# Patient Record
Sex: Female | Born: 1945
Health system: Southern US, Community
[De-identification: ages and names within clinical notes are randomized; demographics above are authoritative.]

## PROBLEM LIST (undated history)

## (undated) DIAGNOSIS — R569 Unspecified convulsions: Secondary | ICD-10-CM

## (undated) DIAGNOSIS — E785 Hyperlipidemia, unspecified: Secondary | ICD-10-CM

## (undated) DIAGNOSIS — F329 Major depressive disorder, single episode, unspecified: Secondary | ICD-10-CM

## (undated) DIAGNOSIS — F17200 Nicotine dependence, unspecified, uncomplicated: Secondary | ICD-10-CM

## (undated) DIAGNOSIS — K7689 Other specified diseases of liver: Secondary | ICD-10-CM

## (undated) DIAGNOSIS — F32A Depression, unspecified: Secondary | ICD-10-CM

## (undated) DIAGNOSIS — T7840XA Allergy, unspecified, initial encounter: Secondary | ICD-10-CM

## (undated) DIAGNOSIS — F419 Anxiety disorder, unspecified: Secondary | ICD-10-CM

## (undated) DIAGNOSIS — G40909 Epilepsy, unspecified, not intractable, without status epilepticus: Secondary | ICD-10-CM

## (undated) DIAGNOSIS — M1990 Unspecified osteoarthritis, unspecified site: Secondary | ICD-10-CM

## (undated) DIAGNOSIS — J449 Chronic obstructive pulmonary disease, unspecified: Secondary | ICD-10-CM

## (undated) DIAGNOSIS — D62 Acute posthemorrhagic anemia: Secondary | ICD-10-CM

## (undated) DIAGNOSIS — Z2989 Encounter for other specified prophylactic measures: Secondary | ICD-10-CM

## (undated) DIAGNOSIS — I10 Essential (primary) hypertension: Secondary | ICD-10-CM

## (undated) DIAGNOSIS — M797 Fibromyalgia: Secondary | ICD-10-CM

## (undated) DIAGNOSIS — F1910 Other psychoactive substance abuse, uncomplicated: Secondary | ICD-10-CM

## (undated) DIAGNOSIS — D72829 Elevated white blood cell count, unspecified: Secondary | ICD-10-CM

## (undated) DIAGNOSIS — I69319 Unspecified symptoms and signs involving cognitive functions following cerebral infarction: Secondary | ICD-10-CM

## (undated) DIAGNOSIS — H539 Unspecified visual disturbance: Secondary | ICD-10-CM

## (undated) DIAGNOSIS — N179 Acute kidney failure, unspecified: Secondary | ICD-10-CM

## (undated) DIAGNOSIS — F1010 Alcohol abuse, uncomplicated: Secondary | ICD-10-CM

## (undated) DIAGNOSIS — I219 Acute myocardial infarction, unspecified: Secondary | ICD-10-CM

## (undated) DIAGNOSIS — G709 Myoneural disorder, unspecified: Secondary | ICD-10-CM

## (undated) HISTORY — DX: Acute myocardial infarction, unspecified: I21.9

## (undated) HISTORY — DX: Alcohol abuse, uncomplicated: F10.10

## (undated) HISTORY — DX: Myoneural disorder, unspecified: G70.9

## (undated) HISTORY — DX: Other specified diseases of liver: K76.89

## (undated) HISTORY — DX: Major depressive disorder, single episode, unspecified: F32.9

## (undated) HISTORY — DX: Epilepsy, unspecified, not intractable, without status epilepticus: G40.909

## (undated) HISTORY — DX: Elevated white blood cell count, unspecified: D72.829

## (undated) HISTORY — DX: Anxiety disorder, unspecified: F41.9

## (undated) HISTORY — DX: Nicotine dependence, unspecified, uncomplicated: F17.200

## (undated) HISTORY — DX: Depression, unspecified: F32.A

## (undated) HISTORY — DX: Unspecified visual disturbance: H53.9

## (undated) HISTORY — PX: ELBOW ARTHROPLASTY: SHX928

## (undated) HISTORY — DX: Other psychoactive substance abuse, uncomplicated: F19.10

## (undated) HISTORY — DX: Unspecified convulsions: R56.9

## (undated) HISTORY — DX: Allergy, unspecified, initial encounter: T78.40XA

## (undated) HISTORY — DX: Hyperlipidemia, unspecified: E78.5

## (undated) HISTORY — PX: MOUTH SURGERY: SHX715

---

## 1998-04-23 ENCOUNTER — Encounter: Payer: Self-pay | Admitting: Emergency Medicine

## 1998-04-23 ENCOUNTER — Emergency Department (HOSPITAL_COMMUNITY): Admission: EM | Admit: 1998-04-23 | Discharge: 1998-04-23 | Payer: Self-pay | Admitting: Emergency Medicine

## 1999-04-19 ENCOUNTER — Encounter: Payer: Self-pay | Admitting: Emergency Medicine

## 1999-04-19 ENCOUNTER — Emergency Department (HOSPITAL_COMMUNITY): Admission: EM | Admit: 1999-04-19 | Discharge: 1999-04-19 | Payer: Self-pay

## 2001-03-31 ENCOUNTER — Emergency Department (HOSPITAL_COMMUNITY): Admission: EM | Admit: 2001-03-31 | Discharge: 2001-03-31 | Payer: Self-pay | Admitting: Emergency Medicine

## 2001-03-31 ENCOUNTER — Encounter: Payer: Self-pay | Admitting: Family Medicine

## 2001-03-31 ENCOUNTER — Ambulatory Visit (HOSPITAL_COMMUNITY): Admission: RE | Admit: 2001-03-31 | Discharge: 2001-03-31 | Payer: Self-pay | Admitting: Family Medicine

## 2001-04-08 ENCOUNTER — Ambulatory Visit (HOSPITAL_COMMUNITY): Admission: RE | Admit: 2001-04-08 | Discharge: 2001-04-08 | Payer: Self-pay | Admitting: Family Medicine

## 2001-04-08 ENCOUNTER — Encounter: Payer: Self-pay | Admitting: Family Medicine

## 2001-10-24 ENCOUNTER — Encounter: Payer: Self-pay | Admitting: Family Medicine

## 2001-10-24 ENCOUNTER — Ambulatory Visit (HOSPITAL_COMMUNITY): Admission: RE | Admit: 2001-10-24 | Discharge: 2001-10-24 | Payer: Self-pay | Admitting: Family Medicine

## 2001-12-23 ENCOUNTER — Inpatient Hospital Stay (HOSPITAL_COMMUNITY): Admission: EM | Admit: 2001-12-23 | Discharge: 2001-12-25 | Payer: Self-pay | Admitting: Emergency Medicine

## 2001-12-23 ENCOUNTER — Encounter: Payer: Self-pay | Admitting: Emergency Medicine

## 2001-12-24 ENCOUNTER — Encounter: Payer: Self-pay | Admitting: Internal Medicine

## 2002-03-24 ENCOUNTER — Emergency Department (HOSPITAL_COMMUNITY): Admission: EM | Admit: 2002-03-24 | Discharge: 2002-03-24 | Payer: Self-pay | Admitting: Emergency Medicine

## 2002-04-21 ENCOUNTER — Ambulatory Visit (HOSPITAL_COMMUNITY): Admission: RE | Admit: 2002-04-21 | Discharge: 2002-04-21 | Payer: Self-pay | Admitting: Family Medicine

## 2002-04-21 ENCOUNTER — Encounter: Payer: Self-pay | Admitting: Family Medicine

## 2003-01-14 ENCOUNTER — Emergency Department (HOSPITAL_COMMUNITY): Admission: EM | Admit: 2003-01-14 | Discharge: 2003-01-14 | Payer: Self-pay | Admitting: Emergency Medicine

## 2003-01-29 ENCOUNTER — Encounter: Payer: Self-pay | Admitting: *Deleted

## 2003-01-29 ENCOUNTER — Encounter (HOSPITAL_COMMUNITY): Admission: RE | Admit: 2003-01-29 | Discharge: 2003-02-28 | Payer: Self-pay | Admitting: *Deleted

## 2003-05-11 ENCOUNTER — Ambulatory Visit (HOSPITAL_COMMUNITY): Admission: RE | Admit: 2003-05-11 | Discharge: 2003-05-11 | Payer: Self-pay | Admitting: Unknown Physician Specialty

## 2003-05-11 ENCOUNTER — Encounter: Payer: Self-pay | Admitting: Unknown Physician Specialty

## 2003-05-24 ENCOUNTER — Emergency Department (HOSPITAL_COMMUNITY): Admission: EM | Admit: 2003-05-24 | Discharge: 2003-05-24 | Payer: Self-pay | Admitting: Emergency Medicine

## 2003-06-11 ENCOUNTER — Emergency Department (HOSPITAL_COMMUNITY): Admission: EM | Admit: 2003-06-11 | Discharge: 2003-06-11 | Payer: Self-pay | Admitting: Emergency Medicine

## 2003-07-26 ENCOUNTER — Emergency Department (HOSPITAL_COMMUNITY): Admission: EM | Admit: 2003-07-26 | Discharge: 2003-07-26 | Payer: Self-pay | Admitting: Emergency Medicine

## 2004-05-24 ENCOUNTER — Ambulatory Visit (HOSPITAL_COMMUNITY): Admission: RE | Admit: 2004-05-24 | Discharge: 2004-05-24 | Payer: Self-pay | Admitting: Unknown Physician Specialty

## 2005-06-29 ENCOUNTER — Ambulatory Visit (HOSPITAL_COMMUNITY): Admission: RE | Admit: 2005-06-29 | Discharge: 2005-06-29 | Payer: Self-pay | Admitting: Emergency Medicine

## 2005-08-20 DIAGNOSIS — I219 Acute myocardial infarction, unspecified: Secondary | ICD-10-CM

## 2005-08-20 HISTORY — DX: Acute myocardial infarction, unspecified: I21.9

## 2005-12-13 ENCOUNTER — Emergency Department (HOSPITAL_COMMUNITY): Admission: EM | Admit: 2005-12-13 | Discharge: 2005-12-13 | Payer: Self-pay | Admitting: Emergency Medicine

## 2006-07-08 ENCOUNTER — Ambulatory Visit (HOSPITAL_COMMUNITY): Admission: RE | Admit: 2006-07-08 | Discharge: 2006-07-08 | Payer: Self-pay | Admitting: Emergency Medicine

## 2006-10-15 ENCOUNTER — Emergency Department (HOSPITAL_COMMUNITY): Admission: EM | Admit: 2006-10-15 | Discharge: 2006-10-15 | Payer: Self-pay | Admitting: Family Medicine

## 2007-05-27 ENCOUNTER — Ambulatory Visit (HOSPITAL_COMMUNITY): Admission: RE | Admit: 2007-05-27 | Discharge: 2007-05-27 | Payer: Self-pay | Admitting: Internal Medicine

## 2007-08-11 ENCOUNTER — Ambulatory Visit (HOSPITAL_COMMUNITY): Admission: RE | Admit: 2007-08-11 | Discharge: 2007-08-11 | Payer: Self-pay | Admitting: Internal Medicine

## 2007-08-11 ENCOUNTER — Encounter (HOSPITAL_COMMUNITY): Admission: RE | Admit: 2007-08-11 | Discharge: 2007-08-20 | Payer: Self-pay | Admitting: Internal Medicine

## 2008-05-03 ENCOUNTER — Other Ambulatory Visit: Admission: RE | Admit: 2008-05-03 | Discharge: 2008-05-03 | Payer: Self-pay | Admitting: Internal Medicine

## 2008-12-15 ENCOUNTER — Ambulatory Visit (HOSPITAL_COMMUNITY): Admission: RE | Admit: 2008-12-15 | Discharge: 2008-12-15 | Payer: Self-pay | Admitting: Internal Medicine

## 2010-03-30 ENCOUNTER — Ambulatory Visit (HOSPITAL_COMMUNITY): Admission: RE | Admit: 2010-03-30 | Discharge: 2010-03-30 | Payer: Self-pay | Admitting: Gastroenterology
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
## 2011-01-05 NOTE — Consult Note (Signed)
[facility]  Patient:    AMBUR, PROVINCE Visit Number: [id number] MRN: [id number]          Service Type: MED Location: 2A A228 01 Attending Physician:  Renne Musca Dictated by:   Beryle Beams, M.D. Proc. Date: 12/24/01 Admit Date:  12/23/2001 Discharge Date: 12/25/2001                            Consultation Report  IMPRESSION: 1. I suspect that her severe episodic headaches are causing her fainting    spells.  I doubt these spells are epileptic in nature. 2. Likely significant obstructive sleep apnea syndrome. 3. Insomnia in part due to significant apnea.  RECOMMENDATION: 1. Elavil for insomnia and headache prophylaxis. 2. Sleep study. 3. Consider EEG if the above is unrevealing.  HISTORY:  This is a 65 year old African-American female who reports the onset of history of headaches.  She apparently also has had syncopal/fainting spells 3 years ago.  She was evaluated at [facility] and also had an EEG. Apparently the work-up was unrevealing.  The spells resolved on their own, however, they have returned recently.  She characterizes the semeiology as starting with severe headaches.  Everything then becomes blank and then she passes out.  She is only out for a few seconds.  The spells are not associated with any stiffening or clonic activity.  There is no oral trauma, no urinary incontinence.  She does have a history of closed head injury which was mild most consistent with concussive syndrome.  No intracranial abnormalities.  No history of CNS infections.  She has had syphilis many years ago but was treated twice for this.  She reports her headaches as occurring in the bifrontal, bitemporal region. They can last all day long.  She is taking a lot of short acting analgesics for her headache.  These include aspirin, Motrin, aleve.  Headaches are associated with photophobia and phonophobia.  They are not associated with nausea or  emesis.  They are associated with blurred vision and spots in her vision at times.  There is a family history of headaches and also apparently a son that also has seizures.  PAST MEDICAL HISTORY: 1. Significant for hypertension. 2. Apparently has had chest pain in the past.  CURRENT MEDICATIONS: 1. Norvasc. 2. Xanax. 3. Flexeril 10 mg t.i.d. 4. Protonix. 5. Ambient. 6. Lortab.  SOCIAL HISTORY:  He does admit to using marijuana episodically from time to time.  Does smoke half a pack of cigarettes per day.  Also drinks alcoholic beverages socially.  REVIEW OF SYSTEMS:  As stated in history of present illness.   She apparently does not report a significant history of insomnia.  She has taken Elavil for headache prophylaxis for insomnia.  She apparently has been on Ambien.  She does report a history of day time sleepiness.  She has been told that she snores quite a bit and this is quite disruptive to her family.  PHYSICAL EXAMINATION:  GENERAL:  Moderately overweight lady.  She is no acute distress.  VITAL SIGNS:  Blood pressure 140/80, pulse 66, respirations 12.  She is alert and oriented.  HEENT:  Evaluation shows a very tight retropalatal space and a large tongue, significant.  She does have a significant overbite approximately 10 mm.  EXTREMITIES:  No edema.  NEUROLOGIC:  Cranial nerves II-XII intact including the visual fields.  Motor examination shows normal tone, bulk and strength.  There  is no pronator drift.  Reflexes of ______ and plantar reflexes are both downgoing.  Coordination is intact.  Sensory examination normal to temperature and light touch and also double simultaneous stimulation.  Gait is normal. Dictated by:   Beryle Beams, M.D. Attending Physician:  Renne Musca DD:  12/24/01 TD:  12/26/01 Job: [id number]
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
## 2011-01-05 NOTE — Consult Note (Signed)
   NAME:  Jennifer Jacobs, Jennifer Jacobs                          ACCOUNT NO.:  [id number]   MEDICAL RECORD NO.:  [id number]                   PATIENT TYPE:  EMS   LOCATION:  ED                                   FACILITY:  [facility]   PHYSICIAN:  Tilda Burrow, M.D.              DATE OF BIRTH:  05/15/1946   DATE OF CONSULTATION:  DATE OF DISCHARGE:  03/24/2002                                OB/GYN CONSULTATION   EMERGENCY ROOM CONSULTATION:   CHIEF COMPLAINT:  Swelling in the vaginal area.   HISTORY OF PRESENT ILLNESS:  This 65 year old female, followed frequently,  is seen in the emergency room for severe swelling and discomfort on the left  side of the vaginal entrance.   Additionally she has complaints of an area that is tender and drains near  the rectum.   PHYSICAL EXAMINATION:   FOCUSED EXAM:  Exam shows 3 abnormalities.  Firstly a large 3-4 cm. swollen,  mildly inflamed, Bartholin's abscess on the left labia majora and an  additional perianal draining sinus which has been of chronic nature and  occasionally tries to close over, then swells, becomes tender and ruptures  again.  This has been on long-standing therapy.  She has an external  hemorrhoidal tag, external skin tag.   EMERGENCY ROOM COURSE:  The patient was seen, examined, counseled and I&D of  the left Bartholin's abscess performed by local anesthesia, placement of a  Word catheter after satisfactory drainage.  The procedure was tolerated well  with the patient having received 75 mg of Demerol and 25 of Phenergan prior  to procedure.  ]   DISPOSITION:  The patient will be followed up in 1 week at the [facility]  for catheter removal.  The patient is encouraged to call at this time for a  follow up appointment in approximately 1 month.  She is also advised to have  evaluation of the perianal abscess as well.   If the patient is unable to be seen at the [facility] she is advised to  follow up in our office in approximately 1  month for Word catheter removal.                                               Tilda Burrow, M.D.    JVF/MEDQ  D:  03/24/2002  T:  03/29/2002  Job:  [id number]   cc:   [facility]

## 2011-01-05 NOTE — Group Therapy Note (Signed)
   NAME:  Jennifer Jacobs, Jennifer Jacobs                          ACCOUNT NO.:  [id number]   MEDICAL RECORD NO.:  [id number]                   PATIENT TYPE:  PREC   LOCATION:                                       FACILITY:  [facility]   PHYSICIAN:  Vida Roller, M.D.                DATE OF BIRTH:  July 19, 1946   DATE OF PROCEDURE:  01/29/2003  DATE OF DISCHARGE:  01/14/2003                                    STRESS TEST   PROCEDURE:  Exercise Cardiolite   INDICATIONS:  [name] is a 65 year old female with no known coronary artery  disease who presents with a 2-year history of atypical chest discomfort  associated with multiple episodes of syncope.  Her cardiac risk factors  include tobacco abuse, hypertension, and family history.   BASELINE DATA:  EKG shows sinus rhythm at 57 beats/minute, nonspecific ST  abnormalities. Blood pressure is 140/70.   The patient exercised for a total of 5 minutes and 56 seconds to Bruce  protocol stage 2 and 7.0 METS.  Maximum heart rate achieved was 145  beats/minute which is 87% of predicted maximum heart rate.  Maximum blood  pressure was 158/88.  The patient reported mild shortness of breath towards  the end of exercise which resolved in recovery.   EKG showed no ischemic changes and no arrhythmias.   Final images and results are pending MD review.     Amy Mercy Riding, P.A. [facility]                     Vida Roller, M.D.    AB/MEDQ  D:  01/29/2003  T:  01/29/2003  Job:  [id number]

## 2011-05-25 ENCOUNTER — Inpatient Hospital Stay (INDEPENDENT_AMBULATORY_CARE_PROVIDER_SITE_OTHER)
Admission: RE | Admit: 2011-05-25 | Discharge: 2011-05-25 | Disposition: A | Discharge status: Home / Self Care | Payer: Self-pay | Source: Ambulatory Visit | Attending: Family Medicine | Admitting: Family Medicine

## 2011-05-25 DIAGNOSIS — M940 Chondrocostal junction syndrome [Tietze]: Secondary | ICD-10-CM

## 2011-05-25 DIAGNOSIS — R071 Chest pain on breathing: Secondary | ICD-10-CM

## 2012-01-21 ENCOUNTER — Encounter (HOSPITAL_COMMUNITY): Payer: Self-pay | Admitting: *Deleted

## 2012-01-21 ENCOUNTER — Emergency Department (HOSPITAL_COMMUNITY): Payer: No Typology Code available for payment source

## 2012-01-21 ENCOUNTER — Other Ambulatory Visit: Payer: Self-pay

## 2012-01-21 ENCOUNTER — Encounter (HOSPITAL_COMMUNITY): Payer: Self-pay | Admitting: Emergency Medicine

## 2012-01-21 ENCOUNTER — Emergency Department (HOSPITAL_COMMUNITY)
Admission: EM | Admit: 2012-01-21 | Discharge: 2012-01-21 | Disposition: A | Discharge status: Home / Self Care | Payer: No Typology Code available for payment source | Attending: Emergency Medicine | Admitting: Emergency Medicine

## 2012-01-21 DIAGNOSIS — M25559 Pain in unspecified hip: Secondary | ICD-10-CM | POA: Insufficient documentation

## 2012-01-21 DIAGNOSIS — M549 Dorsalgia, unspecified: Secondary | ICD-10-CM | POA: Insufficient documentation

## 2012-01-21 DIAGNOSIS — M542 Cervicalgia: Secondary | ICD-10-CM | POA: Insufficient documentation

## 2012-01-21 DIAGNOSIS — M25569 Pain in unspecified knee: Secondary | ICD-10-CM | POA: Insufficient documentation

## 2012-01-21 DIAGNOSIS — R404 Transient alteration of awareness: Secondary | ICD-10-CM | POA: Insufficient documentation

## 2012-01-21 DIAGNOSIS — T1490XA Injury, unspecified, initial encounter: Secondary | ICD-10-CM | POA: Insufficient documentation

## 2012-01-21 DIAGNOSIS — R079 Chest pain, unspecified: Secondary | ICD-10-CM | POA: Insufficient documentation

## 2012-01-21 HISTORY — DX: Unspecified osteoarthritis, unspecified site: M19.90

## 2012-01-21 HISTORY — DX: Essential (primary) hypertension: I10

## 2012-01-21 HISTORY — DX: Fibromyalgia: M79.7

## 2012-01-21 MED ORDER — IBUPROFEN 800 MG PO TABS: 800.0000 mg | ORAL_TABLET | Freq: Three times a day (TID) | ORAL | Status: AC

## 2012-01-21 MED ORDER — MELOXICAM 7.5 MG PO TABS: 7.5000 mg | ORAL_TABLET | Freq: Two times a day (BID) | ORAL | Status: DC | PRN

## 2012-01-21 MED ORDER — DIAZEPAM 5 MG PO TABS: 5.0000 mg | ORAL_TABLET | Freq: Every day | ORAL | Status: AC

## 2012-01-21 NOTE — ED Notes (Signed)
Pt returned from X-Ray/CT Scan.Marland Kitchen
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
## 2012-01-21 NOTE — ED Notes (Signed)
Patient transported to CT/XRay.
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
## 2012-01-21 NOTE — ED Notes (Signed)
Pt remains in X-Ray/CT Scan, family at bedside.

## 2013-03-18 ENCOUNTER — Encounter: Payer: Self-pay | Admitting: Cardiology

## 2013-03-18 ENCOUNTER — Ambulatory Visit (INDEPENDENT_AMBULATORY_CARE_PROVIDER_SITE_OTHER): Payer: BC Managed Care – PPO | Admitting: Cardiology

## 2013-03-18 VITALS — BP 138/90 | HR 59 | Ht 68.5 in | Wt 177.8 lb

## 2013-03-18 DIAGNOSIS — E785 Hyperlipidemia, unspecified: Secondary | ICD-10-CM

## 2013-03-18 DIAGNOSIS — R079 Chest pain, unspecified: Secondary | ICD-10-CM

## 2013-03-18 DIAGNOSIS — I739 Peripheral vascular disease, unspecified: Secondary | ICD-10-CM

## 2013-03-18 DIAGNOSIS — F17200 Nicotine dependence, unspecified, uncomplicated: Secondary | ICD-10-CM | POA: Insufficient documentation

## 2013-03-18 DIAGNOSIS — I10 Essential (primary) hypertension: Secondary | ICD-10-CM

## 2013-03-18 DIAGNOSIS — R0600 Dyspnea, unspecified: Secondary | ICD-10-CM

## 2013-03-18 DIAGNOSIS — R42 Dizziness and giddiness: Secondary | ICD-10-CM

## 2013-03-18 DIAGNOSIS — R0609 Other forms of dyspnea: Secondary | ICD-10-CM

## 2013-03-18 NOTE — Assessment & Plan Note (Signed)
The sounds liver orthostatic versus vertigo.  We'll see her echo looks like as far as filling pressures.  Also reluctant to increase her blood pressure control if it is orthostatic in nature.

## 2013-03-18 NOTE — Assessment & Plan Note (Signed)
On statin, followup primary physician.

## 2013-03-18 NOTE — Progress Notes (Addendum)
Patient ID: Jennifer Jacobs, female   DOB: 24-Oct-1945, 66 y.o.   MRN: [id number]  PCP:  [facility], Dr. Lerry Liner Clinic Note: Chief Complaint  Patient presents with  . New Patient    ref from [facility]; CP x 3-4 days assoicated with SOB, lightheaded/dizzy; bilat LE edema    HPI: Jennifer Jacobs is a 67 y.o. female with a PMH below who presents today for evaluation of 3-4 days with chest pain as well as dyspnea and edema.  Interval History: She has hypertension and dyslipidemia, and was up nearly evaluated 2007 to pathology a mild MI.  No invasive evaluation done as far as tell no stress test is in the [facility] record.  This is was performed at [facility].  Her major complaint is of pain started in bilateral shoulders about to 4 days ago.  It occurred at rest and is present consistent and constant since.  He is made worse with moving of her shoulders and deep inspiration.  It is now radiating down into her chest along the right side of her sternum.  Coughing a deep respiration doesn't get worse.  It is not made worse with exertion.  It is a sharp stabbing type pain involving both sides of the neck and shoulder.  Besides this, she also notes intermittently feeling lightheaded and dizzy and has mild bilateral lower extremity edema.  However more concerning lesion she notes that for about a year or so she's been noticing having shortness of breath that is at baseline there, but does get worse with exertion.  He has a mild orthopnea, but does describe symptoms that may be PND with having to wake up sometimes goes to the bathroom a glass of water her face gets her breath.  She denies any syncope or near-syncope but does have some lightheadedness and wooziness this sounds almost positional in how she describes it. She notes being somewhat sleepy during the course of the day and has been told by her husband that she snores quite a bit.  She does note that she avoids  certain physical activities to the CCU in the past.  She does say that she will get short of breath with minimal exertion.  She PTs easily and does note palpitations albeit not minor.  She does have family history coronary disease that she can tell.  She has hypertension, she is current smoker and lives with a smoker.    She denies any melena, hematochezia or hematuria.  She does describe some leg pain and the angulation that may be consistent with claudication symptoms.  No severe near-syncope.  No TIA or amaurosis fugax symptoms.  Past Medical History  Diagnosis Date  . Arthritis   . Hypertension   . Fibromyalgia   . MI (myocardial infarction) 2007    Reportedly - Had TM ST with ?? results, Pt reports.  . Smoker unmotivated to quit     Quit for 3 years and then restarted  . Dyslipidemia     Prior Cardiac Evaluation and Past Surgical History: Past Surgical History  Procedure Laterality Date  . Arm surgery      Allergies  Allergen Reactions  . Aspirin Nausea Only    Current Outpatient Prescriptions  Medication Sig Dispense Refill  . lamoTRIgine (LAMICTAL) 100 MG tablet Take 50 mg by mouth daily.      Marland Kitchen lisinopril-hydrochlorothiazide (PRINZIDE,ZESTORETIC) 20-12.5 MG per tablet Take 1 tablet by mouth daily.      Marland Kitchen  meloxicam (MOBIC) 7.5 MG tablet Take 1 tablet (7.5 mg total) by mouth 2 (two) times daily as needed for pain. For inflammation  15 tablet  0  . metoprolol tartrate (LOPRESSOR) 25 MG tablet Take 25 mg by mouth 2 (two) times daily.      Marland Kitchen omeprazole (PRILOSEC) 20 MG capsule Take 20 mg by mouth daily.      . pravastatin (PRAVACHOL) 40 MG tablet Take 40 mg by mouth every evening.      . traZODone (DESYREL) 100 MG tablet Take 100 mg by mouth at bedtime.       No current facility-administered medications for this visit.    History   Social History  . Marital Status: Legally Separated    Spouse Name: N/A    Number of Children: 4  . Years of Education: N/A    Occupational History  .     Social History Main Topics  . Smoking status: Current Every Day Smoker -- 0.10 packs/day    Types: Cigarettes  . Smokeless tobacco: Never Used     Comment: quit for 3 years, started back 6 months ago  . Alcohol Use: Yes     Comment: 1 pint (gin)/week  . Drug Use: No  . Sexually Active: Not on file   Other Topics Concern  . Not on file   Social History Narrative  . No narrative on file   Family History  Problem Relation Age of Onset  . Heart attack Father   . Heart attack Maternal Grandmother   . Cancer Maternal Grandfather   . Cancer Paternal Grandmother    ROS: A comprehensive Review of Systems - Negative except Pertinent positives noted above.  Other noncardiac symptoms noted below General ROS: positive for  - fatigue and sleep disturbance ENT ROS: positive for - vertigo and Dizziness Hematological and Lymphatic ROS: positive for - fatigue negative for - bleeding problems, blood clots, blood transfusions, jaundice or pallor Gastrointestinal ROS: no abdominal pain, change in bowel habits, or black or bloody stools Musculoskeletal ROS: Positive position bilateral shoulder and neck pain as indicated above.  PHYSICAL EXAM BP 138/90  Pulse 59  Ht 5' 8.5" (1.74 m)  Wt 177 lb 12.8 oz (80.65 kg)  BMI 26.64 kg/m2 General appearance: alert, cooperative, appears stated age and mild truncal obesity.  She readily admits this to her level of comprehension is not very good.  She usually has her daughter here with her to help out with expirations she available his questions appropriately.  Is not really comprehend more detailed questions.  She is otherwise well groomed and in no acute distress. HEENT: [place]/AT, EOMI, MMM, anicteric sclera Neck: no carotid bruit, no JVD and supple, symmetrical, trachea midline Lungs: clear to auscultation bilaterally, normal percussion bilaterally and nonlabored, good movement Heart: regular rate and rhythm, S1, S2 normal, no  murmur, click, rub or gallop and normal apical impulse; palpation along the bilateral costochondral margin reveals exquisite tenderness and pain to palpation in the third or fourth rib costochondral junction.  This causes peripheral pain up the rib toward the axilla.  This exactly reproduces her symptoms. Abdomen: soft, non-tender; bowel sounds normal; no masses,  no organomegaly Extremities: extremities normal, atraumatic, no cyanosis with trace edema and no venous stasis changes Pulses: 2+ and symmetric bilateral radial arteries but somewhat diminished pedal pulses. Neurologic: Mental status: Alert, oriented, thought content appropriate, quite slow with apprehension Cranial nerves: normal; just due to how her face is configured, has appearance of a  left-sided facial droop.  This is if anything a very subtle finding.  [id number] today: Yes Rate: 59 , Rhythm: Sinus bradycardia, nonspecific ST changes, fracture deviation.  Otherwise normal   Recent Labs: None recently checked   Epworth Sleepiness Scale: Situation   Chance of Dozing/Sleeping (0 = never , 1 = slight chance , 2 = moderate chance , 3 = high chance )   sitting and reading  1    watching TV  3    sitting inactive in a public place  1    being a passenger in a motor vehicle for an hour or more  3    lying down in the afternoon  3    sitting and talking to someone  0    sitting quietly after lunch (no alcohol)  0    while stopped for a few minutes in traffic as the driver  2-3    Total Score   13-14     ASSESSMENT/PLAN: Chest pain, musculoskeletal; costochondritis Pain on the right side of the chest is reproducible on exam associated with significant muscle tightness and discomfort in her shoulders.  This is most likely musculoskeletal/costochondritis in nature.  She has history of fibromyalgia, this may very well be related.  Not anginal pain.  Despite that, I am more concerned with her dyspnea on exertion and will evaluate  her with with a stress test to clarify the chest discomfort.  Dyspnea on exertion Exertional dyspnea patient did with moderate truncal obesity albeit not diagnostic for obesity, hypertension and smoking history.  This could be multifactorial.  However I am concerned with the exertional nature of her symptoms that she says is getting worse over last couple months.  Note having symptoms of PND and orthopnea, wonder if she did have elevated filling pressures of diastolic dysfunction related possibly systolic dysfunction versus even pulmonary hypertension.  Also consider could this be due to micro-or microvascular ischemia.  Plan: 2-D echocardiogram, CPET-MET test. Would consider Myoview or cath if CPET is grossly abnormal.  PND (paroxysmal nocturnal dyspnea) Not classic symptoms of PND, however with other complaints, we'll then proceed with echocardiography.  Hypertension She continues to have diastolic hypertension here.  She says it is not like this at home.  We'll see what his comes in for CPET test.  No room to increase beta blocker.  4 increasing lisinopril/HCTZ to max dose, would like to see additional blood pressure readings.  Postural dizziness The sounds liver orthostatic versus vertigo.  We'll see her echo looks like as far as filling pressures.  Also reluctant to increase her blood pressure control if it is orthostatic in nature.  Dyslipidemia On statin, followup primary physician.  Claudication, class I Appendix if this symptom and her next visit.  We'll see she is bothered by the bicycle test on CPET.   Discuss referral to Dr. Tresa Endo for Sleep Study, given Epworth Scale of 13-14   Orders Placed This Encounter  Procedures  . CPET WITH PFT (MET TEST)    Standing Status: Future     Number of Occurrences:      Standing Expiration Date: 03/18/2014  . EKG 12-Lead  . 2D Echocardiogram with contrast    Standing Status: Future     Number of Occurrences:      Standing Expiration  Date: 03/18/2014    Order Specific Question:  Type of Echo    Answer:  Complete    Order Specific Question:  Where should this test be performed  Answer:  [facility]    Order Specific Question:  Reason for exam-Echo    Answer:  Dyspnea  786.09   Followup: One month  DAVID W. Herbie Baltimore, M.D., M.S. [facility] [address] [place], [place]  [address]  [phone] Pager # [phone]

## 2013-03-18 NOTE — Assessment & Plan Note (Signed)
She continues to have diastolic hypertension here.  She says it is not like this at home.  We'll see what his comes in for CPET test.  No room to increase beta blocker.  4 increasing lisinopril/HCTZ to max dose, would like to see additional blood pressure readings.

## 2013-03-18 NOTE — Patient Instructions (Addendum)
It was a pleasure meeting you today. The discomfort you're having your chest and shoulders is most likely not related to your heart.  Is probably musculoskeletal which means that neck part is probably due to muscle discomfort but that pain in the chest is inflammation at the site where your ribs join your breastbone.  We call this condition costochondritis. Would recommend treating this with taking Aleve or ibuprofen 2-3 times a day for least for 5 days, making sure you take it with food on her stomach and drink plenty of fluids.  What concerns him more is the shortness of breath you have is lying down flat and walking around.  With you having cholesterol and high blood pressure, as well as some mild swelling in legs and worried that maybe your heart function is either not appropriate either the pumping or relaxing mode.  I am also wondering if maybe you could have some heart artery disease either in the large vessels or small vessels.  To evaluate this and we plan to do 2 studies: An echocardiogram and a bicycle stress test call us CPET-MET that is a physiologic study of your heart function with exercise while pedaling a bicycle  Your physician has requested that you have an echocardiogram. Echocardiography is a painless test that uses sound waves to create images of your heart. It provides your doctor with information about the size and shape of your heart and how well your heart's chambers and valves are working. This procedure takes approximately one hour. There are no restrictions for this procedure.   Your physician recommends that you schedule a follow-up appointment after test completed

## 2013-03-18 NOTE — Assessment & Plan Note (Signed)
Appendix if this symptom and her next visit.  We'll see she is bothered by the bicycle test on CPET.

## 2013-03-18 NOTE — Assessment & Plan Note (Signed)
Not classic symptoms of PND, however with other complaints, we'll then proceed with echocardiography.

## 2013-03-18 NOTE — Assessment & Plan Note (Signed)
Pain on the right side of the chest is reproducible on exam associated with significant muscle tightness and discomfort in her shoulders.  This is most likely musculoskeletal/costochondritis in nature.  She has history of fibromyalgia, this may very well be related.  Not anginal pain.  Despite that, I am more concerned with her dyspnea on exertion and will evaluate her with with a stress test to clarify the chest discomfort.

## 2013-03-18 NOTE — Assessment & Plan Note (Signed)
Exertional dyspnea patient did with moderate truncal obesity albeit not diagnostic for obesity, hypertension and smoking history.  This could be multifactorial.  However I am concerned with the exertional nature of her symptoms that she says is getting worse over last couple months.  Note having symptoms of PND and orthopnea, wonder if she did have elevated filling pressures of diastolic dysfunction related possibly systolic dysfunction versus even pulmonary hypertension.  Also consider could this be due to micro-or microvascular ischemia.  Plan: 2-D echocardiogram, CPET-MET test. Would consider Myoview or cath if CPET is grossly abnormal.

## 2013-03-25 ENCOUNTER — Ambulatory Visit (HOSPITAL_COMMUNITY)
Admission: RE | Admit: 2013-03-25 | Discharge: 2013-03-25 | Disposition: A | Discharge status: Home / Self Care | Payer: BC Managed Care – PPO | Source: Ambulatory Visit | Attending: Cardiovascular Disease | Admitting: Cardiovascular Disease

## 2013-03-25 DIAGNOSIS — R079 Chest pain, unspecified: Secondary | ICD-10-CM

## 2013-03-25 DIAGNOSIS — R0609 Other forms of dyspnea: Secondary | ICD-10-CM | POA: Insufficient documentation

## 2013-03-25 DIAGNOSIS — R0989 Other specified symptoms and signs involving the circulatory and respiratory systems: Secondary | ICD-10-CM | POA: Insufficient documentation

## 2013-03-25 DIAGNOSIS — I079 Rheumatic tricuspid valve disease, unspecified: Secondary | ICD-10-CM | POA: Insufficient documentation

## 2013-03-25 DIAGNOSIS — R0600 Dyspnea, unspecified: Secondary | ICD-10-CM

## 2013-03-25 DIAGNOSIS — R42 Dizziness and giddiness: Secondary | ICD-10-CM | POA: Insufficient documentation

## 2013-03-25 HISTORY — PX: TRANSTHORACIC ECHOCARDIOGRAM: SHX275

## 2013-03-25 NOTE — Progress Notes (Signed)
2D Echo Performed 03/25/2013    Clearence Ped, RCS

## 2013-03-27 ENCOUNTER — Encounter: Payer: Self-pay | Admitting: Cardiology

## 2013-04-27 ENCOUNTER — Encounter (INDEPENDENT_AMBULATORY_CARE_PROVIDER_SITE_OTHER): Payer: BC Managed Care – PPO

## 2013-04-27 DIAGNOSIS — R0602 Shortness of breath: Secondary | ICD-10-CM

## 2013-04-27 DIAGNOSIS — R079 Chest pain, unspecified: Secondary | ICD-10-CM

## 2013-04-27 DIAGNOSIS — R0600 Dyspnea, unspecified: Secondary | ICD-10-CM

## 2013-04-27 HISTORY — PX: OTHER SURGICAL HISTORY: SHX169

## 2013-05-07 ENCOUNTER — Encounter: Payer: Self-pay | Admitting: Cardiology

## 2013-05-07 ENCOUNTER — Ambulatory Visit (INDEPENDENT_AMBULATORY_CARE_PROVIDER_SITE_OTHER): Payer: BC Managed Care – PPO | Admitting: Cardiology

## 2013-05-07 VITALS — BP 122/82 | HR 63 | Ht 68.5 in | Wt 172.0 lb

## 2013-05-07 DIAGNOSIS — I10 Essential (primary) hypertension: Secondary | ICD-10-CM

## 2013-05-07 DIAGNOSIS — F17200 Nicotine dependence, unspecified, uncomplicated: Secondary | ICD-10-CM

## 2013-05-07 DIAGNOSIS — R0600 Dyspnea, unspecified: Secondary | ICD-10-CM

## 2013-05-07 DIAGNOSIS — R0609 Other forms of dyspnea: Secondary | ICD-10-CM

## 2013-05-07 DIAGNOSIS — I739 Peripheral vascular disease, unspecified: Secondary | ICD-10-CM

## 2013-05-07 DIAGNOSIS — E785 Hyperlipidemia, unspecified: Secondary | ICD-10-CM

## 2013-05-07 DIAGNOSIS — R0789 Other chest pain: Secondary | ICD-10-CM

## 2013-05-08 NOTE — Patient Instructions (Signed)
Your physician has requested that you have a lexiscan myoview. For further information please visit [email or web address] Please follow instruction sheet, as given.       Your physician wants you to follow-up in 4-6 WEEKS.  You will receive a reminder letter in the mail two months in advance. If you don't receive a letter, please call our office to schedule the follow-up appointment.

## 2013-05-09 ENCOUNTER — Encounter: Payer: Self-pay | Admitting: Cardiology

## 2013-05-09 NOTE — Assessment & Plan Note (Signed)
I wondering if some the symptoms she is having may very well be related to sleep apnea. I want to complete the ischemia evaluation, but then will refer to Dr. Tresa Endo for OSA evaluation given her Epworth scale score.

## 2013-05-09 NOTE — Progress Notes (Signed)
Patient ID: MARYAH MARINARO, female   DOB: 02-18-1946, 67 y.o.   MRN: [id number]  PCP:  [facility], Dr. Lerry Liner [facility] Note: Chief Complaint  Patient presents with  . Follow-up    echo and myoview, complains of chest pain, sob, gerd    HPI: DORETHIA JEANMARIE is a 67 y.o. female with a PMH below who presents today for followup evaluation of 3-4 days with chest pain as well as dyspnea and edema.  Please see my initial note for details the.  In the past she was told she had a "heart attack "2007, but has not had any evidence of any workup done. I recently saw her on July 30 to evaluate these symptoms.  She is subsequently had an echocardiogram and a CPET-MET test.  Interval History: Since her last evaluation, she still has exertional dyspnea but she says it occurs when she is "mopping or more in the yard.  She also describes a discomfort in her chest the is really up to the shoulders more the right side than on the left this is probably more consistent with musculoskeletal symptoms.  She notes occasional cough spell at night but really denies any PND, orthopnea or edema. She did the CPET test but felt like she didn't give the best effort because her knees are really bothering her and could not push herself through it.    Cardiovascular ROS: positive for - dyspnea on exertion and And right shoulder/upper chest pain negative for - edema, irregular heartbeat, loss of consciousness, murmur, orthopnea, palpitations, paroxysmal nocturnal dyspnea, rapid heart rate or shortness of breath, lightheadedness/dizziness, syncope/near syncope. Denies any TIA or amaurosis fugax symptoms.  No melena, hematochezia or hematuria.  Past Medical History  Diagnosis Date  . Arthritis   . Hypertension   . Fibromyalgia   . MI (myocardial infarction) 2007    Reportedly - Had TM ST with ?? results, Pt reports.  . Smoker unmotivated to quit     Quit for 3 years and then restarted  . Dyslipidemia     Prior Cardiac Evaluation and Past Surgical History: Past Surgical History  Procedure Laterality Date  . Transthoracic echocardiogram  03/25/2013    EF 55-60%, no regional wall motion abnormalities; normal diastolic parameters, normal pulmonary pressures.  No valvular lesions noted.  Essentially normal echo   . Exercise tolerance test  04/27/2013    CPET-MET: Submaximal effort (0.96, with goal of greater than 1.09) -- patient states that her effort was limited due to knee pain.  This makes the rest of the interpretation difficult: Peak VO2 - 10.5 (59% moderate), peak 02-pulse -  7..11 (low); heart rate 114 (73% -chronic incompetence considered);; PFTs normal     Allergies  Allergen Reactions  . Aspirin Nausea Only    Current Outpatient Prescriptions  Medication Sig Dispense Refill  . aspirin 81 MG tablet Take 81 mg by mouth daily.      Marland Kitchen lamoTRIgine (LAMICTAL) 100 MG tablet Take 50 mg by mouth daily.      Marland Kitchen lisinopril-hydrochlorothiazide (PRINZIDE,ZESTORETIC) 20-12.5 MG per tablet Take 1 tablet by mouth daily.      . meloxicam (MOBIC) 7.5 MG tablet Take 1 tablet (7.5 mg total) by mouth 2 (two) times daily as needed for pain. For inflammation  15 tablet  0  . metoprolol tartrate (LOPRESSOR) 25 MG tablet Take 25 mg by mouth 2 (two) times daily.      Marland Kitchen omeprazole (PRILOSEC) 20 MG capsule Take 20 mg  by mouth daily.      . pravastatin (PRAVACHOL) 40 MG tablet Take 40 mg by mouth every evening.      . traZODone (DESYREL) 100 MG tablet Take 100 mg by mouth at bedtime.       No current facility-administered medications for this visit.    History   Social History  . Marital Status: Legally Separated    Spouse Name: N/A    Number of Children: 4  . Years of Education: N/A   Occupational History  .     Social History Main Topics  . Smoking status: Former Smoker -- 0.10 packs/day    Types: Cigarettes    Quit date: 03/06/2013  . Smokeless tobacco: Never Used  . Alcohol Use: Yes      Comment: 1 pint (gin)/week  . Drug Use: No  . Sexual Activity: Not on file   Other Topics Concern  . Not on file   Social History Narrative   Married for 8 years.  4 children 6 grandchildren.  Lives with her husband.   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  Does drink about 5 alcoholic beverages a week.   Takes Lamictal and trazodone.   Does not exercise due to work and being tired.      Husband: Beverley Fiedler   Daughters: Ane Payment, [place] Ronny Flurry   Family History  Problem Relation Age of Onset  . Heart attack Father   . Heart attack Maternal Grandmother   . Cancer Maternal Grandfather   . Cancer Paternal Grandmother    ROS: A comprehensive Review of Systems - Negative except Pertinent positives noted above.  Other noncardiac symptoms noted below General ROS: positive for  - fatigue and sleep disturbance ENT ROS: positive for - vertigo and Dizziness Hematological and Lymphatic ROS: positive for - fatigue negative for - bleeding problems, blood clots, blood transfusions, jaundice or pallor Gastrointestinal ROS: no abdominal pain, change in bowel habits, or black or bloody stools Musculoskeletal ROS: Positive position bilateral shoulder and neck pain as indicated above.  PHYSICAL EXAM BP 122/82  Pulse 63  Ht 5' 8.5" (1.74 m)  Wt 172 lb (78.019 kg)  BMI 25.77 kg/m2 General appearance: alert, cooperative, appears stated age and mild truncal obesity. She is otherwise well groomed and in no acute distress. HEENT: [place]/AT, EOMI, MMM, anicteric sclera Neck: no carotid bruit, no JVD and supple, symmetrical, trachea midline Lungs: clear to auscultation bilaterally, normal percussion bilaterally and nonlabored, good movement Heart: regular rate and rhythm, S1, S2 normal, no murmur, click, rub or gallop and normal apical impulse; palpation along the bilateral costochondral margin reveals exquisite tenderness and pain to palpation in the third  or fourth rib costochondral junction.  This causes peripheral pain up the rib toward the axilla.  This exactly reproduces her symptoms. Abdomen: soft, non-tender; bowel sounds normal; no masses,  no organomegaly Extremities: extremities normal, atraumatic, no cyanosis with trace edema and no venous stasis changes Pulses: 2+ and symmetric bilateral radial arteries but somewhat diminished pedal pulses.  WUJ:WJXBJYNWG today: Yes Rate: 59 , Rhythm: Sinus bradycardia, nonspecific ST changes, fracture deviation.  Otherwise normal   Recent Labs: None recently checked  Epworth Sleepiness Scale from last visit: Situation   Chance of Dozing/Sleeping (0 = never , 1 = slight chance , 2 = moderate chance , 3 = high chance )   sitting and reading  1    watching TV  3  sitting inactive in a public place  1    being a passenger in a motor vehicle for an hour or more  3    lying down in the afternoon  3    sitting and talking to someone  0    sitting quietly after lunch (no alcohol)  0    while stopped for a few minutes in traffic as the driver  2-3    Total Score   13-14     ASSESSMENT/PLAN: Dyslipidemia She is currently on a statin, and is followed by her primary physician. Would like to see labs from the PCP.  If not I may simply check a set of labs to have in our system. The level of intensity of treatment would be based on results of this stress test.  Claudication, class I I'm not really sure percent history of claudication simply related to her arthritis pain. Consider checking ABIs of Lower Extremity Arterial Dopplers to get a baseline baseline at followup visit.  PND (paroxysmal nocturnal dyspnea) I wondering if some the symptoms she is having may very well be related to sleep apnea. I want to complete the ischemia evaluation, but then will refer to Dr. Tresa Endo for OSA evaluation given her Epworth scale score.  Chest pain, musculoskeletal; costochondritis As I mentioned, and not overly  concerned about the chest discomfort she is having.  This is clearly musculoskeletal  in nature. The reason for the Myoview stress test is more dyspnea on exertion.  Smoker unmotivated to quit I wrote the topic of smoking cessation with her at this time.  But quickly ran into a road block, as she is not interested in discussing it.  Hopefully with the results of the nuclear stress test, I can dovetail that into more discussion of smoking cessation.  DOE (dyspnea on exertion) Unfortunately I was not able to get great data from the CPET test.  While not only concern that her chest discomfort being cardiac in nature, I would like to evaluate the dyspnea as a possible ischemic symptom. Since she is not able to walk on a treadmill, and clearly not able to pedal a bicycle to target heart rate, and likely do a LexiScan Myoview simply because she carries a diagnoses of prior MI.  Hypertension Blood pressure is much better today.  She is on beta blocker as well as ACE inhibitor/HCTZ. Diastolic pressure seems to be improved.  No plans to change her regimen   Discuss referral to Dr. Tresa Endo for Sleep Study, given Epworth Scale of 13-14 when seen in followup   No orders of the defined types were placed in this encounter.   Followup: 4-6 weeks  Locke Barrell W. Herbie Baltimore, M.D., M.S. [facility] [address] [place], [place]  [address]  [phone] Pager # [phone]

## 2013-05-09 NOTE — Assessment & Plan Note (Signed)
She is currently on a statin, and is followed by her primary physician. Would like to see labs from the PCP.  If not I may simply check a set of labs to have in our system. The level of intensity of treatment would be based on results of this stress test.

## 2013-05-09 NOTE — Assessment & Plan Note (Deleted)
Unfortunately I was not able to get great data from the CPET test.  While not only concern that her chest discomfort being cardiac in nature, I would like to evaluate the dyspnea as a possible ischemic symptom. Since she is not able to walk on a treadmill, and clearly not able to pedal a bicycle to target heart rate, and likely do a LexiScan Myoview simply because she carries a diagnoses of prior MI. 

## 2013-05-09 NOTE — Assessment & Plan Note (Signed)
Unfortunately I was not able to get great data from the CPET test.  While not only concern that her chest discomfort being cardiac in nature, I would like to evaluate the dyspnea as a possible ischemic symptom. Since she is not able to walk on a treadmill, and clearly not able to pedal a bicycle to target heart rate, and likely do a LexiScan Myoview simply because she carries a diagnoses of prior MI.

## 2013-05-09 NOTE — Assessment & Plan Note (Signed)
As I mentioned, and not overly concerned about the chest discomfort she is having.  This is clearly musculoskeletal  in nature. The reason for the Myoview stress test is more dyspnea on exertion.

## 2013-05-09 NOTE — Assessment & Plan Note (Signed)
I wrote the topic of smoking cessation with her at this time.  But quickly ran into a road block, as she is not interested in discussing it.  Hopefully with the results of the nuclear stress test, I can dovetail that into more discussion of smoking cessation.

## 2013-05-09 NOTE — Assessment & Plan Note (Signed)
I'm not really sure percent history of claudication simply related to her arthritis pain. Consider checking ABIs of Lower Extremity Arterial Dopplers to get a baseline baseline at followup visit.

## 2013-05-09 NOTE — Assessment & Plan Note (Signed)
Blood pressure is much better today.  She is on beta blocker as well as ACE inhibitor/HCTZ. Diastolic pressure seems to be improved.  No plans to change her regimen

## 2013-05-13 ENCOUNTER — Encounter (HOSPITAL_COMMUNITY): Payer: BC Managed Care – PPO

## 2013-05-19 ENCOUNTER — Ambulatory Visit (HOSPITAL_COMMUNITY)
Admission: RE | Admit: 2013-05-19 | Discharge: 2013-05-19 | Disposition: A | Discharge status: Home / Self Care | Payer: BC Managed Care – PPO | Source: Ambulatory Visit | Attending: Cardiovascular Disease | Admitting: Cardiovascular Disease

## 2013-05-19 DIAGNOSIS — R0609 Other forms of dyspnea: Secondary | ICD-10-CM | POA: Insufficient documentation

## 2013-05-19 DIAGNOSIS — R0989 Other specified symptoms and signs involving the circulatory and respiratory systems: Secondary | ICD-10-CM | POA: Insufficient documentation

## 2013-05-19 DIAGNOSIS — R0600 Dyspnea, unspecified: Secondary | ICD-10-CM

## 2013-05-19 HISTORY — PX: NM MYOVIEW LTD: HXRAD82

## 2013-05-19 MED ORDER — TECHNETIUM TC 99M SESTAMIBI GENERIC - CARDIOLITE
31.7000 | Freq: Once | INTRAVENOUS | Status: AC | PRN
  Administered 2013-05-19: 31.7 via INTRAVENOUS

## 2013-05-19 MED ORDER — TECHNETIUM TC 99M SESTAMIBI GENERIC - CARDIOLITE
10.8000 | Freq: Once | INTRAVENOUS | Status: AC | PRN
  Administered 2013-05-19: 11 via INTRAVENOUS

## 2013-05-19 MED ORDER — REGADENOSON 0.4 MG/5ML IV SOLN
0.4000 mg | Freq: Once | INTRAVENOUS | Status: AC
  Administered 2013-05-19: 0.4 mg via INTRAVENOUS

## 2013-05-19 NOTE — Procedures (Addendum)
[facility] [address] [place] [address] [phone]  Cardiology Nuclear Med Study  Jennifer Jacobs is a 67 y.o. female     MRN : [id number]     DOB: 23-Jul-1946  Procedure Date: 05/19/2013  Nuclear Med Background Indication for Stress Test:  Evaluation for Ischemia History:  MI--2007 Cardiac Risk Factors: Claudication, Family History - CAD, Hypertension, Lipids, Overweight and Smoker  Symptoms:  Chest Pain, Dizziness, DOE, Fatigue, Light-Headedness, Palpitations and SOB   Nuclear Pre-Procedure Caffeine/Decaff Intake:  12:00am NPO After: 11AM   IV Site: R Hand  IV 0.9% NS with Angio Cath:  22g  Chest Size (in):  n/a IV Started by: Emmit Pomfret, RN  Height: 5\' 8"  (1.727 m)  Cup Size: D  BMI:  Body mass index is 25.7 kg/(m^2). Weight:  169 lb (76.658 kg)   Tech Comments:  n/a    Nuclear Med Study 1 or 2 day study: 1 day  Stress Test Type:  Lexiscan  Order Authorizing Provider:  Lollie Marrow   Resting Radionuclide: Technetium 53m Sestamibi  Resting Radionuclide Dose: 10.8 mCi   Stress Radionuclide:  Technetium 59m Sestamibi  Stress Radionuclide Dose: 31.7 mCi           Stress Protocol Rest HR: 64 Stress HR: 88  Rest BP: 114/90 Stress BP: 115/93  Exercise Time (min): n/a METS: n/a   Predicted Max HR: 156 bpm % Max HR: 57.69 bpm Rate Pressure Product: 21308  Dose of Adenosine (mg):  n/a Dose of Lexiscan: 0.4 mg  Dose of Atropine (mg): n/a Dose of Dobutamine: n/a mcg/kg/min (at max HR)  Stress Test Technologist: Esperanza Sheets, CCT Nuclear Technologist: Gonzella Lex, CNMT   Rest Procedure:  Myocardial perfusion imaging was performed at rest 45 minutes following the intravenous administration of Technetium 30m Sestamibi. Stress Procedure:  The patient received IV Lexiscan 0.4 mg over 15-seconds.  Technetium 27m Sestamibi injected at 30-seconds.  There were no significant changes with Lexiscan.   Quantitative spect images were obtained after a 45 minute delay.  Transient Ischemic Dilatation (Normal <1.22):  1.12 Lung/Heart Ratio (Normal <0.45):  0.24 QGS EDV:  47 ml QGS ESV:  10 ml LV Ejection Fraction: 80%  Signed by      Rest ECG: NSR with nondiagnostic T changes  Stress ECG: No significant change from baseline ECG  QPS Raw Data Images:  Normal; no motion artifact; normal heart/lung ratio. Stress Images:  Normal homogeneous uptake in all areas of the myocardium. Rest Images:  Normal homogeneous uptake in all areas of the myocardium. Subtraction (SDS):  Normal  Impression Exercise Capacity:  Lexiscan with no exercise. BP Response:  Normal blood pressure response. Clinical Symptoms:  Mild shortness of breath ECG Impression:  No significant ST segment change suggestive of ischemia. Comparison with Prior Nuclear Study: No images to compare  Overall Impression:  Normal stress nuclear study.  LV Wall Motion:  NL LV Function, 80%; NL Wall Motion   Jennifer Jacobs A, MD  05/19/2013 4:01 PM

## 2013-05-21 ENCOUNTER — Telehealth: Payer: Self-pay | Admitting: *Deleted

## 2013-05-21 ENCOUNTER — Telehealth: Payer: Self-pay | Admitting: Cardiology

## 2013-05-21 NOTE — Telephone Encounter (Signed)
Returning call to Sharon

## 2013-05-21 NOTE — Telephone Encounter (Signed)
Spoke to patient. Result given . Verbalized understanding  

## 2013-05-21 NOTE — Telephone Encounter (Signed)
Message copied by Tobin Chad on Thu May 21, 2013  3:16 PM ------      Message from: [facility], DAVID      Created: Tue May 19, 2013  9:18 PM       Normal Stress Test!            [employer]       ------

## 2013-05-21 NOTE — Telephone Encounter (Signed)
Left message to callback. Concerning her echo result.

## 2013-06-09 ENCOUNTER — Encounter: Payer: Self-pay | Admitting: Cardiology

## 2013-06-11 ENCOUNTER — Ambulatory Visit: Payer: BC Managed Care – PPO | Admitting: Cardiology

## 2014-01-02 DIAGNOSIS — R404 Transient alteration of awareness: Secondary | ICD-10-CM | POA: Diagnosis not present

## 2014-01-26 ENCOUNTER — Emergency Department (HOSPITAL_COMMUNITY): Payer: Medicare Other

## 2014-01-26 ENCOUNTER — Encounter (HOSPITAL_COMMUNITY): Payer: Self-pay | Admitting: Emergency Medicine

## 2014-01-26 ENCOUNTER — Emergency Department (HOSPITAL_COMMUNITY)
Admission: EM | Admit: 2014-01-26 | Discharge: 2014-01-26 | Disposition: A | Discharge status: Home / Self Care | Payer: Medicare Other | Attending: Emergency Medicine | Admitting: Emergency Medicine

## 2014-01-26 DIAGNOSIS — K7689 Other specified diseases of liver: Secondary | ICD-10-CM | POA: Diagnosis not present

## 2014-01-26 DIAGNOSIS — I10 Essential (primary) hypertension: Secondary | ICD-10-CM | POA: Diagnosis not present

## 2014-01-26 DIAGNOSIS — R1011 Right upper quadrant pain: Secondary | ICD-10-CM | POA: Diagnosis not present

## 2014-01-26 DIAGNOSIS — Z87891 Personal history of nicotine dependence: Secondary | ICD-10-CM | POA: Insufficient documentation

## 2014-01-26 DIAGNOSIS — Z7982 Long term (current) use of aspirin: Secondary | ICD-10-CM | POA: Diagnosis not present

## 2014-01-26 DIAGNOSIS — I252 Old myocardial infarction: Secondary | ICD-10-CM | POA: Diagnosis not present

## 2014-01-26 DIAGNOSIS — Z8639 Personal history of other endocrine, nutritional and metabolic disease: Secondary | ICD-10-CM | POA: Insufficient documentation

## 2014-01-26 DIAGNOSIS — Z862 Personal history of diseases of the blood and blood-forming organs and certain disorders involving the immune mechanism: Secondary | ICD-10-CM | POA: Insufficient documentation

## 2014-01-26 DIAGNOSIS — Z79899 Other long term (current) drug therapy: Secondary | ICD-10-CM | POA: Diagnosis not present

## 2014-01-26 DIAGNOSIS — M129 Arthropathy, unspecified: Secondary | ICD-10-CM | POA: Insufficient documentation

## 2014-01-26 NOTE — ED Provider Notes (Signed)
CSN: [id number]     Arrival date & time 01/26/14  1621 History  This chart was scribed for Nat Christen, MD by Rolanda Lundborg, ED Scribe. This patient was seen in room APA11/APA11 and the patient's care was started at 4:386PM.    No chief complaint on file.  HPI HPI Comments: Jennifer Jacobs is a 68 y.o. female with a h/o HTN, fibromyalgia, and MI who presents to the Emergency Department complaining of sharp upper RUQ abdominal pain that radiates into the back onset today while at work. She had 2 episodes: one at 11am and one at 3:30pm, lasting only a few seconds each.  Pain got better when she leaned forwards. She denies prior h/o similar pain. She denies correlations with eating. She denies SOB, vomiting, diarrhea, fever, chills. She states she gets constipated frequently and takes stool softeners but denies constipation currently. She had a BM today.   PCP - Dr Jimmye Norman in [place]   Past Medical History  Diagnosis Date  . Arthritis   . Hypertension   . Fibromyalgia   . MI (myocardial infarction) 2007    By patient report, not substantiated by recent nuclear stress test.  . Smoker unmotivated to quit     Quit for 3 years and then restarted  . Dyslipidemia    Past Surgical History  Procedure Laterality Date  . Transthoracic echocardiogram  03/25/2013    EF 55-60%, no regional wall motion abnormalities; normal diastolic parameters, normal pulmonary pressures.  No valvular lesions noted.  Essentially normal echo   . Exercise tolerance test  04/27/2013    CPET-MET: Submaximal effort (0.96, with goal of greater than 1.09) -- patient states that her effort was limited due to knee pain.  This makes the rest of the interpretation difficult: Peak VO2 - 10.5 (59% moderate), peak 02-pulse -  7..11 (low); heart rate 114 (73% -chronic incompetence considered);; PFTs normal   . Nm myoview ltd  05/19/2013    LexiScan: EF 80%, no evidence of ischemia or infarction   Family History  Problem Relation  Age of Onset  . Heart attack Father   . Heart attack Maternal Grandmother   . Cancer Maternal Grandfather   . Cancer Paternal Grandmother    History  Substance Use Topics  . Smoking status: Former Smoker -- 0.10 packs/day    Types: Cigarettes    Quit date: 03/06/2013  . Smokeless tobacco: Never Used  . Alcohol Use: Yes     Comment: 1 pint (gin)/week   OB History   Grav Para Term Preterm Abortions TAB SAB Ect Mult Living                 Review of Systems  Constitutional: Negative for fever and chills.  Respiratory: Negative for shortness of breath.   Gastrointestinal: Positive for abdominal pain. Negative for vomiting, diarrhea and constipation.      Allergies  Aspirin  Home Medications   Prior to Admission medications   Medication Sig Start Date End Date Taking? Authorizing Provider  acetaminophen (TYLENOL) 650 MG CR tablet Take 650-1,300 mg by mouth daily as needed for pain.   Yes Historical Provider, MD  aspirin EC 81 MG tablet Take 81 mg by mouth daily.   Yes Historical Provider, MD  docusate sodium (COLACE) 100 MG capsule Take 100 mg by mouth every other day.   Yes Historical Provider, MD  gabapentin (NEURONTIN) 300 MG capsule Take 300 mg by mouth 2 (two) times daily. 12/25/13  Yes Historical Provider,  MD  lisinopril-hydrochlorothiazide (PRINZIDE,ZESTORETIC) 20-12.5 MG per tablet Take 1 tablet by mouth daily.   Yes Historical Provider, MD  metoprolol tartrate (LOPRESSOR) 25 MG tablet Take 25 mg by mouth 2 (two) times daily.   Yes Historical Provider, MD  Pediatric Multivit-Minerals-C (ALIVE MULTI-VITAMIN CHILDRENS) 60 MG CHEW Chew 1 tablet by mouth daily.   Yes Historical Provider, MD  traZODone (DESYREL) 100 MG tablet Take 100 mg by mouth at bedtime.   Yes Historical Provider, MD   BP 129/95  Pulse 82  Temp(Src) 98.9 F (37.2 C) (Oral)  Resp 20  Ht '5\' 8"'  (1.727 m)  Wt 150 lb (68.04 kg)  BMI 22.81 kg/m2  SpO2 98% Physical Exam  Nursing note and vitals  reviewed. Constitutional: She is oriented to person, place, and time. She appears well-developed and well-nourished.  HENT:  Head: Normocephalic and atraumatic.  Eyes: Conjunctivae and EOM are normal. Pupils are equal, round, and reactive to light.  Neck: Normal range of motion. Neck supple.  Cardiovascular: Normal rate, regular rhythm and normal heart sounds.   Pulmonary/Chest: Effort normal and breath sounds normal.  Abdominal: Soft. Bowel sounds are normal.  Musculoskeletal: Normal range of motion.  Neurological: She is alert and oriented to person, place, and time.  Skin: Skin is warm and dry.  Psychiatric: She has a normal mood and affect. Her behavior is normal.    ED Course  Procedures (including critical care time) Medications - No data to display  DIAGNOSTIC STUDIES: Oxygen Saturation is 98% on RA, normal by my interpretation.    COORDINATION OF CARE: 4:42 PM- Discussed treatment plan with pt which includes [place] to rule out gall stones. Pt agrees to plan.    Labs Review Labs Reviewed - No data to display  Imaging Review US Abdomen Limited  01/26/2014   CLINICAL DATA:  Right upper quadrant pain.  Evaluate gallbladder.  EXAM: US ABDOMEN LIMITED - RIGHT UPPER QUADRANT  COMPARISON:  None.  FINDINGS: Gallbladder:  No gallstones or wall thickening visualized. No sonographic Murphy sign noted.  Common bile duct:  Diameter: Normal measuring 3.5 mm.  Liver:  Hypoechoic structure adjacent to gallbladder right lobe liver, measuring 29 x 18 x 17 mm, possibly septated cyst. Overall liver normal in size with no biliary ductal dilatation.  IMPRESSION: No cholelithiasis or signs of acute cholecystitis. No biliary ductal dilatation.  Hypoechoic right lobe liver lesion 29 x 18 x 17 mm, possible septated cyst.   Electronically Signed   By: Rolla Flatten M.D.   On: 01/26/2014 18:15     EKG Interpretation None      MDM   Final diagnoses:  RUQ abdominal pain    No acute abdomen.  Ultrasound shows no cholelithiasis. A small liver cyst was noted.  This was discussed with the patient   I personally performed the services described in this documentation, which was scribed in my presence. The recorded information has been reviewed and is accurate.    Nat Christen, MD 01/26/14 2012

## 2014-01-26 NOTE — Discharge Instructions (Signed)
You have a small cyst in your liver, but no gallstones. Follow up with your primary care Dr.

## 2014-01-26 NOTE — ED Notes (Signed)
Pt had fleeting "sharp " pain under rt breast and around rt ribs, lasting "few seconds"  Episodes  X 2  No nausea,

## 2014-01-28 DIAGNOSIS — Z0000 Encounter for general adult medical examination without abnormal findings: Secondary | ICD-10-CM | POA: Diagnosis not present

## 2014-01-28 DIAGNOSIS — I10 Essential (primary) hypertension: Secondary | ICD-10-CM | POA: Diagnosis not present

## 2014-01-28 DIAGNOSIS — K7689 Other specified diseases of liver: Secondary | ICD-10-CM | POA: Diagnosis not present

## 2014-02-23 ENCOUNTER — Encounter: Payer: Self-pay | Admitting: Internal Medicine

## 2014-03-18 ENCOUNTER — Ambulatory Visit (INDEPENDENT_AMBULATORY_CARE_PROVIDER_SITE_OTHER): Payer: Medicare Other | Admitting: Internal Medicine

## 2014-03-18 ENCOUNTER — Encounter: Payer: Self-pay | Admitting: Internal Medicine

## 2014-03-18 ENCOUNTER — Other Ambulatory Visit (INDEPENDENT_AMBULATORY_CARE_PROVIDER_SITE_OTHER): Payer: Medicare Other

## 2014-03-18 VITALS — BP 136/88 | HR 84 | Ht 66.75 in | Wt 173.4 lb

## 2014-03-18 DIAGNOSIS — K7689 Other specified diseases of liver: Secondary | ICD-10-CM

## 2014-03-18 DIAGNOSIS — Z1211 Encounter for screening for malignant neoplasm of colon: Secondary | ICD-10-CM

## 2014-03-18 HISTORY — DX: Other specified diseases of liver: K76.89

## 2014-03-18 LAB — HEPATIC FUNCTION PANEL
ALBUMIN: 4 g/dL (ref 3.5–5.2)
ALT: 21 U/L (ref 0–35)
AST: 21 U/L (ref 0–37)
Alkaline Phosphatase: 50 U/L (ref 39–117)
BILIRUBIN DIRECT: 0 mg/dL (ref 0.0–0.3)
TOTAL PROTEIN: 7.5 g/dL (ref 6.0–8.3)
Total Bilirubin: 0.2 mg/dL (ref 0.2–1.2)

## 2014-03-18 NOTE — Progress Notes (Signed)
Subjective:    Patient ID: Jennifer Jacobs, female    DOB: [birth year], 68 y.o.   MRN: [id number]  HPI The patient is here because of findings of a liver cyst seen on ultrasound when she went to the emergency department because of right upper quadrant pain. On 01/26/2014 she said she had 2 relatively short-lived sharp right upper quadrant pain that doubled her over. Limited right upper quadrant ultrasound demonstrated a cystic structure, small, with possible septation adjacent to the gallbladder. She has not had any pains prior to this or after. Her GI review of systems otherwise notable for some mild chronic constipation treated with stool softeners. She has some occasional heartburn. GI review of systems is otherwise negative. Allergies  Allergen Reactions  . Aspirin Nausea Only    Cannot take 366m but takes 867mdaily   Outpatient Prescriptions Prior to Visit  Medication Sig Dispense Refill  . acetaminophen (TYLENOL) 650 MG CR tablet Take 650-1,300 mg by mouth daily as needed for pain.      . Marland Kitchenspirin EC 81 MG tablet Take 81 mg by mouth daily.      . Marland Kitchenocusate sodium (COLACE) 100 MG capsule Take 100 mg by mouth every other day.      . gabapentin (NEURONTIN) 300 MG capsule Take 300 mg by mouth 2 (two) times daily.      . Marland Kitchenisinopril-hydrochlorothiazide (PRINZIDE,ZESTORETIC) 20-12.5 MG per tablet Take 1 tablet by mouth daily.      . metoprolol tartrate (LOPRESSOR) 25 MG tablet Take 25 mg by mouth 2 (two) times daily.      . traZODone (DESYREL) 100 MG tablet Take 100 mg by mouth at bedtime as needed.       . Pediatric Multivit-Minerals-C (ALIVE MULTI-VITAMIN CHILDRENS) 60 MG CHEW Chew 1 tablet by mouth daily.       No facility-administered medications prior to visit.   Past Medical History  Diagnosis Date  . Arthritis   . Hypertension   . Fibromyalgia   . MI (myocardial infarction) 2007    By patient report, not substantiated by recent nuclear stress test.  . Smoker unmotivated to  quit     Quit for 3 years and then restarted  . Dyslipidemia   . Anxiety and depression   . Hepatic cyst   . Seizure disorder     none since 2002   Past Surgical History  Procedure Laterality Date  . Transthoracic echocardiogram  03/25/2013    EF 55-60%, no regional wall motion abnormalities; normal diastolic parameters, normal pulmonary pressures.  No valvular lesions noted.  Essentially normal echo   . Exercise tolerance test  04/27/2013    CPET-MET: Submaximal effort (0.96, with goal of greater than 1.09) -- patient states that her effort was limited due to knee pain.  This makes the rest of the interpretation difficult: Peak VO2 - 10.5 (59% moderate), peak 02-pulse -  7..11 (low); heart rate 114 (73% -chronic incompetence considered);; PFTs normal   . Nm myoview ltd  05/19/2013    LexiScan: EF 80%, no evidence of ischemia or infarction  . Elbow arthroplasty Left     rods  . Mouth surgery     History   Social History  . Marital Status: Legally Separated    Spouse Name: N/A    Number of Children: 4  . Years of Education: N/A   Occupational History  . 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 Smoking  status: Former Smoker -- 0.10 packs/day    Types: Cigarettes    Quit date: 03/06/2013  . Smokeless tobacco: Never Used  . Alcohol Use: Yes     Comment: 1 pint (gin)/week  . Drug Use: No  . Sexual Activity: None   Other Topics Concern  . None   Social History Narrative   Married for 8 years.  4 children 6 grandchildren.  Lives with her husband. - Currently separated reported 03/18/2014   Works as a Quarry manager For [employer]     Does drink about 5 alcoholic beverages a week.            Husband: Tessie Eke   Daughters: Gilmore Laroche, [place] Aurea Graff   Family History  Problem Relation Age of Onset  . Heart attack Father   . Heart attack Maternal Grandmother   . Cancer Maternal Grandfather     type unknown  . Cancer Paternal  Grandmother     type unknown  . Hypertension Father   . Ovarian cancer Paternal Aunt   . Prostate cancer Paternal Uncle   . Stomach cancer Paternal Aunt   . Clotting disorder Mother   . Diabetes Daughter         Review of Systems + some anxiety, arthritis and back pain, depressive symptoms, decreased hearing, insomnia and urinary leakage at times. All other review of systems are negative.    Objective:   Physical Exam General:  Well-developed, well-nourished and in no acute distress Eyes:  anicteric. Neck:   supple w/o thyromegaly or mass.  Lungs: Clear to auscultation bilaterally. Heart:  S1S2, no rubs, murmurs, gallops. Abdomen:  soft, non-tender, no hepatosplenomegaly, hernia, or mass and BS+.  Rectal: Deferred until colonoscopy Lymph:  no cervical or supraclavicular adenopathy. Extremities:   no edema Skin   no rash. Neuro:  A&O x 3.  Psych:  appropriate mood and  Affect.   Data Reviewed: I looked at the images of the ultrasound showed it to the patient. I've also reviewed recent primary care notes from Dr. Jimmye Norman --     Assessment & Plan:  Colon cancer screening Will schedule for routine preventative colonoscopy. The risks and benefits as well as alternatives of endoscopic procedure(s) have been discussed and reviewed. All questions answered. The patient agrees to proceed.   Liver cyst I think this is an innocent finding. We'll anticipate repeat ultrasound in 3-6 months unless repeat radiology review suggests the need to do something similar. Does drink regularly, admits to 4 alcoholic beverages daily not discussed with her today but will check LFTs. We'll discuss reducing alcohol consumption in the future.   I appreciate the opportunity to care for this patient. Please send copy to York Ram M.D.

## 2014-03-18 NOTE — Assessment & Plan Note (Addendum)
I think this is an innocent finding. We'll anticipate repeat ultrasound in 3-6 months unless repeat radiology review suggests the need to do something similar. Does drink regularly, admits to 4 alcoholic beverages daily not discussed with her today but will check LFTs. We'll discuss reducing alcohol consumption in the future.

## 2014-03-18 NOTE — Patient Instructions (Addendum)
You have been scheduled for a colonoscopy. Please follow written instructions given to you at your visit today.  Please pick up your prep supplies at the pharmacy. If you use inhalers (even only as needed), please bring them with you on the day of your procedure. Your physician has requested that you go to www.startemmi.com and enter the access code given to you at your visit today. This web site gives a general overview about your procedure. However, you should still follow specific instructions given to you by our office regarding your preparation for the procedure.  You may have a light breakfast the morning of prep day (the day before the procedure). You may choose from: eggs, toast, chicken noodle soup, crackers.  You should have your breakfast completed between 8:00 and 9:00 am.  Clear liquids only for the rest of the day on prep day and up until 3 hours before procedure.    Your physician has requested that you go to the basement for the following lab work before leaving today: LFT's  Today you have been given information to read on Liver cyst.  I appreciate the opportunity to care for you.

## 2014-03-18 NOTE — Assessment & Plan Note (Addendum)
Will schedule for routine preventative colonoscopy. The risks and benefits as well as alternatives of endoscopic procedure(s) have been discussed and reviewed. All questions answered. The patient agrees to proceed.

## 2014-03-22 NOTE — Progress Notes (Signed)
Quick Note:  Liver tests are normal - please let her know and also let her know I looked at the US with radiologist and they agreed we could repeat an US in a few months to see if cyst changing  So needs appointment to do limited RUQ US to follow-up liver cyst - do 4 months after the last US please ______

## 2014-03-23 ENCOUNTER — Other Ambulatory Visit: Payer: Self-pay

## 2014-03-23 DIAGNOSIS — K7689 Other specified diseases of liver: Secondary | ICD-10-CM

## 2014-04-06 ENCOUNTER — Ambulatory Visit (AMBULATORY_SURGERY_CENTER): Payer: Medicare Other | Admitting: Internal Medicine

## 2014-04-06 ENCOUNTER — Encounter: Payer: Self-pay | Admitting: Internal Medicine

## 2014-04-06 VITALS — BP 138/94 | HR 61 | Temp 98.9°F | Resp 25 | Ht 66.75 in | Wt 173.0 lb

## 2014-04-06 DIAGNOSIS — Z1211 Encounter for screening for malignant neoplasm of colon: Secondary | ICD-10-CM

## 2014-04-06 DIAGNOSIS — I10 Essential (primary) hypertension: Secondary | ICD-10-CM | POA: Diagnosis not present

## 2014-04-06 DIAGNOSIS — I252 Old myocardial infarction: Secondary | ICD-10-CM | POA: Diagnosis not present

## 2014-04-06 MED ORDER — METOPROLOL TARTRATE 25 MG PO TABS: 25.0000 mg | ORAL_TABLET | Freq: Two times a day (BID) | ORAL | Status: DC

## 2014-04-06 MED ORDER — SODIUM CHLORIDE 0.9 % IV SOLN: 500.0000 mL | INTRAVENOUS | Status: DC

## 2014-04-06 MED ORDER — LISINOPRIL-HYDROCHLOROTHIAZIDE 20-12.5 MG PO TABS: 1.0000 | ORAL_TABLET | Freq: Every day | ORAL | Status: DC

## 2014-04-06 NOTE — Op Note (Signed)
[facility] [address] [place], [id number]   COLONOSCOPY PROCEDURE REPORT  PATIENT: Jennifer DerrickCobb, Jennifer M.  MR#: [id number] BIRTHDATE: 10-02-1945 , 67  yrs. old GENDER: Female ENDOSCOPIST: Iva Booparl E Winda Summerall, MD, [facility] PROCEDURE DATE:  04/06/2014 PROCEDURE:   Colonoscopy, screening First Screening Colonoscopy - Avg.  risk and is 50 yrs.  old or older Yes.  Prior Negative Screening - Now for repeat screening. N/A  History of Adenoma - Now for follow-up colonoscopy & has been > or = to 3 yrs.  N/A  Polyps Removed Today? No.  Recommend repeat exam, <10 yrs? No. ASA CLASS:   Class II INDICATIONS:average risk screening and first colonoscopy. MEDICATIONS: propofol (Diprivan) 200mg  IV, MAC sedation, administered by CRNA, and These medications were titrated to patient response per physician's verbal order  DESCRIPTION OF PROCEDURE:   After the risks benefits and alternatives of the procedure were thoroughly explained, informed consent was obtained.  A digital rectal exam revealed no abnormalities of the rectum.   The LB [id number] and LB [id number]  endoscope was introduced through the anus and advanced to the cecum, which was identified by both the appendix and ileocecal valve. No adverse events experienced.   The quality of the prep was excellent, using MiraLax  The instrument was then slowly withdrawn as the colon was fully examined.      COLON FINDINGS: A normal appearing cecum, ileocecal valve, and appendiceal orifice were identified.  The ascending, hepatic flexure, transverse, splenic flexure, descending, sigmoid colon and rectum appeared unremarkable.  No polyps or cancers were seen. Retroflexed views revealed no abnormalities. The time to cecum=1 minutes 50 seconds.  Withdrawal time=7 minutes 31 seconds.  The scope was withdrawn and the procedure completed. COMPLICATIONS: There were no complications.  ENDOSCOPIC IMPRESSION: Normal  colonoscopy  RECOMMENDATIONS: Repeat colonoscopy 10 years.   eSigned:  Iva Booparl E Kaitlynne Wenz, MD, [facility] 04/06/2014 3:47 PM   cc: Lerry LinerWilliams, Dwight MD and The Patient

## 2014-04-06 NOTE — Progress Notes (Signed)
B/P 175/107 Rt. ARM AND 168/ 110 LT. ARM . PT HAS NO S/S BUT STATED SHE IS OUT OF MEDICATION FOR B/P AND SHE WAS NERVOUS.

## 2014-04-06 NOTE — Patient Instructions (Signed)
YOU HAD AN ENDOSCOPIC PROCEDURE TODAY AT THE [facility]: Refer to the procedure report that was given to you for any specific questions about what was found during the examination.  If the procedure report does not answer your questions, please call your gastroenterologist to clarify.  If you requested that your care partner not be given the details of your procedure findings, then the procedure report has been included in a sealed envelope for you to review at your convenience later.  YOU SHOULD EXPECT: Some feelings of bloating in the abdomen. Passage of more gas than usual.  Walking can help get rid of the air that was put into your GI tract during the procedure and reduce the bloating. If you had a lower endoscopy (such as a colonoscopy or flexible sigmoidoscopy) you may notice spotting of blood in your stool or on the toilet paper. If you underwent a bowel prep for your procedure, then you may not have a normal bowel movement for a few days.  DIET: Your first meal following the procedure should be a light meal and then it is ok to progress to your normal diet.  A half-sandwich or bowl of soup is an example of a good first meal.  Heavy or fried foods are harder to digest and may make you feel nauseous or bloated.  Likewise meals heavy in dairy and vegetables can cause extra gas to form and this can also increase the bloating.  Drink plenty of fluids but you should avoid alcoholic beverages for 24 hours.  ACTIVITY: Your care partner should take you home directly after the procedure.  You should plan to take it easy, moving slowly for the rest of the day.  You can resume normal activity the day after the procedure however you should NOT DRIVE or use heavy machinery for 24 hours (because of the sedation medicines used during the test).    SYMPTOMS TO REPORT IMMEDIATELY: A gastroenterologist can be reached at any hour.  During normal business hours, 8:30 AM to 5:00 PM Monday through Friday,  call [phone].  After hours and on weekends, please call the GI answering service at [phone] who will take a message and have the physician on call contact you.   Following lower endoscopy (colonoscopy or flexible sigmoidoscopy):  Excessive amounts of blood in the stool  Significant tenderness or worsening of abdominal pains  Swelling of the abdomen that is new, acute  Fever of 100F or higher    FOLLOW UP: If any biopsies were taken you will be contacted by phone or by letter within the next 1-3 weeks.  Call your gastroenterologist if you have not heard about the biopsies in 3 weeks.  Our staff will call the home number listed on your records the next business day following your procedure to check on you and address any questions or concerns that you may have at that time regarding the information given to you following your procedure. This is a courtesy call and so if there is no answer at the home number and we have not heard from you through the emergency physician on call, we will assume that you have returned to your regular daily activities without incident.  SIGNATURES/CONFIDENTIALITY: You and/or your care partner have signed paperwork which will be entered into your electronic medical record.  These signatures attest to the fact that that the information above on your After Visit Summary has been reviewed and is understood.  Full responsibility of the confidentiality   of this discharge information lies with you and/or your care-partner.  Repeat colonoscopy in 10 years - 2025

## 2014-04-06 NOTE — Progress Notes (Signed)
A/ox3, pleased with MAC, report to RN 

## 2014-04-07 ENCOUNTER — Telehealth: Payer: Self-pay | Admitting: *Deleted

## 2014-04-07 NOTE — Telephone Encounter (Signed)
  Follow up Call-  Call back number 04/06/2014  Post procedure Call Back phone  # [phone]  Permission to leave phone message Yes     Patient questions:  Do you have a fever, pain , or abdominal swelling? No. Pain Score  0 *  Have you tolerated food without any problems? Yes.    Have you been able to return to your normal activities? Yes.    Do you have any questions about your discharge instructions: Diet   No. Medications  No. Follow up visit  No.  Do you have questions or concerns about your Care? No.  Actions: * If pain score is 4 or above: No action needed, pain <4.

## 2014-04-29 DIAGNOSIS — M129 Arthropathy, unspecified: Secondary | ICD-10-CM | POA: Diagnosis not present

## 2014-04-29 DIAGNOSIS — I10 Essential (primary) hypertension: Secondary | ICD-10-CM | POA: Diagnosis not present

## 2014-04-29 DIAGNOSIS — M79609 Pain in unspecified limb: Secondary | ICD-10-CM | POA: Diagnosis not present

## 2014-04-29 DIAGNOSIS — IMO0001 Reserved for inherently not codable concepts without codable children: Secondary | ICD-10-CM | POA: Diagnosis not present

## 2014-04-29 DIAGNOSIS — E785 Hyperlipidemia, unspecified: Secondary | ICD-10-CM | POA: Diagnosis not present

## 2014-06-01 ENCOUNTER — Ambulatory Visit (HOSPITAL_COMMUNITY)
Admission: RE | Admit: 2014-06-01 | Discharge: 2014-06-01 | Disposition: A | Discharge status: Home / Self Care | Payer: Medicare Other | Source: Ambulatory Visit | Attending: Internal Medicine | Admitting: Internal Medicine

## 2014-06-01 DIAGNOSIS — K7689 Other specified diseases of liver: Secondary | ICD-10-CM

## 2014-06-01 NOTE — Progress Notes (Signed)
Quick Note:  Let her know cyst size is same No f/u of this needed See me prn Cc:PCP please ______

## 2014-07-01 DIAGNOSIS — I10 Essential (primary) hypertension: Secondary | ICD-10-CM | POA: Diagnosis not present

## 2014-07-01 DIAGNOSIS — Z5181 Encounter for therapeutic drug level monitoring: Secondary | ICD-10-CM | POA: Diagnosis not present

## 2014-07-01 DIAGNOSIS — Z79891 Long term (current) use of opiate analgesic: Secondary | ICD-10-CM | POA: Diagnosis not present

## 2014-07-01 DIAGNOSIS — M797 Fibromyalgia: Secondary | ICD-10-CM | POA: Diagnosis not present

## 2014-07-05 ENCOUNTER — Telehealth (HOSPITAL_COMMUNITY): Payer: Self-pay | Admitting: *Deleted

## 2014-07-05 ENCOUNTER — Ambulatory Visit (INDEPENDENT_AMBULATORY_CARE_PROVIDER_SITE_OTHER): Payer: Self-pay | Admitting: Psychiatry

## 2014-07-05 ENCOUNTER — Encounter (HOSPITAL_COMMUNITY): Payer: Self-pay | Admitting: Psychiatry

## 2014-07-05 VITALS — BP 128/82 | HR 66 | Ht 66.5 in | Wt 174.6 lb

## 2014-07-05 DIAGNOSIS — F329 Major depressive disorder, single episode, unspecified: Secondary | ICD-10-CM | POA: Insufficient documentation

## 2014-07-05 DIAGNOSIS — F3160 Bipolar disorder, current episode mixed, unspecified: Secondary | ICD-10-CM

## 2014-07-05 DIAGNOSIS — F332 Major depressive disorder, recurrent severe without psychotic features: Secondary | ICD-10-CM

## 2014-07-05 DIAGNOSIS — F1010 Alcohol abuse, uncomplicated: Secondary | ICD-10-CM

## 2014-07-05 DIAGNOSIS — R45851 Suicidal ideations: Secondary | ICD-10-CM

## 2014-07-05 HISTORY — DX: Alcohol abuse, uncomplicated: F10.10

## 2014-07-05 MED ORDER — FLUOXETINE HCL 20 MG PO CAPS: 20.0000 mg | ORAL_CAPSULE | Freq: Every day | ORAL | Status: DC

## 2014-07-05 MED ORDER — LAMOTRIGINE 25 MG PO TABS: ORAL_TABLET | ORAL | Status: DC
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
## 2014-07-05 NOTE — Telephone Encounter (Signed)
Pt pharmacy calling due to pt Lamictal. Per pharmacy, pt do not have enough tablets to last her for a month. Per Dr. Tenny Crawoss to have them give her enough tablets to last her for each month. Pharmacy is aware and shows understanding

## 2014-07-12 ENCOUNTER — Telehealth (HOSPITAL_COMMUNITY): Payer: Self-pay | Admitting: *Deleted

## 2014-07-12 NOTE — Telephone Encounter (Signed)
Pt calling stating her Fluoxetine have been giving her a really bad headaches since she started it about a week ago. Per pt, the headaches are causing her to not go to work for a few days. [phone].

## 2014-07-13 NOTE — Telephone Encounter (Signed)
Left message

## 2014-08-03 ENCOUNTER — Ambulatory Visit (HOSPITAL_COMMUNITY): Payer: Medicare Other | Admitting: Psychology

## 2014-08-04 ENCOUNTER — Encounter (HOSPITAL_COMMUNITY): Payer: Self-pay | Admitting: Psychiatry

## 2014-08-04 ENCOUNTER — Ambulatory Visit (INDEPENDENT_AMBULATORY_CARE_PROVIDER_SITE_OTHER): Payer: Medicare Other | Admitting: Psychiatry

## 2014-08-04 VITALS — BP 127/70 | HR 71 | Ht 66.5 in | Wt 170.0 lb

## 2014-08-04 DIAGNOSIS — F1010 Alcohol abuse, uncomplicated: Secondary | ICD-10-CM

## 2014-08-04 DIAGNOSIS — F3160 Bipolar disorder, current episode mixed, unspecified: Secondary | ICD-10-CM

## 2014-08-04 DIAGNOSIS — F332 Major depressive disorder, recurrent severe without psychotic features: Secondary | ICD-10-CM

## 2014-08-04 MED ORDER — LAMOTRIGINE 100 MG PO TABS: 100.0000 mg | ORAL_TABLET | Freq: Every day | ORAL | Status: DC

## 2014-08-04 MED ORDER — ESCITALOPRAM OXALATE 10 MG PO TABS: 10.0000 mg | ORAL_TABLET | Freq: Every day | ORAL | Status: DC
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
## 2014-09-09 ENCOUNTER — Ambulatory Visit (HOSPITAL_COMMUNITY): Payer: Self-pay | Admitting: Psychology

## 2014-09-14 ENCOUNTER — Ambulatory Visit (HOSPITAL_COMMUNITY): Payer: Self-pay | Admitting: Psychiatry

## 2014-09-29 ENCOUNTER — Encounter (HOSPITAL_COMMUNITY): Payer: Self-pay | Admitting: Psychiatry

## 2014-09-29 ENCOUNTER — Ambulatory Visit (INDEPENDENT_AMBULATORY_CARE_PROVIDER_SITE_OTHER): Payer: Medicare Other | Admitting: Psychiatry

## 2014-09-29 VITALS — BP 121/74 | HR 73 | Ht 66.5 in | Wt 172.4 lb

## 2014-09-29 DIAGNOSIS — F332 Major depressive disorder, recurrent severe without psychotic features: Secondary | ICD-10-CM

## 2014-09-29 DIAGNOSIS — F1010 Alcohol abuse, uncomplicated: Secondary | ICD-10-CM

## 2014-09-29 DIAGNOSIS — F3160 Bipolar disorder, current episode mixed, unspecified: Secondary | ICD-10-CM

## 2014-09-29 MED ORDER — LAMOTRIGINE 100 MG PO TABS: 100.0000 mg | ORAL_TABLET | Freq: Every day | ORAL | Status: DC

## 2014-09-29 MED ORDER — TRAZODONE HCL 150 MG PO TABS: 150.0000 mg | ORAL_TABLET | Freq: Every day | ORAL | Status: DC

## 2014-09-29 MED ORDER — ESCITALOPRAM OXALATE 20 MG PO TABS: 20.0000 mg | ORAL_TABLET | Freq: Every day | ORAL | Status: DC
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
## 2014-10-12 ENCOUNTER — Encounter (HOSPITAL_COMMUNITY): Payer: Self-pay | Admitting: Psychology

## 2014-10-12 ENCOUNTER — Ambulatory Visit (INDEPENDENT_AMBULATORY_CARE_PROVIDER_SITE_OTHER): Payer: Medicare Other | Admitting: Psychology

## 2014-10-12 DIAGNOSIS — F332 Major depressive disorder, recurrent severe without psychotic features: Secondary | ICD-10-CM

## 2014-10-12 DIAGNOSIS — F1010 Alcohol abuse, uncomplicated: Secondary | ICD-10-CM
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
## 2014-10-15 ENCOUNTER — Encounter: Payer: Self-pay | Admitting: Psychiatry

## 2014-10-20 DIAGNOSIS — M1990 Unspecified osteoarthritis, unspecified site: Secondary | ICD-10-CM | POA: Diagnosis not present

## 2014-10-20 DIAGNOSIS — M790 Rheumatism, unspecified: Secondary | ICD-10-CM | POA: Diagnosis not present

## 2014-10-20 DIAGNOSIS — R5383 Other fatigue: Secondary | ICD-10-CM | POA: Diagnosis not present

## 2014-10-20 DIAGNOSIS — I10 Essential (primary) hypertension: Secondary | ICD-10-CM | POA: Diagnosis not present

## 2014-10-20 DIAGNOSIS — M797 Fibromyalgia: Secondary | ICD-10-CM | POA: Diagnosis not present

## 2014-11-01 ENCOUNTER — Emergency Department (HOSPITAL_COMMUNITY)
Admission: EM | Admit: 2014-11-01 | Discharge: 2014-11-01 | Disposition: A | Discharge status: Home / Self Care | Payer: Medicare Other | Attending: Emergency Medicine | Admitting: Emergency Medicine

## 2014-11-01 ENCOUNTER — Encounter (HOSPITAL_COMMUNITY): Payer: Self-pay | Admitting: Emergency Medicine

## 2014-11-01 DIAGNOSIS — M797 Fibromyalgia: Secondary | ICD-10-CM | POA: Insufficient documentation

## 2014-11-01 DIAGNOSIS — Y9289 Other specified places as the place of occurrence of the external cause: Secondary | ICD-10-CM | POA: Insufficient documentation

## 2014-11-01 DIAGNOSIS — E785 Hyperlipidemia, unspecified: Secondary | ICD-10-CM | POA: Insufficient documentation

## 2014-11-01 DIAGNOSIS — Z23 Encounter for immunization: Secondary | ICD-10-CM | POA: Diagnosis not present

## 2014-11-01 DIAGNOSIS — Z720 Tobacco use: Secondary | ICD-10-CM | POA: Diagnosis not present

## 2014-11-01 DIAGNOSIS — Z79899 Other long term (current) drug therapy: Secondary | ICD-10-CM | POA: Insufficient documentation

## 2014-11-01 DIAGNOSIS — S61412A Laceration without foreign body of left hand, initial encounter: Secondary | ICD-10-CM | POA: Diagnosis not present

## 2014-11-01 DIAGNOSIS — M1990 Unspecified osteoarthritis, unspecified site: Secondary | ICD-10-CM | POA: Insufficient documentation

## 2014-11-01 DIAGNOSIS — Y9389 Activity, other specified: Secondary | ICD-10-CM | POA: Insufficient documentation

## 2014-11-01 DIAGNOSIS — Z8719 Personal history of other diseases of the digestive system: Secondary | ICD-10-CM | POA: Diagnosis not present

## 2014-11-01 DIAGNOSIS — G40909 Epilepsy, unspecified, not intractable, without status epilepticus: Secondary | ICD-10-CM | POA: Diagnosis not present

## 2014-11-01 DIAGNOSIS — Y998 Other external cause status: Secondary | ICD-10-CM | POA: Diagnosis not present

## 2014-11-01 DIAGNOSIS — I252 Old myocardial infarction: Secondary | ICD-10-CM | POA: Diagnosis not present

## 2014-11-01 DIAGNOSIS — Z7982 Long term (current) use of aspirin: Secondary | ICD-10-CM | POA: Insufficient documentation

## 2014-11-01 DIAGNOSIS — I10 Essential (primary) hypertension: Secondary | ICD-10-CM | POA: Diagnosis not present

## 2014-11-01 MED ORDER — BACITRACIN 500 UNIT/GM EX OINT
1.0000 "application " | TOPICAL_OINTMENT | Freq: Two times a day (BID) | CUTANEOUS | Status: DC
  Administered 2014-11-01: 1 via TOPICAL
  Filled 2014-11-01: qty 0.9

## 2014-11-01 MED ORDER — TETANUS-DIPHTH-ACELL PERTUSSIS 5-2.5-18.5 LF-MCG/0.5 IM SUSP
0.5000 mL | Freq: Once | INTRAMUSCULAR | Status: AC
  Administered 2014-11-01: 0.5 mL via INTRAMUSCULAR
  Filled 2014-11-01: qty 0.5
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
## 2014-11-01 NOTE — Discharge Instructions (Signed)
Wash the affected area with soap and water and apply a thin layer of topical antibiotic ointment. Do this every 12 hours.   Do not use rubbing alcohol or hydrogen peroxide.                        Look for signs of infection: if you see redness, if the area becomes warm, if pain increases sharply, there is discharge (pus), if red streaks appear or you develop fever or vomiting, RETURN immediately to the Emergency Department  for a recheck.    Laceration Care, Adult A laceration is a cut or lesion that goes through all layers of the skin and into the tissue just beneath the skin. TREATMENT  Some lacerations may not require closure. Some lacerations may not be able to be closed due to an increased risk of infection. It is important to see your caregiver as soon as possible after an injury to minimize the risk of infection and maximize the opportunity for successful closure. If closure is appropriate, pain medicines may be given, if needed. The wound will be cleaned to help prevent infection. Your caregiver will use stitches (sutures), staples, wound glue (adhesive), or skin adhesive strips to repair the laceration. These tools bring the skin edges together to allow for faster healing and a better cosmetic outcome. However, all wounds will heal with a scar. Once the wound has healed, scarring can be minimized by covering the wound with sunscreen during the day for 1 full year. HOME CARE INSTRUCTIONS  For sutures or staples:  Keep the wound clean and dry.  If you were given a bandage (dressing), you should change it at least once a day. Also, change the dressing if it becomes wet or dirty, or as directed by your caregiver.  Wash the wound with soap and water 2 times a day. Rinse the wound off with water to remove all soap. Pat the wound dry with a clean towel.  After cleaning, apply a thin layer of the antibiotic ointment as recommended by your caregiver. This will help prevent infection and keep the  dressing from sticking.  You may shower as usual after the first 24 hours. Do not soak the wound in water until the sutures are removed.  Only take over-the-counter or prescription medicines for pain, discomfort, or fever as directed by your caregiver.  Get your sutures or staples removed as directed by your caregiver. For skin adhesive strips:  Keep the wound clean and dry.  Do not get the skin adhesive strips wet. You may bathe carefully, using caution to keep the wound dry.  If the wound gets wet, pat it dry with a clean towel.  Skin adhesive strips will fall off on their own. You may trim the strips as the wound heals. Do not remove skin adhesive strips that are still stuck to the wound. They will fall off in time. For wound adhesive:  You may briefly wet your wound in the shower or bath. Do not soak or scrub the wound. Do not swim. Avoid periods of heavy perspiration until the skin adhesive has fallen off on its own. After showering or bathing, gently pat the wound dry with a clean towel.  Do not apply liquid medicine, cream medicine, or ointment medicine to your wound while the skin adhesive is in place. This may loosen the film before your wound is healed.  If a dressing is placed over the wound, be careful not to apply  tape directly over the skin adhesive. This may cause the adhesive to be pulled off before the wound is healed.  Avoid prolonged exposure to sunlight or tanning lamps while the skin adhesive is in place. Exposure to ultraviolet light in the first year will darken the scar.  The skin adhesive will usually remain in place for 5 to 10 days, then naturally fall off the skin. Do not pick at the adhesive film. You may need a tetanus shot if:  You cannot remember when you had your last tetanus shot.  You have never had a tetanus shot. If you get a tetanus shot, your arm may swell, get red, and feel warm to the touch. This is common and not a problem. If you need a  tetanus shot and you choose not to have one, there is a rare chance of getting tetanus. Sickness from tetanus can be serious. SEEK MEDICAL CARE IF:   You have redness, swelling, or increasing pain in the wound.  You see a red line that goes away from the wound.  You have yellowish-white fluid (pus) coming from the wound.  You have a fever.  You notice a bad smell coming from the wound or dressing.  Your wound breaks open before or after sutures have been removed.  You notice something coming out of the wound such as wood or glass.  Your wound is on your hand or foot and you cannot move a finger or toe. SEEK IMMEDIATE MEDICAL CARE IF:   Your pain is not controlled with prescribed medicine.  You have severe swelling around the wound causing pain and numbness or a change in color in your arm, hand, leg, or foot.  Your wound splits open and starts bleeding.  You have worsening numbness, weakness, or loss of function of any joint around or beyond the wound.  You develop painful lumps near the wound or on the skin anywhere on your body. MAKE SURE YOU:   Understand these instructions.  Will watch your condition.  Will get help right away if you are not doing well or get worse. Document Released: 08/06/2005 Document Revised: 10/29/2011 Document Reviewed: 01/30/2011 [facility] Patient Information 2015 [employer], [place]. This information is not intended to replace advice given to you by your health care provider. Make sure you discuss any questions you have with your health care provider.

## 2014-11-26 ENCOUNTER — Encounter (HOSPITAL_COMMUNITY): Payer: Self-pay | Admitting: Psychiatry

## 2014-11-26 ENCOUNTER — Ambulatory Visit (INDEPENDENT_AMBULATORY_CARE_PROVIDER_SITE_OTHER): Payer: Medicare Other | Admitting: Psychiatry

## 2014-11-26 VITALS — BP 150/98 | Ht 66.0 in | Wt 168.0 lb

## 2014-11-26 DIAGNOSIS — F1010 Alcohol abuse, uncomplicated: Secondary | ICD-10-CM

## 2014-11-26 DIAGNOSIS — F3160 Bipolar disorder, current episode mixed, unspecified: Secondary | ICD-10-CM

## 2014-11-26 DIAGNOSIS — F332 Major depressive disorder, recurrent severe without psychotic features: Secondary | ICD-10-CM

## 2014-11-26 MED ORDER — ESCITALOPRAM OXALATE 20 MG PO TABS: 20.0000 mg | ORAL_TABLET | Freq: Every day | ORAL | Status: DC

## 2014-11-26 MED ORDER — TRAZODONE HCL 150 MG PO TABS: 150.0000 mg | ORAL_TABLET | Freq: Every day | ORAL | Status: DC

## 2014-11-26 MED ORDER — LAMOTRIGINE 100 MG PO TABS: 100.0000 mg | ORAL_TABLET | Freq: Every day | ORAL | Status: DC
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
## 2015-01-26 ENCOUNTER — Encounter (HOSPITAL_COMMUNITY): Payer: Self-pay | Admitting: Psychiatry

## 2015-01-26 ENCOUNTER — Ambulatory Visit (INDEPENDENT_AMBULATORY_CARE_PROVIDER_SITE_OTHER): Payer: Medicare Other | Admitting: Psychiatry

## 2015-01-26 VITALS — BP 130/86 | HR 70 | Ht 67.0 in | Wt 170.0 lb

## 2015-01-26 DIAGNOSIS — F1010 Alcohol abuse, uncomplicated: Secondary | ICD-10-CM | POA: Diagnosis not present

## 2015-01-26 DIAGNOSIS — F3160 Bipolar disorder, current episode mixed, unspecified: Secondary | ICD-10-CM | POA: Diagnosis not present

## 2015-01-26 DIAGNOSIS — F332 Major depressive disorder, recurrent severe without psychotic features: Secondary | ICD-10-CM

## 2015-01-26 MED ORDER — TRAZODONE HCL 150 MG PO TABS: 150.0000 mg | ORAL_TABLET | Freq: Every day | ORAL | Status: DC

## 2015-01-26 MED ORDER — LAMOTRIGINE 100 MG PO TABS: 100.0000 mg | ORAL_TABLET | Freq: Two times a day (BID) | ORAL | Status: DC

## 2015-01-26 MED ORDER — ESCITALOPRAM OXALATE 20 MG PO TABS: 20.0000 mg | ORAL_TABLET | Freq: Every day | ORAL | Status: DC
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
## 2015-02-11 DIAGNOSIS — I10 Essential (primary) hypertension: Secondary | ICD-10-CM | POA: Diagnosis not present

## 2015-02-11 DIAGNOSIS — G894 Chronic pain syndrome: Secondary | ICD-10-CM | POA: Diagnosis not present

## 2015-02-11 DIAGNOSIS — E785 Hyperlipidemia, unspecified: Secondary | ICD-10-CM | POA: Diagnosis not present

## 2015-02-16 ENCOUNTER — Ambulatory Visit (HOSPITAL_COMMUNITY): Payer: Medicare Other | Admitting: Psychology

## 2015-03-04 ENCOUNTER — Encounter: Payer: Self-pay | Admitting: Neurology

## 2015-03-04 ENCOUNTER — Ambulatory Visit (INDEPENDENT_AMBULATORY_CARE_PROVIDER_SITE_OTHER): Payer: Medicare Other | Admitting: Neurology

## 2015-03-04 VITALS — BP 176/102 | HR 64 | Resp 14 | Ht 67.0 in | Wt 172.8 lb

## 2015-03-04 DIAGNOSIS — M5440 Lumbago with sciatica, unspecified side: Secondary | ICD-10-CM | POA: Diagnosis not present

## 2015-03-04 DIAGNOSIS — M797 Fibromyalgia: Secondary | ICD-10-CM | POA: Insufficient documentation

## 2015-03-04 DIAGNOSIS — M5412 Radiculopathy, cervical region: Secondary | ICD-10-CM | POA: Diagnosis not present

## 2015-03-04 DIAGNOSIS — F331 Major depressive disorder, recurrent, moderate: Secondary | ICD-10-CM

## 2015-03-04 DIAGNOSIS — M5416 Radiculopathy, lumbar region: Secondary | ICD-10-CM | POA: Insufficient documentation

## 2015-03-04 DIAGNOSIS — G894 Chronic pain syndrome: Secondary | ICD-10-CM | POA: Insufficient documentation

## 2015-03-04 DIAGNOSIS — M542 Cervicalgia: Secondary | ICD-10-CM

## 2015-03-04 MED ORDER — NORTRIPTYLINE HCL 25 MG PO CAPS: 25.0000 mg | ORAL_CAPSULE | Freq: Every day | ORAL | Status: DC

## 2015-03-04 MED ORDER — NORTRIPTYLINE HCL 25 MG PO CAPS: ORAL_CAPSULE | ORAL | Status: DC
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
## 2015-03-22 ENCOUNTER — Emergency Department (HOSPITAL_COMMUNITY): Payer: Medicare Other

## 2015-03-22 ENCOUNTER — Encounter (HOSPITAL_COMMUNITY): Payer: Self-pay | Admitting: Cardiology

## 2015-03-22 ENCOUNTER — Emergency Department (HOSPITAL_COMMUNITY)
Admission: EM | Admit: 2015-03-22 | Discharge: 2015-03-22 | Disposition: A | Discharge status: Home / Self Care | Payer: Medicare Other | Attending: Emergency Medicine | Admitting: Emergency Medicine

## 2015-03-22 DIAGNOSIS — I10 Essential (primary) hypertension: Secondary | ICD-10-CM | POA: Insufficient documentation

## 2015-03-22 DIAGNOSIS — M549 Dorsalgia, unspecified: Secondary | ICD-10-CM

## 2015-03-22 DIAGNOSIS — G40909 Epilepsy, unspecified, not intractable, without status epilepticus: Secondary | ICD-10-CM | POA: Diagnosis not present

## 2015-03-22 DIAGNOSIS — M25561 Pain in right knee: Secondary | ICD-10-CM | POA: Insufficient documentation

## 2015-03-22 DIAGNOSIS — I771 Stricture of artery: Secondary | ICD-10-CM | POA: Diagnosis not present

## 2015-03-22 DIAGNOSIS — Z8719 Personal history of other diseases of the digestive system: Secondary | ICD-10-CM | POA: Insufficient documentation

## 2015-03-22 DIAGNOSIS — R0602 Shortness of breath: Secondary | ICD-10-CM | POA: Diagnosis not present

## 2015-03-22 DIAGNOSIS — E279 Disorder of adrenal gland, unspecified: Secondary | ICD-10-CM | POA: Diagnosis not present

## 2015-03-22 DIAGNOSIS — R109 Unspecified abdominal pain: Secondary | ICD-10-CM | POA: Diagnosis not present

## 2015-03-22 DIAGNOSIS — Z7982 Long term (current) use of aspirin: Secondary | ICD-10-CM | POA: Diagnosis not present

## 2015-03-22 DIAGNOSIS — M545 Low back pain: Secondary | ICD-10-CM | POA: Insufficient documentation

## 2015-03-22 DIAGNOSIS — M5136 Other intervertebral disc degeneration, lumbar region: Secondary | ICD-10-CM | POA: Diagnosis not present

## 2015-03-22 DIAGNOSIS — F419 Anxiety disorder, unspecified: Secondary | ICD-10-CM | POA: Insufficient documentation

## 2015-03-22 DIAGNOSIS — N281 Cyst of kidney, acquired: Secondary | ICD-10-CM | POA: Diagnosis not present

## 2015-03-22 DIAGNOSIS — F329 Major depressive disorder, single episode, unspecified: Secondary | ICD-10-CM | POA: Diagnosis not present

## 2015-03-22 DIAGNOSIS — M5126 Other intervertebral disc displacement, lumbar region: Secondary | ICD-10-CM | POA: Diagnosis not present

## 2015-03-22 DIAGNOSIS — M1990 Unspecified osteoarthritis, unspecified site: Secondary | ICD-10-CM | POA: Insufficient documentation

## 2015-03-22 DIAGNOSIS — Z79899 Other long term (current) drug therapy: Secondary | ICD-10-CM | POA: Insufficient documentation

## 2015-03-22 DIAGNOSIS — E278 Other specified disorders of adrenal gland: Secondary | ICD-10-CM

## 2015-03-22 DIAGNOSIS — E785 Hyperlipidemia, unspecified: Secondary | ICD-10-CM | POA: Insufficient documentation

## 2015-03-22 DIAGNOSIS — I252 Old myocardial infarction: Secondary | ICD-10-CM | POA: Insufficient documentation

## 2015-03-22 DIAGNOSIS — Z720 Tobacco use: Secondary | ICD-10-CM | POA: Diagnosis not present

## 2015-03-22 DIAGNOSIS — K7689 Other specified diseases of liver: Secondary | ICD-10-CM | POA: Diagnosis not present

## 2015-03-22 DIAGNOSIS — G8929 Other chronic pain: Secondary | ICD-10-CM | POA: Insufficient documentation

## 2015-03-22 LAB — BASIC METABOLIC PANEL
ANION GAP: 6 (ref 5–15)
BUN: 11 mg/dL (ref 6–20)
CO2: 28 mmol/L (ref 22–32)
Calcium: 9.6 mg/dL (ref 8.9–10.3)
Chloride: 104 mmol/L (ref 101–111)
Creatinine, Ser: 0.95 mg/dL (ref 0.44–1.00)
GFR calc Af Amer: >60 mL/min (ref >60)
GLUCOSE: 83 mg/dL (ref 65–99)
Potassium: 3.6 mmol/L (ref 3.5–5.1)
Sodium: 138 mmol/L (ref 135–145)

## 2015-03-22 LAB — CBC WITH DIFFERENTIAL/PLATELET
BASOS ABS: 0 10*3/uL (ref 0.0–0.1)
Basophils Relative: 0 % (ref 0–1)
EOS PCT: 3 % (ref 0–5)
Eosinophils Absolute: 0.2 10*3/uL (ref 0.0–0.7)
HCT: 39.1 % (ref 36.0–46.0)
Hemoglobin: 13.1 g/dL (ref 12.0–15.0)
LYMPHS ABS: 2.7 10*3/uL (ref 0.7–4.0)
Lymphocytes Relative: 34 % (ref 12–46)
MCH: 31.3 pg (ref 26.0–34.0)
MCHC: 33.5 g/dL (ref 30.0–36.0)
MCV: 93.5 fL (ref 78.0–100.0)
Monocytes Absolute: 0.7 10*3/uL (ref 0.1–1.0)
Monocytes Relative: 8 % (ref 3–12)
Neutro Abs: 4.5 10*3/uL (ref 1.7–7.7)
Neutrophils Relative %: 55 % (ref 43–77)
PLATELETS: 250 10*3/uL (ref 150–400)
RBC: 4.18 MIL/uL (ref 3.87–5.11)
RDW: 13.5 % (ref 11.5–15.5)
WBC: 8 10*3/uL (ref 4.0–10.5)

## 2015-03-22 MED ORDER — MORPHINE SULFATE 4 MG/ML IJ SOLN
6.0000 mg | Freq: Once | INTRAMUSCULAR | Status: AC
Start: 2015-03-22 — End: 2015-03-22
  Administered 2015-03-22: 6 mg via INTRAVENOUS

## 2015-03-22 MED ORDER — CYCLOBENZAPRINE HCL 10 MG PO TABS
10.0000 mg | ORAL_TABLET | Freq: Once | ORAL | Status: AC
  Administered 2015-03-22: 10 mg via ORAL
  Filled 2015-03-22: qty 1

## 2015-03-22 MED ORDER — ORPHENADRINE CITRATE ER 100 MG PO TB12: 100.0000 mg | ORAL_TABLET | Freq: Two times a day (BID) | ORAL | Status: DC | PRN

## 2015-03-22 MED ORDER — MORPHINE SULFATE 4 MG/ML IJ SOLN
6.0000 mg | Freq: Once | INTRAMUSCULAR | Status: DC
  Filled 2015-03-22: qty 2

## 2015-03-22 MED ORDER — SODIUM CHLORIDE 0.9 % IV BOLUS (SEPSIS)
500.0000 mL | Freq: Once | INTRAVENOUS | Status: DC
Start: 2015-03-22 — End: 2015-03-22

## 2015-03-22 MED ORDER — IOHEXOL 350 MG/ML SOLN
100.0000 mL | Freq: Once | INTRAVENOUS | Status: AC | PRN
  Administered 2015-03-22: 100 mL via INTRAVENOUS

## 2015-03-22 NOTE — ED Provider Notes (Signed)
CSN: [id number]     Arrival date & time 03/22/15  0626 History  This chart was scribed for Jennifer Morrison, MD by Zola Button, ED Scribe. This patient was seen in room [facility]14/[facility]14 and the patient's care was started at 9:14 AM.       Chief Complaint  Patient presents with  . Flank Pain   Patient is a 69 y.o. female presenting with hip pain. The history is provided by the patient. No language interpreter was used.  Hip Pain This is a chronic problem. The current episode started more than 1 week ago. The problem occurs constantly. The problem has been gradually worsening. Associated symptoms include shortness of breath. Pertinent negatives include no chest pain.   HPI Comments: Jennifer Jacobs is a 69 y.o. female who presents to the Emergency Department complaining of gradual onset pain to her bilateral hips, legs and back that started about a year ago, but has been getting worse. The pain is worse on the right side. She has had difficulty ambulating due to the pain. She has also quit her job due to the pain. She has been taking hydrocodone and ibuprofen for the pain. Patient has had SOB for a long period of time. She denies chest pain, syncope, unexpected weight loss, urinary symptoms and other symptoms. She also denies new injuries, recent surgery within the past 2 months, and history of cancer. She does admit to smoking sometimes. Patient had an MRI scheduled last month, but it was not done. She has been trying to apply for disabilities.   Past Medical History  Diagnosis Date  . Arthritis   . Hypertension   . Fibromyalgia   . MI (myocardial infarction) 2007    By patient report, not substantiated by recent nuclear stress test.  . Smoker unmotivated to quit     Quit for 3 years and then restarted  . Dyslipidemia   . Anxiety and depression   . Hepatic cyst   . Seizure disorder     none since 2002  . Substance abuse     ETOH  . Seizures   . Vision abnormalities    Past Surgical History   Procedure Laterality Date  . Transthoracic echocardiogram  03/25/2013    EF 55-60%, no regional wall motion abnormalities; normal diastolic parameters, normal pulmonary pressures.  No valvular lesions noted.  Essentially normal echo   . Exercise tolerance test  04/27/2013    CPET-MET: Submaximal effort (0.96, with goal of greater than 1.09) -- patient states that her effort was limited due to knee pain.  This makes the rest of the interpretation difficult: Peak VO2 - 10.5 (59% moderate), peak 02-pulse -  7..11 (low); heart rate 114 (73% -chronic incompetence considered);; PFTs normal   . Nm myoview ltd  05/19/2013    LexiScan: EF 80%, no evidence of ischemia or infarction  . Elbow arthroplasty Left     rods  . Mouth surgery     Family History  Problem Relation Age of Onset  . Heart attack Father   . Hypertension Father   . Alcohol abuse Father   . Heart attack Maternal Grandmother   . Cancer Maternal Grandfather     type unknown  . Cancer Paternal Grandmother     type unknown  . Ovarian cancer Paternal Aunt   . Prostate cancer Paternal Uncle   . Stomach cancer Paternal Aunt   . Clotting disorder Mother   . Depression Mother   . Alcohol abuse Mother   .  Diabetes Daughter   . Depression Sister   . Alcohol abuse Sister   . Alcohol abuse Brother   . Depression Sister   . Alcohol abuse Sister   . Depression Sister   . Alcohol abuse Sister   . Alcohol abuse Brother    History  Substance Use Topics  . Smoking status: Current Some Day Smoker -- 0.10 packs/day    Types: Cigarettes    Last Attempt to Quit: 03/06/2013  . Smokeless tobacco: Never Used  . Alcohol Use: Yes     Comment: quit one month ago.     OB History    No data available     Review of Systems  Constitutional: Negative for unexpected weight change.  Respiratory: Positive for shortness of breath.   Cardiovascular: Negative for chest pain.  Musculoskeletal: Positive for myalgias, back pain and arthralgias.   Neurological: Negative for syncope.      Allergies  Aspirin and Prozac  Home Medications   Prior to Admission medications   Medication Sig Start Date End Date Taking? Authorizing Provider  aspirin EC 81 MG tablet Take 81 mg by mouth daily.   Yes Historical Provider, MD  diphenhydrAMINE (BENADRYL) 25 MG tablet Take 50 mg by mouth every 6 (six) hours as needed for allergies (allergies).    Yes Historical Provider, MD  docusate sodium (COLACE) 100 MG capsule Take 100 mg by mouth daily as needed for mild constipation (constipation).    Yes Historical Provider, MD  escitalopram (LEXAPRO) 20 MG tablet Take 1 tablet (20 mg total) by mouth daily. 01/26/15 01/26/16 Yes Cloria Spring, MD  gabapentin (NEURONTIN) 300 MG capsule Take 300 mg by mouth 2 (two) times daily. 12/25/13  Yes Historical Provider, MD  HYDROcodone-ibuprofen (VICOPROFEN) 7.5-200 MG per tablet Take 1 tablet by mouth every 6 (six) hours as needed for moderate pain or severe pain (pain).  07/02/14  Yes Historical Provider, MD  lamoTRIgine (LAMICTAL) 100 MG tablet Take 1 tablet (100 mg total) by mouth 2 (two) times daily. 01/26/15 01/26/16 Yes Cloria Spring, MD  lisinopril-hydrochlorothiazide (PRINZIDE,ZESTORETIC) 20-12.5 MG per tablet Take 1 tablet by mouth daily. 04/06/14  Yes Gatha Mayer, MD  metoprolol tartrate (LOPRESSOR) 25 MG tablet Take 1 tablet (25 mg total) by mouth 2 (two) times daily. 04/06/14  Yes Gatha Mayer, MD  Multiple Vitamins-Minerals (CENTRUM SILVER ADULT 50+ PO) Take 1 tablet by mouth daily.   Yes Historical Provider, MD  Omega-3 Fatty Acids (FISH OIL) 1000 MG CAPS Take 1 capsule by mouth daily.   Yes Historical Provider, MD  pravastatin (PRAVACHOL) 40 MG tablet  02/01/15  Yes Historical Provider, MD  traZODone (DESYREL) 150 MG tablet Take 1 tablet (150 mg total) by mouth at bedtime. 01/26/15  Yes Cloria Spring, MD  nortriptyline (PAMELOR) 25 MG capsule One pill nightly x 3 days, then 2 pills po nightly Patient not  taking: Reported on 03/22/2015 03/04/15   Britt Bottom, MD  orphenadrine (NORFLEX) 100 MG tablet Take 1 tablet (100 mg total) by mouth 2 (two) times daily as needed for muscle spasms. 03/22/15   Jennifer Morrison, MD   BP 142/80 mmHg  Pulse 59  Temp(Src) 98.8 F (37.1 C) (Oral)  Resp 16  SpO2 100% Physical Exam  Constitutional: She is oriented to person, place, and time. She appears well-developed and well-nourished. No distress.  HENT:  Head: Normocephalic and atraumatic.  Mouth/Throat: Oropharynx is clear and moist. No oropharyngeal exudate.  Eyes: Pupils are equal,  round, and reactive to light.  Neck: Neck supple.  Cardiovascular: Normal rate, regular rhythm and normal heart sounds.   No murmur heard. Pulses:      Dorsalis pedis pulses are 2+ on the right side.       Posterior tibial pulses are 2+ on the right side.  Pulmonary/Chest: Effort normal.  Anterior lung fields clear. Few sparse crackles at the base.  Abdominal: There is no tenderness.  No focal tenderness to abdomen.  Musculoskeletal: She exhibits tenderness. She exhibits no edema.  Pain to palpation with flexion of right knee, external rotation of the hip. No warmth of joint, no significant effusion in right knee. Normal flexion and extension of hips, knees and ankles.  Tenderness to paraspinal and midline in back.  No lower extremity swelling.  Neurological: She is alert and oriented to person, place, and time. No cranial nerve deficit.  Patient has 5+ strength and can lift right hip up. Mild decreased strength likely secondary to pain on right vs. Left. 1-2+ reflexes in knees and ankles.  Skin: Skin is warm and dry. No rash noted.  Psychiatric: She has a normal mood and affect. Her behavior is normal.  Nursing note and vitals reviewed.   ED Course  Procedures  EMERGENCY DEPARTMENT ULTRASOUND  Study: Limited Retroperitoneal Ultrasound of the Abdominal Aorta.  INDICATIONS:Back pain and Age>55 Multiple views of the  abdominal aorta were obtained in real-time from the diaphragmatic hiatus to the aortic bifurcation in transverse planes with a multi-frequency probe. PERFORMED BY: Myself IMAGES ARCHIVED?: Yes FINDINGS: Maximum aortic dimensions are 2.8 LIMITATIONS:  Bowel gas INTERPRETATION: borderline aneurysm, bowel gas limited views   CPT Code: 7342646233 (limited retroperitoneal)   DIAGNOSTIC STUDIES: Oxygen Saturation is 99% on room air, normal by my interpretation.    COORDINATION OF CARE: 9:25 AM-Discussed treatment plan which includes XR imaging and morphine injection with patient/guardian at bedside and patient/guardian agreed to plan.     Labs Review Labs Reviewed  BASIC METABOLIC PANEL  CBC WITH DIFFERENTIAL/PLATELET    Imaging Review Dg Lumbar Spine Complete  03/22/2015   CLINICAL DATA:  69 year old female chronic lower back pain for years worsening. No recent injury. Initial encounter.  EXAM: LUMBAR SPINE - COMPLETE 4+ VIEW  COMPARISON:  01/21/2012.  FINDINGS: L4-5 facet joint degenerative changes with 3 mm anterior slip of L4 and very mild L4-5 disc space narrowing. Findings new since prior exam  Schmorl's node deformity inferior endplate L5. Superimposed degenerative changes have progressed slightly since prior exam.  Vascular calcifications. Abdominal aortic aneurysm L4 level with AP dimension of 3.1 cm versus prior 2.4 cm. Plain film magnification may accentuate this finding.  Mild sacroiliac joint degenerative changes greater on the left.  Mild hip joint degenerative changes greater on the right.  IMPRESSION: L4-5 facet joint degenerative changes with 3 mm anterior slip of L4 and very mild L4-5 disc space narrowing new since prior exam.  Schmorl's node deformity inferior endplate L5. Superimposed degenerative changes have progressed slightly since prior exam.  Abdominal aortic aneurysm L4 level with AP dimension of 3.1 cm versus prior 2.4 cm. Plain film magnification may accentuate this  finding. Ultrasound could be obtained further delineation.  Mild sacroiliac joint degenerative changes greater on the left.  Mild hip joint degenerative changes greater on the right.   Electronically Signed   By: Genia Del M.D.   On: 03/22/2015 10:21   Ct Cta Abd/pel W/cm &/or W/o Cm  03/22/2015   CLINICAL DATA:  Bilateral hip  leg and back pain.  EXAM: CTA ABDOMEN AND PELVIS wITHOUT AND WITH CONTRAST  TECHNIQUE: Multidetector CT imaging of the abdomen and pelvis was performed using the standard protocol during bolus administration of intravenous contrast. Multiplanar reconstructed images and MIPs were obtained and reviewed to evaluate the vascular anatomy.  CONTRAST:  159m OMNIPAQUE IOHEXOL 350 MG/ML SOLN  COMPARISON:  None.  FINDINGS: The aorta is non aneurysmal and patent with scattered atherosclerotic calcifications.  Celiac axis is patent.  Branch vessels are patent.  SMA is patent. Branch vessels are patent. Replaced right hepatic artery anatomy.  Atherosclerotic changes at the origin of the left renal artery without significant narrowing. Right renal artery is patent  IMA is patent.  Branch vessels are patent.  It is diminutive.  Mild atherosclerotic changes of the right common and internal iliac arteries without significant focal narrowing. Right external iliac artery is patent. Maximal right common iliac artery diameter is 14 mm  Maximal left common iliac artery diameter is 15 mm. It is patent. Left external iliac artery is patent. Atherosclerotic changes at the origin of the left internal iliac artery result in mild narrowing.  There is a lobulated benign appearing cysts in the liver adjacent of the gallbladder.  Spleen, pancreas, left adrenal gland are within normal limits  8 mm nodule in the right adrenal gland  Several simple cysts in the kidneys. No hydronephrosis or solid mass.  Normal appendix. Uterus and adnexa are unremarkable. Bladder is within normal limits.  No vertebral compression  deformity. Degenerative disc disease at L5-S1 is present with vacuum. There is also L3-4 and L4-5 facet arthropathy.  Review of the MIP images confirms the above findings.  IMPRESSION: No significant arterial occlusive disease to result in back pain or thigh pain. There is mild narrowing of the origin of the left internal iliac artery. Mild ectasia of the bilateral common iliac arteries.  Benign appearing cysts in the liver and kidneys.  Nonspecific 8 mm right adrenal nodule. If there is a high risk of malignancy or a history of malignancy, MRI can be performed.   Electronically Signed   By: AMarybelle KillingsM.D.   On: 03/22/2015 12:48     EKG Interpretation None      MDM   Final diagnoses:  Right flank pain, chronic  Chronic knee pain, right  Back pain, chronic  Degenerative disc disease, lumbar  Adrenal nodule   Patient presented with worsening generalized pain worse in the right lower flank hip and right knee. Being mostly chronic in nature and no focal weakness or concern for cauda equina this time no emergent MRI indicated. Bedside ultrasound showed borderline abdominal aortic diameter, x-ray reviewed by myself and radiology concern for mild AAA. Plan for CT angiogram of abdomen pelvis for further details. Patient likely will require close outpatient follow-up including MRI of her lower back and further evaluation by primary doctor. CT scan results reviewed no acute process, patient to follow-up outpatient for adrenal nodule. Results and differential diagnosis were discussed with the patient/parent/guardian. Xrays were independently reviewed by myself.  Close follow up outpatient was discussed, comfortable with the plan.   Medications  sodium chloride 0.9 % bolus 500 mL (not administered)  morphine 4 MG/ML injection 6 mg (6 mg Intravenous Given 03/22/15 0936)  iohexol (OMNIPAQUE) 350 MG/ML injection 100 mL (100 mLs Intravenous Contrast Given 03/22/15 1217)  cyclobenzaprine (FLEXERIL) tablet  10 mg (10 mg Oral Given 03/22/15 1253)    Filed Vitals:   03/22/15 0858 03/22/15  6122 03/22/15 0946  BP: 148/93 140/79 142/80  Pulse: 70 59 59  Temp: 98.8 F (37.1 C)    TempSrc: Oral    Resp: _0 SpO2: 99% 95% 100%    Final diagnoses:  Right flank pain, chronic  Chronic knee pain, right  Back pain, chronic  Degenerative disc disease, lumbar  Adrenal nodule      Jennifer Morrison, MD 03/22/15 1324

## 2015-03-22 NOTE — Discharge Instructions (Signed)
Please follow closely with your primary doctor to have your MRI that was previously arranged. Return for leg weakness, urinary changes or new concerns. Discuss further imaging for adrenal nodule with primary Dr. in the next few months. If you were given medicines take as directed.  If you are on coumadin or contraceptives realize their levels and effectiveness is altered by many different medicines.  If you have any reaction (rash, tongues swelling, other) to the medicines stop taking and see a physician.    If your blood pressure was elevated in the ER make sure you follow up for management with a primary doctor or return for chest pain, shortness of breath or stroke symptoms.  Please follow up as directed and return to the ER or see a physician for new or worsening symptoms.  Thank you. Filed Vitals:   03/22/15 0858 03/22/15 0904  BP: 148/93 140/79  Pulse: 70 59  Temp: 98.8 F (37.1 C)   TempSrc: Oral   Resp: 14 18  SpO2: 99% 95%

## 2015-03-22 NOTE — ED Notes (Signed)
Here for generalized pain. .  Pain worse on right side of abdomen.

## 2015-03-22 NOTE — ED Notes (Signed)
MD at bedside. 

## 2015-03-28 ENCOUNTER — Ambulatory Visit (INDEPENDENT_AMBULATORY_CARE_PROVIDER_SITE_OTHER): Payer: Medicare Other | Admitting: Psychiatry

## 2015-03-28 ENCOUNTER — Encounter (HOSPITAL_COMMUNITY): Payer: Self-pay | Admitting: Psychiatry

## 2015-03-28 DIAGNOSIS — F332 Major depressive disorder, recurrent severe without psychotic features: Secondary | ICD-10-CM

## 2015-03-28 DIAGNOSIS — F1010 Alcohol abuse, uncomplicated: Secondary | ICD-10-CM

## 2015-03-28 DIAGNOSIS — F3160 Bipolar disorder, current episode mixed, unspecified: Secondary | ICD-10-CM

## 2015-03-28 MED ORDER — ESCITALOPRAM OXALATE 20 MG PO TABS: 20.0000 mg | ORAL_TABLET | Freq: Every day | ORAL | Status: DC

## 2015-03-28 MED ORDER — TRAZODONE HCL 150 MG PO TABS: 150.0000 mg | ORAL_TABLET | Freq: Every day | ORAL | Status: DC

## 2015-03-28 MED ORDER — LAMOTRIGINE 100 MG PO TABS: 100.0000 mg | ORAL_TABLET | Freq: Two times a day (BID) | ORAL | Status: DC
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
## 2015-03-29 DIAGNOSIS — Z79891 Long term (current) use of opiate analgesic: Secondary | ICD-10-CM | POA: Diagnosis not present

## 2015-03-29 DIAGNOSIS — Z5181 Encounter for therapeutic drug level monitoring: Secondary | ICD-10-CM | POA: Diagnosis not present

## 2015-03-29 DIAGNOSIS — I10 Essential (primary) hypertension: Secondary | ICD-10-CM | POA: Diagnosis not present

## 2015-03-29 DIAGNOSIS — G894 Chronic pain syndrome: Secondary | ICD-10-CM | POA: Diagnosis not present

## 2015-06-07 ENCOUNTER — Ambulatory Visit (INDEPENDENT_AMBULATORY_CARE_PROVIDER_SITE_OTHER): Payer: Medicare Other | Admitting: Neurology

## 2015-06-07 ENCOUNTER — Encounter: Payer: Self-pay | Admitting: Neurology

## 2015-06-07 VITALS — BP 116/76 | HR 68 | Resp 16 | Ht 67.0 in

## 2015-06-07 DIAGNOSIS — N3941 Urge incontinence: Secondary | ICD-10-CM | POA: Diagnosis not present

## 2015-06-07 DIAGNOSIS — R32 Unspecified urinary incontinence: Secondary | ICD-10-CM | POA: Insufficient documentation

## 2015-06-07 DIAGNOSIS — M5412 Radiculopathy, cervical region: Secondary | ICD-10-CM | POA: Diagnosis not present

## 2015-06-07 DIAGNOSIS — M5416 Radiculopathy, lumbar region: Secondary | ICD-10-CM

## 2015-06-07 DIAGNOSIS — G894 Chronic pain syndrome: Secondary | ICD-10-CM

## 2015-06-07 DIAGNOSIS — M797 Fibromyalgia: Secondary | ICD-10-CM | POA: Diagnosis not present

## 2015-06-07 DIAGNOSIS — G4700 Insomnia, unspecified: Secondary | ICD-10-CM | POA: Insufficient documentation

## 2015-06-07 DIAGNOSIS — M542 Cervicalgia: Secondary | ICD-10-CM

## 2015-06-07 MED ORDER — GABAPENTIN 300 MG PO CAPS: ORAL_CAPSULE | ORAL | Status: DC

## 2015-06-07 MED ORDER — IMIPRAMINE HCL 25 MG PO TABS: 25.0000 mg | ORAL_TABLET | Freq: Every day | ORAL | Status: DC
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
## 2015-06-09 ENCOUNTER — Telehealth (HOSPITAL_COMMUNITY): Payer: Self-pay | Admitting: *Deleted

## 2015-06-28 ENCOUNTER — Encounter (HOSPITAL_COMMUNITY): Payer: Self-pay | Admitting: Psychiatry

## 2015-06-28 ENCOUNTER — Ambulatory Visit (INDEPENDENT_AMBULATORY_CARE_PROVIDER_SITE_OTHER): Payer: Medicare Other | Admitting: Psychiatry

## 2015-06-28 VITALS — BP 189/113 | HR 63 | Ht 67.0 in | Wt 181.8 lb

## 2015-06-28 DIAGNOSIS — F3160 Bipolar disorder, current episode mixed, unspecified: Secondary | ICD-10-CM | POA: Diagnosis not present

## 2015-06-28 DIAGNOSIS — F332 Major depressive disorder, recurrent severe without psychotic features: Secondary | ICD-10-CM

## 2015-06-28 DIAGNOSIS — F1010 Alcohol abuse, uncomplicated: Secondary | ICD-10-CM | POA: Diagnosis not present

## 2015-06-28 MED ORDER — TRAZODONE HCL 150 MG PO TABS: 150.0000 mg | ORAL_TABLET | Freq: Every day | ORAL | Status: DC

## 2015-06-28 MED ORDER — LAMOTRIGINE 100 MG PO TABS: 100.0000 mg | ORAL_TABLET | Freq: Two times a day (BID) | ORAL | Status: DC

## 2015-06-28 MED ORDER — ESCITALOPRAM OXALATE 20 MG PO TABS: 20.0000 mg | ORAL_TABLET | Freq: Two times a day (BID) | ORAL | Status: DC
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
## 2015-07-05 ENCOUNTER — Ambulatory Visit
Admission: RE | Admit: 2015-07-05 | Discharge: 2015-07-05 | Disposition: A | Discharge status: Home / Self Care | Payer: Medicare Other | Source: Ambulatory Visit | Attending: Neurology | Admitting: Neurology

## 2015-07-05 DIAGNOSIS — N3941 Urge incontinence: Secondary | ICD-10-CM

## 2015-07-05 DIAGNOSIS — M5412 Radiculopathy, cervical region: Secondary | ICD-10-CM

## 2015-07-05 DIAGNOSIS — M542 Cervicalgia: Secondary | ICD-10-CM | POA: Diagnosis not present

## 2015-07-11 ENCOUNTER — Telehealth: Payer: Self-pay | Admitting: *Deleted

## 2015-07-11 NOTE — Telephone Encounter (Signed)
Patient returned call

## 2015-07-11 NOTE — Telephone Encounter (Signed)
-----   Message from Asa Lenteichard A Sater, MD sent at 07/11/2015  3:33 PM EST ----- Please let her know that the MRI does show arthritis and disc changes in the mid cervical spine but that there was no nerve root compression.

## 2015-07-11 NOTE — Telephone Encounter (Signed)
LMTC./fim 

## 2015-07-13 ENCOUNTER — Telehealth (HOSPITAL_COMMUNITY): Payer: Self-pay | Admitting: *Deleted

## 2015-07-13 NOTE — Telephone Encounter (Signed)
Prior authorization for Escitalopram received. Called and spoke to [place] who states a decision will be faxed in 24-48 hours.

## 2015-07-18 NOTE — Telephone Encounter (Signed)
noted 

## 2015-07-18 NOTE — Telephone Encounter (Signed)
I have spoken with Jennifer Jacobs this afternoon and per RAS, advised that mri showed arthritic and disc changes in mid-neck, but nothing putting pressure on nerves.  She verbalized understanding of same/fim

## 2015-08-26 ENCOUNTER — Ambulatory Visit (INDEPENDENT_AMBULATORY_CARE_PROVIDER_SITE_OTHER): Payer: Medicare Other | Admitting: Psychiatry

## 2015-08-26 ENCOUNTER — Encounter (HOSPITAL_COMMUNITY): Payer: Self-pay | Admitting: Psychiatry

## 2015-08-26 VITALS — BP 138/92 | Ht 67.0 in | Wt 191.0 lb

## 2015-08-26 DIAGNOSIS — F332 Major depressive disorder, recurrent severe without psychotic features: Secondary | ICD-10-CM

## 2015-08-26 DIAGNOSIS — F1010 Alcohol abuse, uncomplicated: Secondary | ICD-10-CM

## 2015-08-26 DIAGNOSIS — F3160 Bipolar disorder, current episode mixed, unspecified: Secondary | ICD-10-CM

## 2015-08-26 MED ORDER — TRAZODONE HCL 150 MG PO TABS: 150.0000 mg | ORAL_TABLET | Freq: Every day | ORAL | Status: DC

## 2015-08-26 MED ORDER — LAMOTRIGINE 100 MG PO TABS: 100.0000 mg | ORAL_TABLET | Freq: Two times a day (BID) | ORAL | Status: DC

## 2015-08-26 MED ORDER — ESCITALOPRAM OXALATE 20 MG PO TABS: 20.0000 mg | ORAL_TABLET | Freq: Two times a day (BID) | ORAL | Status: DC
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
## 2015-09-21 DIAGNOSIS — G894 Chronic pain syndrome: Secondary | ICD-10-CM | POA: Diagnosis not present

## 2015-09-21 DIAGNOSIS — I10 Essential (primary) hypertension: Secondary | ICD-10-CM | POA: Diagnosis not present

## 2015-09-21 DIAGNOSIS — E785 Hyperlipidemia, unspecified: Secondary | ICD-10-CM | POA: Diagnosis not present

## 2015-09-21 DIAGNOSIS — M1990 Unspecified osteoarthritis, unspecified site: Secondary | ICD-10-CM | POA: Diagnosis not present

## 2015-10-10 ENCOUNTER — Ambulatory Visit: Payer: Medicare Other | Admitting: Neurology

## 2015-11-24 ENCOUNTER — Ambulatory Visit (INDEPENDENT_AMBULATORY_CARE_PROVIDER_SITE_OTHER): Payer: Medicare Other | Admitting: Psychiatry

## 2015-11-24 ENCOUNTER — Encounter (HOSPITAL_COMMUNITY): Payer: Self-pay | Admitting: Psychiatry

## 2015-11-24 VITALS — BP 135/99 | HR 78 | Ht 67.0 in | Wt 195.8 lb

## 2015-11-24 DIAGNOSIS — F332 Major depressive disorder, recurrent severe without psychotic features: Secondary | ICD-10-CM

## 2015-11-24 MED ORDER — LAMOTRIGINE 100 MG PO TABS: 100.0000 mg | ORAL_TABLET | Freq: Two times a day (BID) | ORAL | Status: DC

## 2015-11-24 MED ORDER — ESCITALOPRAM OXALATE 20 MG PO TABS: 20.0000 mg | ORAL_TABLET | Freq: Two times a day (BID) | ORAL | Status: DC

## 2015-11-24 MED ORDER — TRAZODONE HCL 150 MG PO TABS: 150.0000 mg | ORAL_TABLET | Freq: Every day | ORAL | Status: DC
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
## 2015-12-28 ENCOUNTER — Telehealth (HOSPITAL_COMMUNITY): Payer: Self-pay | Admitting: *Deleted

## 2015-12-28 NOTE — Telephone Encounter (Signed)
Pt pharmacy faxed refill request for pt Lamotrigine 100 mg . Pt medication last filled 11-24-15 with 2 refills. Pt f/u appt is scheduled for 02-13-16. [facility] fax number is [phone].

## 2015-12-28 NOTE — Telephone Encounter (Signed)
noted 

## 2015-12-28 NOTE — Telephone Encounter (Signed)
Obviously pt should have more refills

## 2016-01-19 ENCOUNTER — Telehealth (HOSPITAL_COMMUNITY): Payer: Self-pay | Admitting: *Deleted

## 2016-01-19 NOTE — Telephone Encounter (Signed)
lmtcb due to needing to resch appt for June 26 due to provider being out of office. Number provided

## 2016-01-20 ENCOUNTER — Telehealth (HOSPITAL_COMMUNITY): Payer: Self-pay | Admitting: *Deleted

## 2016-02-06 ENCOUNTER — Encounter (HOSPITAL_COMMUNITY): Payer: Self-pay | Admitting: Emergency Medicine

## 2016-02-06 ENCOUNTER — Emergency Department (HOSPITAL_COMMUNITY): Payer: Medicare Other

## 2016-02-06 ENCOUNTER — Emergency Department (HOSPITAL_COMMUNITY)
Admission: EM | Admit: 2016-02-06 | Discharge: 2016-02-07 | Disposition: A | Discharge status: Home / Self Care | Payer: Medicare Other | Attending: Emergency Medicine | Admitting: Emergency Medicine

## 2016-02-06 DIAGNOSIS — Y939 Activity, unspecified: Secondary | ICD-10-CM | POA: Diagnosis not present

## 2016-02-06 DIAGNOSIS — Y999 Unspecified external cause status: Secondary | ICD-10-CM | POA: Diagnosis not present

## 2016-02-06 DIAGNOSIS — Z79899 Other long term (current) drug therapy: Secondary | ICD-10-CM | POA: Diagnosis not present

## 2016-02-06 DIAGNOSIS — I10 Essential (primary) hypertension: Secondary | ICD-10-CM | POA: Insufficient documentation

## 2016-02-06 DIAGNOSIS — S20212A Contusion of left front wall of thorax, initial encounter: Secondary | ICD-10-CM | POA: Diagnosis not present

## 2016-02-06 DIAGNOSIS — M1990 Unspecified osteoarthritis, unspecified site: Secondary | ICD-10-CM | POA: Insufficient documentation

## 2016-02-06 DIAGNOSIS — S098XXA Other specified injuries of head, initial encounter: Secondary | ICD-10-CM | POA: Diagnosis not present

## 2016-02-06 DIAGNOSIS — E785 Hyperlipidemia, unspecified: Secondary | ICD-10-CM | POA: Diagnosis not present

## 2016-02-06 DIAGNOSIS — Y929 Unspecified place or not applicable: Secondary | ICD-10-CM | POA: Insufficient documentation

## 2016-02-06 DIAGNOSIS — S199XXA Unspecified injury of neck, initial encounter: Secondary | ICD-10-CM | POA: Diagnosis not present

## 2016-02-06 DIAGNOSIS — I252 Old myocardial infarction: Secondary | ICD-10-CM | POA: Diagnosis not present

## 2016-02-06 DIAGNOSIS — S0990XA Unspecified injury of head, initial encounter: Secondary | ICD-10-CM | POA: Insufficient documentation

## 2016-02-06 DIAGNOSIS — R0781 Pleurodynia: Secondary | ICD-10-CM | POA: Diagnosis not present

## 2016-02-06 DIAGNOSIS — T7491XA Unspecified adult maltreatment, confirmed, initial encounter: Secondary | ICD-10-CM | POA: Diagnosis not present

## 2016-02-06 DIAGNOSIS — S299XXA Unspecified injury of thorax, initial encounter: Secondary | ICD-10-CM | POA: Diagnosis not present

## 2016-02-06 DIAGNOSIS — S0993XA Unspecified injury of face, initial encounter: Secondary | ICD-10-CM | POA: Diagnosis not present

## 2016-02-06 DIAGNOSIS — F17210 Nicotine dependence, cigarettes, uncomplicated: Secondary | ICD-10-CM | POA: Diagnosis not present

## 2016-02-06 DIAGNOSIS — M25551 Pain in right hip: Secondary | ICD-10-CM | POA: Diagnosis not present

## 2016-02-06 DIAGNOSIS — S79911A Unspecified injury of right hip, initial encounter: Secondary | ICD-10-CM | POA: Diagnosis not present

## 2016-02-06 DIAGNOSIS — Z7982 Long term (current) use of aspirin: Secondary | ICD-10-CM | POA: Insufficient documentation

## 2016-02-06 MED ORDER — ONDANSETRON HCL 4 MG PO TABS: 4.0000 mg | ORAL_TABLET | Freq: Four times a day (QID) | ORAL | Status: DC

## 2016-02-06 MED ORDER — NAPROXEN 500 MG PO TABS: 500.0000 mg | ORAL_TABLET | Freq: Two times a day (BID) | ORAL | Status: DC

## 2016-02-06 MED ORDER — PENICILLIN V POTASSIUM 500 MG PO TABS: 500.0000 mg | ORAL_TABLET | Freq: Four times a day (QID) | ORAL | Status: AC

## 2016-02-06 MED ORDER — TETANUS-DIPHTH-ACELL PERTUSSIS 5-2.5-18.5 LF-MCG/0.5 IM SUSP
0.5000 mL | Freq: Once | INTRAMUSCULAR | Status: AC
  Administered 2016-02-06: 0.5 mL via INTRAMUSCULAR
  Filled 2016-02-06: qty 0.5

## 2016-02-06 NOTE — Discharge Instructions (Signed)
Concussion, Adult Follow up with your doctor and dentist. Return to the ED if you develop new or worsening symptoms A concussion, or closed-head injury, is a brain injury caused by a direct blow to the head or by a quick and sudden movement (jolt) of the head or neck. Concussions are usually not life-threatening. Even so, the effects of a concussion can be serious. If you have had a concussion before, you are more likely to experience concussion-like symptoms after a direct blow to the head.  CAUSES  Direct blow to the head, such as from running into another player during a soccer game, being hit in a fight, or hitting your head on a hard surface.  A jolt of the head or neck that causes the brain to move back and forth inside the skull, such as in a car crash. SIGNS AND SYMPTOMS The signs of a concussion can be hard to notice. Early on, they may be missed by you, family members, and health care providers. You may look fine but act or feel differently. Symptoms are usually temporary, but they may last for days, weeks, or even longer. Some symptoms may appear right away while others may not show up for hours or days. Every head injury is different. Symptoms include:  Mild to moderate headaches that will not go away.  A feeling of pressure inside your head.  Having more trouble than usual:  Learning or remembering things you have heard.  Answering questions.  Paying attention or concentrating.  Organizing daily tasks.  Making decisions and solving problems.  Slowness in thinking, acting or reacting, speaking, or reading.  Getting lost or being easily confused.  Feeling tired all the time or lacking energy (fatigued).  Feeling drowsy.  Sleep disturbances.  Sleeping more than usual.  Sleeping less than usual.  Trouble falling asleep.  Trouble sleeping (insomnia).  Loss of balance or feeling lightheaded or dizzy.  Nausea or vomiting.  Numbness or tingling.  Increased  sensitivity to:  Sounds.  Lights.  Distractions.  Vision problems or eyes that tire easily.  Diminished sense of taste or smell.  Ringing in the ears.  Mood changes such as feeling sad or anxious.  Becoming easily irritated or angry for little or no reason.  Lack of motivation.  Seeing or hearing things other people do not see or hear (hallucinations). DIAGNOSIS Your health care provider can usually diagnose a concussion based on a description of your injury and symptoms. He or she will ask whether you passed out (lost consciousness) and whether you are having trouble remembering events that happened right before and during your injury. Your evaluation might include:  A brain scan to look for signs of injury to the brain. Even if the test shows no injury, you may still have a concussion.  Blood tests to be sure other problems are not present. TREATMENT  Concussions are usually treated in an emergency department, in urgent care, or at a clinic. You may need to stay in the hospital overnight for further treatment.  Tell your health care provider if you are taking any medicines, including prescription medicines, over-the-counter medicines, and natural remedies. Some medicines, such as blood thinners (anticoagulants) and aspirin, may increase the chance of complications. Also tell your health care provider whether you have had alcohol or are taking illegal drugs. This information may affect treatment.  Your health care provider will send you home with important instructions to follow.  How fast you will recover from a concussion depends  on many factors. These factors include how severe your concussion is, what part of your brain was injured, your age, and how healthy you were before the concussion.  Most people with mild injuries recover fully. Recovery can take time. In general, recovery is slower in older persons. Also, persons who have had a concussion in the past or have other  medical problems may find that it takes longer to recover from their current injury. HOME CARE INSTRUCTIONS General Instructions  Carefully follow the directions your health care provider gave you.  Only take over-the-counter or prescription medicines for pain, discomfort, or fever as directed by your health care provider.  Take only those medicines that your health care provider has approved.  Do not drink alcohol until your health care provider says you are well enough to do so. Alcohol and certain other drugs may slow your recovery and can put you at risk of further injury.  If it is harder than usual to remember things, write them down.  If you are easily distracted, try to do one thing at a time. For example, do not try to watch TV while fixing dinner.  Talk with family members or close friends when making important decisions.  Keep all follow-up appointments. Repeated evaluation of your symptoms is recommended for your recovery.  Watch your symptoms and tell others to do the same. Complications sometimes occur after a concussion. Older adults with a brain injury may have a higher risk of serious complications, such as a blood clot on the brain.  Tell your teachers, school nurse, school counselor, coach, athletic trainer, or work Production designer, theatre/television/film about your injury, symptoms, and restrictions. Tell them about what you can or cannot do. They should watch for:  Increased problems with attention or concentration.  Increased difficulty remembering or learning new information.  Increased time needed to complete tasks or assignments.  Increased irritability or decreased ability to cope with stress.  Increased symptoms.  Rest. Rest helps the brain to heal. Make sure you:  Get plenty of sleep at night. Avoid staying up late at night.  Keep the same bedtime hours on weekends and weekdays.  Rest during the day. Take daytime naps or rest breaks when you feel tired.  Limit activities that  require a lot of thought or concentration. These include:  Doing homework or job-related work.  Watching TV.  Working on the computer.  Avoid any situation where there is potential for another head injury (football, hockey, soccer, basketball, martial arts, downhill snow sports and horseback riding). Your condition will get worse every time you experience a concussion. You should avoid these activities until you are evaluated by the appropriate follow-up health care providers. Returning To Your Regular Activities You will need to return to your normal activities slowly, not all at once. You must give your body and brain enough time for recovery.  Do not return to sports or other athletic activities until your health care provider tells you it is safe to do so.  Ask your health care provider when you can drive, ride a bicycle, or operate heavy machinery. Your ability to react may be slower after a brain injury. Never do these activities if you are dizzy.  Ask your health care provider about when you can return to work or school. Preventing Another Concussion It is very important to avoid another brain injury, especially before you have recovered. In rare cases, another injury can lead to permanent brain damage, brain swelling, or death. The risk of  this is greatest during the first 7-10 days after a head injury. Avoid injuries by:  Wearing a seat belt when riding in a car.  Drinking alcohol only in moderation.  Wearing a helmet when biking, skiing, skateboarding, skating, or doing similar activities.  Avoiding activities that could lead to a second concussion, such as contact or recreational sports, until your health care provider says it is okay.  Taking safety measures in your home.  Remove clutter and tripping hazards from floors and stairways.  Use grab bars in bathrooms and handrails by stairs.  Place non-slip mats on floors and in bathtubs.  Improve lighting in dim  areas. SEEK MEDICAL CARE IF:  You have increased problems paying attention or concentrating.  You have increased difficulty remembering or learning new information.  You need more time to complete tasks or assignments than before.  You have increased irritability or decreased ability to cope with stress.  You have more symptoms than before. Seek medical care if you have any of the following symptoms for more than 2 weeks after your injury:  Lasting (chronic) headaches.  Dizziness or balance problems.  Nausea.  Vision problems.  Increased sensitivity to noise or light.  Depression or mood swings.  Anxiety or irritability.  Memory problems.  Difficulty concentrating or paying attention.  Sleep problems.  Feeling tired all the time. SEEK IMMEDIATE MEDICAL CARE IF:  You have severe or worsening headaches. These may be a sign of a blood clot in the brain.  You have weakness (even if only in one hand, leg, or part of the face).  You have numbness.  You have decreased coordination.  You vomit repeatedly.  You have increased sleepiness.  One pupil is larger than the other.  You have convulsions.  You have slurred speech.  You have increased confusion. This may be a sign of a blood clot in the brain.  You have increased restlessness, agitation, or irritability.  You are unable to recognize people or places.  You have neck pain.  It is difficult to wake you up.  You have unusual behavior changes.  You lose consciousness. MAKE SURE YOU:  Understand these instructions.  Will watch your condition.  Will get help right away if you are not doing well or get worse.   This information is not intended to replace advice given to you by your health care provider. Make sure you discuss any questions you have with your health care provider.   Document Released: 10/27/2003 Document Revised: 08/27/2014 Document Reviewed: 02/26/2013 Elsevier Interactive Patient  Education 2016 [employer].   [employer] is a great source of information about community services available.  Access by dialing 2-1-1 from anywhere in [place], or by website -  PooledIncome.pl.   Other Local Resources (Updated 08/2015)  Dental  Care   Services    Phone Number and Address  Cost  [facility] For children 23 - 59 years of age:   Cleaning  Tooth brushing/flossing instruction  Sealants, fillings, crowns  Extractions  Emergency treatment  [phone] [address] [place], [place] [address] Charges based on family income.  Medicaid and some insurance plans accepted.     [facility] - [facility], fillings, crowns  Extractions  Emergency treatment [phone] [address] [place], [place]  Pregnant women 21 years of age or older with a Medicaid card  [facility] - High Armed forces technical officer, fillings, crowns  Extractions  Emergency treatment [phone] [address] [place], [place] Pregnant women 31 years of age or older with a Medicaid card  [facility] Department of Health - [facility] For children 45 - 18 years of age:   Cleaning  Tooth brushing/flossing instruction  Sealants, fillings, crowns  Extractions  Emergency treatment Limited orthodontic services for patients with Medicaid [phone] [address] [place], [place] [address] Medicaid and [facility] Health Choice cover for children up to age 16 and pregnant women.  Parents of children up to age 50 without Medicaid pay a reduced fee at time of service.  [facility] Department of [employer] For children 52 - 18 years of age:   Cleaning  Tooth brushing/flossing instruction  Sealants, fillings, crowns  Extractions  Emergency treatment Limited orthodontic services  for patients with Medicaid [phone] [address] [place], [place].  Medicaid and [place] Health Choice cover for children up to age 58 and pregnant women.  Parents of children up to age 32 without Medicaid pay a reduced fee.  [facility]  Sealants, fillings, crowns  Extractions  Hours: Tuesdays and Thursdays, 4:15 - 8 pm [phone] [address] [place], [place] [address] Services free of charge to [facility] residents ages 18-64 who do not have health insurance, Medicare, [place], or [place] benefits and fall within federal poverty guidelines  [employer]    Provides dental care in addition to primary medical care, nutritional counseling, and pharmacy:  Nurse, mental health, fillings, crowns  Extractions                  [phone] [facility], [address] [place], [place]  [phone] [facility], [address] [place], [place]  [phone] [facility] [place], [place]  [phone] [facility], [address] [place], [place]  [phone] [facility] [address] [place], [place] Accepts [place], [place], most insurance.  Also provides services available to all with fees adjusted based on ability to pay.    [facility]  Cleaning  Tooth brushing/flossing instruction  Sealants, fillings, crowns  Extractions  Emergency treatment Hours: Tuesdays, Thursdays, and Fridays from 8 am to 5 pm by appointment only. [phone] [address] [place], [place] [address] [facility] residents with Medicaid (depending on eligibility) and children with [facility] Health Choice - call for more information.  [facility]  Extractions only  Hours: 2nd and 4th Thursday of each month from 6:30 am - 9 am.   [phone] ext.  [phone] [place], [place] [address] Ages 52 and older only.  Patients are seen on a first come, first served basis.  [employer]  [employer]  Extractions  Orthodontics  Endodontics  Implants/Crowns/Bridges  Complete and partial dentures [phone] [place], [place] Patients must complete an application for services.  There is often a waiting list.

## 2016-02-06 NOTE — ED Notes (Signed)
Pt drank full glass of water. Tolerated well. Pt ambulated once around nursing station. Tolerated well but said she felt slightly "woozy".
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
## 2016-02-13 ENCOUNTER — Ambulatory Visit (HOSPITAL_COMMUNITY): Payer: Self-pay | Admitting: Psychiatry

## 2016-02-24 DIAGNOSIS — I973 Postprocedural hypertension: Secondary | ICD-10-CM | POA: Diagnosis not present

## 2016-03-05 ENCOUNTER — Telehealth (HOSPITAL_COMMUNITY): Payer: Self-pay | Admitting: *Deleted

## 2016-03-05 NOTE — Telephone Encounter (Signed)
Pt pharmacy requesting refills for all medications Dr. Tenny Crawoss prescribes. Office called pt to verify pt need refills and pt did confirmed and stated she is currently out and is scheduled for 03-20-16. Office rescheduled appt for pt and pt verbalized understanding.

## 2016-03-08 ENCOUNTER — Ambulatory Visit (INDEPENDENT_AMBULATORY_CARE_PROVIDER_SITE_OTHER): Payer: Medicare Other | Admitting: Psychiatry

## 2016-03-08 ENCOUNTER — Encounter (HOSPITAL_COMMUNITY): Payer: Self-pay | Admitting: Psychiatry

## 2016-03-08 VITALS — BP 136/96 | HR 60 | Ht 67.0 in | Wt 184.0 lb

## 2016-03-08 DIAGNOSIS — F332 Major depressive disorder, recurrent severe without psychotic features: Secondary | ICD-10-CM | POA: Diagnosis not present

## 2016-03-08 MED ORDER — LAMOTRIGINE 100 MG PO TABS: 100.0000 mg | ORAL_TABLET | Freq: Two times a day (BID) | ORAL | Status: DC

## 2016-03-08 MED ORDER — ESCITALOPRAM OXALATE 20 MG PO TABS: 20.0000 mg | ORAL_TABLET | Freq: Two times a day (BID) | ORAL | Status: DC

## 2016-03-08 MED ORDER — TRAZODONE HCL 150 MG PO TABS: 150.0000 mg | ORAL_TABLET | Freq: Every day | ORAL | Status: DC
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
## 2016-03-20 ENCOUNTER — Ambulatory Visit (HOSPITAL_COMMUNITY): Payer: Self-pay | Admitting: Psychiatry

## 2016-06-05 ENCOUNTER — Encounter: Payer: Self-pay | Admitting: Family Medicine

## 2016-06-05 ENCOUNTER — Ambulatory Visit (INDEPENDENT_AMBULATORY_CARE_PROVIDER_SITE_OTHER): Payer: Medicare Other | Admitting: Family Medicine

## 2016-06-05 VITALS — BP 158/98 | HR 76 | Temp 98.8°F | Resp 16 | Ht 67.0 in | Wt 183.1 lb

## 2016-06-05 DIAGNOSIS — E785 Hyperlipidemia, unspecified: Secondary | ICD-10-CM

## 2016-06-05 DIAGNOSIS — Z23 Encounter for immunization: Secondary | ICD-10-CM | POA: Diagnosis not present

## 2016-06-05 DIAGNOSIS — E559 Vitamin D deficiency, unspecified: Secondary | ICD-10-CM

## 2016-06-05 DIAGNOSIS — G894 Chronic pain syndrome: Secondary | ICD-10-CM | POA: Diagnosis not present

## 2016-06-05 DIAGNOSIS — M5416 Radiculopathy, lumbar region: Secondary | ICD-10-CM

## 2016-06-05 DIAGNOSIS — Z7689 Persons encountering health services in other specified circumstances: Secondary | ICD-10-CM | POA: Diagnosis not present

## 2016-06-05 DIAGNOSIS — I10 Essential (primary) hypertension: Secondary | ICD-10-CM

## 2016-06-05 MED ORDER — MELOXICAM 15 MG PO TABS: 15.0000 mg | ORAL_TABLET | Freq: Every day | ORAL | 3 refills | Status: DC

## 2016-06-05 MED ORDER — LISINOPRIL-HYDROCHLOROTHIAZIDE 20-25 MG PO TABS: 1.0000 | ORAL_TABLET | Freq: Every day | ORAL | 3 refills | Status: DC

## 2016-06-05 MED ORDER — METOPROLOL TARTRATE 25 MG PO TABS
25.0000 mg | ORAL_TABLET | Freq: Two times a day (BID) | ORAL | 3 refills | Status: DC
Start: 2016-06-05 — End: 2016-11-27

## 2016-06-05 MED ORDER — GABAPENTIN 400 MG PO CAPS: ORAL_CAPSULE | ORAL | 3 refills | Status: DC

## 2016-06-05 NOTE — Progress Notes (Signed)
Chief Complaint  Patient presents with  . Establish Care   New to establish  Patient has been in the care of her primary care doctor in [place]. She states she is not on a yearly physical examination, or health maintenance such as Pap, mammogram, immunizations, or colonoscopy orders. Will request those old records. It appears this doctor primarily offered her pain management. He has her on gabapentin 3 times a day and Vicoprofen for chronic leg pain. It is uncertain the etiology for leg pain. Her diagnosis is both lumbar degenerative disc disease and lumbar radiculopathy as well as claudication. She states she can't even walk a block without having to stop because of pain in her calves and her thighs. She does not know if she's had vascular studies of her legs. She has not seen a back specialist. She has not had any conservative therapy for back pain including physical therapy chiropractic care or injections. She also has chronic neck pain and cervical radiculitis. Patient has ongoing mental illness. She is diagnosis having a bipolar disorder, schizophrenia, major depression, alcohol abuse, and insomnia. She sees psychiatrist regularly. She is compliant with her medications. Along with her alcoholism she also regularly uses marijuana. I explained to the patient with her medical history I was not comfortable providing her with any narcotic medications for her back pain. I will see her only for her primary care needs, and refer her for evaluation of back and leg pain to try to improve her condition. Patient has long-standing hypertension. She is out of her blood pressure medication. She states she is having some headaches because her blood pressure has been elevated. She requests medicine refill. She denies any cardiovascular complications.  Patient Active Problem List   Diagnosis Date Noted  . Insomnia 06/07/2015  . Urinary incontinence 06/07/2015  . Chronic pain syndrome 03/04/2015  .  Fibromyalgia 03/04/2015  . Neck pain 03/04/2015  . Cervical radiculitis 03/04/2015  . Lower back pain 03/04/2015  . Lumbar radicular pain 03/04/2015  . Major depression 07/05/2014  . Alcohol abuse 07/05/2014  . Liver cyst 03/18/2014  . Colon cancer screening 03/18/2014  . DOE (dyspnea on exertion) 05/09/2013  . Claudication, class I ([place]) 03/18/2013  . PND (paroxysmal nocturnal dyspnea) 03/18/2013  . Postural dizziness 03/18/2013  . Hypertension   . Smoker unmotivated to quit   . Dyslipidemia     Outpatient Encounter Prescriptions as of 06/05/2016  Medication Sig  . aspirin EC 81 MG tablet Take 81 mg by mouth daily.  Marland Kitchen docusate sodium (COLACE) 100 MG capsule Take 100 mg by mouth daily as needed for mild constipation (constipation).   Marland Kitchen donepezil (ARICEPT) 5 MG tablet Take 5 mg by mouth at bedtime.  Marland Kitchen escitalopram (LEXAPRO) 20 MG tablet Take 1 tablet (20 mg total) by mouth 2 (two) times daily.  Marland Kitchen gabapentin (NEURONTIN) 400 MG capsule One pill three times a day for nerve pain  . lamoTRIgine (LAMICTAL) 100 MG tablet Take 1 tablet (100 mg total) by mouth 2 (two) times daily.  Marland Kitchen lisinopril-hydrochlorothiazide (PRINZIDE,ZESTORETIC) 20-25 MG tablet Take 1 tablet by mouth daily.  . metoprolol tartrate (LOPRESSOR) 25 MG tablet Take 1 tablet (25 mg total) by mouth 2 (two) times daily.  . Multiple Vitamins-Minerals (CENTRUM SILVER ADULT 50+ PO) Take 1 tablet by mouth daily.  . traZODone (DESYREL) 150 MG tablet Take 1 tablet (150 mg total) by mouth at bedtime.  . meloxicam (MOBIC) 15 MG tablet Take 1 tablet (15 mg total) by mouth daily.  No facility-administered encounter medications on file as of 06/05/2016.     Past Medical History:  Diagnosis Date  . Allergy   . Anxiety   . Anxiety and depression   . Arthritis   . Depression   . Dyslipidemia   . Fibromyalgia   . Hepatic cyst   . Hyperlipidemia   . Hypertension   . MI (myocardial infarction) 2007   By patient report, not  substantiated by recent nuclear stress test.  . Neuromuscular disorder ([place])   . Seizure disorder ([place])    none since 2002  . Seizures ([place])   . Smoker unmotivated to quit    Quit for 3 years and then restarted  . Substance abuse    ETOH  . Vision abnormalities     Past Surgical History:  Procedure Laterality Date  . ELBOW ARTHROPLASTY Left    rods  . Exercise tolerance test  04/27/2013   CPET-MET: Submaximal effort (0.96, with goal of greater than 1.09) -- patient states that her effort was limited due to knee pain.  This makes the rest of the interpretation difficult: Peak VO2 - 10.5 (59% moderate), peak 02-pulse -  7..11 (low); heart rate 114 (73% -chronic incompetence considered);; PFTs normal   . MOUTH SURGERY    . NM MYOVIEW LTD  05/19/2013   LexiScan: EF 80%, no evidence of ischemia or infarction  . TRANSTHORACIC ECHOCARDIOGRAM  03/25/2013   EF 55-60%, no regional wall motion abnormalities; normal diastolic parameters, normal pulmonary pressures.  No valvular lesions noted.  Essentially normal echo     Social History   Social History  . Marital status: Legally Separated    Spouse name: N/A  . Number of children: 4  . Years of education: 12   Occupational History  . 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 Smoking status: Current Every Day Smoker    Packs/day: 0.10    Types: Cigarettes  . Smokeless tobacco: Never Used     Comment: one pack every 3 months  . Alcohol use Yes     Comment: occasionally  . Drug use:     Types: Marijuana     Comment: occasionally  . Sexual activity: Not Currently   Other Topics Concern  . Not on file   Social History Narrative   Lives alone with dog   Works as a Microbiologist     Does drink about 5 alcoholic beverages a week.            Husband: Coralyn [facility] - separated   Daughters: Gilmore Laroche (passed away) , Novella Rob    Family History  Problem Relation Age of Onset  . Heart attack Father 79    . Hypertension Father   . Alcohol abuse Father   . Heart attack Maternal Grandmother   . Cancer Maternal Grandfather     type unknown  . Cancer Paternal Grandmother     type unknown  . Depression Mother   . Alcohol abuse Mother   . Ovarian cancer Paternal Aunt   . Cancer Paternal Aunt     ovarian  . Prostate cancer Paternal Uncle   . Stomach cancer Paternal Aunt   . Diabetes Daughter   . Heart disease Daughter 14    heart attack  . Depression Sister   . Alcohol abuse Brother   . Depression Sister   . Alcohol abuse Sister   . Depression Sister   .  Alcohol abuse Sister   . Alcohol abuse Brother   . Alcohol abuse Son     Review of Systems  Constitutional: Negative for chills, fever and weight loss.  HENT: Negative for congestion and hearing loss.        When Blood pressure high  Eyes: Negative for blurred vision and pain.  Respiratory: Negative for cough and shortness of breath.   Cardiovascular: Negative for chest pain, palpitations and leg swelling.  Gastrointestinal: Negative for abdominal pain, constipation, diarrhea and heartburn.  Genitourinary: Negative for dysuria and frequency.  Musculoskeletal: Positive for back pain, joint pain, myalgias and neck pain. Negative for falls.       Fibromyalgia, chronic pain syndrome, neck and back degenerative disc disease  Neurological: Positive for focal weakness and headaches. Negative for dizziness and seizures.       States weakness in the legs  Psychiatric/Behavioral: Positive for depression, memory loss and substance abuse. Negative for hallucinations. The patient is nervous/anxious and has insomnia.     BP (!) 158/98 (BP Location: Left Arm, Patient Position: Sitting, Cuff Size: Normal)   Pulse 76   Temp 98.8 F (37.1 C) (Oral)   Resp 16   Ht '5\' 7"'  (1.702 m)   Wt 183 lb 1.9 oz (83.1 kg)   SpO2 98%   BMI 28.68 kg/m   Physical Exam  Constitutional: She is oriented to person, place, and time. She appears  well-developed and well-nourished.  Pleasant. Cooperative.  HENT:  Head: Normocephalic and atraumatic.  Right Ear: External ear normal.  Left Ear: External ear normal.  Mouth/Throat: Oropharynx is clear and moist.  Eyes: Conjunctivae are normal. Pupils are equal, round, and reactive to light.  Neck: Normal range of motion. Neck supple. No thyromegaly present.  Cardiovascular: Normal rate, regular rhythm and normal heart sounds.   Pulmonary/Chest: Effort normal and breath sounds normal. No respiratory distress.  Abdominal: Soft. Bowel sounds are normal.  Truncal obesity  Musculoskeletal: Normal range of motion. She exhibits no edema.  No focal findings  Lymphadenopathy:    She has no cervical adenopathy.  Neurological: She is alert and oriented to person, place, and time. She displays abnormal reflex.  Diminished reflexes   Skin: Skin is warm and dry.  Psychiatric: She has a normal mood and affect. Her behavior is normal. Thought content normal.  Nursing note and vitals reviewed.  ASSESSMENT/PLAN:  1. Dyslipidemia  2. Essential hypertension Uncontrolled. Currently off medication. Will restart prior lisinopril, hydrochlorothiazide, metoprolol - Comprehensive metabolic panel - Lipid panel - VITAMIN D 25 Hydroxy (Vit-D Deficiency, Fractures)  3. Chronic pain syndrome Will manage with gabapentin, Mobic. We will consider in future referral for back and vascular testing  4. Encounter to establish care with new doctor  5. Lumbar radicular pain  6. Vitamin D deficiency-  VITAMIN D 25 Hydroxy (Vit-D Deficiency, Fractures)  7. Need for prophylactic vaccination and inoculation against influenza  - Flu Vaccine QUAD 36+ mos IM  8. Need for pneumococcal vaccination  - Pneumococcal conjugate vaccine 13-valent IM   Patient Instructions  Need old records I will refill medication I will not provide narcotic pain management Flu shot today prevnar today (pneumonia)  For pain take  the gabapentin 400 mg three tomes a day Take the meloxicam once a day with food ( this is an anti inflammatory pain medicine)  See me in 3-4 weeks for follow up Try to stop smoking    Raylene Everts, MD

## 2016-06-05 NOTE — Patient Instructions (Addendum)
Need old records I will refill medication I will not provide narcotic pain management Flu shot today prevnar today (pneumonia)  For pain take the gabapentin 400 mg three tomes a day Take the meloxicam once a day with food ( this is an anti inflammatory pain medicine)  See me in 3-4 weeks for follow up Try to stop smoking

## 2016-06-08 ENCOUNTER — Ambulatory Visit (HOSPITAL_COMMUNITY): Payer: Medicare Other | Admitting: Psychiatry

## 2016-06-08 ENCOUNTER — Encounter (HOSPITAL_COMMUNITY): Payer: Self-pay

## 2016-06-11 ENCOUNTER — Telehealth (HOSPITAL_COMMUNITY): Payer: Self-pay | Admitting: *Deleted

## 2016-06-12 ENCOUNTER — Encounter (HOSPITAL_COMMUNITY): Payer: Self-pay | Admitting: Psychiatry

## 2016-06-12 ENCOUNTER — Ambulatory Visit (INDEPENDENT_AMBULATORY_CARE_PROVIDER_SITE_OTHER): Payer: Medicare Other | Admitting: Psychiatry

## 2016-06-12 VITALS — BP 144/84 | HR 73 | Ht 67.0 in | Wt 181.4 lb

## 2016-06-12 DIAGNOSIS — Z888 Allergy status to other drugs, medicaments and biological substances status: Secondary | ICD-10-CM | POA: Diagnosis not present

## 2016-06-12 DIAGNOSIS — Z79899 Other long term (current) drug therapy: Secondary | ICD-10-CM

## 2016-06-12 DIAGNOSIS — Z833 Family history of diabetes mellitus: Secondary | ICD-10-CM

## 2016-06-12 DIAGNOSIS — Z8489 Family history of other specified conditions: Secondary | ICD-10-CM

## 2016-06-12 DIAGNOSIS — F332 Major depressive disorder, recurrent severe without psychotic features: Secondary | ICD-10-CM | POA: Diagnosis not present

## 2016-06-12 DIAGNOSIS — Z7982 Long term (current) use of aspirin: Secondary | ICD-10-CM

## 2016-06-12 DIAGNOSIS — Z8249 Family history of ischemic heart disease and other diseases of the circulatory system: Secondary | ICD-10-CM

## 2016-06-12 MED ORDER — TRAZODONE HCL 150 MG PO TABS: 150.0000 mg | ORAL_TABLET | Freq: Every day | ORAL | 2 refills | Status: DC

## 2016-06-12 MED ORDER — ESCITALOPRAM OXALATE 20 MG PO TABS: 20.0000 mg | ORAL_TABLET | Freq: Two times a day (BID) | ORAL | 2 refills | Status: DC

## 2016-06-12 MED ORDER — LAMOTRIGINE 100 MG PO TABS: 100.0000 mg | ORAL_TABLET | Freq: Two times a day (BID) | ORAL | 2 refills | Status: DC
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
## 2016-06-25 ENCOUNTER — Telehealth (HOSPITAL_COMMUNITY): Payer: Self-pay | Admitting: *Deleted

## 2016-06-25 NOTE — Telephone Encounter (Signed)
Pt called to verify appt and informed her with appt for jan 23 at 1pm.

## 2016-06-26 ENCOUNTER — Encounter: Payer: Self-pay | Admitting: Family Medicine

## 2016-06-26 ENCOUNTER — Ambulatory Visit (INDEPENDENT_AMBULATORY_CARE_PROVIDER_SITE_OTHER): Payer: Medicare Other | Admitting: Family Medicine

## 2016-06-26 VITALS — BP 118/72 | HR 64 | Temp 97.9°F | Resp 18 | Ht 67.0 in | Wt 181.1 lb

## 2016-06-26 DIAGNOSIS — M79605 Pain in left leg: Secondary | ICD-10-CM | POA: Diagnosis not present

## 2016-06-26 DIAGNOSIS — M79604 Pain in right leg: Secondary | ICD-10-CM

## 2016-06-26 DIAGNOSIS — E538 Deficiency of other specified B group vitamins: Secondary | ICD-10-CM | POA: Diagnosis not present

## 2016-06-26 DIAGNOSIS — I714 Abdominal aortic aneurysm, without rupture, unspecified: Secondary | ICD-10-CM

## 2016-06-26 DIAGNOSIS — I739 Peripheral vascular disease, unspecified: Secondary | ICD-10-CM

## 2016-06-26 DIAGNOSIS — M5416 Radiculopathy, lumbar region: Secondary | ICD-10-CM

## 2016-06-26 DIAGNOSIS — Z1159 Encounter for screening for other viral diseases: Secondary | ICD-10-CM | POA: Diagnosis not present

## 2016-06-26 DIAGNOSIS — I10 Essential (primary) hypertension: Secondary | ICD-10-CM

## 2016-06-26 LAB — VITAMIN B12: VITAMIN B 12: 515 pg/mL (ref 200–1100)

## 2016-06-26 NOTE — Progress Notes (Signed)
Chief Complaint  Patient presents with  . Follow-up    3 week htn   Here for routine follow up She is back on her BP medication with good result.  No side effect noted. She didn't tolerate the mobic due to heartburn Still limited by bilateral leg pain I reviewed her lumbar films from 2 y ago and she does have lumbar DJD with facet arthropathy and spondylolistheses.  She may be having neuropathic pain.  I will order NCS to further evaluate She has a diagnosis of claudication.  In addiction she has a finding of a AAA on her last lumbar films at 3.1cm, and I do not see an ultrasound to follow up.  I will order. No recent labs and these are ordered. Discussed the need again to stop smoking and she has cut down Is not drinking alcohol at this point.  Patient Active Problem List   Diagnosis Date Noted  . Bilateral leg pain 06/26/2016  . AAA (abdominal aortic aneurysm) without rupture (HCC) 06/26/2016  . Insomnia 06/07/2015  . Urinary incontinence 06/07/2015  . Chronic pain syndrome 03/04/2015  . Fibromyalgia 03/04/2015  . Neck pain 03/04/2015  . Cervical radiculitis 03/04/2015  . Lower back pain 03/04/2015  . Lumbar radicular pain 03/04/2015  . Major depression 07/05/2014  . Alcohol abuse 07/05/2014  . Liver cyst 03/18/2014  . Claudication, class I (HCC) 03/18/2013  . Hypertension   . Smoker unmotivated to quit     Outpatient Encounter Prescriptions as of 06/26/2016  Medication Sig  . aspirin EC 81 MG tablet Take 81 mg by mouth daily.  Marland Kitchen. docusate sodium (COLACE) 100 MG capsule Take 100 mg by mouth daily as needed for mild constipation (constipation).   Marland Kitchen. donepezil (ARICEPT) 5 MG tablet Take 5 mg by mouth at bedtime.  Marland Kitchen. escitalopram (LEXAPRO) 20 MG tablet Take 1 tablet (20 mg total) by mouth 2 (two) times daily.  Marland Kitchen. gabapentin (NEURONTIN) 400 MG capsule One pill three times a day for nerve pain  . lamoTRIgine (LAMICTAL) 100 MG tablet Take 1 tablet (100 mg total) by mouth 2  (two) times daily.  Marland Kitchen. lisinopril-hydrochlorothiazide (PRINZIDE,ZESTORETIC) 20-25 MG tablet Take 1 tablet by mouth daily.  . metoprolol tartrate (LOPRESSOR) 25 MG tablet Take 1 tablet (25 mg total) by mouth 2 (two) times daily.  . Multiple Vitamins-Minerals (CENTRUM SILVER ADULT 50+ PO) Take 1 tablet by mouth daily.  . traZODone (DESYREL) 150 MG tablet Take 1 tablet (150 mg total) by mouth at bedtime.  . [DISCONTINUED] meloxicam (MOBIC) 15 MG tablet Take 1 tablet (15 mg total) by mouth daily. (Patient not taking: Reported on 06/26/2016)   No facility-administered encounter medications on file as of 06/26/2016.     Allergies  Allergen Reactions  . Prozac [Fluoxetine Hcl] Other (See Comments)    Headaches    Review of Systems  Constitutional: Negative for activity change, appetite change and fatigue.  HENT: Negative for congestion and dental problem.   Eyes: Negative for photophobia and visual disturbance.  Respiratory: Negative for cough and shortness of breath.   Cardiovascular: Negative for chest pain, palpitations and leg swelling.  Gastrointestinal: Positive for abdominal pain. Negative for constipation and diarrhea.       Heartburn with the mobic  Genitourinary: Negative for difficulty urinating and frequency.  Musculoskeletal: Positive for arthralgias, back pain and gait problem.       Pain in back from mid scapula to pelvis, occas, pain in both legs, entire leg  Neurological:  Positive for weakness and numbness.       Pain and numbness both legs  Psychiatric/Behavioral: Positive for dysphoric mood and sleep disturbance. Negative for self-injury and suicidal ideas. The patient is not nervous/anxious.        Stable - under care psych    BP 118/72 (BP Location: Left Arm, Patient Position: Sitting, Cuff Size: Normal)   Pulse 64   Temp 97.9 F (36.6 C) (Oral)   Resp 18   Ht 5\' 7"  (1.702 m)   Wt 181 lb 1.3 oz (82.1 kg)   SpO2 95%   BMI 28.36 kg/m   Physical Exam    Constitutional: She is oriented to person, place, and time. She appears well-developed and well-nourished. No distress.  HENT:  Head: Normocephalic.  Mouth/Throat: Oropharynx is clear and moist.  Eyes: Conjunctivae are normal. Pupils are equal, round, and reactive to light.  Neck: Normal range of motion. Neck supple.  Cardiovascular: Normal rate, regular rhythm and normal heart sounds.   Ped pulses decreased.  Pulmonary/Chest: Effort normal and breath sounds normal. No respiratory distress.  Abdominal: Soft. Bowel sounds are normal. She exhibits no distension.  Musculoskeletal: Normal range of motion. She exhibits no edema.  Crepitus with knee motion.  No calf tenderness.  Lymphadenopathy:    She has no cervical adenopathy.  Neurological: She is alert and oriented to person, place, and time. She displays normal reflexes.  Antalgic gait  Psychiatric: She has a normal mood and affect. Her behavior is normal.    ASSESSMENT/PLAN:  1. Essential hypertension Well controlled 2. Lumbar radicular pain chronic 3. Bilateral leg pain Vascular vs neurogenic.  Not limited to joints - arthritis. - Nerve conduction test; Future  4. Claudication, class I (HCC) - VAS US ABI WITH/WO TBI; Future  5. AAA (abdominal aortic aneurysm) without rupture (HCC) - US Abdomen Limited; Future  6. Vitamin B 12 deficiency - Vitamin B12  7. Need for hepatitis C screening test - Hepatitis C antibody   Patient Instructions  I have ordered follow up testing for your leg pain  I have ordered ultrasound of aorta and legs  Also nerve conduction tests of legs  Labs today I will send you a letter with your test results.  If there is anything of concern, we will call right away.  See me in 2 months Call sooner for problems    Eustace MooreYvonne Sue Koren [place]heim, MD

## 2016-06-26 NOTE — Patient Instructions (Signed)
I have ordered follow up testing for your leg pain  I have ordered ultrasound of aorta and legs  Also nerve conduction tests of legs  Labs today I will send you a letter with your test results.  If there is anything of concern, we will call right away.  See me in 2 months Call sooner for problems

## 2016-06-27 LAB — HEPATITIS C ANTIBODY: HCV Ab: NEGATIVE

## 2016-06-27 NOTE — Addendum Note (Signed)
Addended by: Kandis FantasiaSLADE, COURTNEY B on: 06/27/2016 11:34 AM   Modules accepted: Orders

## 2016-08-27 ENCOUNTER — Ambulatory Visit: Payer: Self-pay | Admitting: Family Medicine

## 2016-08-27 ENCOUNTER — Encounter: Payer: Self-pay | Admitting: Family Medicine

## 2016-09-10 ENCOUNTER — Ambulatory Visit (INDEPENDENT_AMBULATORY_CARE_PROVIDER_SITE_OTHER): Payer: Medicare Other | Admitting: Family Medicine

## 2016-09-10 ENCOUNTER — Encounter: Payer: Self-pay | Admitting: Family Medicine

## 2016-09-10 VITALS — BP 102/68 | HR 60 | Temp 97.9°F | Resp 16 | Ht 67.0 in | Wt 182.1 lb

## 2016-09-10 DIAGNOSIS — M79605 Pain in left leg: Secondary | ICD-10-CM

## 2016-09-10 DIAGNOSIS — M5440 Lumbago with sciatica, unspecified side: Secondary | ICD-10-CM

## 2016-09-10 DIAGNOSIS — G8929 Other chronic pain: Secondary | ICD-10-CM

## 2016-09-10 DIAGNOSIS — I10 Essential (primary) hypertension: Secondary | ICD-10-CM

## 2016-09-10 DIAGNOSIS — F17200 Nicotine dependence, unspecified, uncomplicated: Secondary | ICD-10-CM

## 2016-09-10 DIAGNOSIS — M79604 Pain in right leg: Secondary | ICD-10-CM

## 2016-09-10 DIAGNOSIS — M5416 Radiculopathy, lumbar region: Secondary | ICD-10-CM | POA: Diagnosis not present

## 2016-09-10 DIAGNOSIS — F1010 Alcohol abuse, uncomplicated: Secondary | ICD-10-CM | POA: Diagnosis not present

## 2016-09-10 MED ORDER — GABAPENTIN 300 MG PO CAPS: 600.0000 mg | ORAL_CAPSULE | Freq: Three times a day (TID) | ORAL | 5 refills | Status: DC

## 2016-09-10 NOTE — Patient Instructions (Signed)
Continue to try to reduce the smoking  I am increasing the gabapentin to 600 mg three times a day I am referring to orthopedic for back and leg pain  Need blood tests today please  See me in 3 months

## 2016-09-10 NOTE — Progress Notes (Signed)
Chief Complaint  Patient presents with  . Follow-up    2 months   Not drinking alcohol Smokes a couple of cigarettes every couple of days.  A pack lasts a week.  Does this when stressed/sad/lonely She is staying in hour house more and not going out.  Is going to see Dr Tenny Crawoss in psychiatry tomorrow.  Will discuss. Biggest issue is pain in low back in to both legs and limited ambulation due to back - hip - leg pain .  Known lumbar DJD with spondylosothesis.  No MRI of lumbar spine.  Takes gabapentin 300 TID and this helps - some.  Will increase. Needs to get her labs done.  Agrees to go today.  May  Need a statin with her vascular disease and smoking. BP is good. Compliant with meds Weight is stable No falls  Patient Active Problem List   Diagnosis Date Noted  . Bilateral leg pain 06/26/2016  . AAA (abdominal aortic aneurysm) without rupture (HCC) 06/26/2016  . Insomnia 06/07/2015  . Urinary incontinence 06/07/2015  . Chronic pain syndrome 03/04/2015  . Fibromyalgia 03/04/2015  . Neck pain 03/04/2015  . Cervical radiculitis 03/04/2015  . Lower back pain 03/04/2015  . Lumbar radicular pain 03/04/2015  . Major depression 07/05/2014  . Alcohol abuse 07/05/2014  . Liver cyst 03/18/2014  . Claudication, class I (HCC) 03/18/2013  . Hypertension   . Smoker unmotivated to quit     Outpatient Encounter Prescriptions as of 09/10/2016  Medication Sig  . aspirin EC 81 MG tablet Take 81 mg by mouth daily.  Marland Kitchen. docusate sodium (COLACE) 100 MG capsule Take 100 mg by mouth daily as needed for mild constipation (constipation).   Marland Kitchen. donepezil (ARICEPT) 5 MG tablet Take 5 mg by mouth at bedtime.  Marland Kitchen. escitalopram (LEXAPRO) 20 MG tablet Take 1 tablet (20 mg total) by mouth 2 (two) times daily.  Marland Kitchen. lamoTRIgine (LAMICTAL) 100 MG tablet Take 1 tablet (100 mg total) by mouth 2 (two) times daily.  Marland Kitchen. lisinopril-hydrochlorothiazide (PRINZIDE,ZESTORETIC) 20-25 MG tablet Take 1 tablet by mouth daily.  .  metoprolol tartrate (LOPRESSOR) 25 MG tablet Take 1 tablet (25 mg total) by mouth 2 (two) times daily.  . Multiple Vitamins-Minerals (CENTRUM SILVER ADULT 50+ PO) Take 1 tablet by mouth daily.  . traZODone (DESYREL) 150 MG tablet Take 1 tablet (150 mg total) by mouth at bedtime.  . [DISCONTINUED] gabapentin (NEURONTIN) 400 MG capsule One pill three times a day for nerve pain  . gabapentin (NEURONTIN) 300 MG capsule Take 2 capsules (600 mg total) by mouth 3 (three) times daily.   No facility-administered encounter medications on file as of 09/10/2016.     Allergies  Allergen Reactions  . Prozac [Fluoxetine Hcl] Other (See Comments)    Headaches    Review of Systems  Constitutional: Negative for activity change, appetite change and fatigue.  HENT: Negative for congestion and dental problem.   Eyes: Negative for photophobia and visual disturbance.  Respiratory: Negative for cough and shortness of breath.   Cardiovascular: Negative for chest pain, palpitations and leg swelling.  Gastrointestinal: Positive for abdominal pain. Negative for constipation and diarrhea.       Heartburn with the mobic  Genitourinary: Negative for difficulty urinating and frequency.  Musculoskeletal: Positive for arthralgias, back pain, gait problem, neck pain and neck stiffness.       Pain in back from waist to pelvis, occas, pain in both legs, entire leg  Neurological: Positive for weakness and  numbness.       Pain and numbness both legs  Psychiatric/Behavioral: Positive for dysphoric mood and sleep disturbance. Negative for self-injury and suicidal ideas. The patient is not nervous/anxious.        Under care psych- more reclusive    BP 102/68 (BP Location: Right Arm, Patient Position: Sitting, Cuff Size: Normal)   Pulse 60   Temp 97.9 F (36.6 C) (Oral)   Resp 16   Ht 5\' 7"  (1.702 m)   Wt 182 lb 1.9 oz (82.6 kg)   SpO2 99%   BMI 28.52 kg/m   Physical Exam  Constitutional: She is oriented to person,  place, and time. She appears well-developed and well-nourished. No distress.  HENT:  Head: Normocephalic.  Mouth/Throat: Oropharynx is clear and moist.  Eyes: Conjunctivae are normal. Pupils are equal, round, and reactive to light.  Neck: Normal range of motion. Neck supple.  Cardiovascular: Normal rate, regular rhythm and normal heart sounds.   Ped pulses decreased.  Pulmonary/Chest: Effort normal and breath sounds normal. No respiratory distress.  Abdominal: Soft. Bowel sounds are normal. She exhibits no distension.  Musculoskeletal: Normal range of motion. She exhibits no edema.  Crepitus with knee motion.  No calf tenderness.  Lymphadenopathy:    She has no cervical adenopathy.  Neurological: She is alert and oriented to person, place, and time. She displays normal reflexes. She exhibits abnormal muscle tone.  Antalgic gait.  Muscle wasting legs.  Hip flexors weak.  Reflexes diminished. No clonus.  Psychiatric: Her behavior is normal.  Depressed affect and mood    ASSESSMENT/PLAN:  1. Chronic midline low back pain with sciatica, sciatica laterality unspecified  - Ambulatory referral to Orthopedic Surgery  2. Lumbar radicular pain  - Ambulatory referral to Orthopedic Surgery 3. Essential hypertension.  Controlled 4. alchohol abuse in remission 5. bilat leg pain 6. Smoker - in process of quitting per pateint  Patient Instructions  Continue to try to reduce the smoking  I am increasing the gabapentin to 600 mg three times a day I am referring to orthopedic for back and leg pain  Need blood tests today please  See me in 3 months   Eustace Moore, MD

## 2016-09-11 ENCOUNTER — Encounter (HOSPITAL_COMMUNITY): Payer: Self-pay | Admitting: Psychiatry

## 2016-09-11 ENCOUNTER — Ambulatory Visit (INDEPENDENT_AMBULATORY_CARE_PROVIDER_SITE_OTHER): Payer: Medicare Other | Admitting: Psychiatry

## 2016-09-11 VITALS — BP 125/85 | HR 62 | Ht 67.0 in | Wt 180.4 lb

## 2016-09-11 DIAGNOSIS — F332 Major depressive disorder, recurrent severe without psychotic features: Secondary | ICD-10-CM

## 2016-09-11 DIAGNOSIS — Z8041 Family history of malignant neoplasm of ovary: Secondary | ICD-10-CM

## 2016-09-11 DIAGNOSIS — Z7982 Long term (current) use of aspirin: Secondary | ICD-10-CM | POA: Diagnosis not present

## 2016-09-11 DIAGNOSIS — Z888 Allergy status to other drugs, medicaments and biological substances status: Secondary | ICD-10-CM | POA: Diagnosis not present

## 2016-09-11 DIAGNOSIS — Z818 Family history of other mental and behavioral disorders: Secondary | ICD-10-CM

## 2016-09-11 DIAGNOSIS — Z79899 Other long term (current) drug therapy: Secondary | ICD-10-CM

## 2016-09-11 DIAGNOSIS — Z811 Family history of alcohol abuse and dependence: Secondary | ICD-10-CM

## 2016-09-11 DIAGNOSIS — Z8042 Family history of malignant neoplasm of prostate: Secondary | ICD-10-CM

## 2016-09-11 DIAGNOSIS — Z800 Family history of malignant neoplasm of digestive organs: Secondary | ICD-10-CM

## 2016-09-11 DIAGNOSIS — Z8249 Family history of ischemic heart disease and other diseases of the circulatory system: Secondary | ICD-10-CM

## 2016-09-11 MED ORDER — TRAZODONE HCL 150 MG PO TABS: 150.0000 mg | ORAL_TABLET | Freq: Every day | ORAL | 2 refills | Status: DC

## 2016-09-11 MED ORDER — DULOXETINE HCL 60 MG PO CPEP: 60.0000 mg | ORAL_CAPSULE | Freq: Every day | ORAL | 2 refills | Status: DC

## 2016-09-11 MED ORDER — LAMOTRIGINE 100 MG PO TABS: 100.0000 mg | ORAL_TABLET | Freq: Two times a day (BID) | ORAL | 2 refills | Status: DC
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
## 2016-10-02 DIAGNOSIS — M5441 Lumbago with sciatica, right side: Secondary | ICD-10-CM | POA: Diagnosis not present

## 2016-10-02 DIAGNOSIS — G8929 Other chronic pain: Secondary | ICD-10-CM | POA: Diagnosis not present

## 2016-10-02 DIAGNOSIS — M5442 Lumbago with sciatica, left side: Secondary | ICD-10-CM | POA: Diagnosis not present

## 2016-10-11 ENCOUNTER — Other Ambulatory Visit (HOSPITAL_COMMUNITY): Payer: Self-pay | Admitting: Psychiatry

## 2016-10-11 ENCOUNTER — Telehealth (HOSPITAL_COMMUNITY): Payer: Self-pay | Admitting: *Deleted

## 2016-10-11 MED ORDER — ESCITALOPRAM OXALATE 20 MG PO TABS: 20.0000 mg | ORAL_TABLET | Freq: Every day | ORAL | 0 refills | Status: DC

## 2016-10-11 NOTE — Telephone Encounter (Signed)
Pt called stating that her Cymbalta is giving her a really bad headaches. Per pt it's been going on for 3-4 weeks now. Pt number is [phone] home [phone]

## 2016-10-11 NOTE — Telephone Encounter (Signed)
Discussed with patient. She states worsening headache since starting duloxetine. She reports headache is more of a concern than depression, and denies SI. She prefers to be back on lexapro; she was advised to discontinue duloxetine and restart lexapro 20 mg daily (not 20 mg BID as she used to). Patient is instructed to contact the office if she has any other questions/worsening depression.   - discontinue duloxetine - start lexapro 20 mg daily (Ordered Lexapro 20 mg for 30 days.)

## 2016-10-12 NOTE — Telephone Encounter (Signed)
noted 

## 2016-10-15 NOTE — Telephone Encounter (Signed)
noted 

## 2016-10-22 ENCOUNTER — Ambulatory Visit (INDEPENDENT_AMBULATORY_CARE_PROVIDER_SITE_OTHER): Payer: Medicare Other | Admitting: Psychiatry

## 2016-10-22 ENCOUNTER — Encounter (HOSPITAL_COMMUNITY): Payer: Self-pay | Admitting: Psychiatry

## 2016-10-22 VITALS — BP 128/84 | HR 65 | Ht 67.5 in | Wt 181.0 lb

## 2016-10-22 DIAGNOSIS — Z79899 Other long term (current) drug therapy: Secondary | ICD-10-CM

## 2016-10-22 DIAGNOSIS — Z888 Allergy status to other drugs, medicaments and biological substances status: Secondary | ICD-10-CM | POA: Diagnosis not present

## 2016-10-22 DIAGNOSIS — Z818 Family history of other mental and behavioral disorders: Secondary | ICD-10-CM

## 2016-10-22 DIAGNOSIS — F332 Major depressive disorder, recurrent severe without psychotic features: Secondary | ICD-10-CM

## 2016-10-22 DIAGNOSIS — F3160 Bipolar disorder, current episode mixed, unspecified: Secondary | ICD-10-CM | POA: Diagnosis not present

## 2016-10-22 DIAGNOSIS — Z7982 Long term (current) use of aspirin: Secondary | ICD-10-CM | POA: Diagnosis not present

## 2016-10-22 DIAGNOSIS — Z811 Family history of alcohol abuse and dependence: Secondary | ICD-10-CM

## 2016-10-22 DIAGNOSIS — F1010 Alcohol abuse, uncomplicated: Secondary | ICD-10-CM | POA: Diagnosis not present

## 2016-10-22 MED ORDER — ESCITALOPRAM OXALATE 20 MG PO TABS: 20.0000 mg | ORAL_TABLET | Freq: Two times a day (BID) | ORAL | 2 refills | Status: DC

## 2016-10-22 MED ORDER — LAMOTRIGINE 100 MG PO TABS: 100.0000 mg | ORAL_TABLET | Freq: Two times a day (BID) | ORAL | 2 refills | Status: DC

## 2016-10-22 MED ORDER — TRAZODONE HCL 150 MG PO TABS: 150.0000 mg | ORAL_TABLET | Freq: Every day | ORAL | 2 refills | Status: DC
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
## 2016-10-23 ENCOUNTER — Ambulatory Visit (HOSPITAL_COMMUNITY): Payer: Self-pay | Admitting: Psychiatry

## 2016-11-17 ENCOUNTER — Encounter (HOSPITAL_COMMUNITY): Payer: Self-pay | Admitting: *Deleted

## 2016-11-17 ENCOUNTER — Inpatient Hospital Stay (HOSPITAL_COMMUNITY)
Admission: EM | Admit: 2016-11-17 | Discharge: 2016-11-22 | DRG: 026 | Disposition: A | Discharge status: Rehabilitation Facility | Payer: Medicare Other | Attending: Neurological Surgery | Admitting: Neurological Surgery

## 2016-11-17 ENCOUNTER — Emergency Department (HOSPITAL_COMMUNITY): Payer: Medicare Other

## 2016-11-17 DIAGNOSIS — R6883 Chills (without fever): Secondary | ICD-10-CM | POA: Diagnosis not present

## 2016-11-17 DIAGNOSIS — R519 Headache, unspecified: Secondary | ICD-10-CM

## 2016-11-17 DIAGNOSIS — F329 Major depressive disorder, single episode, unspecified: Secondary | ICD-10-CM | POA: Diagnosis present

## 2016-11-17 DIAGNOSIS — F1010 Alcohol abuse, uncomplicated: Secondary | ICD-10-CM | POA: Diagnosis present

## 2016-11-17 DIAGNOSIS — I10 Essential (primary) hypertension: Secondary | ICD-10-CM | POA: Diagnosis not present

## 2016-11-17 DIAGNOSIS — F17210 Nicotine dependence, cigarettes, uncomplicated: Secondary | ICD-10-CM | POA: Diagnosis present

## 2016-11-17 DIAGNOSIS — Z8673 Personal history of transient ischemic attack (TIA), and cerebral infarction without residual deficits: Secondary | ICD-10-CM

## 2016-11-17 DIAGNOSIS — H4902 Third [oculomotor] nerve palsy, left eye: Secondary | ICD-10-CM | POA: Diagnosis present

## 2016-11-17 DIAGNOSIS — G894 Chronic pain syndrome: Secondary | ICD-10-CM | POA: Diagnosis present

## 2016-11-17 DIAGNOSIS — G40909 Epilepsy, unspecified, not intractable, without status epilepticus: Secondary | ICD-10-CM

## 2016-11-17 DIAGNOSIS — G44201 Tension-type headache, unspecified, intractable: Secondary | ICD-10-CM

## 2016-11-17 DIAGNOSIS — D62 Acute posthemorrhagic anemia: Secondary | ICD-10-CM

## 2016-11-17 DIAGNOSIS — R51 Headache: Secondary | ICD-10-CM

## 2016-11-17 DIAGNOSIS — R52 Pain, unspecified: Secondary | ICD-10-CM | POA: Diagnosis not present

## 2016-11-17 DIAGNOSIS — D72829 Elevated white blood cell count, unspecified: Secondary | ICD-10-CM

## 2016-11-17 DIAGNOSIS — G4489 Other headache syndrome: Secondary | ICD-10-CM | POA: Diagnosis not present

## 2016-11-17 DIAGNOSIS — I252 Old myocardial infarction: Secondary | ICD-10-CM

## 2016-11-17 DIAGNOSIS — Z811 Family history of alcohol abuse and dependence: Secondary | ICD-10-CM

## 2016-11-17 DIAGNOSIS — Z8249 Family history of ischemic heart disease and other diseases of the circulatory system: Secondary | ICD-10-CM

## 2016-11-17 DIAGNOSIS — Z7982 Long term (current) use of aspirin: Secondary | ICD-10-CM

## 2016-11-17 DIAGNOSIS — I671 Cerebral aneurysm, nonruptured: Secondary | ICD-10-CM | POA: Diagnosis not present

## 2016-11-17 DIAGNOSIS — I714 Abdominal aortic aneurysm, without rupture: Secondary | ICD-10-CM | POA: Diagnosis present

## 2016-11-17 DIAGNOSIS — M797 Fibromyalgia: Secondary | ICD-10-CM | POA: Diagnosis present

## 2016-11-17 DIAGNOSIS — Z79899 Other long term (current) drug therapy: Secondary | ICD-10-CM

## 2016-11-17 DIAGNOSIS — Z972 Presence of dental prosthetic device (complete) (partial): Secondary | ICD-10-CM

## 2016-11-17 DIAGNOSIS — Z96622 Presence of left artificial elbow joint: Secondary | ICD-10-CM | POA: Diagnosis present

## 2016-11-17 DIAGNOSIS — E785 Hyperlipidemia, unspecified: Secondary | ICD-10-CM | POA: Diagnosis present

## 2016-11-17 DIAGNOSIS — I739 Peripheral vascular disease, unspecified: Secondary | ICD-10-CM | POA: Diagnosis present

## 2016-11-17 DIAGNOSIS — Z888 Allergy status to other drugs, medicaments and biological substances status: Secondary | ICD-10-CM

## 2016-11-17 DIAGNOSIS — F419 Anxiety disorder, unspecified: Secondary | ICD-10-CM | POA: Diagnosis present

## 2016-11-17 HISTORY — DX: Acute posthemorrhagic anemia: D62

## 2016-11-17 MED ORDER — DIPHENHYDRAMINE HCL 50 MG/ML IJ SOLN
25.0000 mg | Freq: Once | INTRAMUSCULAR | Status: AC
  Administered 2016-11-18: 25 mg via INTRAVENOUS
  Filled 2016-11-17: qty 1

## 2016-11-17 MED ORDER — METOCLOPRAMIDE HCL 5 MG/ML IJ SOLN
10.0000 mg | Freq: Once | INTRAMUSCULAR | Status: AC
  Administered 2016-11-18: 10 mg via INTRAVENOUS
  Filled 2016-11-17: qty 2

## 2016-11-17 NOTE — ED Triage Notes (Signed)
Pt arrived by EMS from home. Complete of headache that started today after putting out mothballs. Pt has used mothballs before w/o having headache.

## 2016-11-17 NOTE — ED Provider Notes (Signed)
[place] DEPT Provider Note   CSN: [id number] Arrival date & time: 11/17/16  2303  By signing my name below, I, Julien Nordmann, attest that this documentation has been prepared under the direction and in the presence of Ripley Fraise, MD.  Electronically Signed: Julien Nordmann, ED Scribe. 11/17/16. 11:53 PM.    History   Chief Complaint Chief Complaint  Patient presents with  . Headache   The history is provided by the patient and the EMS personnel. No language interpreter was used.  Headache   This is a new problem. The current episode started 3 to 5 hours ago. The problem occurs constantly. The problem has been gradually worsening. The headache is associated with an unknown factor. Pain location: generalized. The pain is moderate. The pain does not radiate. Pertinent negatives include no fever, no nausea and no vomiting. She has tried nothing for the symptoms.   HPI Comments: Jennifer Jacobs is a 71 y.o. female brought in by ambulance, who has a PMhx of HLD, HTN, MI, ETOH abuse presents to the Emergency Department complaining of an acute onset, progressively worsening, generalized headache that began this evening. Pt says her headache started after she put out some moth balls. She reports associated blurry vision, photophobia, weakness in BUE/BLE, and mild chest pain. She did not lose consciousness. Pt has not had a similar headache in the past. She denies fever and vomiting.   Past Medical History:  Diagnosis Date  . Allergy   . Anxiety   . Anxiety and depression   . Arthritis   . Depression   . Dyslipidemia   . Fibromyalgia   . Hepatic cyst   . Hyperlipidemia   . Hypertension   . MI (myocardial infarction) 2007   By patient report, not substantiated by recent nuclear stress test.  . Neuromuscular disorder ([place])   . Seizure disorder ([place])    none since 2002  . Seizures ([place])   . Smoker unmotivated to quit    Quit for 3 years and then restarted  . Substance abuse     ETOH  . Vision abnormalities     Patient Active Problem List   Diagnosis Date Noted  . Bilateral leg pain 06/26/2016  . AAA (abdominal aortic aneurysm) without rupture ([place]) 06/26/2016  . Insomnia 06/07/2015  . Urinary incontinence 06/07/2015  . Chronic pain syndrome 03/04/2015  . Fibromyalgia 03/04/2015  . Neck pain 03/04/2015  . Cervical radiculitis 03/04/2015  . Lower back pain 03/04/2015  . Lumbar radicular pain 03/04/2015  . Major depression 07/05/2014  . Alcohol abuse 07/05/2014  . Liver cyst 03/18/2014  . Claudication, class I ([place]) 03/18/2013  . Hypertension   . Smoker unmotivated to quit     Past Surgical History:  Procedure Laterality Date  . ELBOW ARTHROPLASTY Left    rods  . Exercise tolerance test  04/27/2013   CPET-MET: Submaximal effort (0.96, with goal of greater than 1.09) -- patient states that her effort was limited due to knee pain.  This makes the rest of the interpretation difficult: Peak VO2 - 10.5 (59% moderate), peak 02-pulse -  7..11 (low); heart rate 114 (73% -chronic incompetence considered);; PFTs normal   . MOUTH SURGERY    . NM MYOVIEW LTD  05/19/2013   LexiScan: EF 80%, no evidence of ischemia or infarction  . TRANSTHORACIC ECHOCARDIOGRAM  03/25/2013   EF 55-60%, no regional wall motion abnormalities; normal diastolic parameters, normal pulmonary pressures.  No valvular lesions noted.  Essentially normal echo  OB History    No data available       Home Medications    Prior to Admission medications   Medication Sig Start Date End Date Taking? Authorizing Provider  aspirin EC 81 MG tablet Take 81 mg by mouth daily.    Historical Provider, MD  docusate sodium (COLACE) 100 MG capsule Take 100 mg by mouth daily as needed for mild constipation (constipation).     Historical Provider, MD  escitalopram (LEXAPRO) 20 MG tablet Take 1 tablet (20 mg total) by mouth 2 (two) times daily. 10/22/16   Cloria Spring, MD  gabapentin (NEURONTIN) 300  MG capsule Take 2 capsules (600 mg total) by mouth 3 (three) times daily. 09/10/16   Raylene Everts, MD  lamoTRIgine (LAMICTAL) 100 MG tablet Take 1 tablet (100 mg total) by mouth 2 (two) times daily. 10/22/16 10/22/17  Cloria Spring, MD  lisinopril-hydrochlorothiazide (PRINZIDE,ZESTORETIC) 20-25 MG tablet Take 1 tablet by mouth daily. 06/05/16   Raylene Everts, MD  metoprolol tartrate (LOPRESSOR) 25 MG tablet Take 1 tablet (25 mg total) by mouth 2 (two) times daily. 06/05/16   Raylene Everts, MD  Multiple Vitamins-Minerals (CENTRUM SILVER ADULT 50+ PO) Take 1 tablet by mouth daily.    Historical Provider, MD  traZODone (DESYREL) 150 MG tablet Take 1 tablet (150 mg total) by mouth at bedtime. 10/22/16   Cloria Spring, MD    Family History Family History  Problem Relation Age of Onset  . Heart attack Father 36  . Hypertension Father   . Alcohol abuse Father   . Heart attack Maternal Grandmother   . Cancer Maternal Grandfather     type unknown  . Cancer Paternal Grandmother     type unknown  . Depression Mother   . Alcohol abuse Mother   . Ovarian cancer Paternal Aunt   . Cancer Paternal Aunt     ovarian  . Prostate cancer Paternal Uncle   . Stomach cancer Paternal Aunt   . Diabetes Daughter   . Heart disease Daughter 93    heart attack  . Depression Sister   . Alcohol abuse Brother   . Depression Sister   . Alcohol abuse Sister   . Depression Sister   . Alcohol abuse Sister   . Alcohol abuse Brother   . Alcohol abuse Son     Social History Social History  Substance Use Topics  . Smoking status: Current Every Day Smoker    Packs/day: 0.10    Types: Cigarettes  . Smokeless tobacco: Never Used     Comment: one pack every 3 months  . Alcohol use No     Comment: occasionally     Allergies   Prozac [fluoxetine hcl]   Review of Systems Review of Systems  Constitutional: Negative for fever.  Cardiovascular: Positive for chest pain (mild).  Gastrointestinal:  Negative for nausea and vomiting.  Neurological: Positive for weakness and headaches.  All other systems reviewed and are negative.    Physical Exam Updated Vital Signs BP 133/79 (BP Location: Right Arm)   Pulse 61   Temp 97.7 F (36.5 C) (Oral)   Resp 20   Ht '5\' 8"'  (1.727 m)   Wt 180 lb (81.6 kg)   SpO2 100%   BMI 27.37 kg/m   Physical Exam  CONSTITUTIONAL: Well developed/well nourished, uncomfortable appearing HEAD: Normocephalic/atraumatic EYES: limited as patient is in hallway bed keeping eyes closed ENMT: Mucous membranes moist NECK: supple no meningeal signs, no  bruits CV: S1/S2 noted, no murmurs/rubs/gallops noted LUNGS: Lungs are clear to auscultation bilaterally, no apparent distress ABDOMEN: soft, nontender, no rebound or guarding GU:no cva tenderness NEURO:Awake/alert, face symmetric, no arm or leg drift is noted Sensation to light touch intact in all extremities EXTREMITIES: pulses normal, full ROM SKIN: warm, color normal PSYCH: no abnormalities of mood noted, alert and oriented to situation   ED Treatments / Results  DIAGNOSTIC STUDIES: Oxygen Saturation is 100% on RA, normal by my interpretation.  COORDINATION OF CARE:  11:49 PM Discussed treatment plan with pt at bedside and pt agreed to plan.  Labs (all labs ordered are listed, but only abnormal results are displayed) Labs Reviewed  BASIC METABOLIC PANEL - Abnormal; Notable for the following:       Result Value   Potassium 3.1 (*)    Glucose, Bld 143 (*)    Creatinine, Ser 1.04 (*)    GFR calc non Af Amer 54 (*)    All other components within normal limits  CBC WITH DIFFERENTIAL/PLATELET - Abnormal; Notable for the following:    Neutro Abs 8.3 (*)    All other components within normal limits  PROTIME-INR  ETHANOL  APTT    EKG  EKG Interpretation  Date/Time:  Sunday November 18 2016 00:22:29 EDT Ventricular Rate:  73 PR Interval:    QRS Duration: 122 QT Interval:  435 QTC  Calculation: 480 R Axis:   -59 Text Interpretation:  Sinus rhythm Nonspecific IVCD with LAD Left ventricular hypertrophy Borderline T abnormalities, lateral leads No significant change since last tracing Confirmed by Christy Gentles  MD, [place] ([id number]) on 11/18/2016 12:29:41 AM       Radiology Ct Angio Head W Or Wo Contrast  Result Date: 11/18/2016 CLINICAL DATA:  Headache after handling mothballs, LEFT pupil dilatation. History of seizures, hypertension, neuromuscular disorder, substance abuse. EXAM: CT ANGIOGRAPHY HEAD AND NECK TECHNIQUE: Multidetector CT imaging of the head and neck was performed using the standard protocol during bolus administration of intravenous contrast. Multiplanar CT image reconstructions and MIPs were obtained to evaluate the vascular anatomy. Carotid stenosis measurements (when applicable) are obtained utilizing NASCET criteria, using the distal internal carotid diameter as the denominator. CONTRAST:  100 cc Isovue 370 COMPARISON:  CT HEAD November 18, 2016 at 0000 hours an CT cervical spine February 06, 2016 FINDINGS: CTA NECK AORTIC ARCH: Ascending aorta is at least 4 cm in transaxial dimension, incompletely assessed. Mild bulbous conformity of the LEFT descending aorta, incompletely evaluated. The origins of the innominate, left Common carotid artery and subclavian artery are widely patent. RIGHT CAROTID SYSTEM: Common carotid artery is widely patent, coursing in a straight line fashion. Normal appearance of the carotid bifurcation without hemodynamically significant stenosis by NASCET criteria, trace calcific atherosclerosis. Normal appearance of the included internal carotid artery. LEFT CAROTID SYSTEM: Common carotid artery is widely patent, coursing in a straight line fashion. Normal appearance of the carotid bifurcation without hemodynamically significant stenosis by NASCET criteria. Normal appearance of the included internal carotid artery. VERTEBRAL ARTERIES:Codominant vertebral  artery's. Normal appearance of the vertebral arteries, which appear widely patent. SKELETON: No acute osseous process though bone windows have not been submitted. Severe C5-6 and C6-7 degenerative discs, moderate to severe at C4-5. Grade 1 C3-4 and C4-5 anterolisthesis. Poor dentition with multiple periapical lucency/abscess. OTHER NECK: Soft tissues of the neck are non-acute though, not tailored for evaluation. 11 mm RIGHT thyroid nodule below size followup recommendation. CTA HEAD ANTERIOR CIRCULATION: Patent cervical internal carotid arteries, petrous, cavernous  and supra clinoid internal carotid arteries. Mildly patulous bilateral cavernous segments. 4 mm wide neck, patent superiorly directed intact aneurysm. Mild luminal irregularity of the anterior and middle cerebral arteries. No large vessel occlusion, hemodynamically significant stenosis, dissection, contrast extravasation. POSTERIOR CIRCULATION: Normal appearance of the vertebral arteries, vertebrobasilar junction and basilar artery, as well as main branch vessels. Normal appearance of the posterior cerebral arteries. Small bilateral posterior communicating arteries present. 8 x 8 x 5 mm (transverse by AP by CC) bilobed posteriorly directed intact wide necked patent LEFT posterior communicating artery origin aneurysm. RIGHT posterior communicating artery origin infundibulum. Blister aneurysm RIGHT anterior genu of the internal carotid artery. Mild luminal irregularity of the posterior cerebral arteries. No large vessel occlusion, hemodynamically significant stenosis, dissection, contrast extravasation. VENOUS SINUSES: Major dural venous sinuses are patent though not tailored for evaluation on this angiographic examination. ANATOMIC VARIANTS: None. DELAYED PHASE: No abnormal intracranial enhancement. MIP images reviewed. IMPRESSION: CTA NECK: No hemodynamically significant stenosis or acute vascular process. **An incidental finding of potential clinical  significance has been found. Recommend annual imaging followup by CTA or MRA. This recommendation follows 2010 ACCF/AHA/AATS/ACR/ASA/SCA/SCAI/SIR/STS/SVM Guidelines for the Diagnosis and Management of Patients with Thoracic Aortic Disease. Circulation. 2010; 121: X517-G017** CTA HEAD: Mild atherosclerosis without hemodynamically significant stenosis or emergent large vessel occlusion. 5 mm intact LEFT MCA bifurcation aneurysm. 8 x 8 x 5 mm intact LEFT posterior communicating artery origin aneurysm. Recommend neurovascular consultation on a nonemergent basis. Electronically Signed   By: Elon Alas M.D.   On: 11/18/2016 02:55   Ct Head Wo Contrast  Result Date: 11/18/2016 CLINICAL DATA:  Headache for 1 day. EXAM: CT HEAD WITHOUT CONTRAST TECHNIQUE: Contiguous axial images were obtained from the base of the skull through the vertex without intravenous contrast. COMPARISON:  Head CT 02/06/2016 FINDINGS: Brain: No evidence of acute infarction, hemorrhage, hydrocephalus, extra-axial collection or mass lesion/mass effect. Mild atrophy is normal for age. Vascular: Atherosclerosis of skullbase vasculature without hyperdense vessel or abnormal calcification. Skull: Normal. Negative for fracture or focal lesion. Sinuses/Orbits: Paranasal sinuses and mastoid air cells are clear. The visualized orbits are unremarkable. Other: None. IMPRESSION: No acute intracranial abnormality. Electronically Signed   By: Jeb Levering M.D.   On: 11/18/2016 00:45   Ct Angio Neck W And/or Wo Contrast  Result Date: 11/18/2016 CLINICAL DATA:  Headache after handling mothballs, LEFT pupil dilatation. History of seizures, hypertension, neuromuscular disorder, substance abuse. EXAM: CT ANGIOGRAPHY HEAD AND NECK TECHNIQUE: Multidetector CT imaging of the head and neck was performed using the standard protocol during bolus administration of intravenous contrast. Multiplanar CT image reconstructions and MIPs were obtained to evaluate the  vascular anatomy. Carotid stenosis measurements (when applicable) are obtained utilizing NASCET criteria, using the distal internal carotid diameter as the denominator. CONTRAST:  100 cc Isovue 370 COMPARISON:  CT HEAD November 18, 2016 at 0000 hours an CT cervical spine February 06, 2016 FINDINGS: CTA NECK AORTIC ARCH: Ascending aorta is at least 4 cm in transaxial dimension, incompletely assessed. Mild bulbous conformity of the LEFT descending aorta, incompletely evaluated. The origins of the innominate, left Common carotid artery and subclavian artery are widely patent. RIGHT CAROTID SYSTEM: Common carotid artery is widely patent, coursing in a straight line fashion. Normal appearance of the carotid bifurcation without hemodynamically significant stenosis by NASCET criteria, trace calcific atherosclerosis. Normal appearance of the included internal carotid artery. LEFT CAROTID SYSTEM: Common carotid artery is widely patent, coursing in a straight line fashion. Normal appearance of the carotid bifurcation  without hemodynamically significant stenosis by NASCET criteria. Normal appearance of the included internal carotid artery. VERTEBRAL ARTERIES:Codominant vertebral artery's. Normal appearance of the vertebral arteries, which appear widely patent. SKELETON: No acute osseous process though bone windows have not been submitted. Severe C5-6 and C6-7 degenerative discs, moderate to severe at C4-5. Grade 1 C3-4 and C4-5 anterolisthesis. Poor dentition with multiple periapical lucency/abscess. OTHER NECK: Soft tissues of the neck are non-acute though, not tailored for evaluation. 11 mm RIGHT thyroid nodule below size followup recommendation. CTA HEAD ANTERIOR CIRCULATION: Patent cervical internal carotid arteries, petrous, cavernous and supra clinoid internal carotid arteries. Mildly patulous bilateral cavernous segments. 4 mm wide neck, patent superiorly directed intact aneurysm. Mild luminal irregularity of the anterior and  middle cerebral arteries. No large vessel occlusion, hemodynamically significant stenosis, dissection, contrast extravasation. POSTERIOR CIRCULATION: Normal appearance of the vertebral arteries, vertebrobasilar junction and basilar artery, as well as main branch vessels. Normal appearance of the posterior cerebral arteries. Small bilateral posterior communicating arteries present. 8 x 8 x 5 mm (transverse by AP by CC) bilobed posteriorly directed intact wide necked patent LEFT posterior communicating artery origin aneurysm. RIGHT posterior communicating artery origin infundibulum. Blister aneurysm RIGHT anterior genu of the internal carotid artery. Mild luminal irregularity of the posterior cerebral arteries. No large vessel occlusion, hemodynamically significant stenosis, dissection, contrast extravasation. VENOUS SINUSES: Major dural venous sinuses are patent though not tailored for evaluation on this angiographic examination. ANATOMIC VARIANTS: None. DELAYED PHASE: No abnormal intracranial enhancement. MIP images reviewed. IMPRESSION: CTA NECK: No hemodynamically significant stenosis or acute vascular process. **An incidental finding of potential clinical significance has been found. Recommend annual imaging followup by CTA or MRA. This recommendation follows 2010 ACCF/AHA/AATS/ACR/ASA/SCA/SCAI/SIR/STS/SVM Guidelines for the Diagnosis and Management of Patients with Thoracic Aortic Disease. Circulation. 2010; 121: A416-S063** CTA HEAD: Mild atherosclerosis without hemodynamically significant stenosis or emergent large vessel occlusion. 5 mm intact LEFT MCA bifurcation aneurysm. 8 x 8 x 5 mm intact LEFT posterior communicating artery origin aneurysm. Recommend neurovascular consultation on a nonemergent basis. Electronically Signed   By: Elon Alas M.D.   On: 11/18/2016 02:55    Procedures Procedures  CRITICAL CARE Performed by: Sharyon Cable Total critical care time: 31 minutes Critical care  time was exclusive of separately billable procedures and treating other patients. Critical care was necessary to treat or prevent imminent or life-threatening deterioration. Critical care was time spent personally by me on the following activities: development of treatment plan with patient and/or surrogate as well as nursing, discussions with consultants, evaluation of patient's response to treatment, examination of patient, obtaining history from patient or surrogate, ordering and performing treatments and interventions, ordering and review of laboratory studies, ordering and review of radiographic studies, pulse oximetry and re-evaluation of patient's condition. PATIENT WITH ACUTE HEADACHE, FOUND TO HAVE ANEURYSM REQUIRING TRANSFER TO NEURO ICU  Medications Ordered in ED Medications  metoCLOPramide (REGLAN) injection 10 mg (10 mg Intravenous Given 11/18/16 0121)  diphenhydrAMINE (BENADRYL) injection 25 mg (25 mg Intravenous Given 11/18/16 0122)  iopamidol (ISOVUE-370) 76 % injection 100 mL (100 mLs Intravenous Contrast Given 11/18/16 0221)     Initial Impression / Assessment and Plan / ED Course  I have reviewed the triage vital signs and the nursing notes.  Pertinent labs & imaging results that were available during my care of the patient were reviewed by me and considered in my medical decision making (see chart for details).     12:04 AM Pt awake/alert but reporting significant HA after putting  out mothballs Due to age/history, will treat HA and obtain CT head 1:45 AM Pt reports HA improved Repeat exam - she has no facial droop or arm/leg drift However, she appears to have LEFT ptosis and LEFT mydriasis which she claims is ACUTE CT angio head/neck ordered to evaluate for PCOM aneurysm Reports distant h/o stroke but denies known deficits Denies h/o Myasthenia Gravis/MS Will proceed with imaging 3:30 AM Pt found to have PCOM aneurysm She still reports HA No other acute complaints No  focal arm/leg weakness Denies CP at this time  I have spoken to Dr Vertell Limber with neurosurgery 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   Final Clinical Impressions(s) / ED Diagnoses   Final diagnoses:  Headache  Posterior communicating artery aneurysm  Acute intractable tension-type headache   I personally performed the services described in this documentation, which was scribed in my presence. The recorded information has been reviewed and is accurate.     New Prescriptions New Prescriptions   No medications on file     Ripley Fraise, MD 11/18/16 [phone]

## 2016-11-18 ENCOUNTER — Encounter (HOSPITAL_COMMUNITY): Payer: Self-pay | Admitting: Radiology

## 2016-11-18 ENCOUNTER — Emergency Department (HOSPITAL_COMMUNITY): Payer: Medicare Other

## 2016-11-18 DIAGNOSIS — G44201 Tension-type headache, unspecified, intractable: Secondary | ICD-10-CM | POA: Diagnosis present

## 2016-11-18 DIAGNOSIS — D62 Acute posthemorrhagic anemia: Secondary | ICD-10-CM | POA: Diagnosis not present

## 2016-11-18 DIAGNOSIS — D72829 Elevated white blood cell count, unspecified: Secondary | ICD-10-CM | POA: Diagnosis not present

## 2016-11-18 DIAGNOSIS — I69398 Other sequelae of cerebral infarction: Secondary | ICD-10-CM | POA: Diagnosis not present

## 2016-11-18 DIAGNOSIS — E785 Hyperlipidemia, unspecified: Secondary | ICD-10-CM | POA: Diagnosis present

## 2016-11-18 DIAGNOSIS — Z888 Allergy status to other drugs, medicaments and biological substances status: Secondary | ICD-10-CM | POA: Diagnosis not present

## 2016-11-18 DIAGNOSIS — G441 Vascular headache, not elsewhere classified: Secondary | ICD-10-CM | POA: Diagnosis not present

## 2016-11-18 DIAGNOSIS — R269 Unspecified abnormalities of gait and mobility: Secondary | ICD-10-CM | POA: Diagnosis not present

## 2016-11-18 DIAGNOSIS — Z8673 Personal history of transient ischemic attack (TIA), and cerebral infarction without residual deficits: Secondary | ICD-10-CM | POA: Diagnosis not present

## 2016-11-18 DIAGNOSIS — Z96622 Presence of left artificial elbow joint: Secondary | ICD-10-CM | POA: Diagnosis present

## 2016-11-18 DIAGNOSIS — I714 Abdominal aortic aneurysm, without rupture: Secondary | ICD-10-CM | POA: Diagnosis present

## 2016-11-18 DIAGNOSIS — I252 Old myocardial infarction: Secondary | ICD-10-CM | POA: Diagnosis not present

## 2016-11-18 DIAGNOSIS — Z298 Encounter for other specified prophylactic measures: Secondary | ICD-10-CM | POA: Diagnosis not present

## 2016-11-18 DIAGNOSIS — M797 Fibromyalgia: Secondary | ICD-10-CM | POA: Diagnosis not present

## 2016-11-18 DIAGNOSIS — F17210 Nicotine dependence, cigarettes, uncomplicated: Secondary | ICD-10-CM | POA: Diagnosis present

## 2016-11-18 DIAGNOSIS — Z8249 Family history of ischemic heart disease and other diseases of the circulatory system: Secondary | ICD-10-CM | POA: Diagnosis not present

## 2016-11-18 DIAGNOSIS — I69319 Unspecified symptoms and signs involving cognitive functions following cerebral infarction: Secondary | ICD-10-CM | POA: Diagnosis not present

## 2016-11-18 DIAGNOSIS — R51 Headache: Secondary | ICD-10-CM | POA: Diagnosis not present

## 2016-11-18 DIAGNOSIS — I671 Cerebral aneurysm, nonruptured: Secondary | ICD-10-CM | POA: Diagnosis present

## 2016-11-18 DIAGNOSIS — R930 Abnormal findings on diagnostic imaging of skull and head, not elsewhere classified: Secondary | ICD-10-CM | POA: Diagnosis not present

## 2016-11-18 DIAGNOSIS — Z7982 Long term (current) use of aspirin: Secondary | ICD-10-CM | POA: Diagnosis not present

## 2016-11-18 DIAGNOSIS — I609 Nontraumatic subarachnoid hemorrhage, unspecified: Secondary | ICD-10-CM | POA: Diagnosis not present

## 2016-11-18 DIAGNOSIS — F329 Major depressive disorder, single episode, unspecified: Secondary | ICD-10-CM | POA: Diagnosis present

## 2016-11-18 DIAGNOSIS — I739 Peripheral vascular disease, unspecified: Secondary | ICD-10-CM | POA: Diagnosis present

## 2016-11-18 DIAGNOSIS — G894 Chronic pain syndrome: Secondary | ICD-10-CM | POA: Diagnosis present

## 2016-11-18 DIAGNOSIS — F419 Anxiety disorder, unspecified: Secondary | ICD-10-CM | POA: Diagnosis present

## 2016-11-18 DIAGNOSIS — I10 Essential (primary) hypertension: Secondary | ICD-10-CM | POA: Diagnosis not present

## 2016-11-18 DIAGNOSIS — H4902 Third [oculomotor] nerve palsy, left eye: Secondary | ICD-10-CM | POA: Diagnosis not present

## 2016-11-18 DIAGNOSIS — Z79899 Other long term (current) drug therapy: Secondary | ICD-10-CM | POA: Diagnosis not present

## 2016-11-18 DIAGNOSIS — I729 Aneurysm of unspecified site: Secondary | ICD-10-CM | POA: Diagnosis not present

## 2016-11-18 DIAGNOSIS — Z811 Family history of alcohol abuse and dependence: Secondary | ICD-10-CM | POA: Diagnosis not present

## 2016-11-18 DIAGNOSIS — G40909 Epilepsy, unspecified, not intractable, without status epilepticus: Secondary | ICD-10-CM | POA: Diagnosis not present

## 2016-11-18 DIAGNOSIS — F1010 Alcohol abuse, uncomplicated: Secondary | ICD-10-CM | POA: Diagnosis present

## 2016-11-18 LAB — CBC WITH DIFFERENTIAL/PLATELET
BASOS PCT: 0 %
Basophils Absolute: 0 10*3/uL (ref 0.0–0.1)
Eosinophils Absolute: 0 10*3/uL (ref 0.0–0.7)
Eosinophils Relative: 0 %
HEMATOCRIT: 38.8 % (ref 36.0–46.0)
Hemoglobin: 13.5 g/dL (ref 12.0–15.0)
LYMPHS PCT: 13 %
Lymphs Abs: 1.3 10*3/uL (ref 0.7–4.0)
MCH: 32.1 pg (ref 26.0–34.0)
MCHC: 34.8 g/dL (ref 30.0–36.0)
MCV: 92.2 fL (ref 78.0–100.0)
MONO ABS: 0.4 10*3/uL (ref 0.1–1.0)
MONOS PCT: 4 %
NEUTROS ABS: 8.3 10*3/uL — AB (ref 1.7–7.7)
Neutrophils Relative %: 83 %
Platelets: 213 10*3/uL (ref 150–400)
RBC: 4.21 MIL/uL (ref 3.87–5.11)
RDW: 13.7 % (ref 11.5–15.5)
WBC: 10 10*3/uL (ref 4.0–10.5)

## 2016-11-18 LAB — BASIC METABOLIC PANEL
ANION GAP: 8 (ref 5–15)
BUN: 19 mg/dL (ref 6–20)
CALCIUM: 10 mg/dL (ref 8.9–10.3)
CO2: 28 mmol/L (ref 22–32)
Chloride: 104 mmol/L (ref 101–111)
Creatinine, Ser: 1.04 mg/dL — ABNORMAL HIGH (ref 0.44–1.00)
GFR calc Af Amer: >60 mL/min (ref >60)
GFR calc non Af Amer: 54 mL/min — ABNORMAL LOW (ref >60)
GLUCOSE: 143 mg/dL — AB (ref 65–99)
Potassium: 3.1 mmol/L — ABNORMAL LOW (ref 3.5–5.1)
Sodium: 140 mmol/L (ref 135–145)

## 2016-11-18 LAB — ABO/RH: ABO/RH(D): O POS

## 2016-11-18 LAB — PREPARE RBC (CROSSMATCH)

## 2016-11-18 LAB — PROTIME-INR
INR: 0.96
Prothrombin Time: 12.7 seconds (ref 11.4–15.2)

## 2016-11-18 LAB — MRSA PCR SCREENING: MRSA by PCR: NEGATIVE

## 2016-11-18 LAB — ETHANOL: Alcohol, Ethyl (B): <5 mg/dL (ref <5)

## 2016-11-18 LAB — APTT: aPTT: 24 seconds (ref 24–36)

## 2016-11-18 MED ORDER — POTASSIUM CHLORIDE CRYS ER 20 MEQ PO TBCR
20.0000 meq | EXTENDED_RELEASE_TABLET | Freq: Two times a day (BID) | ORAL | Status: DC
  Administered 2016-11-18 – 2016-11-22 (×7): 20 meq via ORAL
  Filled 2016-11-18 (×8): qty 1

## 2016-11-18 MED ORDER — FENTANYL CITRATE (PF) 100 MCG/2ML IJ SOLN
50.0000 ug | Freq: Once | INTRAMUSCULAR | Status: DC
Start: 2016-11-18 — End: 2016-11-18

## 2016-11-18 MED ORDER — ACETAMINOPHEN 325 MG PO TABS
650.0000 mg | ORAL_TABLET | ORAL | Status: DC | PRN
  Administered 2016-11-20: 650 mg via ORAL
  Filled 2016-11-18: qty 2

## 2016-11-18 MED ORDER — DOCUSATE SODIUM 100 MG PO CAPS: 100.0000 mg | ORAL_CAPSULE | Freq: Every day | ORAL | Status: DC | PRN

## 2016-11-18 MED ORDER — LISINOPRIL-HYDROCHLOROTHIAZIDE 20-25 MG PO TABS: 1.0000 | ORAL_TABLET | Freq: Every day | ORAL | Status: DC

## 2016-11-18 MED ORDER — MORPHINE SULFATE (PF) 4 MG/ML IV SOLN
INTRAVENOUS | Status: AC
  Administered 2016-11-18: 4 mg via INTRAVENOUS
  Filled 2016-11-18: qty 1

## 2016-11-18 MED ORDER — MORPHINE SULFATE (PF) 2 MG/ML IV SOLN
1.0000 mg | INTRAVENOUS | Status: DC | PRN
  Administered 2016-11-18 (×4): 2 mg via INTRAVENOUS
  Administered 2016-11-18: 1 mg via INTRAVENOUS
  Administered 2016-11-18 – 2016-11-20 (×3): 2 mg via INTRAVENOUS
  Filled 2016-11-18 (×8): qty 1

## 2016-11-18 MED ORDER — MORPHINE SULFATE (PF) 4 MG/ML IV SOLN
4.0000 mg | Freq: Once | INTRAVENOUS | Status: AC
  Administered 2016-11-18: 4 mg via INTRAVENOUS
  Filled 2016-11-18: qty 1

## 2016-11-18 MED ORDER — METOPROLOL TARTRATE 25 MG PO TABS
25.0000 mg | ORAL_TABLET | Freq: Two times a day (BID) | ORAL | Status: DC
  Administered 2016-11-18 – 2016-11-22 (×8): 25 mg via ORAL
  Filled 2016-11-18 (×8): qty 1

## 2016-11-18 MED ORDER — SODIUM CHLORIDE 0.9 % IV SOLN
INTRAVENOUS | Status: DC
  Administered 2016-11-18: 07:00:00 via INTRAVENOUS

## 2016-11-18 MED ORDER — DEXAMETHASONE SODIUM PHOSPHATE 4 MG/ML IJ SOLN
4.0000 mg | Freq: Four times a day (QID) | INTRAMUSCULAR | Status: DC
  Administered 2016-11-18 – 2016-11-19 (×4): 4 mg via INTRAVENOUS
  Filled 2016-11-18 (×4): qty 1

## 2016-11-18 MED ORDER — TRAZODONE HCL 50 MG PO TABS
150.0000 mg | ORAL_TABLET | Freq: Every day | ORAL | Status: DC
  Administered 2016-11-18 – 2016-11-21 (×4): 150 mg via ORAL
  Filled 2016-11-18 (×4): qty 1

## 2016-11-18 MED ORDER — MORPHINE SULFATE (PF) 4 MG/ML IV SOLN
4.0000 mg | INTRAVENOUS | Status: DC | PRN
  Administered 2016-11-18: 4 mg via INTRAVENOUS

## 2016-11-18 MED ORDER — ACETAMINOPHEN 650 MG RE SUPP: 650.0000 mg | RECTAL | Status: DC | PRN

## 2016-11-18 MED ORDER — ASPIRIN EC 81 MG PO TBEC
81.0000 mg | DELAYED_RELEASE_TABLET | Freq: Every day | ORAL | Status: DC
  Administered 2016-11-18 – 2016-11-22 (×4): 81 mg via ORAL
  Filled 2016-11-18 (×4): qty 1

## 2016-11-18 MED ORDER — LAMOTRIGINE 100 MG PO TABS
100.0000 mg | ORAL_TABLET | Freq: Two times a day (BID) | ORAL | Status: DC
  Administered 2016-11-18 – 2016-11-22 (×8): 100 mg via ORAL
  Filled 2016-11-18 (×8): qty 1

## 2016-11-18 MED ORDER — STROKE: EARLY STAGES OF RECOVERY BOOK
Freq: Once | Status: DC
  Filled 2016-11-18: qty 1

## 2016-11-18 MED ORDER — LABETALOL HCL 5 MG/ML IV SOLN: 20.0000 mg | Freq: Once | INTRAVENOUS | Status: DC

## 2016-11-18 MED ORDER — ESCITALOPRAM OXALATE 10 MG PO TABS
20.0000 mg | ORAL_TABLET | Freq: Two times a day (BID) | ORAL | Status: DC
  Administered 2016-11-18 – 2016-11-22 (×8): 20 mg via ORAL
  Filled 2016-11-18 (×8): qty 2

## 2016-11-18 MED ORDER — HYDROCHLOROTHIAZIDE 25 MG PO TABS
25.0000 mg | ORAL_TABLET | Freq: Every day | ORAL | Status: DC
  Administered 2016-11-18 – 2016-11-22 (×4): 25 mg via ORAL
  Filled 2016-11-18 (×4): qty 1

## 2016-11-18 MED ORDER — DEXAMETHASONE SODIUM PHOSPHATE 10 MG/ML IJ SOLN
10.0000 mg | Freq: Once | INTRAMUSCULAR | Status: AC
  Administered 2016-11-18: 10 mg via INTRAVENOUS
  Filled 2016-11-18: qty 1

## 2016-11-18 MED ORDER — ACETAMINOPHEN-CODEINE #3 300-30 MG PO TABS
1.0000 | ORAL_TABLET | ORAL | Status: DC | PRN
  Administered 2016-11-21: 1 via ORAL
  Administered 2016-11-21 – 2016-11-22 (×2): 2 via ORAL
  Filled 2016-11-18: qty 1
  Filled 2016-11-18 (×2): qty 2

## 2016-11-18 MED ORDER — PANTOPRAZOLE SODIUM 40 MG IV SOLR
40.0000 mg | Freq: Every day | INTRAVENOUS | Status: DC
  Administered 2016-11-18 – 2016-11-19 (×2): 40 mg via INTRAVENOUS
  Filled 2016-11-18 (×2): qty 40

## 2016-11-18 MED ORDER — SENNOSIDES-DOCUSATE SODIUM 8.6-50 MG PO TABS
1.0000 | ORAL_TABLET | Freq: Two times a day (BID) | ORAL | Status: DC
  Administered 2016-11-18: 1 via ORAL
  Filled 2016-11-18 (×2): qty 1

## 2016-11-18 MED ORDER — ACETAMINOPHEN 160 MG/5ML PO SOLN: 650.0000 mg | ORAL | Status: DC | PRN

## 2016-11-18 MED ORDER — NICARDIPINE HCL IN NACL 20-0.86 MG/200ML-% IV SOLN: 0.0000 mg/h | INTRAVENOUS | Status: DC

## 2016-11-18 MED ORDER — LISINOPRIL 20 MG PO TABS
20.0000 mg | ORAL_TABLET | Freq: Every day | ORAL | Status: DC
  Administered 2016-11-18 – 2016-11-22 (×4): 20 mg via ORAL
  Filled 2016-11-18 (×4): qty 1

## 2016-11-18 MED ORDER — IOPAMIDOL (ISOVUE-370) INJECTION 76%
100.0000 mL | Freq: Once | INTRAVENOUS | Status: AC | PRN
  Administered 2016-11-18: 100 mL via INTRAVENOUS

## 2016-11-18 NOTE — ED Notes (Signed)
Pt was able to swallow water without any problems.

## 2016-11-18 NOTE — Procedures (Signed)
Arterial Catheter Insertion Procedure Note PEGGYANN ZWIEFELHOFER [id number] 16-Feb-1946  Procedure: Insertion of Arterial Catheter  Indications: Blood pressure monitoring  Procedure Details Consent: Risks of procedure as well as the alternatives and risks of each were explained to the (patient/caregiver).  Consent for procedure obtained. Time Out: Verified patient identification, verified procedure, site/side was marked, verified correct patient position, special equipment/implants available, medications/allergies/relevent history reviewed, required imaging and test results available.  Performed  Maximum sterile technique was used including antiseptics. Skin prep: Chlorhexidine; local anesthetic administered 20 gauge catheter was inserted into left radial artery using the Seldinger technique.  Evaluation Blood flow good; BP tracing good. Complications: No apparent complications.   Ronny Flurry 11/18/2016

## 2016-11-18 NOTE — Progress Notes (Signed)
No 10 meq in 100 ml bags potassium available. Equivalent dose po to be given now then BID per pharmacy standing order substitution.

## 2016-11-18 NOTE — Progress Notes (Signed)
PT Cancellation Note  Patient Details Name: Jennifer Jacobs MRN: [id number] DOB: 1946/08/09   Cancelled Treatment:    Reason Eval/Treat Not Completed: PT screened, no needs identified, will sign off. Per nursing no mobility deficits. Pt for surgery tomorrow. Please re-order PT post-op if needed. Thank you.   Jennifer Jacobs 11/18/2016, 10:30 AM  Jennifer Jacobs PT [phone]

## 2016-11-18 NOTE — Anesthesia Preprocedure Evaluation (Addendum)
Anesthesia Evaluation  Patient identified by MRN, date of birth, ID band Patient awake    Reviewed: Allergy & Precautions, NPO status , Patient's Chart, lab work & pertinent test results, reviewed documented beta blocker date and time   Airway Mallampati: II  TM Distance: >3 FB Neck ROM: Full    Dental  (+) Poor Dentition, Partial Upper, Chipped, Missing, Loose, Dental Advisory Given,    Pulmonary Current Smoker,    breath sounds clear to auscultation       Cardiovascular hypertension, Pt. on home beta blockers + Past MI (per pt., not substantiated by history and chary review) and + Peripheral Vascular Disease   Rhythm:Regular Rate:Normal     Neuro/Psych Seizures -,  PSYCHIATRIC DISORDERS  Neuromuscular disease (Aneurysm)    GI/Hepatic   Endo/Other    Renal/GU      Musculoskeletal  (+) Arthritis , Fibromyalgia -  Abdominal   Peds  Hematology   Anesthesia Other Findings   Reproductive/Obstetrics                           Anesthesia Physical Anesthesia Plan  ASA: III  Anesthesia Plan: General   Post-op Pain Management:    Induction: Intravenous  Airway Management Planned: Oral ETT  Additional Equipment: Arterial line  Intra-op Plan:   Post-operative Plan: Extubation in OR  Informed Consent: I have reviewed the patients History and Physical, chart, labs and discussed the procedure including the risks, benefits and alternatives for the proposed anesthesia with the patient or authorized representative who has indicated his/her understanding and acceptance.   Dental advisory given  Plan Discussed with: CRNA  Anesthesia Plan Comments: (EKG reviewed, appears unchanged since 2013. Per Dr Vertell Limber Notes:  Exercise tolerance test  04/27/2013  CPET-MET: Submaximal effort (0.96, with goal of greater than 1.09) -- patient states that her effort was limited due to knee pain.  This makes the  rest of the interpretation difficult: Peak VO2 - 10.5 (59% moderate), peak 02-pulse -  7..11 (low); heart rate 114 (73% -chronic incompetence considered);; PFTs normal  . MOUTH SURGERY   . NM MYOVIEW LTD  05/19/2013  LexiScan: EF 80%, no evidence of ischemia or infarction . TRANSTHORACIC ECHOCARDIOGRAM  03/25/2013  EF 55-60%, no regional wall motion abnormalities; normal diastolic parameters, normal pulmonary pressures.  No valvular lesions noted.  Essentially normal echo   )       Anesthesia Quick Evaluation

## 2016-11-18 NOTE — ED Notes (Signed)
Pt began complaining of chest pain when this tech entered her room. EDP was made aware and EKG was done and given to EDP.

## 2016-11-18 NOTE — Progress Notes (Signed)
Pt seen and examined. No issues overnight.  EXAM: Temp:  [97.7 F (36.5 C)-98.7 F (37.1 C)] 98.5 F (36.9 C) (04/01 0813) Pulse Rate:  [61-83] 70 (04/01 0600) Resp:  [12-22] 12 (04/01 0600) BP: (133-135)/(79-84) 135/84 (04/01 0600) SpO2:  [92 %-100 %] 96 % (04/01 0600) Weight:  [81.6 kg (180 lb)] 81.6 kg (180 lb) (03/31 2308) Intake/Output    None    Awake and alert Complete left third nerve palsy Follows commands throughout Full strength  I was asked by Dr. Venetia Maxon to assume care. I have reviewed her imaging and think she will be best served by a craniotomy for clipping of both left sided aneurysms, this will allow [place] to decompress the left third nerve; endovascular treatment would not allow that.  I have had a long discussion with the patient and her son in which we discussed the risks, benefits, and alternatives to surgery.  They express understanding and wish to proceed.  Surgery scheduled for 0730 tomorrow morning.

## 2016-11-18 NOTE — Evaluation (Signed)
Speech Language Pathology Evaluation Patient Details Name: Jennifer Jacobs MRN: [id number] DOB: October 14, 1945 Today's Date: 11/18/2016 Time: 5638-9373 SLP Time Calculation (min) (ACUTE ONLY): 14 min  Problem List:  Patient Active Problem List   Diagnosis Date Noted  . Aneurysm ([place]) 11/18/2016  . 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  . Bilateral leg pain 06/26/2016  . AAA (abdominal aortic aneurysm) without rupture ([place]) 06/26/2016  . Insomnia 06/07/2015  . Urinary incontinence 06/07/2015  . Chronic pain syndrome 03/04/2015  . Fibromyalgia 03/04/2015  . Neck pain 03/04/2015  . Cervical radiculitis 03/04/2015  . Lower back pain 03/04/2015  . Lumbar radicular pain 03/04/2015  . Major depression 07/05/2014  . Alcohol abuse 07/05/2014  . Liver cyst 03/18/2014  . Claudication, class I ([place]) 03/18/2013  . Hypertension   . Smoker unmotivated to quit    Past Medical History:  Past Medical History:  Diagnosis Date  . Allergy   . Anxiety   . Anxiety and depression   . Arthritis   . Depression   . Dyslipidemia   . Fibromyalgia   . Hepatic cyst   . Hyperlipidemia   . Hypertension   . MI (myocardial infarction) 2007   By patient report, not substantiated by recent nuclear stress test.  . Neuromuscular disorder ([place])   . Seizure disorder ([place])    none since 2002  . Seizures ([place])   . Smoker unmotivated to quit    Quit for 3 years and then restarted  . Substance abuse    ETOH  . Vision abnormalities    Past Surgical History:  Past Surgical History:  Procedure Laterality Date  . ELBOW ARTHROPLASTY Left    rods  . Exercise tolerance test  04/27/2013   CPET-MET: Submaximal effort (0.96, with goal of greater than 1.09) -- patient states that her effort was limited due to knee pain.  This makes the rest of the interpretation difficult: Peak VO2 - 10.5 (59% moderate), peak 02-pulse -  7..11 (low); heart rate 114 (73% -chronic incompetence considered);; PFTs normal    . MOUTH SURGERY    . NM MYOVIEW LTD  05/19/2013   LexiScan: EF 80%, no evidence of ischemia or infarction  . TRANSTHORACIC ECHOCARDIOGRAM  03/25/2013   EF 55-60%, no regional wall motion abnormalities; normal diastolic parameters, normal pulmonary pressures.  No valvular lesions noted.  Essentially normal echo    HPI:  71 y.o.[facility], who has a PMhx of HLD, HTN, MI, ETOH abuse presentedto the Emergency Department complaining of an acute onset, progressively worsening, generalized headache.  CCT - left posterior communicating artery aneurysm and also a left MCA artery aneurysm. Neurosurgery following.  Assessment / Plan / Recommendation Clinical Impression  Pt presents with fluent speech.  No dysarthria.  Intact expressive and receptive language abilities.  Good historian.  No SLP intervention required at this time. Please refer again if there is deterioration in speech/language s/p surgery.     SLP Assessment  SLP Recommendation/Assessment: Patient does not need any further Speech Lanaguage Pathology Services SLP Visit Diagnosis: Cognitive communication deficit (R41.841)    Follow Up Recommendations    no f/u   Frequency and Duration           SLP Evaluation Cognition  Overall Cognitive Status: Within Functional Limits for tasks assessed Arousal/Alertness: Awake/alert Orientation Level: Oriented X4       Comprehension  Auditory Comprehension Overall Auditory Comprehension: Appears within functional limits for tasks assessed Yes/No Questions: Within Functional Limits Commands:  Within Functional Limits Visual Recognition/Discrimination Discrimination: Within Function Limits Reading Comprehension Reading Status: Within funtional limits    Expression Expression Primary Mode of Expression: Verbal Verbal Expression Overall Verbal Expression: Appears within functional limits for tasks assessed   Oral / Motor  Oral Motor/Sensory Function Overall  Oral Motor/Sensory Function: Within functional limits (CN III left) Motor Speech Overall Motor Speech: Appears within functional limits for tasks assessed   GO                    Jennifer Jacobs [name] 11/18/2016, 9:32 AM

## 2016-11-18 NOTE — ED Notes (Signed)
Report given to [facility]

## 2016-11-18 NOTE — H&P (Signed)
Reason for Admission:New onset headache, third nerve palsy and left posterior communicating artery aneurysm Referring Physician: Neera Jacobs is an 71 y.o. female.  HPI: Jennifer Jacobs is a 71 y.o. female transferred from [facility] to [facility], who has a PMhx of HLD, HTN, MI, ETOH abuse presented to the Emergency Department complaining of an acute onset, progressively worsening, generalized headache that began yesterday morning, but intensified through the evening. Pt says her headache started after she put out some moth balls. She reports associated blurry vision, photophobia, weakness in BUE/BLE, and mild chest pain. She did not lose consciousness. Pt has not had a similar headache in the past. She denies fever and vomiting.   Patient is unable to open left eye.  She says light bothers her eyes.   Past Medical History:  Diagnosis Date  . Allergy   . Anxiety   . Anxiety and depression   . Arthritis   . Depression   . Dyslipidemia   . Fibromyalgia   . Hepatic cyst   . Hyperlipidemia   . Hypertension   . MI (myocardial infarction) 2007   By patient report, not substantiated by recent nuclear stress test.  . Neuromuscular disorder ([place])   . Seizure disorder ([place])    none since 2002  . Seizures ([place])   . Smoker unmotivated to quit    Quit for 3 years and then restarted  . Substance abuse    ETOH  . Vision abnormalities     Past Surgical History:  Procedure Laterality Date  . ELBOW ARTHROPLASTY Left    rods  . Exercise tolerance test  04/27/2013   CPET-MET: Submaximal effort (0.96, with goal of greater than 1.09) -- patient states that her effort was limited due to knee pain.  This makes the rest of the interpretation difficult: Peak VO2 - 10.5 (59% moderate), peak 02-pulse -  7..11 (low); heart rate 114 (73% -chronic incompetence considered);; PFTs normal   . MOUTH SURGERY    . NM MYOVIEW LTD  05/19/2013   LexiScan: EF 80%, no evidence of ischemia or infarction  .  TRANSTHORACIC ECHOCARDIOGRAM  03/25/2013   EF 55-60%, no regional wall motion abnormalities; normal diastolic parameters, normal pulmonary pressures.  No valvular lesions noted.  Essentially normal echo     Family History  Problem Relation Age of Onset  . Heart attack Father 55  . Hypertension Father   . Alcohol abuse Father   . Heart attack Maternal Grandmother   . Cancer Maternal Grandfather     type unknown  . Cancer Paternal Grandmother     type unknown  . Depression Mother   . Alcohol abuse Mother   . Ovarian cancer Paternal Aunt   . Cancer Paternal Aunt     ovarian  . Prostate cancer Paternal Uncle   . Stomach cancer Paternal Aunt   . Diabetes Daughter   . Heart disease Daughter 34    heart attack  . Depression Sister   . Alcohol abuse Brother   . Depression Sister   . Alcohol abuse Sister   . Depression Sister   . Alcohol abuse Sister   . Alcohol abuse Brother   . Alcohol abuse Son     Social History:  reports that she has been smoking Cigarettes.  She has been smoking about 0.10 packs per day. She has never used smokeless tobacco. She reports that she uses drugs, including Marijuana, about 2 times per week. She reports that she does not  drink alcohol.  Allergies:  Allergies  Allergen Reactions  . Prozac [Fluoxetine Hcl] Other (See Comments)    Headaches    Medications: I have reviewed the patient's current medications.  Results for orders placed or performed during the hospital encounter of 11/17/16 (from the past 48 hour(s))  Basic metabolic panel     Status: Abnormal   Collection Time: 11/18/16  1:34 AM  Result Value Ref Range   Sodium 140 135 - 145 mmol/L   Potassium 3.1 (L) 3.5 - 5.1 mmol/L   Chloride 104 101 - 111 mmol/L   CO2 28 22 - 32 mmol/L   Glucose, Bld 143 (H) 65 - 99 mg/dL   BUN 19 6 - 20 mg/dL   Creatinine, Ser 1.04 (H) 0.44 - 1.00 mg/dL   Calcium 10.0 8.9 - 10.3 mg/dL   GFR calc non Af Amer 54 (L) >60 mL/min   GFR calc Af Amer >60 >60  mL/min    Comment: (NOTE) The eGFR has been calculated using the CKD EPI equation. This calculation has not been validated in all clinical situations. eGFR's persistently <60 mL/min signify possible Chronic Kidney Disease.    Anion gap 8 5 - 15  CBC WITH DIFFERENTIAL     Status: Abnormal   Collection Time: 11/18/16  1:34 AM  Result Value Ref Range   WBC 10.0 4.0 - 10.5 K/uL   RBC 4.21 3.87 - 5.11 MIL/uL   Hemoglobin 13.5 12.0 - 15.0 g/dL   HCT 38.8 36.0 - 46.0 %   MCV 92.2 78.0 - 100.0 fL   MCH 32.1 26.0 - 34.0 pg   MCHC 34.8 30.0 - 36.0 g/dL   RDW 13.7 11.5 - 15.5 %   Platelets 213 150 - 400 K/uL   Neutrophils Relative % 83 %   Neutro Abs 8.3 (H) 1.7 - 7.7 K/uL   Lymphocytes Relative 13 %   Lymphs Abs 1.3 0.7 - 4.0 K/uL   Monocytes Relative 4 %   Monocytes Absolute 0.4 0.1 - 1.0 K/uL   Eosinophils Relative 0 %   Eosinophils Absolute 0.0 0.0 - 0.7 K/uL   Basophils Relative 0 %   Basophils Absolute 0.0 0.0 - 0.1 K/uL  Protime-INR     Status: None   Collection Time: 11/18/16  1:34 AM  Result Value Ref Range   Prothrombin Time 12.7 11.4 - 15.2 seconds   INR 0.96   Ethanol     Status: None   Collection Time: 11/18/16  1:34 AM  Result Value Ref Range   Alcohol, Ethyl (B) <5 <5 mg/dL    Comment:        LOWEST DETECTABLE LIMIT FOR SERUM ALCOHOL IS 5 mg/dL FOR MEDICAL PURPOSES ONLY   APTT     Status: None   Collection Time: 11/18/16  1:34 AM  Result Value Ref Range   aPTT 24 24 - 36 seconds    Ct Angio Head W Or Wo Contrast  Result Date: 11/18/2016 CLINICAL DATA:  Headache after handling mothballs, LEFT pupil dilatation. History of seizures, hypertension, neuromuscular disorder, substance abuse. EXAM: CT ANGIOGRAPHY HEAD AND NECK TECHNIQUE: Multidetector CT imaging of the head and neck was performed using the standard protocol during bolus administration of intravenous contrast. Multiplanar CT image reconstructions and MIPs were obtained to evaluate the vascular anatomy.  Carotid stenosis measurements (when applicable) are obtained utilizing NASCET criteria, using the distal internal carotid diameter as the denominator. CONTRAST:  100 cc Isovue 370 COMPARISON:  CT HEAD November 18, 2016 at 0000 hours an CT cervical spine February 06, 2016 FINDINGS: CTA NECK AORTIC ARCH: Ascending aorta is at least 4 cm in transaxial dimension, incompletely assessed. Mild bulbous conformity of the LEFT descending aorta, incompletely evaluated. The origins of the innominate, left Common carotid artery and subclavian artery are widely patent. RIGHT CAROTID SYSTEM: Common carotid artery is widely patent, coursing in a straight line fashion. Normal appearance of the carotid bifurcation without hemodynamically significant stenosis by NASCET criteria, trace calcific atherosclerosis. Normal appearance of the included internal carotid artery. LEFT CAROTID SYSTEM: Common carotid artery is widely patent, coursing in a straight line fashion. Normal appearance of the carotid bifurcation without hemodynamically significant stenosis by NASCET criteria. Normal appearance of the included internal carotid artery. VERTEBRAL ARTERIES:Codominant vertebral artery's. Normal appearance of the vertebral arteries, which appear widely patent. SKELETON: No acute osseous process though bone windows have not been submitted. Severe C5-6 and C6-7 degenerative discs, moderate to severe at C4-5. Grade 1 C3-4 and C4-5 anterolisthesis. Poor dentition with multiple periapical lucency/abscess. OTHER NECK: Soft tissues of the neck are non-acute though, not tailored for evaluation. 11 mm RIGHT thyroid nodule below size followup recommendation. CTA HEAD ANTERIOR CIRCULATION: Patent cervical internal carotid arteries, petrous, cavernous and supra clinoid internal carotid arteries. Mildly patulous bilateral cavernous segments. 4 mm wide neck, patent superiorly directed intact aneurysm. Mild luminal irregularity of the anterior and middle cerebral  arteries. No large vessel occlusion, hemodynamically significant stenosis, dissection, contrast extravasation. POSTERIOR CIRCULATION: Normal appearance of the vertebral arteries, vertebrobasilar junction and basilar artery, as well as main branch vessels. Normal appearance of the posterior cerebral arteries. Small bilateral posterior communicating arteries present. 8 x 8 x 5 mm (transverse by AP by CC) bilobed posteriorly directed intact wide necked patent LEFT posterior communicating artery origin aneurysm. RIGHT posterior communicating artery origin infundibulum. Blister aneurysm RIGHT anterior genu of the internal carotid artery. Mild luminal irregularity of the posterior cerebral arteries. No large vessel occlusion, hemodynamically significant stenosis, dissection, contrast extravasation. VENOUS SINUSES: Major dural venous sinuses are patent though not tailored for evaluation on this angiographic examination. ANATOMIC VARIANTS: None. DELAYED PHASE: No abnormal intracranial enhancement. MIP images reviewed. IMPRESSION: CTA NECK: No hemodynamically significant stenosis or acute vascular process. **An incidental finding of potential clinical significance has been found. Recommend annual imaging followup by CTA or MRA. This recommendation follows 2010 ACCF/AHA/AATS/ACR/ASA/SCA/SCAI/SIR/STS/SVM Guidelines for the Diagnosis and Management of Patients with Thoracic Aortic Disease. Circulation. 2010; 121: [id number]** CTA HEAD: Mild atherosclerosis without hemodynamically significant stenosis or emergent large vessel occlusion. 5 mm intact LEFT MCA bifurcation aneurysm. 8 x 8 x 5 mm intact LEFT posterior communicating artery origin aneurysm. Recommend neurovascular consultation on a nonemergent basis. Electronically Signed   By: Elon Alas M.D.   On: 11/18/2016 02:55   Ct Head Wo Contrast  Result Date: 11/18/2016 CLINICAL DATA:  Headache for 1 day. EXAM: CT HEAD WITHOUT CONTRAST TECHNIQUE: Contiguous axial  images were obtained from the base of the skull through the vertex without intravenous contrast. COMPARISON:  Head CT 02/06/2016 FINDINGS: Brain: No evidence of acute infarction, hemorrhage, hydrocephalus, extra-axial collection or mass lesion/mass effect. Mild atrophy is normal for age. Vascular: Atherosclerosis of skullbase vasculature without hyperdense vessel or abnormal calcification. Skull: Normal. Negative for fracture or focal lesion. Sinuses/Orbits: Paranasal sinuses and mastoid air cells are clear. The visualized orbits are unremarkable. Other: None. IMPRESSION: No acute intracranial abnormality. Electronically Signed   By: Jeb Levering M.D.   On: 11/18/2016 00:45   Ct Angio  Neck W And/or Wo Contrast  Result Date: 11/18/2016 CLINICAL DATA:  Headache after handling mothballs, LEFT pupil dilatation. History of seizures, hypertension, neuromuscular disorder, substance abuse. EXAM: CT ANGIOGRAPHY HEAD AND NECK TECHNIQUE: Multidetector CT imaging of the head and neck was performed using the standard protocol during bolus administration of intravenous contrast. Multiplanar CT image reconstructions and MIPs were obtained to evaluate the vascular anatomy. Carotid stenosis measurements (when applicable) are obtained utilizing NASCET criteria, using the distal internal carotid diameter as the denominator. CONTRAST:  100 cc Isovue 370 COMPARISON:  CT HEAD November 18, 2016 at 0000 hours an CT cervical spine February 06, 2016 FINDINGS: CTA NECK AORTIC ARCH: Ascending aorta is at least 4 cm in transaxial dimension, incompletely assessed. Mild bulbous conformity of the LEFT descending aorta, incompletely evaluated. The origins of the innominate, left Common carotid artery and subclavian artery are widely patent. RIGHT CAROTID SYSTEM: Common carotid artery is widely patent, coursing in a straight line fashion. Normal appearance of the carotid bifurcation without hemodynamically significant stenosis by NASCET criteria,  trace calcific atherosclerosis. Normal appearance of the included internal carotid artery. LEFT CAROTID SYSTEM: Common carotid artery is widely patent, coursing in a straight line fashion. Normal appearance of the carotid bifurcation without hemodynamically significant stenosis by NASCET criteria. Normal appearance of the included internal carotid artery. VERTEBRAL ARTERIES:Codominant vertebral artery's. Normal appearance of the vertebral arteries, which appear widely patent. SKELETON: No acute osseous process though bone windows have not been submitted. Severe C5-6 and C6-7 degenerative discs, moderate to severe at C4-5. Grade 1 C3-4 and C4-5 anterolisthesis. Poor dentition with multiple periapical lucency/abscess. OTHER NECK: Soft tissues of the neck are non-acute though, not tailored for evaluation. 11 mm RIGHT thyroid nodule below size followup recommendation. CTA HEAD ANTERIOR CIRCULATION: Patent cervical internal carotid arteries, petrous, cavernous and supra clinoid internal carotid arteries. Mildly patulous bilateral cavernous segments. 4 mm wide neck, patent superiorly directed intact aneurysm. Mild luminal irregularity of the anterior and middle cerebral arteries. No large vessel occlusion, hemodynamically significant stenosis, dissection, contrast extravasation. POSTERIOR CIRCULATION: Normal appearance of the vertebral arteries, vertebrobasilar junction and basilar artery, as well as main branch vessels. Normal appearance of the posterior cerebral arteries. Small bilateral posterior communicating arteries present. 8 x 8 x 5 mm (transverse by AP by CC) bilobed posteriorly directed intact wide necked patent LEFT posterior communicating artery origin aneurysm. RIGHT posterior communicating artery origin infundibulum. Blister aneurysm RIGHT anterior genu of the internal carotid artery. Mild luminal irregularity of the posterior cerebral arteries. No large vessel occlusion, hemodynamically significant  stenosis, dissection, contrast extravasation. VENOUS SINUSES: Major dural venous sinuses are patent though not tailored for evaluation on this angiographic examination. ANATOMIC VARIANTS: None. DELAYED PHASE: No abnormal intracranial enhancement. MIP images reviewed. IMPRESSION: CTA NECK: No hemodynamically significant stenosis or acute vascular process. **An incidental finding of potential clinical significance has been found. Recommend annual imaging followup by CTA or MRA. This recommendation follows 2010 ACCF/AHA/AATS/ACR/ASA/SCA/SCAI/SIR/STS/SVM Guidelines for the Diagnosis and Management of Patients with Thoracic Aortic Disease. Circulation. 2010; 121: [id number]** CTA HEAD: Mild atherosclerosis without hemodynamically significant stenosis or emergent large vessel occlusion. 5 mm intact LEFT MCA bifurcation aneurysm. 8 x 8 x 5 mm intact LEFT posterior communicating artery origin aneurysm. Recommend neurovascular consultation on a nonemergent basis. Electronically Signed   By: Elon Alas M.D.   On: 11/18/2016 02:55    Review of Systems - Psychological ROS: positive for - anxiety and depression History of prior stroke, heart attack in past, substance abuse, smoker,  ETOH abuse, seizure   Blood pressure 135/84, pulse 70, temperature 97.8 F (36.6 C), temperature source Oral, resp. rate 12, height _0  (1.727 m), weight 180 lb (81.6 kg), SpO2 96 %. Physical Exam  Constitutional: She is oriented to person, place, and time. She appears well-developed and well-nourished.  HENT:  Head: Normocephalic and atraumatic.  Eyes: Conjunctivae and lids are normal. Right eye exhibits normal extraocular motion. Left eye exhibits abnormal extraocular motion. Left pupil is not reactive. Pupils are unequal.  Patient has ptosis of left eye and is unable to open eye.  She has a complete third nerve palsy on the left with non-reactive pupil and paresis of up, medial and down-gaze with preserve sixth nerve function  (lateral gaze).  Neck: Trachea normal and normal range of motion. Neck supple.  Cardiovascular: Normal rate and regular rhythm.   Respiratory: Effort normal and breath sounds normal.  GI: Soft. Normal appearance. There is no tenderness.  Neurological: She is alert and oriented to person, place, and time. She has normal strength and normal reflexes. A cranial nerve deficit is present. No sensory deficit. GCS eye subscore is 4. GCS verbal subscore is 5. GCS motor subscore is 6.  Psychiatric: She has a normal mood and affect. Her speech is normal and behavior is normal. Thought content normal.    Assessment/Plan: Patient has new onset headache and third nerve palsy on the left.  She has a left posterior communicating artery aneurysm and also a left MCA artery aneurysm.  She does not appear to have subarachnoid blood on CT scan.  She has a history of hypertension.  She has a symptomatic left P. Comm. Artery aneurysm, which should be treated.  Options include endovascular management versus craniotomy and clipping of aneurysm.  Peggyann Shoals, MD 11/18/2016, 6:24 AM

## 2016-11-18 NOTE — ED Notes (Signed)
Pt moved to room 11.

## 2016-11-19 ENCOUNTER — Encounter (HOSPITAL_COMMUNITY): Payer: Self-pay | Admitting: Certified Registered"

## 2016-11-19 ENCOUNTER — Inpatient Hospital Stay (HOSPITAL_COMMUNITY): Payer: Medicare Other | Admitting: *Deleted

## 2016-11-19 ENCOUNTER — Encounter (HOSPITAL_COMMUNITY)
Admission: EM | Disposition: A | Discharge status: Rehabilitation Facility | Payer: Self-pay | Source: Home / Self Care | Attending: Neurological Surgery

## 2016-11-19 HISTORY — PX: CRANIOTOMY: SHX93

## 2016-11-19 LAB — PREPARE RBC (CROSSMATCH)

## 2016-11-19 SURGERY — CRANIOTOMY INTRACRANIAL ANEURYSM FOR CAROTID
Anesthesia: General | Laterality: Left

## 2016-11-19 MED ORDER — SODIUM CHLORIDE 0.9 % IV SOLN
1000.0000 mg | INTRAVENOUS | Status: AC
  Administered 2016-11-19: 1000 mg via INTRAVENOUS
  Filled 2016-11-19: qty 10

## 2016-11-19 MED ORDER — ROCURONIUM BROMIDE 50 MG/5ML IV SOSY
PREFILLED_SYRINGE | INTRAVENOUS | Status: AC
  Filled 2016-11-19: qty 5

## 2016-11-19 MED ORDER — ONDANSETRON HCL 4 MG/2ML IJ SOLN: 4.0000 mg | Freq: Once | INTRAMUSCULAR | Status: DC | PRN

## 2016-11-19 MED ORDER — GELATIN ABSORBABLE MT POWD
OROMUCOSAL | Status: DC | PRN
  Administered 2016-11-19 (×2): via TOPICAL

## 2016-11-19 MED ORDER — LIDOCAINE-EPINEPHRINE 2 %-1:100000 IJ SOLN
INTRAMUSCULAR | Status: AC
  Filled 2016-11-19: qty 1

## 2016-11-19 MED ORDER — LIDOCAINE HCL (CARDIAC) 20 MG/ML IV SOLN
INTRAVENOUS | Status: DC | PRN
  Administered 2016-11-19: 100 mg via INTRAVENOUS

## 2016-11-19 MED ORDER — SUGAMMADEX SODIUM 200 MG/2ML IV SOLN
INTRAVENOUS | Status: AC
Start: 2016-11-19 — End: 2016-11-19
  Filled 2016-11-19: qty 2

## 2016-11-19 MED ORDER — LIDOCAINE 2% (20 MG/ML) 5 ML SYRINGE
INTRAMUSCULAR | Status: AC
  Filled 2016-11-19: qty 5

## 2016-11-19 MED ORDER — SODIUM CHLORIDE 0.9 % IV SOLN: Freq: Once | INTRAVENOUS | Status: DC

## 2016-11-19 MED ORDER — PHENYLEPHRINE 40 MCG/ML (10ML) SYRINGE FOR IV PUSH (FOR BLOOD PRESSURE SUPPORT)
PREFILLED_SYRINGE | INTRAVENOUS | Status: AC
  Filled 2016-11-19: qty 10

## 2016-11-19 MED ORDER — LIDOCAINE-EPINEPHRINE 2 %-1:100000 IJ SOLN
INTRAMUSCULAR | Status: DC | PRN
  Administered 2016-11-19: 30 mL via INTRADERMAL

## 2016-11-19 MED ORDER — DEXAMETHASONE SODIUM PHOSPHATE 4 MG/ML IJ SOLN
4.0000 mg | Freq: Four times a day (QID) | INTRAMUSCULAR | Status: AC
  Administered 2016-11-20 – 2016-11-21 (×4): 4 mg via INTRAVENOUS
  Filled 2016-11-19 (×4): qty 1

## 2016-11-19 MED ORDER — THROMBIN 20000 UNITS EX SOLR
CUTANEOUS | Status: DC | PRN
  Administered 2016-11-19: 08:00:00 via TOPICAL

## 2016-11-19 MED ORDER — ALBUMIN HUMAN 5 % IV SOLN
INTRAVENOUS | Status: DC | PRN
  Administered 2016-11-19: 11:00:00 via INTRAVENOUS

## 2016-11-19 MED ORDER — DEXAMETHASONE SODIUM PHOSPHATE 10 MG/ML IJ SOLN
INTRAMUSCULAR | Status: AC
  Filled 2016-11-19: qty 1

## 2016-11-19 MED ORDER — SODIUM CHLORIDE 0.9 % IV SOLN
INTRAVENOUS | Status: DC
  Administered 2016-11-20: 02:00:00 via INTRAVENOUS

## 2016-11-19 MED ORDER — ONDANSETRON HCL 4 MG/2ML IJ SOLN: 4.0000 mg | INTRAMUSCULAR | Status: DC | PRN

## 2016-11-19 MED ORDER — SUGAMMADEX SODIUM 200 MG/2ML IV SOLN
INTRAVENOUS | Status: DC | PRN
  Administered 2016-11-19: 160 mg via INTRAVENOUS

## 2016-11-19 MED ORDER — ONDANSETRON HCL 4 MG/2ML IJ SOLN
INTRAMUSCULAR | Status: AC
  Filled 2016-11-19: qty 2

## 2016-11-19 MED ORDER — PROMETHAZINE HCL 25 MG PO TABS: 12.5000 mg | ORAL_TABLET | ORAL | Status: DC | PRN

## 2016-11-19 MED ORDER — ESMOLOL HCL 100 MG/10ML IV SOLN
INTRAVENOUS | Status: AC
  Filled 2016-11-19: qty 10

## 2016-11-19 MED ORDER — NALOXONE HCL 0.4 MG/ML IJ SOLN: 0.0800 mg | INTRAMUSCULAR | Status: DC | PRN

## 2016-11-19 MED ORDER — ONDANSETRON HCL 4 MG PO TABS: 4.0000 mg | ORAL_TABLET | ORAL | Status: DC | PRN

## 2016-11-19 MED ORDER — 0.9 % SODIUM CHLORIDE (POUR BTL) OPTIME
TOPICAL | Status: DC | PRN
  Administered 2016-11-19 (×3): 1000 mL

## 2016-11-19 MED ORDER — FUROSEMIDE 10 MG/ML IJ SOLN
10.0000 mg | INTRAMUSCULAR | Status: AC
  Administered 2016-11-19: 10 mg via INTRAVENOUS
  Filled 2016-11-19: qty 4

## 2016-11-19 MED ORDER — FENTANYL CITRATE (PF) 100 MCG/2ML IJ SOLN
INTRAMUSCULAR | Status: DC | PRN
  Administered 2016-11-19: 150 ug via INTRAVENOUS
  Administered 2016-11-19: 100 ug via INTRAVENOUS
  Administered 2016-11-19: 50 ug via INTRAVENOUS
  Administered 2016-11-19: 100 ug via INTRAVENOUS

## 2016-11-19 MED ORDER — PANTOPRAZOLE SODIUM 40 MG IV SOLR: 40.0000 mg | Freq: Every day | INTRAVENOUS | Status: DC

## 2016-11-19 MED ORDER — PROPOFOL 10 MG/ML IV BOLUS
INTRAVENOUS | Status: AC
  Filled 2016-11-19: qty 20

## 2016-11-19 MED ORDER — ROCURONIUM BROMIDE 100 MG/10ML IV SOLN
INTRAVENOUS | Status: DC | PRN
  Administered 2016-11-19: 30 mg via INTRAVENOUS
  Administered 2016-11-19: 20 mg via INTRAVENOUS
  Administered 2016-11-19: 50 mg via INTRAVENOUS
  Administered 2016-11-19: 20 mg via INTRAVENOUS
  Administered 2016-11-19 (×2): 30 mg via INTRAVENOUS

## 2016-11-19 MED ORDER — DEXAMETHASONE SODIUM PHOSPHATE 10 MG/ML IJ SOLN
6.0000 mg | Freq: Four times a day (QID) | INTRAMUSCULAR | Status: AC
  Administered 2016-11-19 – 2016-11-20 (×4): 6 mg via INTRAVENOUS
  Filled 2016-11-19 (×3): qty 1

## 2016-11-19 MED ORDER — FENTANYL CITRATE (PF) 100 MCG/2ML IJ SOLN: 25.0000 ug | INTRAMUSCULAR | Status: DC | PRN

## 2016-11-19 MED ORDER — LABETALOL HCL 5 MG/ML IV SOLN
INTRAVENOUS | Status: DC | PRN
  Administered 2016-11-19 (×2): 5 mg via INTRAVENOUS

## 2016-11-19 MED ORDER — HYDROCODONE-ACETAMINOPHEN 5-325 MG PO TABS
1.0000 | ORAL_TABLET | ORAL | Status: DC | PRN
  Administered 2016-11-19 – 2016-11-21 (×7): 1 via ORAL
  Filled 2016-11-19 (×7): qty 1

## 2016-11-19 MED ORDER — BUPIVACAINE HCL (PF) 0.5 % IJ SOLN
INTRAMUSCULAR | Status: DC | PRN
  Administered 2016-11-19: 30 mL

## 2016-11-19 MED ORDER — SENNA 8.6 MG PO TABS
1.0000 | ORAL_TABLET | Freq: Two times a day (BID) | ORAL | Status: DC
  Administered 2016-11-19 – 2016-11-22 (×5): 8.6 mg via ORAL
  Filled 2016-11-19 (×6): qty 1

## 2016-11-19 MED ORDER — LABETALOL HCL 5 MG/ML IV SOLN
INTRAVENOUS | Status: AC
  Filled 2016-11-19: qty 4

## 2016-11-19 MED ORDER — MANNITOL 25 % IV SOLN
100.0000 g | INTRAVENOUS | Status: AC
  Administered 2016-11-19 (×8): 12.5 g via INTRAVENOUS
  Filled 2016-11-19 (×2): qty 400

## 2016-11-19 MED ORDER — BISACODYL 5 MG PO TBEC: 5.0000 mg | DELAYED_RELEASE_TABLET | Freq: Every day | ORAL | Status: DC | PRN

## 2016-11-19 MED ORDER — THROMBIN 20000 UNITS EX SOLR
CUTANEOUS | Status: AC
  Filled 2016-11-19: qty 20000

## 2016-11-19 MED ORDER — HYDRALAZINE HCL 20 MG/ML IJ SOLN: 5.0000 mg | INTRAMUSCULAR | Status: DC | PRN

## 2016-11-19 MED ORDER — HYDROMORPHONE HCL 1 MG/ML IJ SOLN
0.5000 mg | INTRAMUSCULAR | Status: DC | PRN
Start: 2016-11-19 — End: 2016-11-22
  Administered 2016-11-19 – 2016-11-20 (×4): 1 mg via INTRAVENOUS
  Filled 2016-11-19 (×4): qty 1

## 2016-11-19 MED ORDER — MEPERIDINE HCL 25 MG/ML IJ SOLN: 6.2500 mg | INTRAMUSCULAR | Status: DC | PRN

## 2016-11-19 MED ORDER — NALOXONE HCL 0.4 MG/ML IJ SOLN
INTRAMUSCULAR | Status: AC
  Filled 2016-11-19: qty 1

## 2016-11-19 MED ORDER — PHENYLEPHRINE HCL 10 MG/ML IJ SOLN
INTRAMUSCULAR | Status: DC | PRN
  Administered 2016-11-19 (×10): 80 ug via INTRAVENOUS

## 2016-11-19 MED ORDER — FENTANYL CITRATE (PF) 250 MCG/5ML IJ SOLN
INTRAMUSCULAR | Status: AC
  Filled 2016-11-19: qty 5

## 2016-11-19 MED ORDER — FLEET ENEMA 7-19 GM/118ML RE ENEM: 1.0000 | ENEMA | Freq: Once | RECTAL | Status: DC | PRN

## 2016-11-19 MED ORDER — GABAPENTIN 300 MG PO CAPS
600.0000 mg | ORAL_CAPSULE | Freq: Three times a day (TID) | ORAL | Status: DC
  Administered 2016-11-19 – 2016-11-22 (×6): 600 mg via ORAL
  Filled 2016-11-19 (×9): qty 2

## 2016-11-19 MED ORDER — ACETAMINOPHEN 10 MG/ML IV SOLN: 1000.0000 mg | Freq: Once | INTRAVENOUS | Status: DC | PRN

## 2016-11-19 MED ORDER — LACTATED RINGERS IV SOLN: INTRAVENOUS | Status: DC | PRN

## 2016-11-19 MED ORDER — ESMOLOL HCL 100 MG/10ML IV SOLN
INTRAVENOUS | Status: DC | PRN
  Administered 2016-11-19 (×2): 30 mg via INTRAVENOUS
  Administered 2016-11-19: 10 mg via INTRAVENOUS
  Administered 2016-11-19: 30 mg via INTRAVENOUS

## 2016-11-19 MED ORDER — NALOXONE HCL 0.4 MG/ML IJ SOLN
INTRAMUSCULAR | Status: DC | PRN
  Administered 2016-11-19: 80 ug via INTRAVENOUS
  Administered 2016-11-19: 120 ug via INTRAVENOUS

## 2016-11-19 MED ORDER — PHENYLEPHRINE HCL 10 MG/ML IJ SOLN
INTRAVENOUS | Status: DC | PRN
  Administered 2016-11-19: 15 ug/min via INTRAVENOUS

## 2016-11-19 MED ORDER — DOCUSATE SODIUM 100 MG PO CAPS
100.0000 mg | ORAL_CAPSULE | Freq: Two times a day (BID) | ORAL | Status: DC
  Administered 2016-11-19 – 2016-11-22 (×5): 100 mg via ORAL
  Filled 2016-11-19 (×6): qty 1

## 2016-11-19 MED ORDER — DEXAMETHASONE SODIUM PHOSPHATE 4 MG/ML IJ SOLN
4.0000 mg | Freq: Three times a day (TID) | INTRAMUSCULAR | Status: DC
  Administered 2016-11-21 – 2016-11-22 (×3): 4 mg via INTRAVENOUS
  Filled 2016-11-19 (×4): qty 1

## 2016-11-19 MED ORDER — ONDANSETRON HCL 4 MG/2ML IJ SOLN
INTRAMUSCULAR | Status: DC | PRN
  Administered 2016-11-19: 4 mg via INTRAVENOUS

## 2016-11-19 MED ORDER — CEFAZOLIN SODIUM-DEXTROSE 2-4 GM/100ML-% IV SOLN
2.0000 g | INTRAVENOUS | Status: AC
  Administered 2016-11-19: 2 g via INTRAVENOUS
  Filled 2016-11-19 (×2): qty 100

## 2016-11-19 MED ORDER — BACITRACIN ZINC 500 UNIT/GM EX OINT
TOPICAL_OINTMENT | CUTANEOUS | Status: AC
  Filled 2016-11-19: qty 28.35

## 2016-11-19 MED ORDER — CEFAZOLIN SODIUM-DEXTROSE 2-4 GM/100ML-% IV SOLN
2.0000 g | Freq: Three times a day (TID) | INTRAVENOUS | Status: AC
  Administered 2016-11-19 – 2016-11-20 (×2): 2 g via INTRAVENOUS
  Filled 2016-11-19 (×2): qty 100

## 2016-11-19 MED ORDER — THROMBIN 5000 UNITS EX SOLR
CUTANEOUS | Status: AC
  Filled 2016-11-19: qty 10000

## 2016-11-19 MED ORDER — DEXAMETHASONE SODIUM PHOSPHATE 10 MG/ML IJ SOLN
INTRAMUSCULAR | Status: DC | PRN
  Administered 2016-11-19: 10 mg via INTRAVENOUS

## 2016-11-19 MED ORDER — SODIUM CHLORIDE 0.9 % IV SOLN
INTRAVENOUS | Status: DC | PRN
  Administered 2016-11-19 (×3): via INTRAVENOUS

## 2016-11-19 MED ORDER — PROPOFOL 10 MG/ML IV BOLUS
INTRAVENOUS | Status: DC | PRN
  Administered 2016-11-19: 100 mg via INTRAVENOUS
  Administered 2016-11-19: 200 mg via INTRAVENOUS
  Administered 2016-11-19 (×2): 100 mg via INTRAVENOUS

## 2016-11-19 MED ORDER — BUPIVACAINE HCL (PF) 0.5 % IJ SOLN
INTRAMUSCULAR | Status: AC
  Filled 2016-11-19: qty 30

## 2016-11-19 MED ORDER — SODIUM CHLORIDE 0.9 % IR SOLN
Status: DC | PRN
  Administered 2016-11-19: 08:00:00

## 2016-11-19 SURGICAL SUPPLY — 107 items
APL SKNCLS STERI-STRIP NONHPOA (GAUZE/BANDAGES/DRESSINGS)
BAG DECANTER FOR FLEXI CONT (MISCELLANEOUS) ×2 IMPLANT
BATTERY IQ STERILE (MISCELLANEOUS) ×1 IMPLANT
BENZOIN TINCTURE PRP APPL 2/3 (GAUZE/BANDAGES/DRESSINGS) IMPLANT
BLADE CLIPPER SURG (BLADE) ×2 IMPLANT
BLADE ULTRA TIP 2M (BLADE) ×2 IMPLANT
BNDG GAUZE ELAST 4 BULKY (GAUZE/BANDAGES/DRESSINGS) IMPLANT
BTRY SRG DRVR 1.5 IQ (MISCELLANEOUS) ×1
BUR ACORN 6.0 PRECISION (BURR) ×2 IMPLANT
BUR MATCHSTICK NEURO 3.0 LAGG (BURR) ×1 IMPLANT
BUR SPIRAL ROUTER 2.3 (BUR) ×1 IMPLANT
CANISTER SUCT 3000ML PPV (MISCELLANEOUS) ×2 IMPLANT
CARTRIDGE OIL MAESTRO DRILL (MISCELLANEOUS) ×1 IMPLANT
CATH ROBINSON RED A/P 14FR (CATHETERS) IMPLANT
CHLORAPREP W/TINT 26ML (MISCELLANEOUS) ×2 IMPLANT
CLIP ANEURY TI PERM 5.0 STD 6M (Clip) ×1 IMPLANT
CLIP ANEURY TI PERM STD 8.3 (Clip) ×2 IMPLANT
CLIP ANEURY TI PERM STD STR 20 (Clip) ×1 IMPLANT
CLIP ANEURY TI TEMP MINI STR 7 (Clip) ×1 IMPLANT
CLIP TI MEDIUM 6 (CLIP) IMPLANT
DECANTER SPIKE VIAL GLASS SM (MISCELLANEOUS) ×2 IMPLANT
DIFFUSER DRILL AIR PNEUMATIC (MISCELLANEOUS) ×2 IMPLANT
DRAPE MICROSCOPE LEICA (MISCELLANEOUS) ×3 IMPLANT
DRAPE NEUROLOGICAL W/INCISE (DRAPES) ×2 IMPLANT
DRAPE SHEET LG 3/4 BI-LAMINATE (DRAPES) ×4 IMPLANT
DRAPE SURG 17X23 STRL (DRAPES) IMPLANT
DRAPE WARM FLUID 44X44 (DRAPE) ×2 IMPLANT
DRSG MEPILEX BORDER 4X12 (GAUZE/BANDAGES/DRESSINGS) IMPLANT
DRSG MEPILEX BORDER 4X8 (GAUZE/BANDAGES/DRESSINGS) ×1 IMPLANT
ELECT REM PT RETURN 9FT ADLT (ELECTROSURGICAL) ×2
ELECTRODE REM PT RTRN 9FT ADLT (ELECTROSURGICAL) ×1 IMPLANT
EVACUATOR 1/8 PVC DRAIN (DRAIN) IMPLANT
EVACUATOR SILICONE 100CC (DRAIN) IMPLANT
FORCEPS BIPOLAR SPETZLER 8 1.0 (NEUROSURGERY SUPPLIES) ×1 IMPLANT
FORMULA INJECT OSTEOVATION 10 (Neurostimulator) ×1 IMPLANT
GAUZE SPONGE 4X4 12PLY STRL (GAUZE/BANDAGES/DRESSINGS) ×2 IMPLANT
GAUZE SPONGE 4X4 16PLY XRAY LF (GAUZE/BANDAGES/DRESSINGS) IMPLANT
GLOVE BIO SURGEON STRL SZ7 (GLOVE) ×1 IMPLANT
GLOVE BIOGEL PI IND STRL 6.5 (GLOVE) IMPLANT
GLOVE BIOGEL PI IND STRL 7.0 (GLOVE) IMPLANT
GLOVE BIOGEL PI IND STRL 7.5 (GLOVE) ×1 IMPLANT
GLOVE BIOGEL PI INDICATOR 6.5 (GLOVE) ×3
GLOVE BIOGEL PI INDICATOR 7.0 (GLOVE)
GLOVE BIOGEL PI INDICATOR 7.5 (GLOVE) ×1
GLOVE ECLIPSE 6.5 STRL STRAW (GLOVE) ×1 IMPLANT
GLOVE EXAM NITRILE LRG STRL (GLOVE) ×2 IMPLANT
GLOVE EXAM NITRILE XL STR (GLOVE) IMPLANT
GLOVE EXAM NITRILE XS STR PU (GLOVE) IMPLANT
GLOVE INDICATOR 7.0 STRL GRN (GLOVE) ×1 IMPLANT
GLOVE INDICATOR 7.5 STRL GRN (GLOVE) ×1 IMPLANT
GLOVE SS BIOGEL STRL SZ 7.5 (GLOVE) ×2 IMPLANT
GLOVE SUPERSENSE BIOGEL SZ 7.5 (GLOVE) ×2
GOWN STRL REUS W/ TWL LRG LVL3 (GOWN DISPOSABLE) ×1 IMPLANT
GOWN STRL REUS W/ TWL XL LVL3 (GOWN DISPOSABLE) IMPLANT
GOWN STRL REUS W/TWL 2XL LVL3 (GOWN DISPOSABLE) IMPLANT
GOWN STRL REUS W/TWL LRG LVL3 (GOWN DISPOSABLE) ×8
GOWN STRL REUS W/TWL XL LVL3 (GOWN DISPOSABLE) ×2
HEMOSTAT POWDER SURGIFOAM 1G (HEMOSTASIS) ×3 IMPLANT
HEMOSTAT SURGICEL 2X14 (HEMOSTASIS) ×1 IMPLANT
HOOK DURA 1/2IN (MISCELLANEOUS) ×2 IMPLANT
KIT BASIN OR (CUSTOM PROCEDURE TRAY) ×2 IMPLANT
KIT ROOM TURNOVER OR (KITS) ×2 IMPLANT
KNIFE ARACHNOID DISP AM-24-S (MISCELLANEOUS) ×2 IMPLANT
MARKER SKIN DUAL TIP RULER LAB (MISCELLANEOUS) ×1 IMPLANT
NDL HYPO 21X1.5 SAFETY (NEEDLE) ×1 IMPLANT
NEEDLE HYPO 21X1.5 SAFETY (NEEDLE) ×2 IMPLANT
NS IRRIG 1000ML POUR BTL (IV SOLUTION) ×4 IMPLANT
OIL CARTRIDGE MAESTRO DRILL (MISCELLANEOUS) ×2
PACK CRANIOTOMY (CUSTOM PROCEDURE TRAY) ×2 IMPLANT
PATTIES SURGICAL .25X.25 (GAUZE/BANDAGES/DRESSINGS) ×1 IMPLANT
PATTIES SURGICAL .5 X.5 (GAUZE/BANDAGES/DRESSINGS) IMPLANT
PATTIES SURGICAL .5 X3 (DISPOSABLE) IMPLANT
PATTIES SURGICAL 1X1 (DISPOSABLE) IMPLANT
PIN MAYFIELD SKULL DISP (PIN) ×2 IMPLANT
PLATE 1.5  2HOLE LNG NEURO (Plate) ×3 IMPLANT
PLATE 1.5 2HOLE LNG NEURO (Plate) IMPLANT
PROBE FOR NEUROSURGERY (MISCELLANEOUS) ×1 IMPLANT
RUBBERBAND STERILE (MISCELLANEOUS) ×4 IMPLANT
SCREW SELF DRILL HT 1.5/4MM (Screw) ×6 IMPLANT
SPONGE LAP 18X18 X RAY DECT (DISPOSABLE) ×2 IMPLANT
SPONGE NEURO XRAY DETECT 1X3 (DISPOSABLE) IMPLANT
SPONGE SURGIFOAM ABS GEL 100 (HEMOSTASIS) ×3 IMPLANT
SPONGE SURGIFOAM ABS GEL 100C (HEMOSTASIS) ×1 IMPLANT
STAPLER VISISTAT 35W (STAPLE) ×3 IMPLANT
STOCKINETTE 6  STRL (DRAPES) ×1
STOCKINETTE 6 STRL (DRAPES) ×1 IMPLANT
STRIP SURGICAL 1 X 6 IN (GAUZE/BANDAGES/DRESSINGS) ×1 IMPLANT
STRIP SURGICAL 1/2 X 6 IN (GAUZE/BANDAGES/DRESSINGS) ×2 IMPLANT
STRIP SURGICAL 1/4 X 6 IN (GAUZE/BANDAGES/DRESSINGS) ×2 IMPLANT
STRIP SURGICAL 2 X 6 IN (GAUZE/BANDAGES/DRESSINGS) ×2 IMPLANT
STRIP SURGICAL 3/4 X 6 IN (GAUZE/BANDAGES/DRESSINGS) ×2 IMPLANT
SUT ETHILON 3 0 FSL (SUTURE) IMPLANT
SUT ETHILON 3 0 PS 1 (SUTURE) IMPLANT
SUT NURALON 4 0 TR CR/8 (SUTURE) ×4 IMPLANT
SUT STRATAFIX 1PDS 45CM VIOLET (SUTURE) ×2 IMPLANT
SUT VIC AB 0 CT1 18XCR BRD8 (SUTURE) IMPLANT
SUT VIC AB 0 CT1 8-18 (SUTURE) ×4
SUT VIC AB 2-0 CT1 18 (SUTURE) ×2 IMPLANT
SYR 30ML LL (SYRINGE) ×2 IMPLANT
SYSTEM DELIVERY OSTEOVATION (MISCELLANEOUS) ×1 IMPLANT
TOWEL GREEN STERILE (TOWEL DISPOSABLE) ×2 IMPLANT
TOWEL GREEN STERILE FF (TOWEL DISPOSABLE) ×2 IMPLANT
TRAY FOLEY W/METER SILVER 16FR (SET/KITS/TRAYS/PACK) ×2 IMPLANT
TUBE CONNECTING 12X1/4 (SUCTIONS) ×2 IMPLANT
TUBING BULK SUCTION (MISCELLANEOUS) ×1 IMPLANT
UNDERPAD 30X30 (UNDERPADS AND DIAPERS) ×2 IMPLANT
WATER STERILE IRR 1000ML POUR (IV SOLUTION) ×2 IMPLANT

## 2016-11-19 NOTE — Progress Notes (Signed)
No changes overnight Awake, alert, oriented Unchanged left third nerve palsy Moving all extremities well All questions answered, ready for surgery

## 2016-11-19 NOTE — Anesthesia Postprocedure Evaluation (Signed)
Anesthesia Post Note  Patient: Jennifer Jacobs  Procedure(s) Performed: Procedure(s) (LRB): CRANIOTOMY INTRACRANIAL FOR  ANEURYSM CLIPPING (Left)  Patient location during evaluation: PACU Anesthesia Type: General Level of consciousness: confused Pain management: pain level controlled Respiratory status: spontaneous breathing Cardiovascular status: stable Postop Assessment: no signs of nausea or vomiting Anesthetic complications: no       Last Vitals:  Vitals:   11/19/16 0600 11/19/16 1300  BP: 127/86   Pulse: 78 90  Resp: (!) 21 (!) 23  Temp:  36.2 C    Last Pain:  Vitals:   11/19/16 0600  TempSrc:   PainSc: 5                  [employer]

## 2016-11-19 NOTE — Brief Op Note (Signed)
11/17/2016 - 11/19/2016  12:24 PM  PATIENT:  Jennifer Jacobs  71 y.o. female  PRE-OPERATIVE DIAGNOSIS:  Left posterior communicating artery and middle cerebral artery aneurysms, acute left third nerve palsy  POST-OPERATIVE DIAGNOSIS:  Same  PROCEDURE:  Procedure(s): CRANIOTOMY INTRACRANIAL FOR  ANEURYSM CLIPPING (Left)  SURGEON:  Surgeon(s) and Role:    * Truitt Merle, MD - Primary    * Coletta Memos, MD - Assisting  PHYSICIAN ASSISTANT:   ASSISTANTS: none   ANESTHESIA:   general  EBL:  Total I/O In: 1950 [I.V.:1700; IV Piggyback:250] Out: 2125 [Urine:1525; Blood:600]  BLOOD ADMINISTERED:none  DRAINS: none   LOCAL MEDICATIONS USED:  MARCAINE    and LIDOCAINE   SPECIMEN:  No Specimen  DISPOSITION OF SPECIMEN:  N/A  COUNTS:  YES  TOURNIQUET:  * No tourniquets in log *  DICTATION: .Dragon Dictation  PLAN OF CARE: Admit to inpatient   PATIENT DISPOSITION:  ICU - extubated and stable.   Delay start of Pharmacological VTE agent (>24hrs) due to surgical blood loss or risk of bleeding: yes

## 2016-11-19 NOTE — Anesthesia Procedure Notes (Signed)
Procedure Name: Intubation Date/Time: 11/19/2016 7:59 AM Performed by: Sampson Si E Pre-anesthesia Checklist: Patient identified, Emergency Drugs available, Suction available and Patient being monitored Patient Re-evaluated:Patient Re-evaluated prior to inductionOxygen Delivery Method: Circle System Utilized Preoxygenation: Pre-oxygenation with 100% oxygen Intubation Type: IV induction Ventilation: Mask ventilation without difficulty Laryngoscope Size: Mac and 3 Grade View: Grade I Tube type: Subglottic suction tube Tube size: 7.5 mm Number of attempts: 1 Airway Equipment and Method: Stylet Placement Confirmation: ETT inserted through vocal cords under direct vision,  positive ETCO2 and breath sounds checked- equal and bilateral Secured at: 20 cm Tube secured with: Tape Dental Injury: Teeth and Oropharynx as per pre-operative assessment

## 2016-11-19 NOTE — Transfer of Care (Signed)
Immediate Anesthesia Transfer of Care Note  Patient: Jennifer Jacobs  Procedure(s) Performed: Procedure(s): CRANIOTOMY INTRACRANIAL FOR  ANEURYSM CLIPPING (Left)  Patient Location: PACU  Anesthesia Type:General  Level of Consciousness: lethargic  Airway & Oxygen Therapy: Patient Spontanous Breathing and Patient connected to face mask oxygen  Post-op Assessment: Report given to RN and Patient moving all extremities X 4  Post vital signs: Reviewed  Last Vitals:  Vitals:   11/19/16 0500 11/19/16 0600  BP: (!) 149/87 127/86  Pulse: 84 78  Resp: (!) 29 (!) 21  Temp:      Last Pain:  Vitals:   11/19/16 0600  TempSrc:   PainSc: 5          Complications: No apparent anesthesia complications

## 2016-11-19 NOTE — Progress Notes (Signed)
Patient reports pain rated 7/10 to head

## 2016-11-19 NOTE — Progress Notes (Signed)
Patient taken to OR holding at 0645 and bed side report given to CRNA

## 2016-11-20 ENCOUNTER — Inpatient Hospital Stay (HOSPITAL_COMMUNITY): Payer: Medicare Other

## 2016-11-20 ENCOUNTER — Encounter (HOSPITAL_COMMUNITY): Payer: Self-pay | Admitting: Neurological Surgery

## 2016-11-20 LAB — CBC
HEMATOCRIT: 29.5 % — AB (ref 36.0–46.0)
HEMOGLOBIN: 9.8 g/dL — AB (ref 12.0–15.0)
MCH: 30.8 pg (ref 26.0–34.0)
MCHC: 33.2 g/dL (ref 30.0–36.0)
MCV: 92.8 fL (ref 78.0–100.0)
Platelets: 176 10*3/uL (ref 150–400)
RBC: 3.18 MIL/uL — ABNORMAL LOW (ref 3.87–5.11)
RDW: 14.2 % (ref 11.5–15.5)
WBC: 21.8 10*3/uL — AB (ref 4.0–10.5)

## 2016-11-20 LAB — BASIC METABOLIC PANEL
Anion gap: 5 (ref 5–15)
BUN: 12 mg/dL (ref 6–20)
CALCIUM: 8.7 mg/dL — AB (ref 8.9–10.3)
CO2: 25 mmol/L (ref 22–32)
CREATININE: 1.02 mg/dL — AB (ref 0.44–1.00)
Chloride: 113 mmol/L — ABNORMAL HIGH (ref 101–111)
GFR, EST NON AFRICAN AMERICAN: 55 mL/min — AB (ref >60)
Glucose, Bld: 125 mg/dL — ABNORMAL HIGH (ref 65–99)
Potassium: 3.8 mmol/L (ref 3.5–5.1)
SODIUM: 143 mmol/L (ref 135–145)

## 2016-11-20 MED ORDER — PANTOPRAZOLE SODIUM 40 MG PO TBEC
40.0000 mg | DELAYED_RELEASE_TABLET | Freq: Every day | ORAL | Status: DC
  Administered 2016-11-21: 40 mg via ORAL
  Filled 2016-11-20 (×2): qty 1

## 2016-11-20 MED FILL — Thrombin For Soln 5000 Unit: CUTANEOUS | Qty: 5000 | Status: AC

## 2016-11-20 NOTE — Progress Notes (Signed)
Pt seen and examined. Episode of agitation after receiving morphine yesterday. Resolved. Reports she is doing better than yesterday Pain is well controlled Would like something to eat - still waiting on breakfast Denies neuro symptoms/focal deficits other than known left eye CN palsy  EXAM: Temp:  [97.2 F (36.2 C)-99.5 F (37.5 C)] 98.9 F (37.2 C) (04/03 0440) Pulse Rate:  [70-90] 75 (04/03 0700) Resp:  [11-23] 19 (04/03 0700) BP: (68-151)/(52-137) 110/78 (04/03 0600) SpO2:  [87 %-100 %] 97 % (04/03 0700) Arterial Line BP: (97-165)/(48-80) 153/71 (04/03 0700) Intake/Output      04/02 0701 - 04/03 0700 04/03 0701 - 04/04 0700   P.O.     I.V. (mL/kg) 3375 (41.4)    IV Piggyback 450    Total Intake(mL/kg) 3825 (46.9)    Urine (mL/kg/hr) 3775 (1.9)    Blood 600 (0.3)    Total Output 4375     Net -550           Awake and alert Follows commands throughout Full strength Known left CN 3 palsy - pupil dilated, nonreactive. Ptosis of left eye. Preserved lateral gaze. Wound c/d/i  Stable Continue current care

## 2016-11-20 NOTE — Evaluation (Signed)
Occupational Therapy Evaluation Patient Details Name: Jennifer Jacobs MRN: [id number] DOB: August 19, 1946 Today's Date: 11/20/2016    History of Present Illness Patient is a 71 y/o female who transferred from [facility] with new onset headache, third nerve palsy and left posterior communicating artery aneurysm and MCA aneurysm now s/p craniotomy intracranial for aneurysm clipping. PMH includes HLD, HTN, MI, ETOH abusem seizure, depression.    Clinical Impression   Pt admitted with above. She demonstrates the below listed deficits and will benefit from continued OT to maximize safety and independence with BADLs.  Pt presents to OT with tremors bil. UEs which she reports is new, and results in impaired coordination, impaired cognition including deficits with sequencing, problem solving, and organization, balance deficits, and visual deficit - has 3rd nerve palsy.  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  Feel she would benefit from CIR to allow her to return home mod I - supervision.       Follow Up Recommendations  CIR;Supervision/Assistance - 24 hour    Equipment Recommendations  Tub/shower bench    Recommendations for Other Services Rehab consult     Precautions / Restrictions Precautions Precautions: Fall Precaution Comments: left eye ptosis Restrictions Weight Bearing Restrictions: No      Mobility Bed Mobility Overal bed mobility: Needs Assistance Bed Mobility: Supine to Sit     Supine to sit: Supervision;HOB elevated     General bed mobility comments: Pt deferred due to just have gotten back in bed   Transfers Overall transfer level: Needs assistance Equipment used: Rolling walker (2 wheeled) Transfers: Sit to/from Stand Sit to Stand: Min assist         General transfer comment: Not assessed due to pt just had gotten back to bed and is eating lunch     Balance Overall balance assessment: Needs assistance Sitting-balance  support: Feet supported;No upper extremity supported Sitting balance-Leahy Scale: Fair     Standing balance support: During functional activity;Bilateral upper extremity supported Standing balance-Leahy Scale: Poor Standing balance comment: Reliant on BUEs for support in standing. + dizziness.                           ADL either performed or assessed with clinical judgement   ADL Overall ADL's : Needs assistance/impaired Eating/Feeding: Set up;Bed level   Grooming: Wash/dry hands;Wash/dry face;Oral care;Set up;Supervision/safety;Bed level   Upper Body Bathing: Minimal assistance;Bed level;Sitting   Lower Body Bathing: Moderate assistance;Sit to/from stand   Upper Body Dressing : Minimal assistance;Sitting   Lower Body Dressing: Moderate assistance;Sit to/from stand   Toilet Transfer: Minimal assistance;Ambulation;Comfort height toilet;RW   Toileting- Clothing Manipulation and Hygiene: Minimal assistance;Sit to/from stand       Functional mobility during ADLs: Minimal assistance;Rolling walker General ADL Comments: Pt limited by pain and fatigue      Vision Baseline Vision/History: Wears glasses;Cataracts Wears Glasses: At all times Patient Visual Report: Diplopia;Other (comment) (ptosis ) Additional Comments: Pt does not have glasses with her.  Pt with ptosis Lt eye, and only able to open lid with physical assistance.  She reports diplopia with lid open.  Did not formally assess Lt eye this date due to headache/pain.   She requires min cues to scan lunch tray for items      Perception Perception Perception Tested?: Yes   Praxis Praxis Praxis tested?: Within functional limits    Pertinent Vitals/Pain Pain Assessment: 0-10 Pain  Score: 8  Pain Location: headache Pain Descriptors / Indicators: Aching;Headache Pain Intervention(s): Monitored during session     Hand Dominance Right   Extremity/Trunk Assessment Upper Extremity Assessment Upper Extremity  Assessment: RUE deficits/detail;LUE deficits/detail;Generalized weakness RUE Deficits / Details: tremort noted bil. UEs that she reports is new since surgery  RUE Coordination: decreased fine motor LUE Deficits / Details: tremort noted bil. UEs that she reports is new since surgery LUE Coordination: decreased fine motor   Lower Extremity Assessment Lower Extremity Assessment: Defer to PT evaluation RLE Deficits / Details: Grossly ~4/5 throughout. RLE Sensation:  (WFL) LLE Deficits / Details: Grossly ~3/5 throughout, instability noted during functional tasks. LLE Sensation:  (WFL)   Cervical / Trunk Assessment Cervical / Trunk Assessment: Normal   Communication Communication Communication: No difficulties   Cognition Arousal/Alertness: Awake/alert Behavior During Therapy: Flat affect Overall Cognitive Status: Impaired/Different from baseline Area of Impairment: Attention;Memory;Problem solving                   Current Attention Level: Selective Memory: Decreased short-term memory       Problem Solving: Requires tactile cues;Difficulty sequencing General Comments: Pt noted to repeat self occasionally, she requires min cues for organization and sequencing and min cues for problem solving    General Comments  Supine BP 105/69, Sitting BP 101/76, Sitting post ambulation 117/70. Son present during session.    Exercises     Shoulder Instructions      Home Living Family/patient expects to be discharged to:: Private residence Living Arrangements: Children Available Help at Discharge: Family;Available 24 hours/day Type of Home: House Home Access: Stairs to enter [employer] of Steps: 3 Entrance Stairs-Rails: Right Home Layout: One level     Bathroom Shower/Tub: Chief Strategy Officer: Standard     Home Equipment: Gilmer Mor - single point      Lives With: Alone    Prior Functioning/Environment Level of Independence: Independent with  assistive device(s)        Comments: Uses [employer] PTA. Drives. Works as [employer]         OT Problem List: Decreased strength;Decreased range of motion;Decreased activity tolerance;Impaired balance (sitting and/or standing);Impaired vision/perception;Decreased cognition;Decreased safety awareness;Decreased knowledge of use of DME or AE;Impaired UE functional use      OT Treatment/Interventions: Self-care/ADL training;DME and/or AE instruction;Therapeutic activities;Cognitive remediation/compensation;Visual/perceptual remediation/compensation;Balance training;Patient/family education    OT Goals(Current goals can be found in the care plan section) Acute Rehab OT Goals Patient Stated Goal: to have less pain  OT Goal Formulation: With patient Time For Goal Achievement: 12/04/16 Potential to Achieve Goals: Good ADL Goals Pt Will Perform Eating: with modified independence Pt Will Perform Grooming: with supervision;standing Pt Will Perform Upper Body Bathing: with supervision;sitting Pt Will Perform Lower Body Bathing: with supervision;sit to/from stand Pt Will Perform Upper Body Dressing: with supervision;sitting Pt Will Perform Lower Body Dressing: with supervision;sit to/from stand Pt Will Transfer to Toilet: with supervision;ambulating;regular height toilet;bedside commode;grab bars Pt Will Perform Toileting - Clothing Manipulation and hygiene: with supervision;sit to/from stand Additional ADL Goal #1: Pt will be independent with HEP for vision.    OT Frequency: Min 2X/week   Barriers to D/C:            Co-evaluation              End of Session Nurse Communication: Mobility status  Activity Tolerance: Patient limited by pain;Patient limited by fatigue Patient left: in bed;with call bell/phone within reach  OT Visit Diagnosis: Pain;Cognitive  communication deficit (R41.841);Unsteadiness on feet (R26.81) Symptoms and signs involving cognitive functions: Other cerebrovascular  disease Pain - part of body:  (head )                Time: 4098-1191 OT Time Calculation (min): 15 min Charges:  OT General Charges $OT Visit: 1 Procedure OT Evaluation $OT Eval Moderate Complexity: 1 Procedure G-Codes:     Jeani Hawking, OTR/L [phone]   Jeani Hawking M 11/20/2016, 1:38 PM

## 2016-11-20 NOTE — Progress Notes (Signed)
Doing well Unchanged left third nerve palsy; otherwise intact Transfer to floor Third nerve will hopefully recover in several months

## 2016-11-20 NOTE — Evaluation (Signed)
Physical Therapy Evaluation Patient Details Name: Jennifer Jacobs MRN: [id number] DOB: 1946-01-14 Today's Date: 11/20/2016   History of Present Illness  Patient is a 71 y/o female who transferred from [facility] with new onset headache, third nerve palsy and left posterior communicating artery aneurysm and MCA aneurysm now s/p craniotomy intracranial for aneurysm clipping. PMH includes HLD, HTN, MI, ETOH abusem seizure, depression.   Clinical Impression  Patient presents with headache, left sided weakness, ptosis left eye, question ?visual deficits/cut right eye and impaired mobility s/p above. Pt Mod I with [employer] PTA and working. Pt plans to d/c home with son and son's g/f and will have 24/7. Tolerated gait training with Min A for balance/safety due to visual deficits, weakness and running into objects in right environment. Limited by headache worsened with light and movement. Would benefit from CIR to maximize independence and mobility prior to return home.     Follow Up Recommendations CIR;Supervision for mobility/OOB;Supervision/Assistance - 24 hour    Equipment Recommendations  Rolling walker with 5" wheels    Recommendations for Other Services OT consult;Rehab consult;Speech consult     Precautions / Restrictions Precautions Precautions: Fall Precaution Comments: left eye ptosis Restrictions Weight Bearing Restrictions: No      Mobility  Bed Mobility Overal bed mobility: Needs Assistance Bed Mobility: Supine to Sit     Supine to sit: Supervision;HOB elevated     General bed mobility comments: No assist needed but use of rail. Dizziness getting to EOB but resolved. BP 105/69- BP 101/76  Transfers Overall transfer level: Needs assistance Equipment used: Rolling walker (2 wheeled) Transfers: Sit to/from Stand Sit to Stand: Min assist         General transfer comment: Min A to steady in standing due to dizziness/weakness. Cues to keep eye opened.    Ambulation/Gait Ambulation/Gait assistance: Min assist Ambulation Distance (Feet): 30 Feet Assistive device: Rolling walker (2 wheeled) Gait Pattern/deviations: Step-through pattern;Decreased stride length;Trunk flexed;Narrow base of support;Staggering right Gait velocity: decreased Gait velocity interpretation: Below normal speed for age/gender General Gait Details: Slow, unsteady gait with right knee instability and running into objects on right side requiring assist for navigating and RW management. Question vision cuts/deficit on right?  Stairs            Wheelchair Mobility    Modified Rankin (Stroke Patients Only) Modified Rankin (Stroke Patients Only) Pre-Morbid Rankin Score: No symptoms Modified Rankin: Moderately severe disability     Balance Overall balance assessment: Needs assistance Sitting-balance support: Feet supported;No upper extremity supported Sitting balance-Leahy Scale: Fair     Standing balance support: During functional activity;Bilateral upper extremity supported Standing balance-Leahy Scale: Poor Standing balance comment: Reliant on BUEs for support in standing. + dizziness.                             Pertinent Vitals/Pain Pain Assessment: 0-10 Pain Score: 8  Pain Location: headache Pain Descriptors / Indicators: Aching;Headache Pain Intervention(s): Monitored during session;Repositioned;Premedicated before session;Limited activity within patient's tolerance    Home Living Family/patient expects to be discharged to:: Private residence Living Arrangements: Children (lives alone but plans to stay with son and son's gf) Available Help at Discharge: Family;Available 24 hours/day Type of Home: House Home Access: Stairs to enter Entrance Stairs-Rails: Right Entrance Stairs-Number of Steps: 3 Home Layout: One level Home Equipment: Cane - single point      Prior Function Level of Independence: Independent with assistive  device(s)  Comments: Uses [employer] PTA. 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      Hand Dominance   Dominant Hand: Right    Extremity/Trunk Assessment   Upper Extremity Assessment Upper Extremity Assessment: Defer to OT evaluation    Lower Extremity Assessment Lower Extremity Assessment: LLE deficits/detail;RLE deficits/detail RLE Deficits / Details: Grossly ~4/5 throughout. RLE Sensation:  (WFL) LLE Deficits / Details: Grossly ~3/5 throughout, instability noted during functional tasks. LLE Sensation:  (WFL)    Cervical / Trunk Assessment Cervical / Trunk Assessment: Normal  Communication   Communication: No difficulties  Cognition Arousal/Alertness: Awake/alert Behavior During Therapy: WFL for tasks assessed/performed Overall Cognitive Status: Within Functional Limits for tasks assessed                                        General Comments General comments (skin integrity, edema, etc.): Supine BP 105/69, Sitting BP 101/76, Sitting post ambulation 117/70. Son present during session.    Exercises     Assessment/Plan    PT Assessment Patient needs continued PT services  PT Problem List Decreased strength;Decreased mobility;Decreased safety awareness;Decreased balance;Pain;Decreased knowledge of use of DME;Decreased activity tolerance;Cardiopulmonary status limiting activity       PT Treatment Interventions Therapeutic activities;Gait training;Therapeutic exercise;Balance training;Functional mobility training;Stair training;Neuromuscular re-education;Patient/family education;DME instruction    PT Goals (Current goals can be found in the Care Plan section)  Acute Rehab PT Goals Patient Stated Goal: to make this headache go away PT Goal Formulation: With patient Time For Goal Achievement: 12/04/16 Potential to Achieve Goals: Good    Frequency Min 4X/week   Barriers to discharge Inaccessible home environment stairs to enter home; plans to stay with  son and son's g/f who can provide 24/7.    Co-evaluation               End of Session Equipment Utilized During Treatment: Gait belt Activity Tolerance: Patient tolerated treatment well Patient left: in chair;with call bell/phone within reach;with family/visitor present Nurse Communication: Mobility status PT Visit Diagnosis: Unsteadiness on feet (R26.81);Muscle weakness (generalized) (M62.81);Pain Pain - part of body:  (head)    Time: 1610-9604 PT Time Calculation (min) (ACUTE ONLY): 29 min   Charges:   PT Evaluation $PT Eval Moderate Complexity: 1 Procedure PT Treatments $Gait Training: 8-22 mins   PT G Codes:        Mylo Red, PT, DPT [phone]    Blake Divine A Nicollette Wilhelmi 11/20/2016, 11:34 AM

## 2016-11-20 NOTE — Progress Notes (Signed)
Rehab Admissions Coordinator Note:  Patient was screened by Trish Mage for appropriateness for an Inpatient Acute Rehab Consult.  At this time, we are recommending Inpatient Rehab consult.  Trish Mage 11/20/2016, 12:28 PM  I can be reached at [phone].

## 2016-11-21 ENCOUNTER — Telehealth (HOSPITAL_COMMUNITY): Payer: Self-pay | Admitting: *Deleted

## 2016-11-21 ENCOUNTER — Encounter (HOSPITAL_COMMUNITY): Payer: Self-pay | Admitting: Physical Medicine & Rehabilitation

## 2016-11-21 DIAGNOSIS — I671 Cerebral aneurysm, nonruptured: Principal | ICD-10-CM

## 2016-11-21 DIAGNOSIS — I10 Essential (primary) hypertension: Secondary | ICD-10-CM

## 2016-11-21 DIAGNOSIS — D62 Acute posthemorrhagic anemia: Secondary | ICD-10-CM

## 2016-11-21 DIAGNOSIS — D72829 Elevated white blood cell count, unspecified: Secondary | ICD-10-CM

## 2016-11-21 DIAGNOSIS — M797 Fibromyalgia: Secondary | ICD-10-CM

## 2016-11-21 DIAGNOSIS — I729 Aneurysm of unspecified site: Secondary | ICD-10-CM

## 2016-11-21 DIAGNOSIS — G40909 Epilepsy, unspecified, not intractable, without status epilepticus: Secondary | ICD-10-CM

## 2016-11-21 MED ORDER — LEVETIRACETAM 500 MG PO TABS
500.0000 mg | ORAL_TABLET | Freq: Two times a day (BID) | ORAL | Status: DC
  Administered 2016-11-21 – 2016-11-22 (×3): 500 mg via ORAL
  Filled 2016-11-21 (×3): qty 1

## 2016-11-21 NOTE — Consult Note (Signed)
Physical Medicine and Rehabilitation Consult   Reason for Consult: Left PCS and MCA aneurysms.  Referring Physician: Dr. Cyndy Freeze.    HPI: Jennifer Jacobs is a 71 y.o. female with history of HTN, fibromyalgia, seizure disorder who was admitted via [facility] 11/17/16 with acute onset of headache, photophobia and blurry vision progressing BLE weakness and left CN III palsy. She was found to have left MCA aneurysm and left posterior communicating artery aneurysm. She underwent left crani for aneurysm clipping by Dr. Cyndy Freeze. Post op with agitation due to morphine. CT head 4/3 reviewed, showing aneurysm clips. PT/OT evaluations done on 4/2 revealing decrease in balance, coordination, ataxia, difficulty with ADL tasks and staggering gait. CIR recommended for follow up therapy.    Review of Systems  Constitutional: Positive for chills.  HENT: Positive for hearing loss.   Eyes: Positive for blurred vision and double vision (ongoing of years).  Respiratory: Positive for cough and shortness of breath (with activity. ).   Cardiovascular: Positive for chest pain (with activity), palpitations and leg swelling.  Gastrointestinal: Positive for heartburn, nausea (chronic) and vomiting. Negative for constipation.  Genitourinary: Negative for dysuria and urgency.       Occasional incontinence.   Musculoskeletal: Positive for back pain and joint pain (hip, knee, ).  Neurological: Positive for dizziness, sensory change and headaches.  All other systems reviewed and are negative.     Past Medical History:  Diagnosis Date  . Allergy   . Anxiety   . Anxiety and depression   . Arthritis   . Depression   . Dyslipidemia   . Fibromyalgia   . Hepatic cyst   . Hyperlipidemia   . Hypertension   . MI (myocardial infarction) 2007   By patient report, not substantiated by recent nuclear stress test.  . Neuromuscular disorder ([place])   . Seizure disorder ([place])    none since 2002  . Seizures ([place])   . Smoker  unmotivated to quit    Quit for 3 years and then restarted  . Substance abuse    ETOH  . Vision abnormalities     Past Surgical History:  Procedure Laterality Date  . CRANIOTOMY Left 11/19/2016   Procedure: CRANIOTOMY INTRACRANIAL FOR  ANEURYSM CLIPPING;  Surgeon: Kevan Ny Ditty, MD;  Location: [facility];  Service: Neurosurgery;  Laterality: Left;  . ELBOW ARTHROPLASTY Left    rods  . Exercise tolerance test  04/27/2013   CPET-MET: Submaximal effort (0.96, with goal of greater than 1.09) -- patient states that her effort was limited due to knee pain.  This makes the rest of the interpretation difficult: Peak VO2 - 10.5 (59% moderate), peak 02-pulse -  7..11 (low); heart rate 114 (73% -chronic incompetence considered);; PFTs normal   . MOUTH SURGERY    . NM MYOVIEW LTD  05/19/2013   LexiScan: EF 80%, no evidence of ischemia or infarction  . TRANSTHORACIC ECHOCARDIOGRAM  03/25/2013   EF 55-60%, no regional wall motion abnormalities; normal diastolic parameters, normal pulmonary pressures.  No valvular lesions noted.  Essentially normal echo     Family History  Problem Relation Age of Onset  . Heart attack Father 44  . Hypertension Father   . Alcohol abuse Father   . Heart attack Maternal Grandmother   . Cancer Maternal Grandfather     type unknown  . Cancer Paternal Grandmother     type unknown  . Depression Mother   . Alcohol abuse Mother   . Ovarian  cancer Paternal Aunt   . Cancer Paternal Aunt     ovarian  . Prostate cancer Paternal Uncle   . Stomach cancer Paternal Aunt   . Diabetes Daughter   . Heart disease Daughter 65    heart attack  . Depression Sister   . Alcohol abuse Brother   . Depression Sister   . Alcohol abuse Sister   . Depression Sister   . Alcohol abuse Sister   . Alcohol abuse Brother   . Alcohol abuse Son     Social History:   Lives with alone.  Works as a Psychologist, clinical.  She reports that she quit smoking 2-3 years ago.  She has been smoking on and off  since age 38.  She has never used smokeless tobacco. She reports that she quit using Marijuana and alcohol three years ago.     Allergies  Allergen Reactions  . Prozac [Fluoxetine Hcl] Other (See Comments)    Headaches    Medications Prior to Admission  Medication Sig Dispense Refill  . aspirin EC 81 MG tablet Take 81 mg by mouth daily.    Marland Kitchen docusate sodium (COLACE) 100 MG capsule Take 100 mg by mouth daily as needed for mild constipation (constipation).     Marland Kitchen escitalopram (LEXAPRO) 20 MG tablet Take 1 tablet (20 mg total) by mouth 2 (two) times daily. 60 tablet 2  . gabapentin (NEURONTIN) 300 MG capsule Take 2 capsules (600 mg total) by mouth 3 (three) times daily. 180 capsule 5  . lamoTRIgine (LAMICTAL) 100 MG tablet Take 1 tablet (100 mg total) by mouth 2 (two) times daily. 60 tablet 2  . lisinopril-hydrochlorothiazide (PRINZIDE,ZESTORETIC) 20-25 MG tablet Take 1 tablet by mouth daily. 90 tablet 3  . metoprolol tartrate (LOPRESSOR) 25 MG tablet Take 1 tablet (25 mg total) by mouth 2 (two) times daily. 180 tablet 3  . Multiple Vitamins-Minerals (CENTRUM SILVER ADULT 50+ PO) Take 1 tablet by mouth daily.    . naproxen sodium (ANAPROX) 220 MG tablet Take 220-440 mg by mouth 2 (two) times daily as needed (for pain).    . traZODone (DESYREL) 150 MG tablet Take 1 tablet (150 mg total) by mouth at bedtime. 30 tablet 2    Home: Home Living Family/patient expects to be discharged to:: Private residence Living Arrangements: Children Available Help at Discharge: Family, Available 24 hours/day Type of Home: House Home Access: Stairs to enter [employer] of Steps: 3 Entrance Stairs-Rails: Right Home Layout: One level Bathroom Shower/Tub: Chiropodist: Standard Home Equipment: Radio producer - single point  Lives With: Alone  Functional History: Prior Function Level of Independence: Independent with assistive device(s) Comments: Uses SPC PTA. Drives. Works as [employer]    Functional Status:  Mobility: Bed Mobility Overal bed mobility: Needs Assistance Bed Mobility: Supine to Sit Supine to sit: Supervision, HOB elevated General bed mobility comments: pt initiated sitting toward side of bed with rail up and required verbal cues to sit EOB on side with rail down. no physical assist needed. Dizziness upon sitting upright which resolved. BP 116/73. Transfers Overall transfer level: Needs assistance Equipment used: Rolling walker (2 wheeled) Transfers: Sit to/from Stand Sit to Stand: Min guard General transfer comment: cues for hand placement and safety with RW. min guard for safety. no reports of dizziness Ambulation/Gait Ambulation/Gait assistance: Min assist Ambulation Distance (Feet): 75 Feet Assistive device: Rolling walker (2 wheeled) Gait Pattern/deviations: Step-through pattern, Decreased stride length, Staggering right, Trunk flexed, Narrow base of support, Ataxic General  Gait Details: verbal cues for turning and navigation of hallways due to visual field deficits. required increased time to navigate around obstalces on L due to visual field deficits. pt with ataxic uncoordinated ambulation with near crossover stepping pattern. pt with noted increased use of UE on RW. standing rest break x2 for 30 sec each Gait velocity: decreased Gait velocity interpretation: Below normal speed for age/gender    ADL: ADL Overall ADL's : Needs assistance/impaired Eating/Feeding: Set up, Bed level Grooming: Wash/dry hands, Wash/dry face, Oral care, Set up, Supervision/safety, Bed level Upper Body Bathing: Minimal assistance, Bed level, Sitting Lower Body Bathing: Moderate assistance, Sit to/from stand Upper Body Dressing : Minimal assistance, Sitting Lower Body Dressing: Moderate assistance, Sit to/from stand Toilet Transfer: Minimal assistance, Ambulation, Comfort height toilet, RW Toileting- Clothing Manipulation and Hygiene: Minimal assistance, Sit to/from  stand Functional mobility during ADLs: Minimal assistance, Rolling walker General ADL Comments: Pt limited by pain and fatigue   Cognition: Cognition Overall Cognitive Status: Impaired/Different from baseline Arousal/Alertness: Awake/alert Orientation Level: Oriented X4 Cognition Arousal/Alertness: Awake/alert Behavior During Therapy: WFL for tasks assessed/performed Overall Cognitive Status: Impaired/Different from baseline Area of Impairment: Memory, Problem solving Current Attention Level: Selective Memory: Decreased short-term memory Problem Solving: Difficulty sequencing, Requires verbal cues, Requires tactile cues General Comments: requires multimodal cues for safety and sequencing of transfers and ambulation  Blood pressure (!) 121/94, pulse 79, temperature 98.5 F (36.9 C), temperature source Oral, resp. rate 18, height _0  (1.727 m), weight 81.6 kg (180 lb), SpO2 100 %. Physical Exam  Nursing note and vitals reviewed. Constitutional: She is oriented to person, place, and time. She appears well-developed and well-nourished.  HENT:  Head: Atraumatic.  Left scalp incision c/d/i  Eyes:  Left ptosis with lid edema and erythema.   Neck: Normal range of motion. Neck supple.  Cardiovascular: Normal rate and regular rhythm.   Respiratory: Effort normal and breath sounds normal. No stridor.  GI: Soft. Bowel sounds are normal. She exhibits no distension. There is no tenderness.  Musculoskeletal: She exhibits no edema or tenderness.  Neurological: She is alert and oriented to person, place, and time.  Speech clear.  Able to follow commands without difficulty.  Motor: RUE 5/5 RLE 4-/5 HF, KE, 5/5 ADF/PF LUE 4/5 LLE 3+/5 HF, KE, 4/5 ADF/PF Sensation intact to light touch DTRs symmetric  Skin: Skin is warm and dry.  Psychiatric: She has a normal mood and affect. Her behavior is normal.    No results found for this or any previous visit (from the past 24 hour(s)). Ct Head  Wo Contrast  Result Date: 11/20/2016 CLINICAL DATA:  71 y/o F; status post craniotomy for clipping of left posterior communicating and middle cerebral artery aneurysms. For EXAM: CT HEAD WITHOUT CONTRAST TECHNIQUE: Contiguous axial images were obtained from the base of the skull through the vertex without intravenous contrast. COMPARISON:  None. FINDINGS: Brain: No large territory infarct, brain parenchymal hemorrhage, or focal mass effect. No hydrocephalus. No effacement of basilar cisterns. Small extra-axial fluid collection over the frontal convexities and moderate pneumocephalus with anti dependent distribution compatible with postsurgical changes related to craniotomy. Vascular: Aneurysm clips are present near the left paraclinoid internal carotid artery and within the left lateral MCA cistern. Skull: Left frontotemporal craniotomy. Air and edema in the overlying scalp and scalp skin staples noted. Sinuses/Orbits: No acute finding. Other: Periapical cysts of maxillary incisors and canines compatible with odontogenic disease. IMPRESSION: 1. Expected postsurgical changes related to left frontotemporal craniotomy with air and  edema in the scalp, small bifrontal extra-axial fluid collections, and moderate pneumocephalus. 2. Aneurysm clips adjacent to left paraclinoid ICA an within left lateral MCA cistern. 3. No large territory infarct, brain parenchymal hemorrhage, or focal mass effect. 4. Periapical cysts of maxillary incisors and canines compatible with odontogenic disease. Electronically Signed   By: Kristine Garbe M.D.   On: 11/20/2016 05:06    Assessment/Plan: Diagnosis: Left MCA aneurysm and left posterior communicating artery aneurysm s/p clipping Labs and images independently reviewed.  Records reviewed and summated above.  1. Does the need for close, 24 hr/day medical supervision in concert with the patient's rehab needs make it unreasonable for this patient to be served in a less  intensive setting? Yes  2. Co-Morbidities requiring supervision/potential complications: HTN (monitor and provide prns in accordance with increased physical exertion and pain), fibromyalgia (Biofeedback training with therapies to help reduce reliance on opiate pain medications, monitor pain control during therapies, and sedation at rest and titrate to maximum efficacy to ensure participation and gains in therapies), seizure disorder (cont meds), ABLA (transfuse if necessary to ensure appropriate perfusion for increased activity tolerance), leukocytosis (cont to monitor for signs and symptoms of infection, further workup if indicated) 3. Due to safety, skin/wound care, disease management, medication administration, pain management and patient education, does the patient require 24 hr/day rehab nursing? Yes 4. Does the patient require coordinated care of a physician, rehab nurse, PT (1-2 hrs/day, 5 days/week) and OT (1-2 hrs/day, 5 days/week) to address physical and functional deficits in the context of the above medical diagnosis(es)? Yes Addressing deficits in the following areas: balance, endurance, locomotion, strength, transferring, bowel/bladder control, toileting and psychosocial support 5. Can the patient actively participate in an intensive therapy program of at least 3 hrs of therapy per day at least 5 days per week? Yes 6. The potential for patient to make measurable gains while on inpatient rehab is excellent 7. Anticipated functional outcomes upon discharge from inpatient rehab are modified independent and supervision  with PT, modified independent with OT, n/a with SLP. 8. Estimated rehab length of stay to reach the above functional goals is: 11-16 days. 9. Does the patient have adequate social supports and living environment to accommodate these discharge functional goals? Yes 10. Anticipated D/C setting: Home 11. Anticipated post D/C treatments: HH therapy and Home excercise  program 12. Overall Rehab/Functional Prognosis: good  RECOMMENDATIONS: This patient's condition is appropriate for continued rehabilitative care in the following setting: CIR Patient has agreed to participate in recommended program. Yes Note that insurance prior authorization may be required for reimbursement for recommended care.  Comment: Rehab Admissions Coordinator to follow up.  Bary Leriche, [place] 11/21/2016

## 2016-11-21 NOTE — Progress Notes (Signed)
No acute events Neuro intact except left third nerve palsy Incision looks good Will consult CIR

## 2016-11-21 NOTE — Progress Notes (Signed)
qPhysical Therapy Treatment Patient Details Name: Jennifer Jacobs MRN: [id number] DOB: 08/02/1946 Today's Date: 11/21/2016    History of Present Illness Patient is a 71 y/o female who transferred from [facility] with new onset headache, third nerve palsy and left posterior communicating artery aneurysm and MCA aneurysm now s/p craniotomy intracranial for aneurysm clipping. PMH includes HLD, HTN, MI, ETOH abusem seizure, depression.     PT Comments    Pt progressing well toward goals with increase in ambulation distance, however, pt continues to be limited by visual field deficits and ataxic gait. PTA pt was I living alone and is highly motivated to return home alone. Son very supportive. Pt would benefit from CIR to address impaired balance, coordination, strength and activity tolerance to maximize potential to return to PLOF. PT will continue to follow.    Follow Up Recommendations  CIR;Supervision/Assistance - 24 hour     Equipment Recommendations  Rolling walker with 5" wheels    Recommendations for Other Services       Precautions / Restrictions Precautions Precautions: Fall Precaution Comments: left eye ptosis Restrictions Weight Bearing Restrictions: No    Mobility  Bed Mobility Overal bed mobility: Needs Assistance Bed Mobility: Supine to Sit     Supine to sit: Supervision;HOB elevated     General bed mobility comments: pt initiated sitting toward side of bed with rail up and required verbal cues to sit EOB on side with rail down. no physical assist needed. Dizziness upon sitting upright which resolved. BP 116/73.  Transfers Overall transfer level: Needs assistance Equipment used: Rolling walker (2 wheeled) Transfers: Sit to/from Stand Sit to Stand: Min guard         General transfer comment: cues for hand placement and safety with RW. min guard for safety. no reports of dizziness  Ambulation/Gait Ambulation/Gait assistance: Min assist Ambulation Distance (Feet):  75 Feet Assistive device: Rolling walker (2 wheeled) Gait Pattern/deviations: Step-through pattern;Decreased stride length;Staggering right;Trunk flexed;Narrow base of support;Ataxic Gait velocity: decreased Gait velocity interpretation: Below normal speed for age/gender General Gait Details: verbal cues for turning and navigation of hallways due to visual field deficits. required increased time to navigate around obstalces on L due to visual field deficits. pt with ataxic uncoordinated ambulation with near crossover stepping pattern. pt with noted increased use of UE on RW. standing rest break x2 for 30 sec each   Stairs            Wheelchair Mobility    Modified Rankin (Stroke Patients Only) Modified Rankin (Stroke Patients Only) Pre-Morbid Rankin Score: No symptoms Modified Rankin: Moderately severe disability     Balance Overall balance assessment: Needs assistance Sitting-balance support: Feet supported;No upper extremity supported Sitting balance-Leahy Scale: Fair     Standing balance support: Bilateral upper extremity supported Standing balance-Leahy Scale: Poor Standing balance comment: reliant on RW and physical assist to prevent LOB                            Cognition Arousal/Alertness: Awake/alert Behavior During Therapy: WFL for tasks assessed/performed Overall Cognitive Status: Impaired/Different from baseline Area of Impairment: Memory;Problem solving                     Memory: Decreased short-term memory       Problem Solving: Difficulty sequencing;Requires verbal cues;Requires tactile cues General Comments: requires multimodal cues for safety and sequencing of transfers and ambulation      Exercises  General Comments        Pertinent Vitals/Pain Pain Assessment: 0-10 Pain Score: 2  Pain Location: headache Pain Descriptors / Indicators: Headache Pain Intervention(s): Limited activity within patient's  tolerance;Monitored during session;Repositioned    Home Living                      Prior Function            PT Goals (current goals can now be found in the care plan section) Acute Rehab PT Goals Patient Stated Goal: for eye to get better Progress towards PT goals: Progressing toward goals    Frequency    Min 4X/week      PT Plan Current plan remains appropriate    Co-evaluation             End of Session Equipment Utilized During Treatment: Gait belt Activity Tolerance: Patient tolerated treatment well Patient left: in chair;with call bell/phone within reach Nurse Communication: Mobility status PT Visit Diagnosis: Unsteadiness on feet (R26.81);Muscle weakness (generalized) (M62.81);Ataxic gait (R26.0) Pain - part of body:  (head)     Time: 9528-4132 PT Time Calculation (min) (ACUTE ONLY): 26 min  Charges:  $Gait Training: 23-37 mins                    G Codes:       Lane Hacker, SPT Acute Rehab SPT [phone]     Lane Hacker 11/21/2016, 9:40 AM

## 2016-11-21 NOTE — Progress Notes (Signed)
Pt admitted to the unit as a transfer from [place]. Pt A&O x4; MAE x4; pt oriented to the unit and room; fall/safety precaution and prevention education completed with pt and she voices understanding denies any questions. Pt left eye remains closed and edematous, ecchymotic; skin remains clean, dry and intact with no pressure wounds or open wounds noted except for left head surgical dsg with foam dsg clean, dry and intact with old marked drainage stain. VSS; pt resting comfortably in bed with call light within reach and family at bedside. Reported off to pt's RN Miranda. Dionne Bucy RN

## 2016-11-21 NOTE — Progress Notes (Signed)
Report given to [place], [place]. 2 tylenol #3 given for h/a. Pt wheeled to 5C12 by NT.

## 2016-11-21 NOTE — Telephone Encounter (Signed)
phone call from patient, she said she is in [facility], she had an anurysm, had surgery.   Just wanted [place] to know she is in hospital.

## 2016-11-21 NOTE — Telephone Encounter (Signed)
noted 

## 2016-11-22 ENCOUNTER — Inpatient Hospital Stay (HOSPITAL_COMMUNITY)
Admission: RE | Admit: 2016-11-22 | Discharge: 2016-11-27 | DRG: 057 | Disposition: A | Discharge status: Home / Self Care | Payer: Medicare Other | Source: Intra-hospital | Attending: Physical Medicine & Rehabilitation | Admitting: Physical Medicine & Rehabilitation

## 2016-11-22 ENCOUNTER — Encounter (HOSPITAL_COMMUNITY): Payer: Self-pay | Admitting: Physical Medicine & Rehabilitation

## 2016-11-22 DIAGNOSIS — H4902 Third [oculomotor] nerve palsy, left eye: Secondary | ICD-10-CM

## 2016-11-22 DIAGNOSIS — F329 Major depressive disorder, single episode, unspecified: Secondary | ICD-10-CM | POA: Diagnosis present

## 2016-11-22 DIAGNOSIS — Z298 Encounter for other specified prophylactic measures: Secondary | ICD-10-CM

## 2016-11-22 DIAGNOSIS — G8929 Other chronic pain: Secondary | ICD-10-CM | POA: Diagnosis present

## 2016-11-22 DIAGNOSIS — Z7982 Long term (current) use of aspirin: Secondary | ICD-10-CM

## 2016-11-22 DIAGNOSIS — I69319 Unspecified symptoms and signs involving cognitive functions following cerebral infarction: Secondary | ICD-10-CM | POA: Diagnosis not present

## 2016-11-22 DIAGNOSIS — F17210 Nicotine dependence, cigarettes, uncomplicated: Secondary | ICD-10-CM | POA: Diagnosis present

## 2016-11-22 DIAGNOSIS — D62 Acute posthemorrhagic anemia: Secondary | ICD-10-CM | POA: Diagnosis present

## 2016-11-22 DIAGNOSIS — Z888 Allergy status to other drugs, medicaments and biological substances status: Secondary | ICD-10-CM | POA: Diagnosis not present

## 2016-11-22 DIAGNOSIS — R269 Unspecified abnormalities of gait and mobility: Secondary | ICD-10-CM | POA: Diagnosis not present

## 2016-11-22 DIAGNOSIS — M797 Fibromyalgia: Secondary | ICD-10-CM | POA: Diagnosis present

## 2016-11-22 DIAGNOSIS — T380X5D Adverse effect of glucocorticoids and synthetic analogues, subsequent encounter: Secondary | ICD-10-CM

## 2016-11-22 DIAGNOSIS — I609 Nontraumatic subarachnoid hemorrhage, unspecified: Secondary | ICD-10-CM

## 2016-11-22 DIAGNOSIS — D72829 Elevated white blood cell count, unspecified: Secondary | ICD-10-CM | POA: Diagnosis present

## 2016-11-22 DIAGNOSIS — K5900 Constipation, unspecified: Secondary | ICD-10-CM | POA: Diagnosis present

## 2016-11-22 DIAGNOSIS — I69393 Ataxia following cerebral infarction: Principal | ICD-10-CM

## 2016-11-22 DIAGNOSIS — Z79899 Other long term (current) drug therapy: Secondary | ICD-10-CM

## 2016-11-22 DIAGNOSIS — I671 Cerebral aneurysm, nonruptured: Secondary | ICD-10-CM | POA: Diagnosis not present

## 2016-11-22 DIAGNOSIS — M549 Dorsalgia, unspecified: Secondary | ICD-10-CM | POA: Diagnosis present

## 2016-11-22 DIAGNOSIS — I69398 Other sequelae of cerebral infarction: Secondary | ICD-10-CM

## 2016-11-22 DIAGNOSIS — I10 Essential (primary) hypertension: Secondary | ICD-10-CM | POA: Diagnosis present

## 2016-11-22 DIAGNOSIS — G40909 Epilepsy, unspecified, not intractable, without status epilepticus: Secondary | ICD-10-CM | POA: Diagnosis present

## 2016-11-22 DIAGNOSIS — G441 Vascular headache, not elsewhere classified: Secondary | ICD-10-CM | POA: Diagnosis not present

## 2016-11-22 DIAGNOSIS — Z2989 Encounter for other specified prophylactic measures: Secondary | ICD-10-CM

## 2016-11-22 HISTORY — DX: Unspecified symptoms and signs involving cognitive functions following cerebral infarction: I69.319

## 2016-11-22 LAB — BPAM RBC
Blood Product Expiration Date: 201804282359
Blood Product Expiration Date: 201804282359
ISSUE DATE / TIME: 201804020941
ISSUE DATE / TIME: 201804020941
UNIT TYPE AND RH: 5100
Unit Type and Rh: 5100

## 2016-11-22 LAB — TYPE AND SCREEN
ABO/RH(D): O POS
ANTIBODY SCREEN: NEGATIVE
UNIT DIVISION: 0
Unit division: 0

## 2016-11-22 MED ORDER — LEVETIRACETAM 500 MG PO TABS: 500.0000 mg | ORAL_TABLET | Freq: Two times a day (BID) | ORAL | 2 refills | Status: DC

## 2016-11-22 MED ORDER — DOCUSATE SODIUM 100 MG PO CAPS: 100.0000 mg | ORAL_CAPSULE | Freq: Every day | ORAL | Status: DC | PRN

## 2016-11-22 MED ORDER — PROCHLORPERAZINE EDISYLATE 5 MG/ML IJ SOLN: 5.0000 mg | Freq: Four times a day (QID) | INTRAMUSCULAR | Status: DC | PRN

## 2016-11-22 MED ORDER — ALUM & MAG HYDROXIDE-SIMETH 200-200-20 MG/5ML PO SUSP: 30.0000 mL | ORAL | Status: DC | PRN

## 2016-11-22 MED ORDER — METHOCARBAMOL 500 MG PO TABS: 500.0000 mg | ORAL_TABLET | Freq: Three times a day (TID) | ORAL | 2 refills | Status: DC

## 2016-11-22 MED ORDER — DEXAMETHASONE 4 MG PO TABS
4.0000 mg | ORAL_TABLET | Freq: Three times a day (TID) | ORAL | Status: DC
  Administered 2016-11-22 – 2016-11-27 (×14): 4 mg via ORAL
  Filled 2016-11-22 (×14): qty 1

## 2016-11-22 MED ORDER — TRAZODONE HCL 50 MG PO TABS
150.0000 mg | ORAL_TABLET | Freq: Every day | ORAL | Status: DC
  Administered 2016-11-22 – 2016-11-26 (×5): 150 mg via ORAL
  Filled 2016-11-22 (×5): qty 3

## 2016-11-22 MED ORDER — LISINOPRIL 20 MG PO TABS
20.0000 mg | ORAL_TABLET | Freq: Every day | ORAL | Status: DC
  Administered 2016-11-23: 20 mg via ORAL
  Filled 2016-11-22: qty 1

## 2016-11-22 MED ORDER — ADULT MULTIVITAMIN W/MINERALS CH
1.0000 | ORAL_TABLET | Freq: Every day | ORAL | Status: DC
  Administered 2016-11-23 – 2016-11-27 (×5): 1 via ORAL
  Filled 2016-11-22 (×5): qty 1

## 2016-11-22 MED ORDER — POTASSIUM CHLORIDE CRYS ER 20 MEQ PO TBCR
20.0000 meq | EXTENDED_RELEASE_TABLET | Freq: Two times a day (BID) | ORAL | Status: DC
  Administered 2016-11-22 – 2016-11-27 (×10): 20 meq via ORAL
  Filled 2016-11-22 (×10): qty 1

## 2016-11-22 MED ORDER — HYDROCHLOROTHIAZIDE 25 MG PO TABS
25.0000 mg | ORAL_TABLET | Freq: Every day | ORAL | Status: DC
  Administered 2016-11-23 – 2016-11-27 (×5): 25 mg via ORAL
  Filled 2016-11-22 (×5): qty 1

## 2016-11-22 MED ORDER — HYDROCODONE-ACETAMINOPHEN 5-325 MG PO TABS
1.0000 | ORAL_TABLET | ORAL | Status: DC | PRN
  Administered 2016-11-23 – 2016-11-27 (×15): 1 via ORAL
  Filled 2016-11-22 (×15): qty 1

## 2016-11-22 MED ORDER — LISINOPRIL-HYDROCHLOROTHIAZIDE 20-25 MG PO TABS: 1.0000 | ORAL_TABLET | Freq: Every day | ORAL | Status: DC

## 2016-11-22 MED ORDER — BISACODYL 10 MG RE SUPP: 10.0000 mg | Freq: Every day | RECTAL | Status: DC | PRN

## 2016-11-22 MED ORDER — PROCHLORPERAZINE MALEATE 5 MG PO TABS: 5.0000 mg | ORAL_TABLET | Freq: Four times a day (QID) | ORAL | Status: DC | PRN

## 2016-11-22 MED ORDER — FLEET ENEMA 7-19 GM/118ML RE ENEM: 1.0000 | ENEMA | Freq: Once | RECTAL | Status: DC | PRN

## 2016-11-22 MED ORDER — LEVETIRACETAM 500 MG PO TABS
500.0000 mg | ORAL_TABLET | Freq: Two times a day (BID) | ORAL | Status: DC
  Administered 2016-11-22 – 2016-11-27 (×10): 500 mg via ORAL
  Filled 2016-11-22 (×10): qty 1

## 2016-11-22 MED ORDER — HYDROCODONE-ACETAMINOPHEN 5-325 MG PO TABS: 1.0000 | ORAL_TABLET | ORAL | 0 refills | Status: DC | PRN

## 2016-11-22 MED ORDER — METHOCARBAMOL 500 MG PO TABS
500.0000 mg | ORAL_TABLET | Freq: Three times a day (TID) | ORAL | Status: DC
  Administered 2016-11-22 – 2016-11-27 (×15): 500 mg via ORAL
  Filled 2016-11-22 (×15): qty 1

## 2016-11-22 MED ORDER — GABAPENTIN 300 MG PO CAPS
600.0000 mg | ORAL_CAPSULE | Freq: Three times a day (TID) | ORAL | Status: DC
  Administered 2016-11-22 – 2016-11-27 (×15): 600 mg via ORAL
  Filled 2016-11-22 (×15): qty 2

## 2016-11-22 MED ORDER — LAMOTRIGINE 25 MG PO TABS
100.0000 mg | ORAL_TABLET | Freq: Two times a day (BID) | ORAL | Status: DC
  Administered 2016-11-22 – 2016-11-27 (×10): 100 mg via ORAL
  Filled 2016-11-22 (×10): qty 4

## 2016-11-22 MED ORDER — SENNOSIDES-DOCUSATE SODIUM 8.6-50 MG PO TABS
2.0000 | ORAL_TABLET | Freq: Every day | ORAL | Status: DC
  Administered 2016-11-22 – 2016-11-25 (×4): 2 via ORAL
  Filled 2016-11-22 (×4): qty 2

## 2016-11-22 MED ORDER — ESCITALOPRAM OXALATE 10 MG PO TABS
20.0000 mg | ORAL_TABLET | Freq: Two times a day (BID) | ORAL | Status: DC
  Administered 2016-11-22 – 2016-11-27 (×10): 20 mg via ORAL
  Filled 2016-11-22 (×10): qty 2

## 2016-11-22 MED ORDER — PANTOPRAZOLE SODIUM 40 MG PO TBEC
40.0000 mg | DELAYED_RELEASE_TABLET | Freq: Every day | ORAL | Status: DC
  Administered 2016-11-23 – 2016-11-27 (×5): 40 mg via ORAL
  Filled 2016-11-22 (×5): qty 1

## 2016-11-22 MED ORDER — POLYETHYLENE GLYCOL 3350 17 G PO PACK: 17.0000 g | PACK | Freq: Every day | ORAL | Status: DC | PRN

## 2016-11-22 MED ORDER — PROCHLORPERAZINE 25 MG RE SUPP: 12.5000 mg | Freq: Four times a day (QID) | RECTAL | Status: DC | PRN

## 2016-11-22 MED ORDER — ACETAMINOPHEN 325 MG PO TABS
325.0000 mg | ORAL_TABLET | ORAL | Status: DC | PRN
  Administered 2016-11-23 – 2016-11-24 (×2): 650 mg via ORAL
  Filled 2016-11-22 (×2): qty 2

## 2016-11-22 MED ORDER — METOPROLOL TARTRATE 25 MG PO TABS
25.0000 mg | ORAL_TABLET | Freq: Two times a day (BID) | ORAL | Status: DC
  Administered 2016-11-22 – 2016-11-27 (×10): 25 mg via ORAL
  Filled 2016-11-22 (×10): qty 1

## 2016-11-22 MED ORDER — DIPHENHYDRAMINE HCL 12.5 MG/5ML PO ELIX: 12.5000 mg | ORAL_SOLUTION | Freq: Four times a day (QID) | ORAL | Status: DC | PRN

## 2016-11-22 MED ORDER — GUAIFENESIN-DM 100-10 MG/5ML PO SYRP: 5.0000 mL | ORAL_SOLUTION | Freq: Four times a day (QID) | ORAL | Status: DC | PRN

## 2016-11-22 NOTE — H&P (Addendum)
Physical Medicine and Rehabilitation Admission H&P    Chief Complaint  Patient presents with  . L-PCA and MCA aneurysms     HPI:  Jennifer Jacobs is a 71 y.o. female with history of HTN, fibromyalgia, seizure disorder who was admitted via [facility] 11/17/16 with acute onset of headache, photophobia and blurry vision progressing BLE weakness and left CN III palsy. She was found to have left MCA aneurysm and left posterior communicating artery aneurysm. She underwent left crani for aneurysm clipping by Dr. Cyndy Freeze. Post op had agitation due to morphine. CT head 4/3 with expected surgical changes and  showing aneurysm clips.  Therapy ongoing and patient limited by decrease in balance with impulsivity, STM deficits and decreased awareness of deficits with poor safety.  CIR recommended for follow up therapy.    Review of Systems  HENT: Positive for hearing loss. Negative for tinnitus.   Eyes: Positive for blurred vision (needs new glasses). Negative for photophobia.  Respiratory: Negative for cough and shortness of breath.   Cardiovascular: Negative for chest pain and leg swelling.  Gastrointestinal: Negative for constipation, heartburn and nausea.  Genitourinary: Positive for frequency. Negative for dysuria and urgency.       Occasional incontinence  Musculoskeletal: Positive for back pain and myalgias.  Neurological: Positive for headaches. Negative for dizziness, sensory change and focal weakness.  Psychiatric/Behavioral: Negative for depression (controlled) and memory loss. The patient is not nervous/anxious.     Past Medical History:  Diagnosis Date  . Acute blood loss anemia   . Allergy   . Anxiety   . Anxiety and depression   . Arthritis   . Cognitive deficit due to recent stroke 11/22/2016  . Depression   . Dyslipidemia   . Fibromyalgia   . Hepatic cyst   . Hyperlipidemia   . Hypertension   . MI (myocardial infarction) 2007   By patient report, not substantiated by recent  nuclear stress test.  . Neuromuscular disorder ([place])   . Seizure disorder ([place])    none since 2002  . Seizures ([place])   . Smoker unmotivated to quit    Quit for 3 years and then restarted  . Substance abuse    ETOH  . Vision abnormalities     Past Surgical History:  Procedure Laterality Date  . CRANIOTOMY Left 11/19/2016   Procedure: CRANIOTOMY INTRACRANIAL FOR  ANEURYSM CLIPPING;  Surgeon: Kevan Ny Ditty, MD;  Location: [facility];  Service: Neurosurgery;  Laterality: Left;  . ELBOW ARTHROPLASTY Left    rods  . Exercise tolerance test  04/27/2013   CPET-MET: Submaximal effort (0.96, with goal of greater than 1.09) -- patient states that her effort was limited due to knee pain.  This makes the rest of the interpretation difficult: Peak VO2 - 10.5 (59% moderate), peak 02-pulse -  7..11 (low); heart rate 114 (73% -chronic incompetence considered);; PFTs normal   . MOUTH SURGERY    . NM MYOVIEW LTD  05/19/2013   LexiScan: EF 80%, no evidence of ischemia or infarction  . TRANSTHORACIC ECHOCARDIOGRAM  03/25/2013   EF 55-60%, no regional wall motion abnormalities; normal diastolic parameters, normal pulmonary pressures.  No valvular lesions noted.  Essentially normal echo     Family History  Problem Relation Age of Onset  . Heart attack Father 22  . Hypertension Father   . Alcohol abuse Father   . Heart attack Maternal Grandmother   . Cancer Maternal Grandfather     type unknown  .  Cancer Paternal Grandmother     type unknown  . Depression Mother   . Alcohol abuse Mother   . Ovarian cancer Paternal Aunt   . Cancer Paternal Aunt     ovarian  . Prostate cancer Paternal Uncle   . Stomach cancer Paternal Aunt   . Diabetes Daughter   . Heart disease Daughter 33    heart attack  . Depression Sister   . Alcohol abuse Brother   . Depression Sister   . Alcohol abuse Sister   . Depression Sister   . Alcohol abuse Sister   . Alcohol abuse Brother   . Alcohol abuse Son     Social  History:  Married but separated. Works part time as an Psychologist, clinical. Plans on going home with son. She reports that she quit smoking 2-3 years ago.  She has been smoking on and off since age 21.  She has never used smokeless tobacco. She reports that she quit using Marijuana and alcohol three years ago.     Allergies  Allergen Reactions  . Prozac [Fluoxetine Hcl] Other (See Comments)    Headaches    Medications Prior to Admission  Medication Sig Dispense Refill  . aspirin EC 81 MG tablet Take 81 mg by mouth daily.    Marland Kitchen docusate sodium (COLACE) 100 MG capsule Take 100 mg by mouth daily as needed for mild constipation (constipation).     Marland Kitchen escitalopram (LEXAPRO) 20 MG tablet Take 1 tablet (20 mg total) by mouth 2 (two) times daily. 60 tablet 2  . gabapentin (NEURONTIN) 300 MG capsule Take 2 capsules (600 mg total) by mouth 3 (three) times daily. 180 capsule 5  . HYDROcodone-acetaminophen (NORCO/VICODIN) 5-325 MG tablet Take 1 tablet by mouth every 4 (four) hours as needed for moderate pain. 30 tablet 0  . lamoTRIgine (LAMICTAL) 100 MG tablet Take 1 tablet (100 mg total) by mouth 2 (two) times daily. 60 tablet 2  . levETIRAcetam (KEPPRA) 500 MG tablet Take 1 tablet (500 mg total) by mouth 2 (two) times daily. 60 tablet 2  . lisinopril-hydrochlorothiazide (PRINZIDE,ZESTORETIC) 20-25 MG tablet Take 1 tablet by mouth daily. 90 tablet 3  . methocarbamol (ROBAXIN) 500 MG tablet Take 1 tablet (500 mg total) by mouth 3 (three) times daily. 90 tablet 2  . metoprolol tartrate (LOPRESSOR) 25 MG tablet Take 1 tablet (25 mg total) by mouth 2 (two) times daily. 180 tablet 3  . Multiple Vitamins-Minerals (CENTRUM SILVER ADULT 50+ PO) Take 1 tablet by mouth daily.    . naproxen sodium (ANAPROX) 220 MG tablet Take 220-440 mg by mouth 2 (two) times daily as needed (for pain).    . traZODone (DESYREL) 150 MG tablet Take 1 tablet (150 mg total) by mouth at bedtime. 30 tablet 2    Home:     Functional History:     Functional Status:  Mobility:          ADL:    Cognition:       Blood pressure 120/82, pulse 71, temperature 98.7 F (37.1 C), temperature source Oral, resp. rate 17, height '5\' 8"'  (1.727 m), weight 79.3 kg (174 lb 12.8 oz), SpO2 97 %. Physical Exam  Constitutional: She is oriented to person, place, and time. She appears well-developed and well-nourished.  HENT:  Head: Normocephalic and atraumatic.  Eyes: Conjunctivae and EOM are normal. Pupils are equal, round, and reactive to light.  Neck: Normal range of motion.  Cardiovascular: Normal rate, regular rhythm and normal heart  sounds.   Respiratory: Effort normal and breath sounds normal.  GI: Soft. Bowel sounds are normal. She exhibits no distension. There is no tenderness.  Neurological: She is alert and oriented to person, place, and time. She exhibits abnormal muscle tone. Gait abnormal.  Motor 5-/5 in the right deltoid, biceps, triceps, grip, hip flexor, knee extensor, ankle dorsiflexor 5/5 on the left side.   Skin: Skin is warm and dry.  Psychiatric: She has a normal mood and affect. Her speech is normal. She is not agitated, not slowed and not withdrawn. She expresses impulsivity.    No results found for this or any previous visit (from the past 48 hour(s)). No results found.     Medical Problem List and Plan: 1.  Gait disorder and cognitive deficits due to Left MCA and PCA aneurysm 2.  DVT Prophylaxis/Anticoagulation: Mechanical: Sequential compression devices, below knee Bilateral lower extremities 3. Pain Management: Tylenol for HA 4. Mood: SW to monitor, support from team 5. Neuropsych: This patient is capable of making decisions on her own behalf. 6. Skin/Wound Care: Dry dressing to Left crani incision 7. Fluids/Electrolytes/Nutrition: monitor I/O , check labs in am 8. Seizure prophylaxis: On Keppra bid 9. HTN: Monitor BP bid--continue lisinopril, HCTZ and metoprolol 10. Chronic back pain: Has some  relief with Neurontin. 11. H/o depression: Mood stable on Lexapro and trazodone     Post Admission Physician Evaluation: 1. Functional deficits after  aneurysm clipping 2. Patient is admitted to receive collaborative, interdisciplinary care between the physiatrist, rehab nursing staff, and therapy team. 3. Patient's level of medical complexity and substantial therapy needs in context of that medical necessity cannot be provided at a lesser intensity of care such as a SNF. 4. Patient has experienced substantial functional loss from his/her baseline which was documented above under the "Functional History" and "Functional Status" headings.  Judging by the patient's diagnosis, physical exam, and functional history, the patient has potential for functional progress which will result in measurable gains while on inpatient rehab.  These gains will be of substantial and practical use upon discharge  in facilitating mobility and self-care at the household level. 5. Physiatrist will provide 24 hour management of medical needs as well as oversight of the therapy plan/treatment and provide guidance as appropriate regarding the interaction of the two. 6. The Preadmission Screening has been reviewed and patient status is unchanged unless otherwise stated above. 7. 24 hour rehab nursing will assist with bladder management, bowel management, safety, skin/wound care, disease management, medication administration, pain management and patient education  and help integrate therapy concepts, techniques,education, etc. 8. PT will assess and treat for/with: pre gait, gait training, endurance , safety, equipment, neuromuscular re education.   Goals are: Mod I. 9. OT will assess and treat for/with: ADLs, Cognitive perceptual skills, Neuromuscular re education, safety, endurance, equipment.   Goals are: Mod I. Therapy may proceed with showering this patient. 10. SLP will assess and treat for/with: eval cognition, .  Goals  are: Mod I med management. 11. Case Management and Social Worker will assess and treat for psychological issues and discharge planning. 12. Team conference will be held weekly to assess progress toward goals and to determine barriers to discharge. 13. Patient will receive at least 3 hours of therapy per day at least 5 days per week. 14. ELOS: 7-10d       15. Prognosis:  excellent     PAM LOVE PAC Charlett Blake, MD 11/22/2016

## 2016-11-22 NOTE — Progress Notes (Signed)
qPhysical Therapy Treatment Patient Details Name: Jennifer Jacobs MRN: [id number] DOB: 02/11/1946 Today's Date: 11/22/2016    History of Present Illness Patient is a 71 y/o female who transferred from [facility] with new onset headache, third nerve palsy and left posterior communicating artery aneurysm and MCA aneurysm now s/p craniotomy intracranial for aneurysm clipping. PMH includes HLD, HTN, MI, ETOH abusem seizure, depression.     PT Comments    Pt progressing towards physical therapy goals. Was able to perform ambulation with and without RW for support. Mod assist was required for balance support without RW use. Pt continues to require redirection for attention to task and cues for safety awareness. Recommendation for CIR remains appropriate. Will continue to follow.   Follow Up Recommendations  CIR;Supervision/Assistance - 24 hour     Equipment Recommendations  Rolling walker with 5" wheels    Recommendations for Other Services       Precautions / Restrictions Precautions Precautions: Fall Restrictions Weight Bearing Restrictions: No    Mobility  Bed Mobility Overal bed mobility: Needs Assistance Bed Mobility: Supine to Sit     Supine to sit: Supervision;HOB elevated     General bed mobility comments: Pt was able to transition to EOB without assistance. VC's for general safety. HOB elevated.   Transfers Overall transfer level: Needs assistance Equipment used: Rolling walker (2 wheeled) Transfers: Sit to/from Stand Sit to Stand: Min guard         General transfer comment: VC's for hand placement on seated surface for safety. Pt with increased time to process where to put hands after cued.   Ambulation/Gait Ambulation/Gait assistance: Min assist;Mod assist Ambulation Distance (Feet): 200 Feet Assistive device: Rolling walker (2 wheeled);None Gait Pattern/deviations: Step-through pattern;Decreased stride length;Staggering right;Trunk flexed;Narrow base of  support;Ataxic Gait velocity: decreased Gait velocity interpretation: Below normal speed for age/gender General Gait Details: Pt ambulated without RW for last 1/2 of gait training. Up to mod assist required to maintain balance at times. With RW, occasional min assist required. Frequent VC's required for safety as pt impulsively letting go of walker and requiring increased assist.    Stairs            Wheelchair Mobility    Modified Rankin (Stroke Patients Only) Modified Rankin (Stroke Patients Only) Pre-Morbid Rankin Score: No symptoms Modified Rankin: Moderately severe disability     Balance Overall balance assessment: Needs assistance Sitting-balance support: Feet supported;No upper extremity supported Sitting balance-Leahy Scale: Fair     Standing balance support: No upper extremity supported Standing balance-Leahy Scale: Poor Standing balance comment: Requires assistance              High level balance activites: Backward walking;Direction changes;Turns;Sudden stops;Head turns;Other (comment) (Stepping over an object) High Level Balance Comments: Pt required increased time for preparation when stepping over an object. Gait speed significantly decreased with head turns and assist was required.             Cognition Arousal/Alertness: Awake/alert Behavior During Therapy: Impulsive Overall Cognitive Status: Impaired/Different from baseline Area of Impairment: Memory;Problem solving;Safety/judgement                   Current Attention Level: Selective Memory: Decreased short-term memory;Decreased recall of precautions   Safety/Judgement: Decreased awareness of safety;Decreased awareness of deficits   Problem Solving: Slow processing General Comments: requires multimodal cues for safety and sequencing of transfers and ambulation      Exercises      General Comments  Pertinent Vitals/Pain Pain Assessment: Faces Faces Pain Scale: Hurts a  little bit Pain Location: R UE where they had taken IV out Pain Descriptors / Indicators: Discomfort Pain Intervention(s): Monitored during session    Home Living                      Prior Function            PT Goals (current goals can now be found in the care plan section) Acute Rehab PT Goals Patient Stated Goal: for eye to get better PT Goal Formulation: With patient Time For Goal Achievement: 12/04/16 Potential to Achieve Goals: Good Progress towards PT goals: Progressing toward goals    Frequency    Min 4X/week      PT Plan Current plan remains appropriate    Co-evaluation             End of Session Equipment Utilized During Treatment: Gait belt Activity Tolerance: Patient tolerated treatment well Patient left: in chair;with call bell/phone within reach;with chair alarm set Nurse Communication: Mobility status PT Visit Diagnosis: Unsteadiness on feet (R26.81);Muscle weakness (generalized) (M62.81);Ataxic gait (R26.0)     Time: 1478-2956 PT Time Calculation (min) (ACUTE ONLY): 23 min  Charges:  $Gait Training: 23-37 mins                    G Codes:       Conni Slipper, PT, DPT Acute Rehabilitation Services Pager: [phone]    Marylynn Pearson 11/22/2016, 9:54 AM

## 2016-11-22 NOTE — Progress Notes (Signed)
Received pt. As a transfer from 5 C.Pt. And her family were oriented to the unit protocol and routine.Safety plan was explained,fall prevention plan was explained and signed by the RN and pt's son.

## 2016-11-22 NOTE — Care Management Important Message (Signed)
Important Message  Patient Details  Name: Jennifer Jacobs MRN: [id number] Date of Birth: 1945/10/11   Medicare Important Message Given:  Yes    Tallen Schnorr Stefan Church 11/22/2016, 11:34 AM

## 2016-11-22 NOTE — Progress Notes (Signed)
Trish Mage, RN Rehab Admission Coordinator Signed Physical Medicine and Rehabilitation  PMR Pre-admission Date of Service: 11/22/2016 1:33 PM  Related encounter: ED to Hosp-Admission (Discharged) from 11/17/2016 in [facility]       Hide copied text PMR Admission Coordinator Pre-Admission Assessment  Patient: Jennifer STRICKER is an 71 y.o., female MRN: [id number] DOB: 05-18-1946 Height:  (172.7 cm) Weight: 81.6 kg (180 lb)                                                                                                                                                  Insurance Information HMO: No   PPO:       PCP:       IPA:       80/20:       OTHER:   PRIMARY:  Medicare A/B      Policy#: [id number]      Subscriber:  Weston Anna CM Name:        Phone#:       Fax#:   Pre-Cert#:        Employer:  Works PT Benefits:  Phone #:       Name:  Checked in [employer]. Date: 06/20/13     Deduct: $1340      Out of Pocket Max:  None      Life Max: unlimited CIR: 100%      SNF: 100 days Outpatient: 80%     Co-Pay: 20% Home Health: 100%      Co-Pay: none                       DME: 80%     Co-Pay: 20% Providers: patient's choice  Emergency Contact Information        Contact Information    Name Relation Home Work Mobile   Cummings,Walter Son [phone]     Carver Fila [phone]  [phone]     Current Medical History  Patient Admitting Diagnosis: Left MCA aneurysm and left posterior communicating artery aneurysm s/p clipping   History of Present Illness:A 71 y.o.femalewith history of HTN, fibromyalgia, seizure disorder who was admitted via [facility] 11/17/16 with acute onset of headache, photophobia and blurry vision progressing BLE weakness and left CN III palsy. She was found to have left MCA aneurysm and left posterior communicating artery aneurysm. She underwent left crani for aneurysm clipping by Dr. Bevely Palmer. Post op  with agitation due to morphine. CT head 4/3 reviewed, showing aneurysm clips. PT/OT evaluations done on 4/2 revealing decrease in balance, coordination, ataxia, difficulty with ADL tasks and staggering gait. CIR recommended for follow up therapy.    Total: 3=NIH  Past Medical History      Past Medical History:  Diagnosis Date  . Acute blood loss anemia   .  Allergy   . Anxiety   . Anxiety and depression   . Arthritis   . Depression   . Dyslipidemia   . Fibromyalgia   . Hepatic cyst   . Hyperlipidemia   . Hypertension   . MI (myocardial infarction) 2007   By patient report, not substantiated by recent nuclear stress test.  . Neuromuscular disorder (HCC)   . Seizure disorder (HCC)    none since 2002  . Seizures (HCC)   . Smoker unmotivated to quit    Quit for 3 years and then restarted  . Substance abuse    ETOH  . Vision abnormalities     Family History  family history includes Alcohol abuse in her brother, brother, father, mother, sister, sister, and son; Cancer in her maternal grandfather, paternal aunt, and paternal grandmother; Depression in her mother, sister, sister, and sister; Diabetes in her daughter; Heart attack in her maternal grandmother; Heart attack (age of onset: 33) in her father; Heart disease (age of onset: 60) in her daughter; Hypertension in her father; Ovarian cancer in her paternal aunt; Prostate cancer in her paternal uncle; Stomach cancer in her paternal aunt.  Prior Rehab/Hospitalizations: No previous rehab admissions  Has the patient had major surgery during 100 days prior to admission? No  Current Medications   Current Facility-Administered Medications:  .   stroke: mapping our early stages of recovery book, , Does not apply, Once, Maeola Harman, MD .  0.9 %  sodium chloride infusion, , Intravenous, Once, Loura Halt Ditty, MD .  0.9 %  sodium chloride infusion, , Intravenous, Continuous, Loura Halt Ditty, MD,  Stopped at 11/20/16 1230 .  acetaminophen (TYLENOL) tablet 650 mg, 650 mg, Oral, Q4H PRN, 650 mg at 11/20/16 0342 **OR** acetaminophen (TYLENOL) solution 650 mg, 650 mg, Per Tube, Q4H PRN **OR** acetaminophen (TYLENOL) suppository 650 mg, 650 mg, Rectal, Q4H PRN, Maeola Harman, MD .  acetaminophen-codeine (TYLENOL #3) 300-30 MG per tablet 1-2 tablet, 1-2 tablet, Oral, Q4H PRN, Maeola Harman, MD, 2 tablet at 11/22/16 1308 .  aspirin EC tablet 81 mg, 81 mg, Oral, Daily, Maeola Harman, MD, 81 mg at 11/22/16 1024 .  bisacodyl (DULCOLAX) EC tablet 5 mg, 5 mg, Oral, Daily PRN, Loura Halt Ditty, MD .  [address] dexamethasone (DECADRON) injection 6 mg, 6 mg, Intravenous, Q6H, 6 mg at 11/20/16 1255 **FOLLOWED BY** [COMPLETED] dexamethasone (DECADRON) injection 4 mg, 4 mg, Intravenous, Q6H, 4 mg at 11/21/16 1252 **FOLLOWED BY** dexamethasone (DECADRON) injection 4 mg, 4 mg, Intravenous, Q8H, Loura Halt Ditty, MD, 4 mg at 11/22/16 1307 .  docusate sodium (COLACE) capsule 100 mg, 100 mg, Oral, BID, Loura Halt Ditty, MD, 100 mg at 11/22/16 1024 .  escitalopram (LEXAPRO) tablet 20 mg, 20 mg, Oral, BID, Maeola Harman, MD, 20 mg at 11/22/16 1024 .  gabapentin (NEURONTIN) capsule 600 mg, 600 mg, Oral, TID, Loura Halt Ditty, MD, 600 mg at 11/22/16 1024 .  hydrALAZINE (APRESOLINE) injection 5-10 mg, 5-10 mg, Intravenous, Q1H PRN, Loura Halt Ditty, MD .  hydrochlorothiazide (HYDRODIURIL) tablet 25 mg, 25 mg, Oral, Daily, Maeola Harman, MD, 25 mg at 11/22/16 1024 .  HYDROcodone-acetaminophen (NORCO/VICODIN) 5-325 MG per tablet 1 tablet, 1 tablet, Oral, Q4H PRN, Loura Halt Ditty, MD, 1 tablet at 11/21/16 2141 .  HYDROmorphone (DILAUDID) injection 0.5-1 mg, 0.5-1 mg, Intravenous, Q2H PRN, Loura Halt Ditty, MD, 1 mg at 11/20/16 1458 .  lamoTRIgine (LAMICTAL) tablet 100 mg, 100 mg, Oral, BID, Maeola Harman, MD, 100 mg at 11/22/16 1024 .  levETIRAcetam (KEPPRA) tablet 500 mg, 500 mg, Oral, BID, Loura Halt Ditty, MD, 500 mg at 11/22/16 1024 .  lisinopril (PRINIVIL,ZESTRIL) tablet 20 mg, 20 mg, Oral, Daily, Maeola Harman, MD, 20 mg at 11/22/16 1024 .  metoprolol tartrate (LOPRESSOR) tablet 25 mg, 25 mg, Oral, BID, Maeola Harman, MD, 25 mg at 11/22/16 1024 .  morphine 2 MG/ML injection 1-4 mg, 1-4 mg, Intravenous, Q1H PRN, Maeola Harman, MD, 2 mg at 11/20/16 0314 .  naloxone [facility]) injection 0.08 mg, 0.08 mg, Intravenous, PRN, Loura Halt Ditty, MD .  ondansetron [facility]) tablet 4 mg, 4 mg, Oral, Q4H PRN **OR** ondansetron (ZOFRAN) injection 4 mg, 4 mg, Intravenous, Q4H PRN, Loura Halt Ditty, MD .  pantoprazole (PROTONIX) EC tablet 40 mg, 40 mg, Oral, QHS, Loura Halt Ditty, MD, 40 mg at 11/21/16 2140 .  potassium chloride SA (K-DUR,KLOR-CON) CR tablet 20 mEq, 20 mEq, Oral, BID, Loura Halt Ditty, MD, 20 mEq at 11/22/16 1024 .  promethazine (PHENERGAN) tablet 12.5-25 mg, 12.5-25 mg, Oral, Q4H PRN, Loura Halt Ditty, MD .  [facility]) tablet 8.6 mg, 1 tablet, Oral, BID, Loura Halt Ditty, MD, 8.6 mg at 11/22/16 1024 .  sodium phosphate (FLEET) 7-19 GM/118ML enema 1 enema, 1 enema, Rectal, Once PRN, Loura Halt Ditty, MD .  traZODone (DESYREL) tablet 150 mg, 150 mg, Oral, QHS, Maeola Harman, MD, 150 mg at 11/21/16 2141  Patients Current Diet: Diet regular Room service appropriate? Yes; Fluid consistency: Thin  Precautions / Restrictions Precautions Precautions: Fall Precaution Comments: left eye ptosis Restrictions Weight Bearing Restrictions: No   Has the patient had 2 or more falls or a fall with injury in the past year?YesHas had 7-10 falls with injury to ankle, face.  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  Home Assistive Devices / Equipment Home Assistive Devices/Equipment: None Home Equipment: Cane - single point  Prior Device Use: Indicate devices/aids used by the patient prior to  current illness, exacerbation or injury? Walker and [employer]  Prior Functional Level Prior Function Level of Independence: Independent with assistive device(s) Comments: Uses [employer] P[employer]. Drives. Works as [employer]   Self Care: Did the patient need help bathing, dressing, using the toilet or eating?  Independent  Indoor Mobility: Did the patient need assistance with walking from room to room (with or without device)? Independent  Stairs: Did the patient need assistance with internal or external stairs (with or without device)? Independent  Functional Cognition: Did the patient need help planning regular tasks such as shopping or remembering to take medications? Independent  Current Functional Level Cognition  Arousal/Alertness: Awake/alert Overall Cognitive Status: Impaired/Different from baseline Current Attention Level: Selective Orientation Level: Oriented X4 Safety/Judgement: Decreased awareness of safety, Decreased awareness of deficits General Comments: requires multimodal cues for safety and sequencing of transfers and ambulation    Extremity Assessment (includes Sensation/Coordination)  Upper Extremity Assessment: RUE deficits/detail, LUE deficits/detail, Generalized weakness RUE Deficits / Details: tremort noted bil. UEs that she reports is new since surgery  RUE Coordination: decreased fine motor LUE Deficits / Details: tremort noted bil. UEs that she reports is new since surgery LUE Coordination: decreased fine motor  Lower Extremity Assessment: Defer to PT evaluation RLE Deficits / Details: Grossly ~4/5 throughout. RLE Sensation:  (WFL) LLE Deficits / Details: Grossly ~3/5 throughout, instability noted during functional tasks. LLE Sensation:  [facility])    ADLs  Overall ADL's : Needs assistance/impaired Eating/Feeding: Set up, Bed level Grooming: Wash/dry  hands, Wash/dry face, Oral care, Set up, Supervision/safety, Bed level Upper Body Bathing: Minimal assistance, Bed  level, Sitting Lower Body Bathing: Moderate assistance, Sit to/from stand Upper Body Dressing : Minimal assistance, Sitting Lower Body Dressing: Moderate assistance, Sit to/from stand Toilet Transfer: Minimal assistance, Ambulation, Comfort height toilet, RW Toileting- Clothing Manipulation and Hygiene: Minimal assistance, Sit to/from stand Functional mobility during ADLs: Minimal assistance, Rolling walker General ADL Comments: Pt limited by pain and fatigue     Mobility  Overal bed mobility: Needs Assistance Bed Mobility: Supine to Sit Supine to sit: Supervision, HOB elevated General bed mobility comments: Pt was able to transition to EOB without assistance. VC's for general safety. HOB elevated.     Transfers  Overall transfer level: Needs assistance Equipment used: Rolling walker (2 wheeled) Transfers: Sit to/from Stand Sit to Stand: Min guard General transfer comment: VC's for hand placement on seated surface for safety. Pt with increased time to process where to put hands after cued.     Ambulation / Gait / Stairs / Wheelchair Mobility  Ambulation/Gait Ambulation/Gait assistance: Min assist, Mod assist Ambulation Distance (Feet): 200 Feet Assistive device: Rolling walker (2 wheeled), None Gait Pattern/deviations: Step-through pattern, Decreased stride length, Staggering right, Trunk flexed, Narrow base of support, Ataxic General Gait Details: Pt ambulated without RW for last 1/2 of gait training. Up to mod assist required to maintain balance at times. With RW, occasional min assist required. Frequent VC's required for safety as pt impulsively letting go of walker and requiring increased assist.  Gait velocity: decreased Gait velocity interpretation: Below normal speed for age/gender    Posture / Balance Balance Overall balance assessment: Needs assistance Sitting-balance support: Feet supported, No upper extremity supported Sitting balance-Leahy Scale: Fair Standing  balance support: No upper extremity supported Standing balance-Leahy Scale: Poor Standing balance comment: Requires assistance  High level balance activites: Backward walking, Direction changes, Turns, Sudden stops, Head turns, Other (comment) (Stepping over an object) High Level Balance Comments: Pt required increased time for preparation when stepping over an object. Gait speed significantly decreased with head turns and assist was required.     Special needs/care consideration BiPAP/CPAP No CPM No Continuous Drip IV 0.9% NS 100 mL/hr  Dialysis No      Life Vest No Oxygen No Special Bed No Trach Size No Wound Vac (area) No   Skin Left scalp incision with dressing in place.  Has swelling in her legs.                            Bowel mgmt: Last BM 11/21/16 Bladder mgmt: Voiding WDL Diabetic mgmt No    Previous Home Environment Living Arrangements: Children  Lives With: Alone Available Help at Discharge: Family, Available 24 hours/day Type of Home: House Home Layout: One level Home Access: Stairs to enter Entrance Stairs-Rails: Right Entrance Stairs-Number of Steps: 3 Bathroom Shower/Tub: Engineer, manufacturing systems: Standard Home Care Services: No  Discharge Living Setting Plans for Discharge Living Setting: Lives with (comment), Apartment (Plans home to son's apartment.) Type of Home at Discharge: Apartment (1st level apartment.) Discharge Home Layout: One level Discharge Home Access: Stairs to enter Entrance Stairs-Number of Steps: 6 step entry Does the patient have any problems obtaining your medications?: No  Social/Family/Support Systems Patient Roles: Parent, Other (Comment) (Has a son and a sister.) Contact Information: Barbette Reichmann - son Anticipated Caregiver: son Anticipated Caregiver's Contact Information: Zollie Beckers - son - [phone] Ability/Limitations of Caregiver:  Son plans to take leave from his job to care for patient after discharge. Caregiver  Availability: 24/7 Discharge Plan Discussed with Primary Caregiver: Yes Is Caregiver In Agreement with Plan?: Yes Does Caregiver/Family have Issues with Lodging/Transportation while Pt is in Rehab?: No  Goals/Additional Needs Patient/Family Goal for Rehab: PT/OT mod I and supervision goals Expected length of stay: 11-16 days Cultural Considerations: [employer] Dietary Needs: Regular diet, thin liquids Equipment Needs: TBD Pt/Family Agrees to Admission and willing to participate: Yes (I spoke with patient and her son.) Program Orientation Provided & Reviewed with Pt/Caregiver Including Roles  & Responsibilities: Yes  Decrease burden of Care through IP rehab admission: N/A  Possible need for SNF placement upon discharge: Not anticipated  Patient Condition: This patient's condition remains as documented in the consult dated 11/21/16, in which the Rehabilitation Physician determined and documented that the patient's condition is appropriate for intensive rehabilitative care in an inpatient rehabilitation facility. Will admit to inpatient rehab today.  Preadmission Screen Completed By:  Trish Mage, 11/22/2016 1:40 PM ______________________________________________________________________   Discussed status with Dr.  Wynn Banker on 11/22/16 at 1350 and received telephone approval for admission today.  Admission Coordinator:  Trish Mage, time 1350/Date 11/22/16        Cosigned by: Erick Colace, MD at 11/22/2016 2:01 PM  Revision History

## 2016-11-22 NOTE — Progress Notes (Signed)
Ankit Lorie Phenix, MD Physician Signed Physical Medicine and Rehabilitation  Consult Note Date of Service: 11/21/2016 1:03 PM  Related encounter: ED to Hosp-Admission (Discharged) from 11/17/2016 in [facility] All Collapse All   '[]' Hide copied text '[]' Hover for attribution information      Physical Medicine and Rehabilitation Consult   Reason for Consult: Left PCS and MCA aneurysms.  Referring Physician: Dr. Cyndy Freeze.    HPI: Jennifer Jacobs is a 71 y.o. female with history of HTN, fibromyalgia, seizure disorder who was admitted via [facility] 11/17/16 with acute onset of headache, photophobia and blurry vision progressing BLE weakness and left CN III palsy. She was found to have left MCA aneurysm and left posterior communicating artery aneurysm. She underwent left crani for aneurysm clipping by Dr. Cyndy Freeze. Post op with agitation due to morphine. CT head 4/3 reviewed, showing aneurysm clips. PT/OT evaluations done on 4/2 revealing decrease in balance, coordination, ataxia, difficulty with ADL tasks and staggering gait. CIR recommended for follow up therapy.    Review of Systems  Constitutional: Positive for chills.  HENT: Positive for hearing loss.   Eyes: Positive for blurred vision and double vision (ongoing of years).  Respiratory: Positive for cough and shortness of breath (with activity. ).   Cardiovascular: Positive for chest pain (with activity), palpitations and leg swelling.  Gastrointestinal: Positive for heartburn, nausea (chronic) and vomiting. Negative for constipation.  Genitourinary: Negative for dysuria and urgency.       Occasional incontinence.   Musculoskeletal: Positive for back pain and joint pain (hip, knee, ).  Neurological: Positive for dizziness, sensory change and headaches.  All other systems reviewed and are negative.         Past Medical History:  Diagnosis Date  . Allergy   . Anxiety   . Anxiety  and depression   . Arthritis   . Depression   . Dyslipidemia   . Fibromyalgia   . Hepatic cyst   . Hyperlipidemia   . Hypertension   . MI (myocardial infarction) 2007   By patient report, not substantiated by recent nuclear stress test.  . Neuromuscular disorder ([place])   . Seizure disorder ([place])    none since 2002  . Seizures ([place])   . Smoker unmotivated to quit    Quit for 3 years and then restarted  . Substance abuse    ETOH  . Vision abnormalities          Past Surgical History:  Procedure Laterality Date  . CRANIOTOMY Left 11/19/2016   Procedure: CRANIOTOMY INTRACRANIAL FOR  ANEURYSM CLIPPING;  Surgeon: Kevan Ny Ditty, MD;  Location: [facility];  Service: Neurosurgery;  Laterality: Left;  . ELBOW ARTHROPLASTY Left    rods  . Exercise tolerance test  04/27/2013   CPET-MET: Submaximal effort (0.96, with goal of greater than 1.09) -- patient states that her effort was limited due to knee pain.  This makes the rest of the interpretation difficult: Peak VO2 - 10.5 (59% moderate), peak 02-pulse -  7..11 (low); heart rate 114 (73% -chronic incompetence considered);; PFTs normal   . MOUTH SURGERY    . NM MYOVIEW LTD  05/19/2013   LexiScan: EF 80%, no evidence of ischemia or infarction  . TRANSTHORACIC ECHOCARDIOGRAM  03/25/2013   EF 55-60%, no regional wall motion abnormalities; normal diastolic parameters, normal pulmonary pressures.  No valvular lesions noted.  Essentially normal echo  Family History  Problem Relation Age of Onset  . Heart attack Father 40  . Hypertension Father   . Alcohol abuse Father   . Heart attack Maternal Grandmother   . Cancer Maternal Grandfather     type unknown  . Cancer Paternal Grandmother     type unknown  . Depression Mother   . Alcohol abuse Mother   . Ovarian cancer Paternal Aunt   . Cancer Paternal Aunt     ovarian  . Prostate cancer Paternal Uncle   . Stomach cancer Paternal  Aunt   . Diabetes Daughter   . Heart disease Daughter 72    heart attack  . Depression Sister   . Alcohol abuse Brother   . Depression Sister   . Alcohol abuse Sister   . Depression Sister   . Alcohol abuse Sister   . Alcohol abuse Brother   . Alcohol abuse Son     Social History:   Lives with alone.  Works as a Psychologist, clinical.  She reports that she quit smoking 2-3 years ago.  She has been smoking on and off since age 6.  She has never used smokeless tobacco. She reports that she quit using Marijuana and alcohol three years ago.          Allergies  Allergen Reactions  . Prozac [Fluoxetine Hcl] Other (See Comments)    Headaches          Medications Prior to Admission  Medication Sig Dispense Refill  . aspirin EC 81 MG tablet Take 81 mg by mouth daily.    Marland Kitchen docusate sodium (COLACE) 100 MG capsule Take 100 mg by mouth daily as needed for mild constipation (constipation).     Marland Kitchen escitalopram (LEXAPRO) 20 MG tablet Take 1 tablet (20 mg total) by mouth 2 (two) times daily. 60 tablet 2  . gabapentin (NEURONTIN) 300 MG capsule Take 2 capsules (600 mg total) by mouth 3 (three) times daily. 180 capsule 5  . lamoTRIgine (LAMICTAL) 100 MG tablet Take 1 tablet (100 mg total) by mouth 2 (two) times daily. 60 tablet 2  . lisinopril-hydrochlorothiazide (PRINZIDE,ZESTORETIC) 20-25 MG tablet Take 1 tablet by mouth daily. 90 tablet 3  . metoprolol tartrate (LOPRESSOR) 25 MG tablet Take 1 tablet (25 mg total) by mouth 2 (two) times daily. 180 tablet 3  . Multiple Vitamins-Minerals (CENTRUM SILVER ADULT 50+ PO) Take 1 tablet by mouth daily.    . naproxen sodium (ANAPROX) 220 MG tablet Take 220-440 mg by mouth 2 (two) times daily as needed (for pain).    . traZODone (DESYREL) 150 MG tablet Take 1 tablet (150 mg total) by mouth at bedtime. 30 tablet 2    Home: Home Living Family/patient expects to be discharged to:: Private residence Living Arrangements: Children Available  Help at Discharge: Family, Available 24 hours/day Type of Home: House Home Access: Stairs to enter [employer] of Steps: 3 Entrance Stairs-Rails: Right Home Layout: One level Bathroom Shower/Tub: Chiropodist: Standard Home Equipment: Radio producer - single point  Lives With: Alone  Functional History: Prior Function Level of Independence: Independent with assistive device(s) Comments: Uses SPC PTA. Drives. Works as [employer]  Functional Status:  Mobility: Bed Mobility Overal bed mobility: Needs Assistance Bed Mobility: Supine to Sit Supine to sit: Supervision, HOB elevated General bed mobility comments: pt initiated sitting toward side of bed with rail up and required verbal cues to sit EOB on side with rail down. no physical assist needed. Dizziness upon sitting  upright which resolved. BP 116/73. Transfers Overall transfer level: Needs assistance Equipment used: Rolling walker (2 wheeled) Transfers: Sit to/from Stand Sit to Stand: Min guard General transfer comment: cues for hand placement and safety with RW. min guard for safety. no reports of dizziness Ambulation/Gait Ambulation/Gait assistance: Min assist Ambulation Distance (Feet): 75 Feet Assistive device: Rolling walker (2 wheeled) Gait Pattern/deviations: Step-through pattern, Decreased stride length, Staggering right, Trunk flexed, Narrow base of support, Ataxic General Gait Details: verbal cues for turning and navigation of hallways due to visual field deficits. required increased time to navigate around obstalces on L due to visual field deficits. pt with ataxic uncoordinated ambulation with near crossover stepping pattern. pt with noted increased use of UE on RW. standing rest break x2 for 30 sec each Gait velocity: decreased Gait velocity interpretation: Below normal speed for age/gender  ADL: ADL Overall ADL's : Needs assistance/impaired Eating/Feeding: Set up, Bed level Grooming: Wash/dry  hands, Wash/dry face, Oral care, Set up, Supervision/safety, Bed level Upper Body Bathing: Minimal assistance, Bed level, Sitting Lower Body Bathing: Moderate assistance, Sit to/from stand Upper Body Dressing : Minimal assistance, Sitting Lower Body Dressing: Moderate assistance, Sit to/from stand Toilet Transfer: Minimal assistance, Ambulation, Comfort height toilet, RW Toileting- Clothing Manipulation and Hygiene: Minimal assistance, Sit to/from stand Functional mobility during ADLs: Minimal assistance, Rolling walker General ADL Comments: Pt limited by pain and fatigue   Cognition: Cognition Overall Cognitive Status: Impaired/Different from baseline Arousal/Alertness: Awake/alert Orientation Level: Oriented X4 Cognition Arousal/Alertness: Awake/alert Behavior During Therapy: WFL for tasks assessed/performed Overall Cognitive Status: Impaired/Different from baseline Area of Impairment: Memory, Problem solving Current Attention Level: Selective Memory: Decreased short-term memory Problem Solving: Difficulty sequencing, Requires verbal cues, Requires tactile cues General Comments: requires multimodal cues for safety and sequencing of transfers and ambulation  Blood pressure (!) 121/94, pulse 79, temperature 98.5 F (36.9 C), temperature source Oral, resp. rate 18, height '5\' 8"'  (1.727 m), weight 81.6 kg (180 lb), SpO2 100 %. Physical Exam  Nursing note and vitals reviewed. Constitutional: She is oriented to person, place, and time. She appears well-developed and well-nourished.  HENT:  Head: Atraumatic.  Left scalp incision c/d/i  Eyes:  Left ptosis with lid edema and erythema.   Neck: Normal range of motion. Neck supple.  Cardiovascular: Normal rate and regular rhythm.   Respiratory: Effort normal and breath sounds normal. No stridor.  GI: Soft. Bowel sounds are normal. She exhibits no distension. There is no tenderness.  Musculoskeletal: She exhibits no edema or tenderness.    Neurological: She is alert and oriented to person, place, and time.  Speech clear.  Able to follow commands without difficulty.  Motor: RUE 5/5 RLE 4-/5 HF, KE, 5/5 ADF/PF LUE 4/5 LLE 3+/5 HF, KE, 4/5 ADF/PF Sensation intact to light touch DTRs symmetric  Skin: Skin is warm and dry.  Psychiatric: She has a normal mood and affect. Her behavior is normal.    Lab Results Last 24 Hours  No results found for this or any previous visit (from the past 24 hour(s)).    Imaging Results (Last 48 hours)  Ct Head Wo Contrast  Result Date: 11/20/2016 CLINICAL DATA:  71 y/o F; status post craniotomy for clipping of left posterior communicating and middle cerebral artery aneurysms. For EXAM: CT HEAD WITHOUT CONTRAST TECHNIQUE: Contiguous axial images were obtained from the base of the skull through the vertex without intravenous contrast. COMPARISON:  None. FINDINGS: Brain: No large territory infarct, brain parenchymal hemorrhage, or focal mass effect. No  hydrocephalus. No effacement of basilar cisterns. Small extra-axial fluid collection over the frontal convexities and moderate pneumocephalus with anti dependent distribution compatible with postsurgical changes related to craniotomy. Vascular: Aneurysm clips are present near the left paraclinoid internal carotid artery and within the left lateral MCA cistern. Skull: Left frontotemporal craniotomy. Air and edema in the overlying scalp and scalp skin staples noted. Sinuses/Orbits: No acute finding. Other: Periapical cysts of maxillary incisors and canines compatible with odontogenic disease. IMPRESSION: 1. Expected postsurgical changes related to left frontotemporal craniotomy with air and edema in the scalp, small bifrontal extra-axial fluid collections, and moderate pneumocephalus. 2. Aneurysm clips adjacent to left paraclinoid ICA an within left lateral MCA cistern. 3. No large territory infarct, brain parenchymal hemorrhage, or focal mass effect. 4.  Periapical cysts of maxillary incisors and canines compatible with odontogenic disease. Electronically Signed   By: Kristine Garbe M.D.   On: 11/20/2016 05:06     Assessment/Plan: Diagnosis: Left MCA aneurysm and left posterior communicating artery aneurysm s/p clipping Labs and images independently reviewed.  Records reviewed and summated above.  1. Does the need for close, 24 hr/day medical supervision in concert with the patient's rehab needs make it unreasonable for this patient to be served in a less intensive setting? Yes  2. Co-Morbidities requiring supervision/potential complications: HTN (monitor and provide prns in accordance with increased physical exertion and pain), fibromyalgia (Biofeedback training with therapies to help reduce reliance on opiate pain medications, monitor pain control during therapies, and sedation at rest and titrate to maximum efficacy to ensure participation and gains in therapies), seizure disorder (cont meds), ABLA (transfuse if necessary to ensure appropriate perfusion for increased activity tolerance), leukocytosis (cont to monitor for signs and symptoms of infection, further workup if indicated) 3. Due to safety, skin/wound care, disease management, medication administration, pain management and patient education, does the patient require 24 hr/day rehab nursing? Yes 4. Does the patient require coordinated care of a physician, rehab nurse, PT (1-2 hrs/day, 5 days/week) and OT (1-2 hrs/day, 5 days/week) to address physical and functional deficits in the context of the above medical diagnosis(es)? Yes Addressing deficits in the following areas: balance, endurance, locomotion, strength, transferring, bowel/bladder control, toileting and psychosocial support 5. Can the patient actively participate in an intensive therapy program of at least 3 hrs of therapy per day at least 5 days per week? Yes 6. The potential for patient to make measurable gains while  on inpatient rehab is excellent 7. Anticipated functional outcomes upon discharge from inpatient rehab are modified independent and supervision  with PT, modified independent with OT, n/a with SLP. 8. Estimated rehab length of stay to reach the above functional goals is: 11-16 days. 9. Does the patient have adequate social supports and living environment to accommodate these discharge functional goals? Yes 10. Anticipated D/C setting: Home 11. Anticipated post D/C treatments: HH therapy and Home excercise program 12. Overall Rehab/Functional Prognosis: good  RECOMMENDATIONS: This patient's condition is appropriate for continued rehabilitative care in the following setting: CIR Patient has agreed to participate in recommended program. Yes Note that insurance prior authorization may be required for reimbursement for recommended care.  Comment: Rehab Admissions Coordinator to follow up.  Flora Lipps 11/21/2016    Revision History                   Routing History

## 2016-11-22 NOTE — Discharge Summary (Signed)
Physician Discharge Summary  Patient ID: Jennifer Jacobs MRN: [id number] DOB/AGE: 28-Aug-1945 71 y.o.  Admit date: 11/17/2016 Discharge date: 11/22/2016  PRE-OPERATIVE DIAGNOSIS:  Left posterior communicating artery and middle cerebral artery aneurysms, acute left third nerve palsy  POST-OPERATIVE DIAGNOSIS:  Same  PROCEDURE:  Procedure(s): CRANIOTOMY INTRACRANIAL FOR  ANEURYSM CLIPPING (Left)  Discharge Diagnoses:  Same Active Problems:   Aneurysm (HCC)   Aneurysm of posterior communicating artery   Intracranial aneurysm   Posterior communicating artery aneurysm   Benign essential HTN   Seizure disorder ([facility])   Acute blood loss anemia   Leukocytosis   Discharged Condition: Stable  Hospital Course:  Jennifer Jacobs is a 71 y.o. female was admitted for the above procedure. Her post operative course was without stable conditions. It was recommended she be sent to CIR. Agree with this decision.  Discharge Exam: Blood pressure (!) 149/83, pulse 72, temperature 98.5 F (36.9 C), temperature source Oral, resp. rate 16, height 5\' 8"  (1.727 m), weight 81.6 kg (180 lb), SpO2 96 %. Awake, alert, oriented Speech fluent, appropriate CN grossly intact with exception of left CN 3 palsy. 5/5 BUE/BLE Wound c/d/i  Disposition: 01-Home or Self Care  Discharge Instructions    Call MD for:  difficulty breathing, headache or visual disturbances    Complete by:  As directed    Call MD for:  persistant dizziness or light-headedness    Complete by:  As directed    Call MD for:  redness, tenderness, or signs of infection (pain, swelling, redness, odor or green/yellow discharge around incision site)    Complete by:  As directed    Call MD for:  severe uncontrolled pain    Complete by:  As directed    Call MD for:  temperature >100.4    Complete by:  As directed    Diet - low sodium heart healthy    Complete by:  As directed    Driving Restrictions    Complete by:  As directed    Do  not drive until given clearance.   Increase activity slowly    Complete by:  As directed    Lifting restrictions    Complete by:  As directed    Do not lift anything >10lbs. Avoid bending and twisting in awkward positions. Avoid bending at the back.     Allergies as of 11/22/2016      Reactions   Prozac [fluoxetine Hcl] Other (See Comments)   Headaches      Medication List    TAKE these medications   aspirin EC 81 MG tablet Take 81 mg by mouth daily.   CENTRUM SILVER ADULT 50+ PO Take 1 tablet by mouth daily.   docusate sodium 100 MG capsule Commonly known as:  COLACE Take 100 mg by mouth daily as needed for mild constipation (constipation).   escitalopram 20 MG tablet Commonly known as:  LEXAPRO Take 1 tablet (20 mg total) by mouth 2 (two) times daily.   gabapentin 300 MG capsule Commonly known as:  NEURONTIN Take 2 capsules (600 mg total) by mouth 3 (three) times daily.   HYDROcodone-acetaminophen 5-325 MG tablet Commonly known as:  NORCO/VICODIN Take 1 tablet by mouth every 4 (four) hours as needed for moderate pain.   lamoTRIgine 100 MG tablet Commonly known as:  LAMICTAL Take 1 tablet (100 mg total) by mouth 2 (two) times daily.   levETIRAcetam 500 MG tablet Commonly known as:  KEPPRA Take 1 tablet (500 mg total) by  mouth 2 (two) times daily.   lisinopril-hydrochlorothiazide 20-25 MG tablet Commonly known as:  PRINZIDE,ZESTORETIC Take 1 tablet by mouth daily.   methocarbamol 500 MG tablet Commonly known as:  ROBAXIN Take 1 tablet (500 mg total) by mouth 3 (three) times daily.   metoprolol tartrate 25 MG tablet Commonly known as:  LOPRESSOR Take 1 tablet (25 mg total) by mouth 2 (two) times daily.   naproxen sodium 220 MG tablet Commonly known as:  ANAPROX Take 220-440 mg by mouth 2 (two) times daily as needed (for pain).   traZODone 150 MG tablet Commonly known as:  DESYREL Take 1 tablet (150 mg total) by mouth at bedtime.         SignedAlyson Ingles 11/22/2016, 1:50 PM

## 2016-11-22 NOTE — Progress Notes (Signed)
Patient discharged to rehab for continue therapy to progress patient home. Patient and family stated understanding of discharged instructions given. Report given to receiving nurse and family present at discharge time.

## 2016-11-22 NOTE — PMR Pre-admission (Signed)
PMR Admission Coordinator Pre-Admission Assessment  Patient: Jennifer Jacobs is an 71 y.o., female MRN: [id number] DOB: 1946/08/12 Height:  (172.7 cm) Weight: 81.6 kg (180 lb)              Insurance Information HMO: No   PPO:       PCP:       IPA:       80/20:       OTHER:   PRIMARY:  Medicare A/B      Policy#: [id number]      Subscriber:  Weston Anna CM Name:        Phone#:       Fax#:   Pre-Cert#:        Employer:  Works PT Benefits:  Phone #:       Name:  Checked in [employer]. Date: 06/20/13     Deduct: $1340      Out of Pocket Max:  None      Life Max: unlimited CIR: 100%      SNF: 100 days Outpatient: 80%     Co-Pay: 20% Home Health: 100%      Co-Pay: none   DME: 80%     Co-Pay: 20% Providers: patient's choice  Emergency Contact Information Contact Information    Name Relation Home Work Mobile   Cummings,Walter Son [phone]     Carver Fila [phone]  [phone]     Current Medical History  Patient Admitting Diagnosis: Left MCA aneurysm and left posterior communicating artery aneurysm s/p clipping   History of Present Illness: A 71 y.o. female with history of HTN, fibromyalgia, seizure disorder who was admitted via [facility] 11/17/16 with acute onset of headache, photophobia and blurry vision progressing BLE weakness and left CN III palsy. She was found to have left MCA aneurysm and left posterior communicating artery aneurysm. She underwent left crani for aneurysm clipping by Dr. Bevely Palmer. Post op with agitation due to morphine. CT head 4/3 reviewed, showing aneurysm clips. PT/OT evaluations done on 4/2 revealing decrease in balance, coordination, ataxia, difficulty with ADL tasks and staggering gait. CIR recommended for follow up therapy.    Total: 3=NIH  Past Medical History  Past Medical History:  Diagnosis Date  . Acute blood loss anemia   . Allergy   . Anxiety   . Anxiety and depression   . Arthritis   . Depression   . Dyslipidemia   .  Fibromyalgia   . Hepatic cyst   . Hyperlipidemia   . Hypertension   . MI (myocardial infarction) 2007   By patient report, not substantiated by recent nuclear stress test.  . Neuromuscular disorder (HCC)   . Seizure disorder (HCC)    none since 2002  . Seizures (HCC)   . Smoker unmotivated to quit    Quit for 3 years and then restarted  . Substance abuse    ETOH  . Vision abnormalities     Family History  family history includes Alcohol abuse in her brother, brother, father, mother, sister, sister, and son; Cancer in her maternal grandfather, paternal aunt, and paternal grandmother; Depression in her mother, sister, sister, and sister; Diabetes in her daughter; Heart attack in her maternal grandmother; Heart attack (age of onset: 9) in her father; Heart disease (age of onset: 25) in her daughter; Hypertension in her father; Ovarian cancer in her paternal aunt; Prostate cancer in her paternal uncle; Stomach cancer in her paternal aunt.  Prior Rehab/Hospitalizations: No previous rehab admissions  Has  the patient had major surgery during 100 days prior to admission? No  Current Medications   Current Facility-Administered Medications:  .   stroke: mapping our early stages of recovery book, , Does not apply, Once, Maeola Harman, MD .  0.9 %  sodium chloride infusion, , Intravenous, Once, Loura Halt Ditty, MD .  0.9 %  sodium chloride infusion, , Intravenous, Continuous, Loura Halt Ditty, MD, Stopped at 11/20/16 1230 .  acetaminophen (TYLENOL) tablet 650 mg, 650 mg, Oral, Q4H PRN, 650 mg at 11/20/16 0342 **OR** acetaminophen (TYLENOL) solution 650 mg, 650 mg, Per Tube, Q4H PRN **OR** acetaminophen (TYLENOL) suppository 650 mg, 650 mg, Rectal, Q4H PRN, Maeola Harman, MD .  acetaminophen-codeine (TYLENOL #3) 300-30 MG per tablet 1-2 tablet, 1-2 tablet, Oral, Q4H PRN, Maeola Harman, MD, 2 tablet at 11/22/16 1308 .  aspirin EC tablet 81 mg, 81 mg, Oral, Daily, Maeola Harman, MD, 81 mg at  11/22/16 1024 .  bisacodyl (DULCOLAX) EC tablet 5 mg, 5 mg, Oral, Daily PRN, Loura Halt Ditty, MD .  [address] dexamethasone (DECADRON) injection 6 mg, 6 mg, Intravenous, Q6H, 6 mg at 11/20/16 1255 **FOLLOWED BY** [COMPLETED] dexamethasone (DECADRON) injection 4 mg, 4 mg, Intravenous, Q6H, 4 mg at 11/21/16 1252 **FOLLOWED BY** dexamethasone (DECADRON) injection 4 mg, 4 mg, Intravenous, Q8H, Loura Halt Ditty, MD, 4 mg at 11/22/16 1307 .  docusate sodium (COLACE) capsule 100 mg, 100 mg, Oral, BID, Loura Halt Ditty, MD, 100 mg at 11/22/16 1024 .  escitalopram (LEXAPRO) tablet 20 mg, 20 mg, Oral, BID, Maeola Harman, MD, 20 mg at 11/22/16 1024 .  gabapentin (NEURONTIN) capsule 600 mg, 600 mg, Oral, TID, Loura Halt Ditty, MD, 600 mg at 11/22/16 1024 .  hydrALAZINE (APRESOLINE) injection 5-10 mg, 5-10 mg, Intravenous, Q1H PRN, Loura Halt Ditty, MD .  hydrochlorothiazide (HYDRODIURIL) tablet 25 mg, 25 mg, Oral, Daily, Maeola Harman, MD, 25 mg at 11/22/16 1024 .  HYDROcodone-acetaminophen (NORCO/VICODIN) 5-325 MG per tablet 1 tablet, 1 tablet, Oral, Q4H PRN, Loura Halt Ditty, MD, 1 tablet at 11/21/16 2141 .  HYDROmorphone (DILAUDID) injection 0.5-1 mg, 0.5-1 mg, Intravenous, Q2H PRN, Loura Halt Ditty, MD, 1 mg at 11/20/16 1458 .  lamoTRIgine (LAMICTAL) tablet 100 mg, 100 mg, Oral, BID, Maeola Harman, MD, 100 mg at 11/22/16 1024 .  levETIRAcetam (KEPPRA) tablet 500 mg, 500 mg, Oral, BID, Loura Halt Ditty, MD, 500 mg at 11/22/16 1024 .  lisinopril (PRINIVIL,ZESTRIL) tablet 20 mg, 20 mg, Oral, Daily, Maeola Harman, MD, 20 mg at 11/22/16 1024 .  metoprolol tartrate (LOPRESSOR) tablet 25 mg, 25 mg, Oral, BID, Maeola Harman, MD, 25 mg at 11/22/16 1024 .  morphine 2 MG/ML injection 1-4 mg, 1-4 mg, Intravenous, Q1H PRN, Maeola Harman, MD, 2 mg at 11/20/16 0314 .  naloxone [employer]) injection 0.08 mg, 0.08 mg, Intravenous, PRN, Loura Halt Ditty, MD .  ondansetron [facility]) tablet 4 mg, 4  mg, Oral, Q4H PRN **OR** ondansetron (ZOFRAN) injection 4 mg, 4 mg, Intravenous, Q4H PRN, Loura Halt Ditty, MD .  pantoprazole (PROTONIX) EC tablet 40 mg, 40 mg, Oral, QHS, Loura Halt Ditty, MD, 40 mg at 11/21/16 2140 .  potassium chloride SA (K-DUR,KLOR-CON) CR tablet 20 mEq, 20 mEq, Oral, BID, Loura Halt Ditty, MD, 20 mEq at 11/22/16 1024 .  promethazine (PHENERGAN) tablet 12.5-25 mg, 12.5-25 mg, Oral, Q4H PRN, Loura Halt Ditty, MD .  senna [facility]) tablet 8.6 mg, 1 tablet, Oral, BID, Loura Halt Ditty, MD, 8.6 mg at 11/22/16 1024 .  sodium phosphate (FLEET) 7-19  GM/118ML enema 1 enema, 1 enema, Rectal, Once PRN, Loura Halt Ditty, MD .  traZODone (DESYREL) tablet 150 mg, 150 mg, Oral, QHS, Maeola Harman, MD, 150 mg at 11/21/16 2141  Patients Current Diet: Diet regular Room service appropriate? Yes; Fluid consistency: Thin  Precautions / Restrictions Precautions Precautions: Fall Precaution Comments: left eye ptosis Restrictions Weight Bearing Restrictions: No   Has the patient had 2 or more falls or a fall with injury in the past year?YesHas had 7-10 falls with injury to ankle, face.  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  Home Assistive Devices / Equipment Home Assistive Devices/Equipment: None Home Equipment: Cane - single point  Prior Device Use: Indicate devices/aids used by the patient prior to current illness, exacerbation or injury? Walker and [employer]  Prior Functional Level Prior Function Level of Independence: Independent with assistive device(s) Comments: Uses [employer] P[employer]. Drives. Works as [employer]   Self Care: Did the patient need help bathing, dressing, using the toilet or eating?  Independent  Indoor Mobility: Did the patient need assistance with walking from room to room (with or without device)? Independent  Stairs: Did the patient need assistance with internal or external stairs  (with or without device)? Independent  Functional Cognition: Did the patient need help planning regular tasks such as shopping or remembering to take medications? Independent  Current Functional Level Cognition  Arousal/Alertness: Awake/alert Overall Cognitive Status: Impaired/Different from baseline Current Attention Level: Selective Orientation Level: Oriented X4 Safety/Judgement: Decreased awareness of safety, Decreased awareness of deficits General Comments: requires multimodal cues for safety and sequencing of transfers and ambulation    Extremity Assessment (includes Sensation/Coordination)  Upper Extremity Assessment: RUE deficits/detail, LUE deficits/detail, Generalized weakness RUE Deficits / Details: tremort noted bil. UEs that she reports is new since surgery  RUE Coordination: decreased fine motor LUE Deficits / Details: tremort noted bil. UEs that she reports is new since surgery LUE Coordination: decreased fine motor  Lower Extremity Assessment: Defer to PT evaluation RLE Deficits / Details: Grossly ~4/5 throughout. RLE Sensation:  (WFL) LLE Deficits / Details: Grossly ~3/5 throughout, instability noted during functional tasks. LLE Sensation:  [facility])    ADLs  Overall ADL's : Needs assistance/impaired Eating/Feeding: Set up, Bed level Grooming: Wash/dry hands, Wash/dry face, Oral care, Set up, Supervision/safety, Bed level Upper Body Bathing: Minimal assistance, Bed level, Sitting Lower Body Bathing: Moderate assistance, Sit to/from stand Upper Body Dressing : Minimal assistance, Sitting Lower Body Dressing: Moderate assistance, Sit to/from stand Toilet Transfer: Minimal assistance, Ambulation, Comfort height toilet, RW Toileting- Clothing Manipulation and Hygiene: Minimal assistance, Sit to/from stand Functional mobility during ADLs: Minimal assistance, Rolling walker General ADL Comments: Pt limited by pain and fatigue     Mobility  Overal bed mobility: Needs  Assistance Bed Mobility: Supine to Sit Supine to sit: Supervision, HOB elevated General bed mobility comments: Pt was able to transition to EOB without assistance. VC's for general safety. HOB elevated.     Transfers  Overall transfer level: Needs assistance Equipment used: Rolling walker (2 wheeled) Transfers: Sit to/from Stand Sit to Stand: Min guard General transfer comment: VC's for hand placement on seated surface for safety. Pt with increased time to process where to put hands after cued.     Ambulation / Gait / Stairs / Wheelchair Mobility  Ambulation/Gait Ambulation/Gait assistance: Min assist, Mod assist Ambulation Distance (Feet): 200 Feet Assistive device: Rolling walker (2 wheeled), None Gait Pattern/deviations: Step-through pattern, Decreased stride  length, Staggering right, Trunk flexed, Narrow base of support, Ataxic General Gait Details: Pt ambulated without RW for last 1/2 of gait training. Up to mod assist required to maintain balance at times. With RW, occasional min assist required. Frequent VC's required for safety as pt impulsively letting go of walker and requiring increased assist.  Gait velocity: decreased Gait velocity interpretation: Below normal speed for age/gender    Posture / Balance Balance Overall balance assessment: Needs assistance Sitting-balance support: Feet supported, No upper extremity supported Sitting balance-Leahy Scale: Fair Standing balance support: No upper extremity supported Standing balance-Leahy Scale: Poor Standing balance comment: Requires assistance  High level balance activites: Backward walking, Direction changes, Turns, Sudden stops, Head turns, Other (comment) (Stepping over an object) High Level Balance Comments: Pt required increased time for preparation when stepping over an object. Gait speed significantly decreased with head turns and assist was required.     Special needs/care consideration BiPAP/CPAP No CPM  No Continuous Drip IV 0.9% NS 100 mL/hr  Dialysis No      Life Vest No Oxygen No Special Bed No Trach Size No Wound Vac (area) No   Skin Left scalp incision with dressing in place.  Has swelling in her legs.                            Bowel mgmt: Last BM 11/21/16 Bladder mgmt: Voiding WDL Diabetic mgmt No    Previous Home Environment Living Arrangements: Children  Lives With: Alone Available Help at Discharge: Family, Available 24 hours/day Type of Home: House Home Layout: One level Home Access: Stairs to enter Entrance Stairs-Rails: Right Entrance Stairs-Number of Steps: 3 Bathroom Shower/Tub: Engineer, manufacturing systems: Standard Home Care Services: No  Discharge Living Setting Plans for Discharge Living Setting: Lives with (comment), Apartment (Plans home to son's apartment.) Type of Home at Discharge: Apartment (1st level apartment.) Discharge Home Layout: One level Discharge Home Access: Stairs to enter Entrance Stairs-Number of Steps: 6 step entry Does the patient have any problems obtaining your medications?: No  Social/Family/Support Systems Patient Roles: Parent, Other (Comment) (Has a son and a sister.) Contact Information: Barbette Reichmann - son Anticipated Caregiver: son Anticipated Caregiver's Contact Information: Zollie Beckers - son - [phone] Ability/Limitations of Caregiver: Son plans to take leave from his job to care for patient after discharge. Caregiver Availability: 24/7 Discharge Plan Discussed with Primary Caregiver: Yes Is Caregiver In Agreement with Plan?: Yes Does Caregiver/Family have Issues with Lodging/Transportation while Pt is in Rehab?: No  Goals/Additional Needs Patient/Family Goal for Rehab: PT/OT mod I and supervision goals Expected length of stay: 11-16 days Cultural Considerations: [employer] Dietary Needs: Regular diet, thin liquids Equipment Needs: TBD Pt/Family Agrees to Admission and willing to participate: Yes (I spoke with  patient and her son.) Program Orientation Provided & Reviewed with Pt/Caregiver Including Roles  & Responsibilities: Yes  Decrease burden of Care through IP rehab admission: N/A  Possible need for SNF placement upon discharge: Not anticipated  Patient Condition: This patient's condition remains as documented in the consult dated 11/21/16, in which the Rehabilitation Physician determined and documented that the patient's condition is appropriate for intensive rehabilitative care in an inpatient rehabilitation facility. Will admit to inpatient rehab today.  Preadmission Screen Completed By:  Trish Mage, 11/22/2016 1:40 PM ______________________________________________________________________   Discussed status with Dr.  Wynn Banker on 11/22/16 at 1350 and received telephone approval for admission today.  Admission Coordinator:  Trish Mage, time  1350/Date 11/22/16

## 2016-11-22 NOTE — Progress Notes (Signed)
No acute events Neuro stable Incision looks good D/c to CIR when accepted

## 2016-11-23 ENCOUNTER — Inpatient Hospital Stay (HOSPITAL_COMMUNITY): Payer: Self-pay | Admitting: Physical Therapy

## 2016-11-23 ENCOUNTER — Inpatient Hospital Stay (HOSPITAL_COMMUNITY): Payer: Medicare Other | Admitting: Occupational Therapy

## 2016-11-23 DIAGNOSIS — I671 Cerebral aneurysm, nonruptured: Secondary | ICD-10-CM

## 2016-11-23 LAB — CBC WITH DIFFERENTIAL/PLATELET
BASOS ABS: 0 10*3/uL (ref 0.0–0.1)
Basophils Relative: 0 %
EOS ABS: 0 10*3/uL (ref 0.0–0.7)
EOS PCT: 0 %
HCT: 29.8 % — ABNORMAL LOW (ref 36.0–46.0)
HEMOGLOBIN: 9.9 g/dL — AB (ref 12.0–15.0)
LYMPHS PCT: 10 %
Lymphs Abs: 1.4 10*3/uL (ref 0.7–4.0)
MCH: 30.4 pg (ref 26.0–34.0)
MCHC: 33.2 g/dL (ref 30.0–36.0)
MCV: 91.4 fL (ref 78.0–100.0)
Monocytes Absolute: 1.1 10*3/uL — ABNORMAL HIGH (ref 0.1–1.0)
Monocytes Relative: 8 %
NEUTROS PCT: 82 %
Neutro Abs: 11.4 10*3/uL — ABNORMAL HIGH (ref 1.7–7.7)
PLATELETS: 239 10*3/uL (ref 150–400)
RBC: 3.26 MIL/uL — AB (ref 3.87–5.11)
RDW: 13.8 % (ref 11.5–15.5)
WBC: 13.8 10*3/uL — AB (ref 4.0–10.5)

## 2016-11-23 LAB — COMPREHENSIVE METABOLIC PANEL
ALT: 40 U/L (ref 14–54)
AST: 40 U/L (ref 15–41)
Albumin: 2.6 g/dL — ABNORMAL LOW (ref 3.5–5.0)
Alkaline Phosphatase: 43 U/L (ref 38–126)
Anion gap: 9 (ref 5–15)
BILIRUBIN TOTAL: 0.4 mg/dL (ref 0.3–1.2)
BUN: 16 mg/dL (ref 6–20)
CHLORIDE: 102 mmol/L (ref 101–111)
CO2: 27 mmol/L (ref 22–32)
Calcium: 9.4 mg/dL (ref 8.9–10.3)
Creatinine, Ser: 0.84 mg/dL (ref 0.44–1.00)
GFR calc Af Amer: >60 mL/min (ref >60)
Glucose, Bld: 118 mg/dL — ABNORMAL HIGH (ref 65–99)
POTASSIUM: 4.2 mmol/L (ref 3.5–5.1)
Sodium: 138 mmol/L (ref 135–145)
TOTAL PROTEIN: 5.8 g/dL — AB (ref 6.5–8.1)

## 2016-11-23 MED ORDER — NAPHAZOLINE-GLYCERIN 0.012-0.2 % OP SOLN
1.0000 [drp] | Freq: Four times a day (QID) | OPHTHALMIC | Status: DC | PRN
  Filled 2016-11-23: qty 15

## 2016-11-23 MED ORDER — ARTIFICIAL TEARS OP OINT
TOPICAL_OINTMENT | Freq: Two times a day (BID) | OPHTHALMIC | Status: DC
  Administered 2016-11-23 – 2016-11-27 (×9): via OPHTHALMIC
  Filled 2016-11-23 (×2): qty 3.5

## 2016-11-23 MED ORDER — NAPHAZOLINE-GLYCERIN 0.03-0.5 % OP SOLN
1.0000 [drp] | Freq: Four times a day (QID) | OPHTHALMIC | Status: DC | PRN
  Administered 2016-11-24: 2 [drp] via OPHTHALMIC
  Administered 2016-11-25: 1 [drp] via OPHTHALMIC
  Filled 2016-11-23 (×4): qty 1

## 2016-11-23 NOTE — Care Management Note (Signed)
[facility]patient [facility] Individual Statement of Services  Patient Name:  Jennifer Jacobs  Date:  11/23/2016  Welcome to the [facility].  Our goal is to provide you with an individualized program based on your diagnosis and situation, designed to meet your specific needs.  With this comprehensive rehabilitation program, you will be expected to participate in at least 3 hours of rehabilitation therapies Monday-Friday, with modified therapy programming on the weekends.  Your rehabilitation program will include the following services:  Physical Therapy (PT), Occupational Therapy (OT), Speech Therapy (ST), 24 hour per day rehabilitation nursing, Therapeutic Recreaction (TR), Neuropsychology, Case Management (Social Worker), Rehabilitation Medicine, Nutrition Services and Pharmacy Services  Weekly team conferences will be held on Wednesday to discuss your progress.  Your Social Worker will talk with you frequently to get your input and to update you on team discussions.  Team conferences with you and your family in attendance may also be held.  Expected length of stay: 6-8 days  Overall anticipated outcome: mod/I-supervision level  Depending on your progress and recovery, your program may change. Your Social Worker will coordinate services and will keep you informed of any changes. Your Social Worker's name and contact numbers are listed  below.  The following services may also be recommended but are not provided by the Inpatient Rehabilitation Center:   Driving Evaluations  Home Health Rehabiltiation Services  Outpatient Rehabilitation Services  Vocational Rehabilitation   Arrangements will be made to provide these services after discharge if needed.  Arrangements include referral to agencies that provide these services.  Your insurance has been verified to be:  Medicare Your primary doctor is:  Rica Mast  Pertinent information will be shared with your doctor  and your insurance company.  Social Worker:  Dossie Der, SW [phone] or ([phone]  Information discussed with and copy given to patient by: Lucy Chris, 11/23/2016, 10:35 AM

## 2016-11-23 NOTE — Evaluation (Signed)
Occupational Therapy Assessment and Plan  Patient Details  Name: MAKENDRA VIGEANT MRN: [id number] Date of Birth: 03/25/1946  OT Diagnosis: abnormal posture, blindness and low vision, cognitive deficits and muscle weakness (generalized) Rehab Potential: Rehab Potential (ACUTE ONLY): Excellent ELOS: 5-7 days   Today's Date: 11/23/2016 OT Individual Time: 1045-1200 OT Individual Time Calculation (min): 75 min     Problem List:  Patient Active Problem List   Diagnosis Date Noted  . Gait disturbance, post-stroke 11/22/2016  . Cognitive deficit due to recent stroke 11/22/2016  . Aneurysm of anterior communicating artery 11/22/2016  . Intracranial aneurysm   . Posterior communicating artery aneurysm   . Benign essential HTN   . Seizure disorder ([place])   . Acute blood loss anemia   . Leukocytosis   . Aneurysm ([place]) 11/18/2016  . 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  . Bilateral leg pain 06/26/2016  . AAA (abdominal aortic aneurysm) without rupture ([place]) 06/26/2016  . Insomnia 06/07/2015  . Urinary incontinence 06/07/2015  . Chronic pain syndrome 03/04/2015  . Fibromyalgia 03/04/2015  . Neck pain 03/04/2015  . Cervical radiculitis 03/04/2015  . Lower back pain 03/04/2015  . Lumbar radicular pain 03/04/2015  . Major depression 07/05/2014  . Alcohol abuse 07/05/2014  . Liver cyst 03/18/2014  . Claudication, class I ([place]) 03/18/2013  . Hypertension   . Smoker unmotivated to quit     Past Medical History:  Past Medical History:  Diagnosis Date  . Acute blood loss anemia   . Allergy   . Anxiety   . Anxiety and depression   . Arthritis   . Cognitive deficit due to recent stroke 11/22/2016  . Depression   . Dyslipidemia   . Fibromyalgia   . Hepatic cyst   . Hyperlipidemia   . Hypertension   . MI (myocardial infarction) 2007   By patient report, not substantiated by recent nuclear stress test.  . Neuromuscular disorder ([place])   . Seizure disorder ([place])     none since 2002  . Seizures ([place])   . Smoker unmotivated to quit    Quit for 3 years and then restarted  . Substance abuse    ETOH  . Vision abnormalities    Past Surgical History:  Past Surgical History:  Procedure Laterality Date  . CRANIOTOMY Left 11/19/2016   Procedure: CRANIOTOMY INTRACRANIAL FOR  ANEURYSM CLIPPING;  Surgeon: Kevan Ny Ditty, MD;  Location: [facility];  Service: Neurosurgery;  Laterality: Left;  . ELBOW ARTHROPLASTY Left    rods  . Exercise tolerance test  04/27/2013   CPET-MET: Submaximal effort (0.96, with goal of greater than 1.09) -- patient states that her effort was limited due to knee pain.  This makes the rest of the interpretation difficult: Peak VO2 - 10.5 (59% moderate), peak 02-pulse -  7..11 (low); heart rate 114 (73% -chronic incompetence considered);; PFTs normal   . MOUTH SURGERY    . NM MYOVIEW LTD  05/19/2013   LexiScan: EF 80%, no evidence of ischemia or infarction  . TRANSTHORACIC ECHOCARDIOGRAM  03/25/2013   EF 55-60%, no regional wall motion abnormalities; normal diastolic parameters, normal pulmonary pressures.  No valvular lesions noted.  Essentially normal echo     Assessment & Plan Clinical Impression: Temika Sutphin Cobbis a 71 y.o.femalewith history of HTN, fibromyalgia, seizure disorder who was admitted via [facility] 11/17/16 with acute onset of headache, photophobia and blurry vision progressing BLE weakness and left CN III palsy. She was found to have left MCA aneurysm and  left posterior communicating artery aneurysm. She underwent left crani for aneurysm clipping by Dr. Cyndy Freeze. Post op had agitation due to morphine. CT head 4/3 with expected surgical changes and  showing aneurysm clips.  Therapy ongoing and patient limited by decrease in balance with impulsivity, STM deficits and decreased awareness of deficits with poor safety.  CIR recommended for follow up therapy.  Patient transferred to CIR on 11/22/2016 .    Patient currently requires min with  basic self-care skills secondary to muscle weakness, decreased cardiorespiratoy endurance, decreased visual acuity and L eye ptosis and decreased standing balance, decreased postural control and decreased balance strategies.  Prior to hospitalization, patient could complete ADLs/IADLs with modified independent .  Patient will benefit from skilled intervention to decrease level of assist with basic self-care skills, increase independence with basic self-care skills and increase level of independence with iADL prior to discharge home with care partner.  Anticipate patient will require intermittent supervision and follow up home health.  OT - End of Session Activity Tolerance: Tolerates 10 - 20 min activity with multiple rests Endurance Deficit: Yes Endurance Deficit Description: Rest breaks required throughout bathing/dressing session OT Assessment Rehab Potential (ACUTE ONLY): Excellent OT Patient demonstrates impairments in the following area(s): Balance;Cognition;Endurance;Safety;Vision OT Basic ADL's Functional Problem(s): Grooming;Bathing;Dressing;Toileting OT Advanced ADL's Functional Problem(s): Simple Meal Preparation OT Transfers Functional Problem(s): Toilet;Tub/Shower OT Additional Impairment(s): None OT Plan OT Intensity: Minimum of 1-2 x/day, 45 to 90 minutes OT Frequency: 5 out of 7 days OT Duration/Estimated Length of Stay: 5-7 days OT Treatment/Interventions: Balance/vestibular training;Cognitive remediation/compensation;Discharge planning;Community reintegration;DME/adaptive equipment instruction;Functional mobility training;Pain management;Patient/family education;Self Care/advanced ADL retraining;Therapeutic Activities;Therapeutic Exercise;UE/LE Strength taining/ROM;Visual/perceptual remediation/compensation OT Self Feeding Anticipated Outcome(s): Mod I OT Basic Self-Care Anticipated Outcome(s): Mod I OT Toileting Anticipated Outcome(s): Mod I OT Bathroom Transfers Anticipated  Outcome(s): Mod I OT Recommendation Recommendations for Other Services: Speech consult Patient destination: Home Follow Up Recommendations: Home health OT;Outpatient OT Equipment Recommended: Tub/shower bench   Skilled Therapeutic Intervention Pt seen for OT eval and ADL bathing/dressing session. Pt on toilet upon arrival with family present as well as staff. Pt attempted to get to bathroom independently. Reviewed need for assist with all mobility, pt voiced understanding.   She ambulated thorughout session with CGA, occasional LOB when distracted requiring min A to regain balance. She bathed sit <> Stand in shower with cues for safety awareness as pt attempting to stand and balance to wash feet etc. Grooming completed standing at sink with heavy reliance on counter top for balance and due to decreased activity tolerance.  She ambulated throughout unit to ADL apartment and completed simulated tub/shower transfer utilizing tub bench with supervision-min A with VCs for technique- simulation of home environment. She completed sit <> Stand from low soft surface couch with supervision She returned to room at end of session and left seated on bed with all needs in reach and bed alarm activated.  Educated throughout session regarding role of OT, POC, need for assist, DME, and d/c planning.   OT Evaluation Precautions/Restrictions  Precautions Precautions: Fall Precaution Comments: left eye ptosis Restrictions Weight Bearing Restrictions: No General Chart Reviewed: Yes Home Living/Prior Functioning Home Living Family/patient expects to be discharged to:: Private residence Living Arrangements: Alone Available Help at Discharge: Family, Friend(s) Type of Home: Apartment Home Access: Stairs to enter [employer] of Steps: 6 Entrance Stairs-Rails: Right, Left, Can reach both Home Layout: One level Bathroom Toilet: Standard Bathroom Accessibility: Yes  Lives With: Family,  Son Prior Function Level of Independence:  Independent with basic ADLs, Independent with homemaking with ambulation, Independent with gait  Able to Take Stairs?: Reciprically Driving: Yes 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   Cognition Overall Cognitive Status: Impaired/Different from baseline Arousal/Alertness: Awake/alert Orientation Level: Person;Situation;Place Person: Oriented Place: Oriented Situation: Oriented Year: 2018 Month: April Day of Week: Correct Memory: Impaired Memory Impairment: Decreased recall of new information Immediate Memory Recall: Sock;Blue;Bed Memory Recall: Sock;Blue;Bed Memory Recall Sock: Without Cue Memory Recall Blue: Without Cue Memory Recall Bed: Without Cue Awareness: Appears intact Problem Solving: Appears intact Behaviors: Impulsive Safety/Judgment: Impaired Comments: impulsive  with decreased safety awareness Sensation Sensation Light Touch: Appears Intact Proprioception: Appears Intact Coordination Gross Motor Movements are Fluid and Coordinated: No Fine Motor Movements are Fluid and Coordinated: Yes Coordination and Movement Description: mild decreased coordination in all 4 extremities.  Finger Nose Finger Test: decreased speed BUE, R>L Motor  Motor Motor: Hemiplegia Motor - Skilled Clinical Observations: L sided Hemiplegia and weakness Mobility  Bed Mobility Bed Mobility: Rolling Right;Rolling Left;Supine to Sit;Sit to Supine Rolling Right: 5: Supervision Rolling Right Details: Verbal cues for precautions/safety Rolling Left: 5:  Supervision Rolling Left Details: Verbal cues for precautions/safety Supine to Sit: 5: Supervision Supine to Sit Details: Verbal cues for precautions/safety Sit to Supine: 5: Supervision Sit to Supine - Details: Verbal cues for precautions/safety Transfers Sit to Stand: 4: Min assist;4: Min guard Sit to Stand Details: Verbal cues for precautions/safety  Trunk/Postural Assessment  Cervical Assessment Cervical Assessment: Within Functional Limits Thoracic Assessment Thoracic Assessment: Within Functional Limits Lumbar Assessment Lumbar Assessment: Exceptions to [facility] (posterior pelvic tilt ) Postural Control Postural Control: Deficits on evaluation (delayed in standing )  Balance Balance Balance Assessed: Yes Standardized Balance Assessment Standardized Balance Assessment: Berg Balance Test Berg Balance Test Sit to Stand: Able to stand  independently using hands Standing Unsupported: Able to stand safely 2 minutes Sitting with Back Unsupported but Feet Supported on Floor or Stool: Able to sit safely and securely 2 minutes Stand to Sit: Sits safely with minimal use of hands Transfers: Able to transfer safely, definite need of hands Standing Unsupported with Eyes Closed: Able to stand 10 seconds safely Standing Ubsupported with Feet Together: Able to place feet together independently and stand for 1 minute with supervision From Standing, Reach Forward with Outstretched Arm: Can reach confidently >25 cm (10") From Standing Position, Pick up Object from Floor: Able to pick up shoe, needs supervision From Standing Position, Turn to Look Behind Over each Shoulder: Turn sideways only but maintains balance Turn 360 Degrees: Needs close supervision or verbal cueing Standing Unsupported, Alternately Place Feet on Step/Stool: Able to complete 4 steps without aid or supervision Standing Unsupported, One Foot in Front: Needs help to step but can hold 15 seconds Standing on One Leg: Tries to lift  leg/unable to hold 3 seconds but remains standing independently Total Score: 39 Static Sitting Balance Static Sitting - Level of Assistance: 6: Modified independent (Device/Increase time) Dynamic Sitting Balance Dynamic Sitting - Level of Assistance: 6: Modified independent (Device/Increase time) Static Standing Balance Static Standing - Level of Assistance: 5: Stand by assistance Dynamic Standing Balance Dynamic Standing - Level of Assistance: 4: Min assist Extremity/Trunk Assessment RUE Assessment RUE  Assessment: Within Functional Limits LUE Assessment LUE Assessment: Within Functional Limits   See Function Navigator for Current Functional Status.   Refer to Care Plan for Long Term Goals  Recommendations for other services: None    Discharge Criteria: Patient will be discharged from OT if patient refuses treatment 3 consecutive times without medical reason, if treatment goals not met, if there is a change in medical status, if patient makes no progress towards goals or if patient is discharged from hospital.  The above assessment, treatment plan, treatment alternatives and goals were discussed and mutually agreed upon: by patient  Ernestina Patches 11/23/2016, 12:19 PM

## 2016-11-23 NOTE — IPOC Note (Signed)
Overall Plan of Care [facility]) Patient Details Name: Jennifer Jacobs MRN: [id number] DOB: 25-Oct-1945  Admitting Diagnosis: L MCA Aneurysm Clipping  Hospital Problems: Active Problems:   Aneurysm of posterior communicating artery   Gait disturbance, post-stroke   Cognitive deficit due to recent stroke   Aneurysm of anterior communicating artery     Functional Problem List: Nursing Endurance, Medication Management, Motor, Nutrition, Pain, Safety, Skin Integrity  PT Balance, Behavior, Endurance, Motor, Safety, Perception  OT Balance, Cognition, Endurance, Safety, Vision  SLP    TR         Basic ADL's: OT Grooming, Bathing, Dressing, Toileting     Advanced  ADL's: OT Simple Meal Preparation     Transfers: PT Bed Mobility, Bed to Chair, Car, Furniture, Floor  OT Toilet, Research scientist (life sciences): PT Ambulation, Psychologist, prison and probation services, Stairs     Additional Impairments: OT None  SLP        TR      Anticipated Outcomes Item Anticipated Outcome  Self Feeding Mod I  Swallowing      Basic self-care  Mod I  Toileting  Mod I   Bathroom Transfers Mod I  Bowel/Bladder  Continent to bowel and bladder with min. assist.  Transfers  Mod I with LRAD   Locomotion  Mod I with LRAD   Communication     Cognition     Pain  Less than 3,on 1 to 10 scale.  Safety/Judgment  Free from falls during her stay in rehab   Therapy Plan: PT Intensity: Minimum of 1-2 x/day ,45 to 90 minutes PT Frequency: 5 out of 7 days PT Duration Estimated Length of Stay: 6-9 OT Intensity: Minimum of 1-2 x/day, 45 to 90 minutes OT Frequency: 5 out of 7 days OT Duration/Estimated Length of Stay: 5-7 days         Team Interventions: Nursing Interventions Patient/Family Education, Skin Care/Wound Management, Disease Management/Prevention, Pain Management  PT interventions Ambulation/gait training, Balance/vestibular training, Cognitive remediation/compensation, Community reintegration, Discharge  planning, Disease management/prevention, DME/adaptive equipment instruction, Functional mobility training, Neuromuscular re-education, Patient/family education, Pain management, Psychosocial support, Splinting/orthotics, Skin care/wound management, Stair training, Therapeutic Activities, Therapeutic Exercise, UE/LE Strength taining/ROM, UE/LE Coordination activities, Visual/perceptual remediation/compensation, Wheelchair propulsion/positioning  OT Interventions Warden/ranger, Cognitive remediation/compensation, Discharge planning, Community reintegration, Fish farm manager, Functional mobility training, Pain management, Patient/family education, Self Care/advanced ADL retraining, Therapeutic Activities, Therapeutic Exercise, UE/LE Strength taining/ROM, Visual/perceptual remediation/compensation  SLP Interventions    TR Interventions    SW/CM Interventions Psychosocial Support, Restaurant manager, fast food, Discharge Planning    Team Discharge Planning: Destination: PT-Home ,OT- Home , SLP-  Projected Follow-up: PT-Home health PT, OT-  Home health OT, Outpatient OT, SLP-  Projected Equipment Needs: PT-To be determined, OT- Tub/shower bench, SLP-  Equipment Details: PT- , OT-  Patient/family involved in discharge planning: PT- Patient,  OT-Patient, SLP-   MD ELOS: 5-8 days. Medical Rehab Prognosis:  Good Assessment: 71 y.o.femalewith history of HTN, fibromyalgia, seizure disorder who was admitted via [facility] 11/17/16 with acute onset of headache, photophobia and blurry vision progressing BLE weakness and left CN III palsy. She was found to have left MCA aneurysm and left posterior communicating artery aneurysm. She underwent left crani for aneurysm clipping by Dr. Bevely Palmer. Post op had agitation due to morphine. CT head 4/3 with expected surgical changes and  showing aneurysm clips.  Therapy ongoing and patient limited by decrease in balance with impulsivity, STM deficits and  decreased awareness of deficits with poor safety.  Will set goals for Mod I with PT/OT.  See Team Conference Notes for weekly updates to the plan of care

## 2016-11-23 NOTE — Progress Notes (Signed)
Patient information reviewed and entered into eRehab system by Rahm Minix, RN, CRRN, PPS Coordinator.  Information including medical coding and functional independence measure will be reviewed and updated through discharge.     Per nursing patient was given "Data Collection Information Summary for Patients in Inpatient Rehabilitation Facilities with attached "Privacy Act Statement-Health Care Records" upon admission.  

## 2016-11-23 NOTE — H&P (Signed)
Subjective/Complaints: No issues overnite  ROS- no CP, SOB  Objective: Vital Signs: Blood pressure (!) 152/98, pulse 74, temperature 98.2 F (36.8 C), temperature source Oral, resp. rate 18, height '5\' 8"'  (1.727 m), weight 79.3 kg (174 lb 12.8 oz), SpO2 98 %. No results found. Results for orders placed or performed during the hospital encounter of 11/22/16 (from the past 72 hour(s))  Comprehensive metabolic panel     Status: Abnormal   Collection Time: 11/23/16  5:16 AM  Result Value Ref Range   Sodium 138 135 - 145 mmol/L   Potassium 4.2 3.5 - 5.1 mmol/L   Chloride 102 101 - 111 mmol/L   CO2 27 22 - 32 mmol/L   Glucose, Bld 118 (H) 65 - 99 mg/dL   BUN 16 6 - 20 mg/dL   Creatinine, Ser 0.84 0.44 - 1.00 mg/dL   Calcium 9.4 8.9 - 10.3 mg/dL   Total Protein 5.8 (L) 6.5 - 8.1 g/dL   Albumin 2.6 (L) 3.5 - 5.0 g/dL   AST 40 15 - 41 U/L   ALT 40 14 - 54 U/L   Alkaline Phosphatase 43 38 - 126 U/L   Total Bilirubin 0.4 0.3 - 1.2 mg/dL   GFR calc non Af Amer >60 >60 mL/min   GFR calc Af Amer >60 >60 mL/min    Comment: (NOTE) The eGFR has been calculated using the CKD EPI equation. This calculation has not been validated in all clinical situations. eGFR's persistently <60 mL/min signify possible Chronic Kidney Disease.    Anion gap 9 5 - 15  CBC WITH DIFFERENTIAL     Status: Abnormal   Collection Time: 11/23/16  5:16 AM  Result Value Ref Range   WBC 13.8 (H) 4.0 - 10.5 K/uL   RBC 3.26 (L) 3.87 - 5.11 MIL/uL   Hemoglobin 9.9 (L) 12.0 - 15.0 g/dL   HCT 29.8 (L) 36.0 - 46.0 %   MCV 91.4 78.0 - 100.0 fL   MCH 30.4 26.0 - 34.0 pg   MCHC 33.2 30.0 - 36.0 g/dL   RDW 13.8 11.5 - 15.5 %   Platelets 239 150 - 400 K/uL   Neutrophils Relative % 82 %   Neutro Abs 11.4 (H) 1.7 - 7.7 K/uL   Lymphocytes Relative 10 %   Lymphs Abs 1.4 0.7 - 4.0 K/uL   Monocytes Relative 8 %   Monocytes Absolute 1.1 (H) 0.1 - 1.0 K/uL   Eosinophils Relative 0 %   Eosinophils Absolute 0.0 0.0 - 0.7 K/uL   Basophils Relative 0 %   Basophils Absolute 0.0 0.0 - 0.1 K/uL     HEENT: Ptosis Left eye, unable to adduct pupil Cardio: RRR no rubs or murmur Resp: CTA B/L and unlabored GI: BS positive Extremity:  No Edema Skin:   Bruise left eyelid Neuro: Alert/Oriented, Cranial Nerve Abnormalities Left CN3, Normal Motor and Other impulsivity Musc/Skel:  Normal Gen NAD   Assessment/Plan: 1. Functional deficits secondary to Left MCA and PCA aneurysm which require 3+ hours per day of interdisciplinary therapy in a comprehensive inpatient rehab setting. Physiatrist is providing close team supervision and 24 hour management of active medical problems listed below. Physiatrist and rehab team continue to assess barriers to discharge/monitor patient progress toward functional and medical goals. FIM:                                 Medical Problem List and Plan: 1.  Gait disorder and cognitive deficits due to Left MCA and PCA aneurysm Initiate CIR PT, OT SLP  2.  DVT Prophylaxis/Anticoagulation: Mechanical: Sequential compression devices, below knee Bilateral lower extremities 3. Pain Management: Tylenol for HA 4. Mood: SW to monitor, support from team 5. Neuropsych: This patient is capable of making decisions on her own behalf. 6. Skin/Wound Care: Dry dressing to Left crani incision 7. Fluids/Electrolytes/Nutrition: monitor I/O , check labs in am 8. Seizure prophylaxis: On Keppra bid 9. HTN: Monitor BP bid--continue lisinopril, HCTZ and metoprolol, elevated this am, most values are in desired range no med changes for now Vitals:   11/22/16 1540 11/23/16 0511  BP: 120/82 (!) 152/98  Pulse: 71 74  Resp: 17 18  Temp: 98.7 F (37.1 C) 98.2 F (36.8 C)   10. Chronic back pain: Has some relief with Neurontin. 11. H/o depression: Mood stable on Lexapro and trazodone 12.  Leukocytosis due to steroid , afebrile 13.  ABLA- monitor 14.  L CN3 palsy natural tears LOS (Days) 1 A  FACE TO FACE EVALUATION WAS PERFORMED  KIRSTEINS,ANDREW E 11/23/2016, 7:35 AM

## 2016-11-23 NOTE — Op Note (Signed)
11/17/2016 - 11/19/2016  9:34 AM  PATIENT:  Jennifer Jacobs  71 y.o. 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  She was found to have a large left posterior communicating artery aneurysm as well as a small left middle cerebral artery aneurysm and right pcomm aneurysm.  I recommended the above procedure. Patient understood the risks, benefits, and alternatives and potential outcomes and wished to proceed.  PROCEDURE DETAILS: After smooth induction of general endotracheal anesthesia the patient was transferred to the operative table.  The head was fixated to the table using Mayfield pins.  The left frontotemporal area was clipped of hair and wiped with alcohol.  Lidocaine and marcaine with epinephrine was injected along the line of the planned incision.  The patient was then prepped and draped in the usual sterile fashion.    A timeout was performed.  A curvilinear frontotemporal incision was made and a musculocutaneous flap was reflected anteriorly.  A small pterional craniotomy was fashioned using a matchstick burr.  The lesser wing of the sphenoid to the level of the meningoorbital band as well as the orbital roof were drilled flat to enhance exposure.    The operating microscope was draped and brought into the field to provide light and magnification.  Utilizing microsurgical technique the dura was then opened in a curved fashion and reflected anteriorly.  The frontal lobe was elevated to identify the optic nerve entering the optic canal canal.   The arachnoid cisterns were widely opened and CSF was removed to produce brain relaxation.  The carotid artery was then identified.  Dissection of the sylvian fissure was performed using sharp dissection and bipolar cautery.  Attention was then turned to the carotid artery and the MCA, ACA, aneurysm and anterior choroidal artery were identified.  The neck of the aneurysm was dissected and the posterior communicating artery was identified.  A gently curved clip was applied across the neck of the aneurysm.  When the clip closed the aneurysm ruptured.  A second clip was placed further across the neck of the aneurysm.  This stopped the bleeding to the point that the aneurysm could be inspected.  It appeared that the dome which had been adherent to the tentorium was torn when the pressure from the clip pulled it away from the tentorium.  I placed a temporary clip on the carotid artery.  I further dissected the aneurysm free by opening the tentorium.  I then had a better view of the neck of the aneurysm.  I placed the first clip further across the neck of the aneurysm and then advanced the second clip further still.  By visual inspection they were across the neck of the aneurysm.  The carotid temporary clip was removed after seven minutes of temporary occlusion time.  There was no additional bleeding.  Doppler revealed arterial flow in the internal carotid, posterior communicating, anterior and middle cerebral arteries.    I then turned my attention to the lateral aspect of the sylvian fissure.  I continued my dissection until I identified the aneurysm at the trifurcation.  I identified the three vessels arising from the trifurcation and dissected them free.   Dissected the aneurysm neck and dome free as well.  I placed a temporary clip on the middle cerebral artery to soften the aneurysm.  I then used a straight fenestrated clip to occlude the aneurysm while sparing vessel most closely associated with it.  I removed  the temporary clip after one minute.  Doppler revealed arterial flow in the feeding middle cerebral and all three branches.  The aneurysm was inspected and found to be completely occluded by the clip.  I irrigated vigorously and obtained meticulous hemostasis.  Gelfoam was placed around the ICA.  Gelfoam with thrombin was layered in the sylvian fissure.  The bone flap was secured using titanium fixation plates.  Osteovation was used to repair the craniectomy defects.  Further irrigation was performed and the temporals and galea were closed with interrupted vicryl sutures.  The skin was closed with staples.  The patient was taken out of Mayfield pins.  She awoke from anesthesia without new apparent neurological deficits.  She is transferred to the ICU.  Counts were correct.  PATIENT DISPOSITION:  PACU - hemodynamically stable.   Delay start of Pharmacological VTE agent (>24hrs) due to surgical blood loss or risk of bleeding:  yes

## 2016-11-23 NOTE — Evaluation (Signed)
Physical Therapy Assessment and Plan  Patient Details  Name: Jennifer Jacobs MRN: [id number] Date of Birth: 1946-02-24  PT Diagnosis: Abnormality of gait, Hemiplegia dominant and Muscle weakness Rehab Potential: Excellent ELOS: 6-9   Today's Date: 11/23/2016 PT Individual Time: 6378-5885 PT Individual Time Calculation (min): 60 min    Problem List:  Patient Active Problem List   Diagnosis Date Noted  . Gait disturbance, post-stroke 11/22/2016  . Cognitive deficit due to recent stroke 11/22/2016  . Aneurysm of anterior communicating artery 11/22/2016  . Intracranial aneurysm   . Posterior communicating artery aneurysm   . Benign essential HTN   . Seizure disorder ([place])   . Acute blood loss anemia   . Leukocytosis   . Aneurysm ([place]) 11/18/2016  . 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  . Bilateral leg pain 06/26/2016  . AAA (abdominal aortic aneurysm) without rupture ([place]) 06/26/2016  . Insomnia 06/07/2015  . Urinary incontinence 06/07/2015  . Chronic pain syndrome 03/04/2015  . Fibromyalgia 03/04/2015  . Neck pain 03/04/2015  . Cervical radiculitis 03/04/2015  . Lower back pain 03/04/2015  . Lumbar radicular pain 03/04/2015  . Major depression 07/05/2014  . Alcohol abuse 07/05/2014  . Liver cyst 03/18/2014  . Claudication, class I ([place]) 03/18/2013  . Hypertension   . Smoker unmotivated to quit     Past Medical History:  Past Medical History:  Diagnosis Date  . Acute blood loss anemia   . Allergy   . Anxiety   . Anxiety and depression   . Arthritis   . Cognitive deficit due to recent stroke 11/22/2016  . Depression   . Dyslipidemia   . Fibromyalgia   . Hepatic cyst   . Hyperlipidemia   . Hypertension   . MI (myocardial infarction) 2007   By patient report, not substantiated by recent nuclear stress test.  . Neuromuscular disorder ([place])   . Seizure disorder ([place])    none since 2002  . Seizures ([place])   . Smoker unmotivated to quit    Quit  for 3 years and then restarted  . Substance abuse    ETOH  . Vision abnormalities    Past Surgical History:  Past Surgical History:  Procedure Laterality Date  . CRANIOTOMY Left 11/19/2016   Procedure: CRANIOTOMY INTRACRANIAL FOR  ANEURYSM CLIPPING;  Surgeon: Kevan Ny Ditty, MD;  Location: [place];  Service: Neurosurgery;  Laterality: Left;  . ELBOW ARTHROPLASTY Left    rods  . Exercise tolerance test  04/27/2013   CPET-MET: Submaximal effort (0.96, with goal of greater than 1.09) -- patient states that her effort was limited due to knee pain.  This makes the rest of the interpretation difficult: Peak VO2 - 10.5 (59% moderate), peak 02-pulse -  7..11 (low); heart rate 114 (73% -chronic incompetence considered);; PFTs normal   . MOUTH SURGERY    . NM MYOVIEW LTD  05/19/2013   LexiScan: EF 80%, no evidence of ischemia or infarction  . TRANSTHORACIC ECHOCARDIOGRAM  03/25/2013   EF 55-60%, no regional wall motion abnormalities; normal diastolic parameters, normal pulmonary pressures.  No valvular lesions noted.  Essentially normal echo     Assessment & Plan Clinical Impression: Patient is a 71 y.o.femalewith history of HTN, fibromyalgia, seizure disorder who was admitted via [facility] 11/17/16 with acute onset of headache, photophobia and blurry vision progressing BLE weakness and left CN III palsy. She was found to have left MCA aneurysm and left posterior communicating artery aneurysm. She underwent left crani for aneurysm  clipping by Dr. Cyndy Freeze. Post op had agitation due to morphine. CT head 4/3 with expected surgical changes and  showing aneurysm clips.  Therapy ongoing and patient limited by decrease in balance with impulsivity, STM deficits and decreased awareness of deficits with poor safety. Patient transferred to CIR on 11/22/2016 .   Patient currently requires min with mobility secondary to muscle weakness, decreased cardiorespiratoy endurance, unbalanced muscle activation and decreased  standing balance, decreased postural control, hemiplegia and decreased balance strategies.  Prior to hospitalization, patient was independent  with mobility and lived with Family, Son in a [place] home.  Home access is 6Stairs to enter.  Patient will benefit from skilled PT intervention to maximize safe functional mobility, minimize fall risk and decrease caregiver burden for planned discharge home with intermittent assist.  Anticipate patient will benefit from follow up [facility] at discharge.  PT - End of Session Activity Tolerance: Tolerates 30+ min activity with multiple rests Endurance Deficit: Yes PT Assessment Rehab Potential (ACUTE/IP ONLY): Excellent Barriers to Discharge: Inaccessible home environment;Decreased caregiver support PT Patient demonstrates impairments in the following area(s): Balance;Behavior;Endurance;Motor;Safety;Perception PT Transfers Functional Problem(s): Bed Mobility;Bed to Chair;Car;Furniture;Floor PT Locomotion Functional Problem(s): Ambulation;Wheelchair Mobility;Stairs PT Plan PT Intensity: Minimum of 1-2 x/day ,45 to 90 minutes PT Frequency: 5 out of 7 days PT Duration Estimated Length of Stay: 6-9 PT Treatment/Interventions: Ambulation/gait training;Balance/vestibular training;Cognitive remediation/compensation;Community reintegration;Discharge planning;Disease management/prevention;DME/adaptive equipment instruction;Functional mobility training;Neuromuscular re-education;Patient/family education;Pain management;Psychosocial support;Splinting/orthotics;Skin care/wound management;Stair training;Therapeutic Activities;Therapeutic Exercise;UE/LE Strength taining/ROM;UE/LE Coordination activities;Visual/perceptual remediation/compensation;Wheelchair propulsion/positioning PT Transfers Anticipated Outcome(s): Mod I with LRAD  PT Locomotion Anticipated Outcome(s): Mod I with LRAD  PT Recommendation Recommendations for Other Services: Therapeutic Recreation  consult Therapeutic Recreation Interventions: Kitchen group;Outing/community reintergration Follow Up Recommendations: Home health PT Patient destination: Home Equipment Recommended: To be determined  Skilled Therapeutic Intervention PT instructed patient in PT Evaluation and initiated treatment intervention; see below for results. PT educated patient in [place], rehab potential, rehab goals, and discharge recommendations. Pt required overall min assist for stair negotiation, gait on level and unlevel surface and transfers including basic transfers and car transfer as listed below. Supervision assist for bed mobility with cues for safety. PT instructed pt in Berg balance test; see below for results. Patient demonstrates increased fall risk as noted by score of   39/56 on Berg Balance Scale.  (<36= high risk for falls, close to 100%; 37-45 significant >80%; 46-51 moderate >50%; 52-55 lower >25%)  Session 2.  PT attempted to see pt for second treatment in PM. Pt in bed and reports that she is too tired for any more therapy on this day. PT educated pt on need for continual physical activity for long term improvement in endurance, but patient states that she cannot do anymore therapy today. PT will re-attempt at later time/date.     PT Evaluation Precautions/Restrictions   fall General   Vital SignsTherapy Vitals Temp: 98.2 F (36.8 C) Temp Source: Oral Pulse Rate: 74 Resp: 18 BP: (!) 152/98 (walked to bathroom) Patient Position (if appropriate): Lying Oxygen Therapy SpO2: 98 % O2 Device: Not Delivered Pain Pain Assessment Pain Assessment: 0-10 Pain Score: 4  Pain Location: Eye Pain Orientation: Left Home Living/Prior Functioning Home Living Available Help at Discharge: Family;Friend(s) Type of Home: Apartment Home Access: Stairs to enter Entrance Stairs-Number of Steps: 6 Entrance Stairs-Rails: Right;Left;Can reach both Home Layout: One level Biochemist, clinical: Standard Bathroom  Accessibility: Yes  Lives With: Family;Son Prior Function Level of Independence: Independent with basic ADLs;Independent with homemaking with ambulation;Independent with  gait  Able to Take Stairs?: Reciprically Driving: Yes 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   Cognition Orientation Level: Oriented to person;Oriented to place;Oriented to time;Oriented to situation Memory: Appears intact Awareness: Appears intact Problem Solving: Appears intact Behaviors: Impulsive Safety/Judgment: Impaired Comments: mild safety awareness deficits due to impulsiveity.  Sensation Sensation Light Touch: Appears Intact Proprioception: Appears Intact Coordination Gross Motor Movements are Fluid and Coordinated: No Fine Motor Movements are Fluid and Coordinated: Yes Coordination and Movement Description: mild decreased coordination in all 4 extremities.  Finger Nose Finger Test: decreased speed BUE, R>L Motor  Motor Motor: Hemiplegia Motor - Skilled Clinical Observations: L sided Hemiplegia and weakness  Mobility Bed Mobility Bed Mobility: Rolling Right;Rolling Left;Supine to Sit;Sit to Supine Rolling Right: 5: Supervision Rolling Right Details: Verbal cues for precautions/safety Rolling Left: 5: Supervision Rolling Left Details: Verbal cues for precautions/safety Supine to Sit: 5: Supervision Supine to Sit Details: Verbal cues for precautions/safety Sit to Supine: 5: Supervision Sit to Supine - Details: Verbal cues for precautions/safety Transfers Transfers: Yes Sit to Stand: 4: Min assist;4: Min guard Sit to Stand Details: Verbal cues for precautions/safety Stand Pivot Transfers: 4: Min guard;4: Min Psychologist, occupational Details: Verbal cues for precautions/safety Locomotion  Ambulation Ambulation:  Yes Ambulation/Gait Assistance: 4: Min assist Ambulation Distance (Feet): 150 Feet Assistive device: None Ambulation/Gait Assistance Details: Verbal cues for precautions/safety Gait Gait: Yes Gait Pattern: Step-through pattern;Poor foot clearance - left;Decreased trunk rotation;Left foot flat Stairs / Additional Locomotion Stairs: Yes Stairs Assistance: 4: Min assist Stair Management Technique: Two rails Number of Stairs: 16 Ramp: 4: Min assist Curb: 4: Min Administrator Mobility: No  Trunk/Postural Assessment  Cervical Assessment Cervical Assessment: Within Functional Limits Thoracic Assessment Thoracic Assessment: Within Functional Limits Lumbar Assessment Lumbar Assessment: Exceptions to [facility] (posterior pelvic tilt ) Postural Control Postural Control: Deficits on evaluation (delayed in standing )  Balance Balance Balance Assessed: Yes Standardized Balance Assessment Standardized Balance Assessment: Berg Balance Test Berg Balance Test Sit to Stand: Able to stand  independently using hands Standing Unsupported: Able to stand safely 2 minutes Sitting with Back Unsupported but Feet Supported on Floor or Stool: Able to sit safely and securely 2 minutes Stand to Sit: Sits safely with minimal use of hands Transfers: Able to transfer safely, definite need of hands Standing Unsupported with Eyes Closed: Able to stand 10 seconds safely Standing Ubsupported with Feet Together: Able to place feet together independently and stand for 1 minute with supervision From Standing, Reach Forward with Outstretched Arm: Can reach confidently >25 cm (10") From Standing Position, Pick up Object from Floor: Able to pick up shoe, needs supervision From Standing Position, Turn to Look Behind Over each Shoulder: Turn sideways only but maintains balance Turn 360 Degrees: Needs close supervision or verbal cueing Standing Unsupported, Alternately Place Feet on Step/Stool: Able to  complete 4 steps without aid or supervision Standing Unsupported, One Foot in Front: Needs help to step but can hold 15 seconds Standing on One Leg: Tries to lift leg/unable to hold 3 seconds but remains standing independently Total Score: 39 Static Sitting Balance Static Sitting - Level of Assistance: 6: Modified independent (Device/Increase time) Dynamic Sitting Balance Dynamic Sitting - Level of Assistance: 6: Modified independent (Device/Increase time) Static Standing Balance Static Standing - Level of Assistance: 5: Stand by assistance Dynamic Standing Balance Dynamic Standing -  Level of Assistance: 4: Min assist Extremity Assessment      RLE Assessment RLE Assessment: Within Functional Limits LLE Assessment LLE Assessment: Exceptions to WFL LLE AROM (degrees) LLE Overall AROM Comments: WFL LLE Strength LLE Overall Strength Comments: 4-/5 hip flexion and kne flexion. 4/5 knee extension, hip abduction/adduction   See Function Navigator for Current Functional Status.   Refer to Care Plan for Long Term Goals  Recommendations for other services: Therapeutic Recreation  Pet therapy and Outing/community reintegration  Discharge Criteria: Patient will be discharged from PT if patient refuses treatment 3 consecutive times without medical reason, if treatment goals not met, if there is a change in medical status, if patient makes no progress towards goals or if patient is discharged from hospital.  The above assessment, treatment plan, treatment alternatives and goals were discussed and mutually agreed upon: by patient  Lorie Phenix 11/23/2016, 9:01 AM

## 2016-11-23 NOTE — Progress Notes (Signed)
Social Work Assessment and Plan Social Work Assessment and Plan  Patient Details  Name: Jennifer Jacobs MRN: [id number] Date of Birth: 1946/07/20  Today's Date: 11/23/2016  Problem List:  Patient Active Problem List   Diagnosis Date Noted  . Gait disturbance, post-stroke 11/22/2016  . Cognitive deficit due to recent stroke 11/22/2016  . Aneurysm of anterior communicating artery 11/22/2016  . Intracranial aneurysm   . Posterior communicating artery aneurysm   . Benign essential HTN   . Seizure disorder ([place])   . Acute blood loss anemia   . Leukocytosis   . Aneurysm ([place]) 11/18/2016  . 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  . Bilateral leg pain 06/26/2016  . AAA (abdominal aortic aneurysm) without rupture ([place]) 06/26/2016  . Insomnia 06/07/2015  . Urinary incontinence 06/07/2015  . Chronic pain syndrome 03/04/2015  . Fibromyalgia 03/04/2015  . Neck pain 03/04/2015  . Cervical radiculitis 03/04/2015  . Lower back pain 03/04/2015  . Lumbar radicular pain 03/04/2015  . Major depression 07/05/2014  . Alcohol abuse 07/05/2014  . Liver cyst 03/18/2014  . Claudication, class I ([place]) 03/18/2013  . Hypertension   . Smoker unmotivated to quit    Past Medical History:  Past Medical History:  Diagnosis Date  . Acute blood loss anemia   . Allergy   . Anxiety   . Anxiety and depression   . Arthritis   . Cognitive deficit due to recent stroke 11/22/2016  . Depression   . Dyslipidemia   . Fibromyalgia   . Hepatic cyst   . Hyperlipidemia   . Hypertension   . MI (myocardial infarction) 2007   By patient report, not substantiated by recent nuclear stress test.  . Neuromuscular disorder ([place])   . Seizure disorder ([place])    none since 2002  . Seizures ([place])   . Smoker unmotivated to quit    Quit for 3 years and then restarted  . Substance abuse    ETOH  . Vision abnormalities    Past Surgical History:  Past Surgical History:  Procedure Laterality Date  .  CRANIOTOMY Left 11/19/2016   Procedure: CRANIOTOMY INTRACRANIAL FOR  ANEURYSM CLIPPING;  Surgeon: Kevan Ny Ditty, MD;  Location: [facility];  Service: Neurosurgery;  Laterality: Left;  . ELBOW ARTHROPLASTY Left    rods  . Exercise tolerance test  04/27/2013   CPET-MET: Submaximal effort (0.96, with goal of greater than 1.09) -- patient states that her effort was limited due to knee pain.  This makes the rest of the interpretation difficult: Peak VO2 - 10.5 (59% moderate), peak 02-pulse -  7..11 (low); heart rate 114 (73% -chronic incompetence considered);; PFTs normal   . MOUTH SURGERY    . NM MYOVIEW LTD  05/19/2013   LexiScan: EF 80%, no evidence of ischemia or infarction  . TRANSTHORACIC ECHOCARDIOGRAM  03/25/2013   EF 55-60%, no regional wall motion abnormalities; normal diastolic parameters, normal pulmonary pressures.  No valvular lesions noted.  Essentially normal echo    Social History:  reports that she has been smoking Cigarettes.  She has been smoking about 0.10 packs per day. She has never used smokeless tobacco. She reports that she uses drugs, including Marijuana, about 2 times per week. She reports that she does not drink alcohol.  Family / Support Systems Marital Status: Separated Patient Roles: Parent, Other (Comment) (employee) Children: Thayer Jew Cummings-son [phone] Two daughter's Other Supports: Patria Mane [phone]  [phone] Anticipated Caregiver: Son and his fiance Ability/Limitations of Caregiver: Son plans to  work on a plan, he is new in his job and can not take much time off. Pt's sister to assist also Caregiver Availability: Other (Comment) (Coming up with a 24 hr supervision plan for a short time) Family Dynamics: Close with children and sister's, she has good support and they are willing to assist her at discharge. 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 Pt is doing well on her first day.  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 She will go to son's apartment and then home as soon possible. She feels her activity has helped her be so independent. Recent Psychosocial Issues: was doing well prior to admission here Pyschiatric History: history of depression was taking medicine for and stopped felt not needed. Will ask neruo-psych to see while here due to could be high risk for depression due to condition. She is able to verbalize her concerns and feelings. Substance Abuse History: history of ETOH this was in the past, feels no issues  Patient / Family Perceptions, Expectations & Goals Pt/Family understanding of illness & functional limitations: Pt and son have a good understanding of her anneurysm clipping and her eye issue now as a result of it. Son has been here daily and talking with the MD's pt realizes her meomory has been affected and is working on this. Premorbid pt/family roles/activities: Mom, sibling, employee, church member, etc Anticipated changes in roles/activities/participation: resume Pt/family expectations/goals: Pt states: " I want to get back to work and driving that is my goal."  Son states: " She is moving too fast needs to slow down, we will make sure  she has what she needs."  [employer]: None Premorbid Home Care/DME Agencies: None Transportation available at discharge: Family members Resource referrals recommended: Neuropsychology, Support group (specify)  Discharge Planning Living Arrangements: Alone Support Systems: Children, Other relatives, Friends/neighbors, Social worker community Type of Residence: Private residence Insurance Resources: Chartered certified accountant Resources: Employment, Radio broadcast assistant Screen Referred: No Living Expenses: Education officer, community Management: Patient Does the patient have any problems obtaining your medications?: No Home Management: Patient Patient/Family Preliminary Plans: Pt plans to go to son's apartment when she is discharged from rehab. Son and finance are working on a plan for her, since both work. Son has started a new job and can't take off much time. Pt appears to be doing well and will not be here long due to high level. Social Work Anticipated Follow Up Needs: HH/OP, Support Group  Clinical Impression Pleasant female who is very active and was still working prior to admission and plans to go back to her [employer] job when able. Supportive family who will make sure she has what she needs at discharge. Son will try to piece together Care for home between himself, finance and her sister's. Will await therapy team evaluations and let son  know as soon as possible target discharge date, for notice for his work. Would benefit from neuro-psych seeing for coping while here and with her history of depression.  Elease Hashimoto 11/23/2016, 10:56 AM

## 2016-11-24 ENCOUNTER — Inpatient Hospital Stay (HOSPITAL_COMMUNITY): Payer: Medicare Other | Admitting: Occupational Therapy

## 2016-11-24 ENCOUNTER — Inpatient Hospital Stay (HOSPITAL_COMMUNITY): Payer: Self-pay | Admitting: Physical Therapy

## 2016-11-24 DIAGNOSIS — Z298 Encounter for other specified prophylactic measures: Secondary | ICD-10-CM

## 2016-11-24 DIAGNOSIS — I10 Essential (primary) hypertension: Secondary | ICD-10-CM

## 2016-11-24 DIAGNOSIS — H4902 Third [oculomotor] nerve palsy, left eye: Secondary | ICD-10-CM

## 2016-11-24 MED ORDER — LISINOPRIL 20 MG PO TABS
30.0000 mg | ORAL_TABLET | Freq: Every day | ORAL | Status: DC
  Administered 2016-11-24 – 2016-11-27 (×4): 30 mg via ORAL
  Filled 2016-11-24 (×4): qty 1

## 2016-11-24 NOTE — Progress Notes (Signed)
Physical Therapy Session Note  Patient Details  Name: Jennifer Jacobs MRN: [id number] Date of Birth: [birth year]  Today's Date: 11/24/2016 PT Individual Time: 4235-3614  AND 1600-1700 PT Individual Time Calculation (min): 41 min   Short Term Goals: Week 1:  PT Short Term Goal 1 (Week 1): STG =LTg due to ELOS  Skilled Therapeutic Interventions/Progress Updates:   Pt received supine in bed and agreeable to PT. Supine>sit transfer without assist cues, but increased time and effort.   PT treatment focused on functional gait and transfer with SPC. PT instructed pt in 3 point gait pattern for 2 bouts of gait of 234f with supervision assist and additional 579f   tranfers completed with supervision assist from PT including toilet transfer.   Pt returned to room and performed stand pivot transfer to bed with supervision. Sit>supine completed without assist from PT and left supine in bed with call bell in reach and all needs met.    Session 2:  Pt received sitting EOB and agreeable to PT. Gait training throughout hall in hospital with [facility] supervision assist and moderate cues for 3 point gait pattern. Additional gait training without AD x [id number] supervision assist from PT.   PT instructed pt in Nustep reciprocal movement movement training levl 6 x 7 minutes. Min cues for decreased speed to <60 steps per min to prevent excess fatigue.   PT instructed pt in Modified Stroop test, seated average of 3 trials: 24 sec. Standing average of 3 trials, 27 seconds. Walking average of 2 trails: 28 seconds.   Standing balance training on 6 inch wedge, to build pipe tree x 2. Min assist progressing to superivsion assist for safety in stance and min cues for problem solving.   Pt returned to room and performed ambulatory transfer to bed with supervision assist. Sit>supine completed without assist, and pt left supine in bed with call bell in reach and all needs met.          Therapy  Documentation Precautions:  Precautions Precautions: Fall Precaution Comments: left eye ptosis Restrictions Weight Bearing Restrictions: No Vital Signs: Therapy Vitals Temp: 98.2 F (36.8 C) Temp Source: Oral Pulse Rate: 67 Resp: 16 BP: (!) 145/87 Patient Position (if appropriate): Sitting Oxygen Therapy SpO2: 100 % O2 Device: Not Delivered Pain: 0/10   See Function Navigator for Current Functional Status.   Therapy/Group: Individual Therapy  AustLorie Phenix/2018, 8:50 AM

## 2016-11-24 NOTE — Progress Notes (Signed)
Subjective/Complaints: Pt seen laying in bed this AM.  She slept slept well overnight.  She notes headaches yesterday that limited therapies, but they have improved today.   ROS- Denies CP, SOB, N/V/D.  Objective: Vital Signs: Blood pressure (!) 145/87, pulse 67, temperature 98.2 F (36.8 C), temperature source Oral, resp. rate 16, height 5' 8" (1.727 m), weight 79.3 kg (174 lb 12.8 oz), SpO2 100 %. No results found. Results for orders placed or performed during the hospital encounter of 11/22/16 (from the past 72 hour(s))  Comprehensive metabolic panel     Status: Abnormal   Collection Time: 11/23/16  5:16 AM  Result Value Ref Range   Sodium 138 135 - 145 mmol/L   Potassium 4.2 3.5 - 5.1 mmol/L   Chloride 102 101 - 111 mmol/L   CO2 27 22 - 32 mmol/L   Glucose, Bld 118 (H) 65 - 99 mg/dL   BUN 16 6 - 20 mg/dL   Creatinine, Ser 0.84 0.44 - 1.00 mg/dL   Calcium 9.4 8.9 - 10.3 mg/dL   Total Protein 5.8 (L) 6.5 - 8.1 g/dL   Albumin 2.6 (L) 3.5 - 5.0 g/dL   AST 40 15 - 41 U/L   ALT 40 14 - 54 U/L   Alkaline Phosphatase 43 38 - 126 U/L   Total Bilirubin 0.4 0.3 - 1.2 mg/dL   GFR calc non Af Amer >60 >60 mL/min   GFR calc Af Amer >60 >60 mL/min    Comment: (NOTE) The eGFR has been calculated using the CKD EPI equation. This calculation has not been validated in all clinical situations. eGFR's persistently <60 mL/min signify possible Chronic Kidney Disease.    Anion gap 9 5 - 15  CBC WITH DIFFERENTIAL     Status: Abnormal   Collection Time: 11/23/16  5:16 AM  Result Value Ref Range   WBC 13.8 (H) 4.0 - 10.5 K/uL   RBC 3.26 (L) 3.87 - 5.11 MIL/uL   Hemoglobin 9.9 (L) 12.0 - 15.0 g/dL   HCT 29.8 (L) 36.0 - 46.0 %   MCV 91.4 78.0 - 100.0 fL   MCH 30.4 26.0 - 34.0 pg   MCHC 33.2 30.0 - 36.0 g/dL   RDW 13.8 11.5 - 15.5 %   Platelets 239 150 - 400 K/uL   Neutrophils Relative % 82 %   Neutro Abs 11.4 (H) 1.7 - 7.7 K/uL   Lymphocytes Relative 10 %   Lymphs Abs 1.4 0.7 - 4.0 K/uL   Monocytes Relative 8 %   Monocytes Absolute 1.1 (H) 0.1 - 1.0 K/uL   Eosinophils Relative 0 %   Eosinophils Absolute 0.0 0.0 - 0.7 K/uL   Basophils Relative 0 %   Basophils Absolute 0.0 0.0 - 0.1 K/uL     HEENT: Ptosis Left eye, unable to adduct pupil Cardio: RRR no JVD. Resp: CTA B/L and Unlabored GI: BS positive. Soft Skin:   Bruise left eyelid Neuro: Alert/Oriented, Cranial Nerve Abnormalities Left CN3, Motor: RUE/RLE: 5/5 LUE/LLE: 4+/5 Musc/Skel:  No edema. No tenderness. Gen NAD. Vital signs reviewed.    Assessment/Plan: 1. Functional deficits secondary to Left MCA and PCA aneurysm which require 3+ hours per day of interdisciplinary therapy in a comprehensive inpatient rehab setting. Physiatrist is providing close team supervision and 24 hour management of active medical problems listed below. Physiatrist and rehab team continue to assess barriers to discharge/monitor patient progress toward functional and medical goals. FIM: Function - Bathing Position: Shower Body parts bathed by patient: Right  arm, Right upper leg, Left arm, Left upper leg, Chest, Right lower leg, Abdomen, Front perineal area, Left lower leg, Buttocks Body parts bathed by helper: Back Assist Level: Touching or steadying assistance(Pt > 75%)  Function- Upper Body Dressing/Undressing What is the patient wearing?: Pull over shirt/dress Pull over shirt/dress - Perfomed by patient: Pull shirt over trunk Assist Level: Set up, Supervision or verbal cues Set up : To obtain clothing/put away Function - Lower Body Dressing/Undressing What is the patient wearing?: Non-skid slipper socks, Hospital Gown Position: Other (comment) (Toilet) Non-skid slipper socks- Performed by patient: Don/doff right sock, Don/doff left sock Assist for footwear: Supervision/touching assist  Function - Toileting Toileting steps completed by patient: Adjust clothing prior to toileting, Performs perineal hygiene, Adjust clothing after  toileting Toileting Assistive Devices: Grab bar or rail Assist level: Touching or steadying assistance (Pt.75%)  Function - Air cabin crew transfer assistive device: Bedside commode Assist level to toilet: Touching or steadying assistance (Pt > 75%) Assist level from toilet: Touching or steadying assistance (Pt > 75%)  Function - Chair/bed transfer Chair/bed transfer method: Stand pivot Chair/bed transfer assist level: Touching or steadying assistance (Pt > 75%)  Function - Locomotion: Wheelchair Will patient use wheelchair at discharge?: No Wheelchair activity did not occur: N/A Wheel 50 feet with 2 turns activity did not occur: N/A Wheel 150 feet activity did not occur: N/A Function - Locomotion: Ambulation Assistive device: No device Max distance: 126f  Assist level: Touching or steadying assistance (Pt > 75%) Assist level: Touching or steadying assistance (Pt > 75%) Assist level: Touching or steadying assistance (Pt > 75%) Assist level: Touching or steadying assistance (Pt > 75%) Assist level: Touching or steadying assistance (Pt > 75%)  Function - Comprehension Comprehension: Auditory Comprehension assist level: Understands complex 90% of the time/cues 10% of the time, Understands basic 75 - 89% of the time/ requires cueing 10 - 24% of the time  Function - Expression Expression: Verbal Expression assist level: Expresses basic 75 - 89% of the time/requires cueing 10 - 24% of the time. Needs helper to occlude trach/needs to repeat words.  Function - Social Interaction Social Interaction assist level: Interacts appropriately 90% of the time - Needs monitoring or encouragement for participation or interaction.  Function - Problem Solving Problem solving assist level: Solves basic 90% of the time/requires cueing < 10% of the time  Function - Memory Memory assist level: Recognizes or recalls 75 - 89% of the time/requires cueing 10 - 24% of the time Patient normally  able to recall (first 3 days only): Current season, Staff names and faces, That he or she is in a hospital Medical Problem List and Plan: 1.  Gait disorder and cognitive deficits due to Left MCA and PCA aneurysm  Cont CIR PT, OT SLP  2.  DVT Prophylaxis/Anticoagulation: Mechanical: Sequential compression devices, below knee Bilateral lower extremities 3. Pain Management: Tylenol for HA 4. Mood: SW to monitor, support from team 5. Neuropsych: This patient is capable of making decisions on her own behalf. 6. Skin/Wound Care: Dry dressing to Left crani incision 7. Fluids/Electrolytes/Nutrition: monitor I/Os 8. Seizure prophylaxis: On Keppra bid 9. HTN: Monitor BP bid--continue HCTZ and metoprolol  Lisinopril increased to 356mon 4/7 for improved control 10. Chronic back pain: Has some relief with Neurontin. 11. H/o depression: Mood stable on Lexapro and trazodone 12.  Leukocytosis due to steroid , afebrile 13.  ABLA- monitor 14.  L CN3 palsy natural tears  LOS (Days) 2 A FACE TO  FACE EVALUATION WAS PERFORMED  Ankit Anil Patel 11/24/2016, 6:56 AM   

## 2016-11-24 NOTE — Progress Notes (Signed)
Occupational Therapy Session Note  Patient Details  Name: Jennifer Jacobs MRN: [id number] Date of Birth: 1945/08/30  Today's Date: 11/24/2016 OT Individual Time: 0930-1057 OT Individual Time Calculation (min): 87 min    Short Term Goals: Week 1:  OT Short Term Goal 1 (Week 1): STG=LTG due to LOS  Skilled Therapeutic Interventions/Progress Updates:    Upon entering the room, pt supine in bed with no c/o pain and agreeable to OT intervention. Pt performed supine >sit with overall supervision. Pt ambulating with SPC and supervision with min verbal cues for technique 75' to tub room for shower. Pt performed transfer onto TTB with steady assistance. Pt engaged in bathing from shower in tub room to simulate home environment. Pt required supervision and min verbal cues for safety. Pt standing in shower with grab bars to wash buttocks. OT suggested lateral lean but pt declined and continued standing for task. Pt dressed from edge of TTB with set up A to obtain items and steady assistance for balance with LB clothing management. Pt ambulated back to room in same manner as above. OT provided pt with paper handout regarding energy conservation for self care tasks. OT educating pt on this topic but education to continue. Pt returning to bed secondary to fatigue. Call bell and all needed items within reach. Bed alarm activated upon exiting the room.   Therapy Documentation Precautions:  Precautions Precautions: Fall Precaution Comments: left eye ptosis Restrictions Weight Bearing Restrictions: No  See Function Navigator for Current Functional Status.   Therapy/Group: Individual Therapy  Alen Bleacher 11/24/2016, 12:48 PM

## 2016-11-25 ENCOUNTER — Inpatient Hospital Stay (HOSPITAL_COMMUNITY): Payer: Self-pay

## 2016-11-25 DIAGNOSIS — G441 Vascular headache, not elsewhere classified: Secondary | ICD-10-CM

## 2016-11-25 NOTE — Progress Notes (Signed)
Occupational Therapy Session Note  Patient Details  Name: Jennifer Jacobs MRN: [id number] Date of Birth: [birth year]  Today's Date: 11/25/2016 OT Individual Time: 1000-1100 OT Individual Time Calculation (min): 60 min    Short Term Goals: Week 1:  OT Short Term Goal 1 (Week 1): STG=LTG due to LOS  Skilled Therapeutic Interventions/Progress Updates:   1:1. No complaint of pain. Pt agreeable to bathe and dress. Pt ambulate with supervision to/from tub room to toilet/bathe and simulate home environment. Pt ambulates to toilet and voids bowel and bladder with supervision. Pt transfers onto tub bench with supervision. Pt bathes 10/10 body parts with set up while seated on TTB. OT recommend pt lean laterally to wash buttocks if trying to conserve energy, however pt decides to stand d/t energy level. Pt completes LB/UB dressing with supervision/set up with VC for safety awareness for sit to stand from TTB. Pt ambulates back to room to brush teeth in standing with supervision. Pt mentions having difficulty with texting, calling and seeing weather notifications on phone d/t visual deficits. OT sets up accessibility setting to increase font size on text, install OK Google feature, and educate pt on how to use speech to text. Pt successfully sends 2 messages to daughter. Pt now able to write e-mail, place phone calls as needed for to contact physician and find information on the Internet. Exited session with pt supine in bed with call light in reach, bed exit alarm on, and all needs met.  Therapy Documentation Precautions:  Precautions Precautions: Fall Precaution Comments: left eye ptosis Restrictions Weight Bearing Restrictions: No  See Function Navigator for Current Functional Status.   Therapy/Group: Individual Therapy  Tonny Branch 11/25/2016, 10:21 AM

## 2016-11-25 NOTE — Progress Notes (Signed)
Subjective/Complaints: Pt seen laying in bed this AM.  She slept well overnight. She asks for help ordering breakfast.    ROS: Denies CP, SOB, N/V/D.  Objective: Vital Signs: Blood pressure (!) 144/82, pulse 67, temperature 98.4 F (36.9 C), temperature source Oral, resp. rate 18, height '5\' 8"'  (1.727 m), weight 79.3 kg (174 lb 12.8 oz), SpO2 97 %. No results found. Results for orders placed or performed during the hospital encounter of 11/22/16 (from the past 72 hour(s))  Comprehensive metabolic panel     Status: Abnormal   Collection Time: 11/23/16  5:16 AM  Result Value Ref Range   Sodium 138 135 - 145 mmol/L   Potassium 4.2 3.5 - 5.1 mmol/L   Chloride 102 101 - 111 mmol/L   CO2 27 22 - 32 mmol/L   Glucose, Bld 118 (H) 65 - 99 mg/dL   BUN 16 6 - 20 mg/dL   Creatinine, Ser 0.84 0.44 - 1.00 mg/dL   Calcium 9.4 8.9 - 10.3 mg/dL   Total Protein 5.8 (L) 6.5 - 8.1 g/dL   Albumin 2.6 (L) 3.5 - 5.0 g/dL   AST 40 15 - 41 U/L   ALT 40 14 - 54 U/L   Alkaline Phosphatase 43 38 - 126 U/L   Total Bilirubin 0.4 0.3 - 1.2 mg/dL   GFR calc non Af Amer >60 >60 mL/min   GFR calc Af Amer >60 >60 mL/min    Comment: (NOTE) The eGFR has been calculated using the CKD EPI equation. This calculation has not been validated in all clinical situations. eGFR's persistently <60 mL/min signify possible Chronic Kidney Disease.    Anion gap 9 5 - 15  CBC WITH DIFFERENTIAL     Status: Abnormal   Collection Time: 11/23/16  5:16 AM  Result Value Ref Range   WBC 13.8 (H) 4.0 - 10.5 K/uL   RBC 3.26 (L) 3.87 - 5.11 MIL/uL   Hemoglobin 9.9 (L) 12.0 - 15.0 g/dL   HCT 29.8 (L) 36.0 - 46.0 %   MCV 91.4 78.0 - 100.0 fL   MCH 30.4 26.0 - 34.0 pg   MCHC 33.2 30.0 - 36.0 g/dL   RDW 13.8 11.5 - 15.5 %   Platelets 239 150 - 400 K/uL   Neutrophils Relative % 82 %   Neutro Abs 11.4 (H) 1.7 - 7.7 K/uL   Lymphocytes Relative 10 %   Lymphs Abs 1.4 0.7 - 4.0 K/uL   Monocytes Relative 8 %   Monocytes Absolute 1.1 (H)  0.1 - 1.0 K/uL   Eosinophils Relative 0 %   Eosinophils Absolute 0.0 0.0 - 0.7 K/uL   Basophils Relative 0 %   Basophils Absolute 0.0 0.0 - 0.1 K/uL     HEENT: Ptosis Left eye, unable to adduct pupil Cardio: RRR. No JVD. Resp: CTA B/L and Unlabored GI: BS positive. Soft Skin:   Bruise left eyelid Neuro: Alert/Oriented, Cranial Nerve Abnormalities Left CN3, Motor: RUE/RLE: 5/5 LUE/LLE: 4+/5 Musc/Skel:  No edema. No tenderness. Gen NAD. Vital signs reviewed.    Assessment/Plan: 1. Functional deficits secondary to Left MCA and PCA aneurysm which require 3+ hours per day of interdisciplinary therapy in a comprehensive inpatient rehab setting. Physiatrist is providing close team supervision and 24 hour management of active medical problems listed below. Physiatrist and rehab team continue to assess barriers to discharge/monitor patient progress toward functional and medical goals. FIM: Function - Bathing Position: Shower Body parts bathed by patient: Right arm, Right upper leg, Left arm,  Left upper leg, Chest, Right lower leg, Abdomen, Front perineal area, Left lower leg, Buttocks Body parts bathed by helper: Back Assist Level: Supervision or verbal cues  Function- Upper Body Dressing/Undressing What is the patient wearing?: Pull over shirt/dress Pull over shirt/dress - Perfomed by patient: Pull shirt over trunk, Thread/unthread right sleeve, Thread/unthread left sleeve, Put head through opening Assist Level: Set up, Supervision or verbal cues Set up : To obtain clothing/put away Function - Lower Body Dressing/Undressing What is the patient wearing?: Non-skid slipper socks, Pants Position: Other (comment) (Toilet) Pants- Performed by patient: Thread/unthread right pants leg, Thread/unthread left pants leg, Pull pants up/down Non-skid slipper socks- Performed by patient: Don/doff right sock, Don/doff left sock Assist for footwear: Supervision/touching assist Assist for lower body  dressing: Touching or steadying assistance (Pt > 75%)  Function - Toileting Toileting steps completed by patient: Adjust clothing prior to toileting, Performs perineal hygiene, Adjust clothing after toileting Toileting Assistive Devices: Grab bar or rail Assist level: Touching or steadying assistance (Pt.75%)  Function - Air cabin crew transfer assistive device: Elevated toilet seat/BSC over toilet, Walker Assist level to toilet: Touching or steadying assistance (Pt > 75%) Assist level from toilet: Touching or steadying assistance (Pt > 75%)  Function - Chair/bed transfer Chair/bed transfer method: Stand pivot Chair/bed transfer assist level: Supervision or verbal cues  Function - Locomotion: Wheelchair Will patient use wheelchair at discharge?: No Wheelchair activity did not occur: N/A Wheel 50 feet with 2 turns activity did not occur: N/A Wheel 150 feet activity did not occur: N/A Function - Locomotion: Ambulation Assistive device: Cane-straight Max distance: 75 Assist level: Supervision or verbal cues Assist level: Supervision or verbal cues Assist level: Supervision or verbal cues Assist level: Supervision or verbal cues Assist level: Touching or steadying assistance (Pt > 75%)  Function - Comprehension Comprehension: Auditory Comprehension assist level: Follows basic conversation/direction with no assist  Function - Expression Expression: Verbal Expression assist level: Expresses basic needs/ideas: With no assist  Function - Social Interaction Social Interaction assist level: Interacts appropriately 90% of the time - Needs monitoring or encouragement for participation or interaction.  Function - Problem Solving Problem solving assist level: Solves basic problems with no assist  Function - Memory Memory assist level: Recognizes or recalls 90% of the time/requires cueing < 10% of the time Patient normally able to recall (first 3 days only): Current season,  Staff names and faces, That he or she is in a hospital Medical Problem List and Plan: 1.  Gait disorder and cognitive deficits due to Left MCA and PCA aneurysm  Cont CIR  2.  DVT Prophylaxis/Anticoagulation: Mechanical: Sequential compression devices, below knee Bilateral lower extremities 3. Pain Management: Tylenol for HA, controlled at present 4. Mood: SW to monitor, support from team 5. Neuropsych: This patient is capable of making decisions on her own behalf. 6. Skin/Wound Care: Dry dressing to Left crani incision 7. Fluids/Electrolytes/Nutrition: monitor I/Os 8. Seizure prophylaxis: On Keppra bid 9. HTN: Monitor BP bid--continue HCTZ and metoprolol  Lisinopril increased to 66m on 4/7 for improved control  May need further increase tomorrow 10. Chronic back pain: Has some relief with Neurontin. 11. H/o depression: Mood stable on Lexapro and trazodone 12.  Leukocytosis due to steroid , afebrile 13.  ABLA- monitor 14.  L CN3 palsy: natural tears  LOS (Days) 3 A FACE TO FACE EVALUATION WAS PERFORMED  Emery Binz ALorie Phenix4/03/2017, 6:55 AM

## 2016-11-26 ENCOUNTER — Inpatient Hospital Stay (HOSPITAL_COMMUNITY): Payer: Medicare Other

## 2016-11-26 ENCOUNTER — Inpatient Hospital Stay (HOSPITAL_COMMUNITY): Payer: Medicare Other | Admitting: Occupational Therapy

## 2016-11-26 MED ORDER — MUSCLE RUB 10-15 % EX CREA
TOPICAL_CREAM | CUTANEOUS | Status: DC | PRN
  Administered 2016-11-26: 01:00:00 via TOPICAL
  Filled 2016-11-26: qty 85

## 2016-11-26 MED ORDER — SENNOSIDES-DOCUSATE SODIUM 8.6-50 MG PO TABS: 2.0000 | ORAL_TABLET | Freq: Two times a day (BID) | ORAL | Status: DC

## 2016-11-26 MED ORDER — SENNOSIDES-DOCUSATE SODIUM 8.6-50 MG PO TABS: 2.0000 | ORAL_TABLET | Freq: Every day | ORAL | Status: DC

## 2016-11-26 NOTE — Progress Notes (Signed)
Occupational Therapy Session Note  Patient Details  Name: Jennifer Jacobs MRN: [id number] Date of Birth: 1946-02-08  Today's Date: 11/26/2016 OT Individual Time: 1100-1200 and 1415-1455 OT Individual Time Calculation (min): 60 min and 40 min   Short Term Goals: Week 1:  OT Short Term Goal 1 (Week 1): STG=LTG due to LOS  Skilled Therapeutic Interventions/Progress Updates:    Session One: PT seen for OT ADL bathing/dressing session. Pt in supine upon arrival, agreeable to tx session.  She ambulated throughout session with supervision. She gathered clothing items, bending over to low drawer to obtain clothing items with supervision She bathed seated on shower chair, standing with use of grab bars to complete peri-care/ buttock hygiene. She dressed seated on toilet supervision- mod I level. She cleaned towels from bathroom with supervision and then completed grooming tasks standing at sink. She ambulated to ADL apartment and completed bed making activity at supervision- mod I level. Required VC for awareness of self and distance btwn objects due to impaired vision.  She returned to room and set-up eating lunch on EOB with all needs in reach and bed alarm on. Pt approaching mod I level goals, have made CSW aware and prep for d/c to son's home this week.   Session Two: Pt seen for OT session focusing on IADL re-training, functional mobility and endurance. Pt in supine upon arrival, agreeable to tx session and denying pain. She ambulated throughout session without AD and supervision. Completed simple meal prep task at stove level with supervision, reaching down to obtain items from low cabinet and transporting items throughout kitchen during task. She displayed good safety awareness throughout cooking task independently. Pt then ambulated off unit to hospital gift store, pt able to navigate throughout gift store with supervision and 1 VC for environmental awareness to store displays placed on floor.   Pt requested to return to room to complete toileting task, toileting completed mod I. Pt returned to supine to rest prior to next tx session, left with all needs in reach and bed alarm on.   Therapy Documentation Precautions:  Precautions Precautions: Fall Precaution Comments: left eye ptosis Restrictions Weight Bearing Restrictions: No Pain:   No/ denies pain  See Function Navigator for Current Functional Status.   Therapy/Group: Individual Therapy  Lewis, Daltyn Degroat C 11/26/2016, 7:15 AM

## 2016-11-26 NOTE — Progress Notes (Signed)
Subjective/Complaints: Had some CP last noc across enite upper chest no radiation, did have upset stomach as well (started after eating some greens).  Received analgesic cream to chest with improvement.  Has been constipated requiring dulcolax  ROS: Denies CP, SOB, N/V/D.  Objective: Vital Signs: Blood pressure (!) 148/92, pulse (!) 59, temperature 98.1 F (36.7 C), temperature source Oral, resp. rate (!) 63, height  (1.727 m), weight 79.3 kg (174 lb 12.8 oz), SpO2 100 %. No results found. No results found for this or any previous visit (from the past 72 hour(s)).   HEENT: Ptosis Left eye, unable to adduct pupil Cardio: RRR. No JVD. Resp: CTA B/L and Unlabored GI: BS positive. Soft Skin:   Bruise left eyelid Neuro: Alert/Oriented, Cranial Nerve Abnormalities Left CN3, Motor: RUE/RLE: 5/5 LUE/LLE: 4+/5 Musc/Skel:  No edema. No tenderness. Gen NAD. Vital signs reviewed.    Assessment/Plan: 1. Functional deficits secondary to Left MCA and PCA aneurysm which require 3+ hours per day of interdisciplinary therapy in a comprehensive inpatient rehab setting. Physiatrist is providing close team supervision and 24 hour management of active medical problems listed below. Physiatrist and rehab team continue to assess barriers to discharge/monitor patient progress toward functional and medical goals. FIM: Function - Bathing Position: Shower Body parts bathed by patient: Right arm, Right upper leg, Left arm, Left upper leg, Chest, Right lower leg, Abdomen, Front perineal area, Left lower leg, Buttocks Body parts bathed by helper: Back Assist Level: Supervision or verbal cues  Function- Upper Body Dressing/Undressing What is the patient wearing?: Pull over shirt/dress Pull over shirt/dress - Perfomed by patient: Pull shirt over trunk, Thread/unthread right sleeve, Thread/unthread left sleeve, Put head through opening Assist Level: Set up Set up : To obtain clothing/put away Function -  Lower Body Dressing/Undressing What is the patient wearing?: Non-skid slipper socks, Pants Position: Other (comment) (TTB) Pants- Performed by patient: Thread/unthread right pants leg, Thread/unthread left pants leg, Pull pants up/down Non-skid slipper socks- Performed by patient: Don/doff right sock, Don/doff left sock Assist for footwear: Supervision/touching assist Assist for lower body dressing: Supervision or verbal cues  Function - Toileting Toileting steps completed by patient: Adjust clothing prior to toileting, Performs perineal hygiene, Adjust clothing after toileting Toileting Assistive Devices: Grab bar or rail Assist level: Supervision or verbal cues  Function - Archivist transfer assistive device: Elevated toilet seat/BSC over toilet, Grab bar Assist level to toilet: Supervision or verbal cues Assist level from toilet: Supervision or verbal cues  Function - Chair/bed transfer Chair/bed transfer method: Stand pivot, Ambulatory Chair/bed transfer assist level: Supervision or verbal cues  Function - Locomotion: Wheelchair Will patient use wheelchair at discharge?: No Wheelchair activity did not occur: N/A Wheel 50 feet with 2 turns activity did not occur: N/A Wheel 150 feet activity did not occur: N/A Function - Locomotion: Ambulation Assistive device: Cane-straight Max distance: 75 Assist level: Supervision or verbal cues Assist level: Supervision or verbal cues Assist level: Supervision or verbal cues Assist level: Supervision or verbal cues Assist level: Touching or steadying assistance (Pt > 75%)  Function - Comprehension Comprehension: Auditory Comprehension assist level: Follows basic conversation/direction with extra time/assistive device  Function - Expression Expression: Verbal Expression assist level: Expresses basic 75 - 89% of the time/requires cueing 10 - 24% of the time. Needs helper to occlude trach/needs to repeat words.  Function -  Social Interaction Social Interaction assist level: Interacts appropriately 75 - 89% of the time - Needs redirection for appropriate language or  to initiate interaction.  Function - Problem Solving Problem solving assist level: Solves basic 75 - 89% of the time/requires cueing 10 - 24% of the time  Function - Memory Memory assist level: Recognizes or recalls 75 - 89% of the time/requires cueing 10 - 24% of the time Patient normally able to recall (first 3 days only): Current season, Staff names and faces, That he or she is in a hospital Medical Problem List and Plan: 1.  Gait disorder and cognitive deficits due to Left MCA and PCA aneurysm  Cont CIR PT, OT, SLP 2.  DVT Prophylaxis/Anticoagulation: Mechanical: Sequential compression devices, below knee Bilateral lower extremities 3. Pain Management: Tylenol for HA, controlled at present, CP appears MSK +/- GI related, improved 4. Mood: SW to monitor, support from team 5. Neuropsych: This patient is capable of making decisions on her own behalf. 6. Skin/Wound Care: Dry dressing to Left crani incision 7. Fluids/Electrolytes/Nutrition: monitor I/Os 8. Seizure prophylaxis: On Keppra bid 9. HTN: Monitor BP bid--continue HCTZ and metoprolol  Lisinopril increased to  on 4/7 for improved control, will cont to monitor   Vitals:   11/25/16 2302 11/26/16 0515  BP: (!) 150/86 (!) 148/92  Pulse: 61 (!) 59  Resp:  (!) 63  Temp:  98.1 F (36.7 C)   10. Chronic back pain: Has some relief with Neurontin. 11. H/o depression: Mood stable on Lexapro and trazodone 12.  Leukocytosis due to steroid , afebrile 13.  ABLA- monitor 14.  L CN3 palsy: ptosis and oculomotor weakness cont natural tears  LOS (Days) 4 A FACE TO FACE EVALUATION WAS PERFORMED  KIRSTEINS,ANDREW E 11/26/2016, 7:20 AM

## 2016-11-26 NOTE — H&P (Signed)
Pt c/o chest pain; area is tender to touch; pt described pain as aching and rated it at a 9 on a scale of 1-10; pain radiating from center/left side of chest to right side. Erlene Quan, MD notified; recommended muscle rub. Will continue to monitor.  Harlow Asa, RN 11/26/2016 12:13 AM

## 2016-11-26 NOTE — Progress Notes (Signed)
Physical Therapy Note  Patient Details  Name: Jennifer Jacobs MRN: [id number] Date of Birth: December 05, 1945 Today's Date: 11/26/2016  tx 1: 0800-0900, 60 min individual tx Pain: none reported  tx 1 focused on gait without AD.    Bed mobility from raised [facility] iwht supervision.  Pt sat EOB and donned gown and socks, supervision. Gait in room to toilet for continent voiding.  Gait transporting SPC in r hand, with mild staggering x 1, to R, recovered independently.  neuromuscular re-education via forced use for alternating reciprocal movement on NuStep at level 4 x 7 minutes.  Seated EOM, feet unsupported, reciprocal scooting for pelvic muscle activation, with greater difficulty with isolated pelvic retraction.  DGI = 15/24.   Gait without SPC to return to room, focusing on increased velocity.   Pt left resting in bed with all needs within reach, and alarm set.Marland Kitchen  tx 2 1500-1530, 30 min individual tx neuromuscular re-education via multimodal cues for supine: R/L straight leg raise, bil bridging, resisted R/L hip abduction, bil ankle circles; standing mini squats in R/L modified tandem positions, heel raises with extended R or L UE for trunk extension, R/L hip abduction. Advanced gait retro gait with light HHA x 5' x 3 without LOB.  Given external perturbations backwards, pt exhibited limited bil ankle strategy, absent hip and stepping strategies; these improved with practice.  Pt left resting in bed with alarm set and all needs within reach. See function navigator for current status.   Cheron Pasquarelli 11/26/2016, 7:42 AM

## 2016-11-27 ENCOUNTER — Inpatient Hospital Stay (HOSPITAL_COMMUNITY): Payer: Self-pay | Admitting: Occupational Therapy

## 2016-11-27 ENCOUNTER — Inpatient Hospital Stay (HOSPITAL_COMMUNITY): Payer: Medicare Other | Admitting: Physical Therapy

## 2016-11-27 MED ORDER — HYDROCODONE-ACETAMINOPHEN 5-325 MG PO TABS: 1.0000 | ORAL_TABLET | Freq: Three times a day (TID) | ORAL | 0 refills | Status: DC | PRN

## 2016-11-27 MED ORDER — HYDROCHLOROTHIAZIDE 25 MG PO TABS: 25.0000 mg | ORAL_TABLET | Freq: Every day | ORAL | 0 refills | Status: DC

## 2016-11-27 MED ORDER — DEXAMETHASONE 2 MG PO TABS
2.0000 mg | ORAL_TABLET | Freq: Three times a day (TID) | ORAL | Status: DC
  Administered 2016-11-27: 2 mg via ORAL
  Filled 2016-11-27: qty 1

## 2016-11-27 MED ORDER — ACETAMINOPHEN 325 MG PO TABS: 325.0000 mg | ORAL_TABLET | ORAL | Status: DC | PRN

## 2016-11-27 MED ORDER — GABAPENTIN 300 MG PO CAPS: 600.0000 mg | ORAL_CAPSULE | Freq: Three times a day (TID) | ORAL | 0 refills | Status: DC

## 2016-11-27 MED ORDER — LEVETIRACETAM 500 MG PO TABS: 500.0000 mg | ORAL_TABLET | Freq: Two times a day (BID) | ORAL | 0 refills | Status: DC

## 2016-11-27 MED ORDER — LISINOPRIL 30 MG PO TABS: 30.0000 mg | ORAL_TABLET | Freq: Every day | ORAL | 0 refills | Status: DC

## 2016-11-27 MED ORDER — PANTOPRAZOLE SODIUM 40 MG PO TBEC: 40.0000 mg | DELAYED_RELEASE_TABLET | Freq: Every day | ORAL | 0 refills | Status: DC

## 2016-11-27 MED ORDER — SENNOSIDES-DOCUSATE SODIUM 8.6-50 MG PO TABS: 2.0000 | ORAL_TABLET | Freq: Every day | ORAL | 0 refills | Status: DC

## 2016-11-27 MED ORDER — ESCITALOPRAM OXALATE 20 MG PO TABS: 20.0000 mg | ORAL_TABLET | Freq: Two times a day (BID) | ORAL | 0 refills | Status: DC

## 2016-11-27 MED ORDER — DEXAMETHASONE 2 MG PO TABS: ORAL_TABLET | ORAL | 0 refills | Status: DC

## 2016-11-27 MED ORDER — METOPROLOL TARTRATE 25 MG PO TABS: 25.0000 mg | ORAL_TABLET | Freq: Two times a day (BID) | ORAL | 0 refills | Status: DC

## 2016-11-27 MED ORDER — POTASSIUM CHLORIDE CRYS ER 20 MEQ PO TBCR: 20.0000 meq | EXTENDED_RELEASE_TABLET | Freq: Two times a day (BID) | ORAL | 0 refills | Status: DC

## 2016-11-27 MED ORDER — LAMOTRIGINE 100 MG PO TABS: 100.0000 mg | ORAL_TABLET | Freq: Two times a day (BID) | ORAL | 0 refills | Status: DC

## 2016-11-27 MED ORDER — MUSCLE RUB 10-15 % EX CREA: 1.0000 "application " | TOPICAL_CREAM | CUTANEOUS | 0 refills | Status: DC | PRN

## 2016-11-27 NOTE — Progress Notes (Signed)
Occupational Therapy Discharge Summary  Patient Details  Name: Jennifer Jacobs MRN: [id number] Date of Birth: [birth year]   Patient has met 27 of 10 long term goals due to improved activity tolerance, improved balance, postural control, improved attention, improved awareness and improved coordination.  Patient to discharge at overall Modified Independent level.  Patient's care partner is independent to provide the necessary physical assistance at discharge. Recommending supervision for tub/ shower transfer.     Recommendation:  Patient with no further OT needs  Equipment: No equipment provided; pt has shower chair.   Reasons for discharge: treatment goals met and discharge from hospital  Patient/family agrees with progress made and goals achieved: Yes  OT Discharge Precautions/Restrictions  Precautions Precautions: Fall Precaution Comments: left eye ptosis Restrictions Weight Bearing Restrictions: No Vision/Perception  Vision- History Baseline Vision/History: Wears glasses Wears Glasses: At all times Patient Visual Report: Other (comment) (L eye ptosis) Vision- Assessment Additional Comments: L eye unable to be opened without physical assist, pt able to compensate for decreased vision in safe manner  Cognition Overall Cognitive Status: Within Functional Limits for tasks assessed Arousal/Alertness: Awake/alert Orientation Level: Oriented X4 Memory: Impaired Memory Impairment: Decreased recall of new information Awareness: Appears intact Problem Solving: Appears intact Safety/Judgment: Appears intact Sensation Sensation Light Touch: Appears Intact Proprioception: Appears Intact Coordination Gross Motor Movements are Fluid and Coordinated: Yes Fine Motor Movements are Fluid and Coordinated: Yes Motor  Motor Motor: Within Functional Limits Trunk/Postural Assessment  Cervical Assessment Cervical Assessment: Within Functional Limits Thoracic Assessment Thoracic  Assessment: Within Functional Limits Lumbar Assessment Lumbar Assessment: Within Functional Limits Postural Control Postural Control: Within Functional Limits  Balance Balance Balance Assessed: Yes Static Sitting Balance Static Sitting - Balance Support: Feet supported Static Sitting - Level of Assistance: 7: Independent Dynamic Sitting Balance Dynamic Sitting - Level of Assistance: 7: Independent Sitting balance - Comments: Sitting to complete bathin/dressing tasks Static Standing Balance Static Standing - Balance Support: During functional activity Static Standing - Level of Assistance: 6: Modified independent (Device/Increase time) Dynamic Standing Balance Dynamic Standing - Balance Support: During functional activity;No upper extremity supported Dynamic Standing - Level of Assistance: 6: Modified independent (Device/Increase time) Extremity/Trunk Assessment RUE Assessment RUE Assessment: Within Functional Limits LUE Assessment LUE Assessment: Within Functional Limits   See Function Navigator for Current Functional Status.  Lewis, Lorel Lembo C 11/27/2016, 12:50 PM

## 2016-11-27 NOTE — Progress Notes (Signed)
Social Work  Discharge Note  The overall goal for the admission was met for:   Discharge location: [place] BE THERE IN THE EVENINGS.  Length of Stay: Yes-5 DAYS  Discharge activity level: Yes-INDEPENDENT LEVEL  Home/community participation: Yes  Services provided included: MD, RD, PT, OT, SLP, RN, CM, Pharmacy and SW  Financial Services: Medicare  Follow-up services arranged: Other: NO FOLLOW UP SERVICES RECOMMENDED OR DME  Comments (or additional information):PT EXCEEDED HER GOALS OF INDEPENDENT LEVEL. TO GO TO SON'S HOME DUE TO UNABLE TO DRIVE. PT PLANS TO RETURN TO WORK SOON, INFORMED TO CHECK WITH MD FIRST BEFORE DOING THIS  Patient/Family verbalized understanding of follow-up arrangements: Yes  Individual responsible for coordination of the follow-up plan: SELF & WALTER-SON  Confirmed correct DME delivered: Elease Hashimoto 11/27/2016    Elease Hashimoto

## 2016-11-27 NOTE — Progress Notes (Signed)
Physical Therapy Discharge Summary  Patient Details  Name: Jennifer Jacobs MRN: [id number] Date of Birth: 1946-07-26  Today's Date: 11/27/2016 PT Individual Time: 0803-0900 PT Individual Time Calculation (min): 57 min    Patient has met 10 of 11 long term goals due to improved activity tolerance, improved balance, improved postural control, improved attention, improved awareness and improved coordination.  Patient to discharge at an ambulatory level Supervision.   Patient's care partner is independent to provide the necessary physical assistance at discharge.  Reasons goals not met: Floor transfer not addressed.   Recommendation:  Patient will benefit from ongoing skilled PT services in home health setting to continue to advance safe functional mobility, address ongoing impairments in balance, safety, gait, strength, transfers, , and minimize fall risk.  Equipment: No equipment provided  Reasons for discharge: treatment goals met  Patient/family agrees with progress made and goals achieved: Yes  PT Discharge Motor  Motor Motor: Hemiplegia Motor - Skilled Clinical Observations: L sided Hemiplegia and weakness  Mobility Bed Mobility Bed Mobility: Rolling Right;Rolling Left;Supine to Sit;Sit to Supine Rolling Right: 6: Modified independent (Device/Increase time) Rolling Left: 6: Modified independent (Device/Increase time) Supine to Sit: 6: Modified independent (Device/Increase time) Sit to Supine: 6: Modified independent (Device/Increase time) Transfers Sit to Stand: 6: Modified independent (Device/Increase time) Stand Pivot Transfers: 6: Modified independent (Device/Increase time) Locomotion  Ambulation Ambulation: Yes Ambulation/Gait Assistance: 5: Supervision Ambulation Distance (Feet): 200 Feet Assistive device: None Ambulation/Gait Assistance Details: Verbal cues for precautions/safety Gait Gait: Yes Gait Pattern: Decreased trunk rotation;Left foot flat Stairs /  Additional Locomotion Stairs: Yes Stairs Assistance: 6: Modified independent (Device/Increase time) Stair Management Technique: One rail Right Number of Stairs: 12 Ramp: 6: Modified independent (Device) Curb: 6: Modified independent (Device/increase time)  Trunk/Postural Assessment  Cervical Assessment Cervical Assessment: Within Functional Limits Thoracic Assessment Thoracic Assessment: Within Functional Limits Lumbar Assessment Lumbar Assessment: Exceptions to [facility] Postural Control Postural Control: Deficits on evaluation  Balance Balance Balance Assessed: Yes Static Sitting Balance Static Sitting - Level of Assistance: 6: Modified independent (Device/Increase time) Dynamic Sitting Balance Dynamic Sitting - Level of Assistance: 6: Modified independent (Device/Increase time) Static Standing Balance Static Standing - Balance Support: During functional activity Static Standing - Level of Assistance: 6: Modified independent (Device/Increase time) Dynamic Standing Balance Dynamic Standing - Level of Assistance: 6: Modified independent (Device/Increase time) Extremity Assessment      RLE Assessment RLE Assessment: Within Functional Limits LLE Assessment LLE Assessment: Exceptions to [facility] LLE Strength LLE Overall Strength Comments: Grossly 4+/5 overall    See Function Navigator for Current Functional Status.  Rosita DeChalus  Rosita DeChalus, PTA 11/27/2016, 11:00 AM

## 2016-11-27 NOTE — Discharge Summary (Signed)
Physician Discharge Summary  Patient ID: Jennifer Jacobs MRN: [id number] DOB/AGE: [birth year] 69 y.o.  Admit date: 11/22/2016 Discharge date: 11/27/2016  Discharge Diagnoses:  Principal Problem:   Gait disturbance, post-stroke Active Problems:   Aneurysm of posterior communicating artery   Cognitive deficit due to recent stroke   Aneurysm of anterior communicating artery   Seizure prophylaxis   Weakness of left third cranial nerve   Vascular headache   Discharged Condition: stable  Significant Diagnostic Studies:   Labs:  Basic Metabolic Panel: BMP Latest Ref Rng & Units 11/23/2016 11/20/2016 11/18/2016  Glucose 65 - 99 mg/dL 409(W) 119(J) 478(G)  BUN 6 - 20 mg/dL Creatinine 0.44 - 1.00 mg/dL 9.56 2.13(Y) 8.65(H)  Sodium 135 - 145 mmol/L 138 143 140  Potassium 3.5 - 5.1 mmol/L 4.2 3.8 3.1(L)  Chloride 101 - 111 mmol/L 102 113(H) 104  CO2 22 - 32 mmol/L Calcium 8.9 - 10.3 mg/dL 9.4 8.4(O) 96.2     CBC: CBC Latest Ref Rng & Units 11/23/2016 11/20/2016 11/18/2016  WBC 4.0 - 10.5 K/uL 13.8(H) 21.8(H) 10.0  Hemoglobin 12.0 - 15.0 g/dL 9.5(M) 8.4(X) 32.4  Hematocrit 36.0 - 46.0 % 29.8(L) 29.5(L) 38.8  Platelets 150 - 400 K/uL 239 176 213    CBG: No results for input(s): GLUCAP in the last 168 hours.   Brief HPI:   Jennifer Jacobs a 71 y.o.femalewith history of HTN, fibromyalgia, seizure disorder who was admitted via [facility] 11/17/16 with acute onset of headache, photophobia and blurry vision progressing BLE weakness and left CN III palsy. She was found to have left MCA aneurysm and left posterior communicating artery aneurysm. She underwent left crani for aneurysm clipping by Dr. Bevely Palmer. Post op had agitation due to morphine. CT head 4/3 with expected surgical changes and  showing aneurysm clips.  Therapy ongoing and patient limited by decrease in balance with impulsivity, STM deficits and decreased awareness of deficits with poor safety.  CIR recommended for follow  up therapy   Hospital Course: Jennifer Jacobs was admitted to rehab 11/22/2016 for inpatient therapies to consist of PT and OT at least three hours five days a week. Past admission physiatrist, therapy team and rehab RN have worked together to provide customized collaborative inpatient rehab. Blood pressures were monitored on bid basis and were noted to be poorly controlled. Lisinopril was increased to 30 mg and decadron has been tapered to 2 mg tid. Anticipate blood pressures to improve as decadron is tapered off. Follow up BMET revealed hypokalemia has resolved and CBC shows that reactive leucocytosis is resolving. She has been afebrile and  crani incision is healing well without s/s of infection and is C/D/I.    Headaches have been managed with prn use of hydrocodone. This was decrased to every 8 hours and she was educated on further taper after discharge. She continues to have left ptosis with decrease in edema and ecchymosis. She has made great progress during her rehab stay and is independent at discharge. No follow up therapies recommended.    Rehab course: During patient's stay in rehab weekly team conferences were held to monitor patient's progress, set goals and discuss barriers to discharge. At admission, patient required min assist for basic self care tasks and mobility. She has had improvement in activity tolerance, balance, postural control, as well as ability to compensate for deficits. She is able to complete ADL tasks at modified independent level and is ambulating 200' without AD. She  has been educated on HEP and no family education needed at discharge.     Disposition: home  Diet: Regular  Special Instructions: 1. Needs BMET and CBC rechecked in 7-10 days.  2. No driving, smoking or strenuous activity.    Discharge Instructions    Ambulatory referral to Physical Medicine Rehab    Complete by:  As directed    1-2 week transitional care appt     Allergies as of 11/27/2016       Reactions   Prozac [fluoxetine Hcl] Other (See Comments)   Headaches      Medication List    STOP taking these medications   lisinopril-hydrochlorothiazide 20-25 MG tablet Commonly known as:  PRINZIDE,ZESTORETIC     TAKE these medications   acetaminophen 325 MG tablet Commonly known as:  TYLENOL Take 1-2 tablets (325-650 mg total) by mouth every 4 (four) hours as needed for mild pain.   CENTRUM SILVER ADULT 50+ PO Take 1 tablet by mouth daily.   dexamethasone 2 MG tablet Commonly known as:  DECADRON Take one pill every 8 hours for 3 days. Then decrease to one pill every 12 hours for 3 days. Then decrease to one daily till gone.   docusate sodium 100 MG capsule Commonly known as:  COLACE Take 100 mg by mouth daily as needed for mild constipation (constipation).   escitalopram 20 MG tablet Commonly known as:  LEXAPRO Take 1 tablet (20 mg total) by mouth 2 (two) times daily.   gabapentin 300 MG capsule Commonly known as:  NEURONTIN Take 2 capsules (600 mg total) by mouth 3 (three) times daily.   hydrochlorothiazide 25 MG tablet Commonly known as:  HYDRODIURIL Take 1 tablet (25 mg total) by mouth daily. Start taking on:  11/28/2016   HYDROcodone-acetaminophen 5-325 MG tablet--Rx # 30 pills Commonly known as:  NORCO/VICODIN Take 1 tablet by mouth every 8 (eight) hours as needed for moderate pain. What changed:  when to take this   lamoTRIgine 100 MG tablet Commonly known as:  LAMICTAL Take 1 tablet (100 mg total) by mouth 2 (two) times daily.   levETIRAcetam 500 MG tablet Commonly known as:  KEPPRA Take 1 tablet (500 mg total) by mouth 2 (two) times daily.   lisinopril 30 MG tablet Commonly known as:  PRINIVIL,ZESTRIL Take 1 tablet (30 mg total) by mouth daily. Start taking on:  11/28/2016   methocarbamol 500 MG tablet Commonly known as:  ROBAXIN Take 1 tablet (500 mg total) by mouth 3 (three) times daily.   metoprolol tartrate 25 MG tablet Commonly known as:   LOPRESSOR Take 1 tablet (25 mg total) by mouth 2 (two) times daily.   MUSCLE RUB 10-15 % Crea Apply 1 application topically as needed for muscle pain.   pantoprazole 40 MG tablet Commonly known as:  PROTONIX Take 1 tablet (40 mg total) by mouth daily. Start taking on:  11/28/2016   potassium chloride SA 20 MEQ tablet Commonly known as:  K-DUR,KLOR-CON Take 1 tablet (20 mEq total) by mouth 2 (two) times daily.   senna-docusate 8.6-50 MG tablet Commonly known as:  Senokot-S Take 2 tablets by mouth at bedtime.   traZODone 150 MG tablet Commonly known as:  DESYREL Take 1 tablet (150 mg total) by mouth at bedtime.      Follow-up Information    Erick Colace, MD Follow up.   Specialty:  Physical Medicine and Rehabilitation Why:  office will call you with follow up appointment Contact information: [address]  [address] [place] [address] [phone]        Loura Halt Ditty, MD. Call in 1 day(s).   Specialty:  Neurosurgery Why:  for follow up appointment Contact information: [address] [place] [address] [phone]        Eustace Moore, MD Follow up on 12/10/2016.   Specialty:  Family Medicine Why:  Appointment @ 1:40 PM Contact information: [address] [place] [address] [phone]           Signed: Jacquelynn Cree 11/27/2016, 5:52 PM

## 2016-11-27 NOTE — Progress Notes (Signed)
Occupational Therapy Session Note  Patient Details  Name: Jennifer Jacobs MRN: [id number] Date of Birth: 04-29-1946  Today's Date: 11/27/2016 OT Individual Time: 0945-1100 and 1130-1200 and 1330-1400 OT Individual Time Calculation (min): 75 min and 30 min and 30 min   Short Term Goals: Week 1:  OT Short Term Goal 1 (Week 1): STG=LTG due to LOS  Skilled Therapeutic Interventions/Progress Updates:    Session One: Pt seen for OT ADL bathing/dressing session. Pt in supine upon arrival, agreeable to tx session.  She ambulated throughout session at mod I level without AD. Gathered clothing items and towels in prep for showering task. She bathed seated on tub transfer bench mod I, standing with use of grab bars to complete pericare/ buttock hygiene. She dressed seated on tub bench mod I. She then completed tub/shower transfer without use of bench. Following demonstration she completed it with supervision. Recommend pt cont to use her shower chair that she had PTA and supervision for shower transfers. Pt ambulated to therapy day room, completed transfer sit <> stand low soft surface couch mod I. Completed card game activity from seated position focusing on cognition and sequencing, completed with supervision level assist. Pt returned to room at end of session, made mod I in room in prep for d/c home after therapies this afternoon.  Session Two: Pt seen for OT session focusing on funcitonal mobility and dual task. Pt in supine upon arrival, agreeable to tx session. She ambulated throughout room to pack up personal belongings in prep for d/c home this afternoon, completed at mod I level. Therapeutic activity focusing on ambulation and dual task. Ambulated throughout unit first balancing tennis ball on racket and then completed bouncing basketball throughout unit. Each completed with supervision, VCs required for safety awareness when bouncing ball as she would rush along to catch up to ball and result  in decreased balance. Pt returned to room at end of session, left in bed with all needs in reach.   Session Three: Pt seen for OT session focusing on ADL re-training, functional mobility, and activity tolerance. Pt supine in bed upon arrival, agreeable to tx session. She donned shoes/ socks seated EOB mod I. Ambulated into bathroom for continent BM and changed clothes while seated on the toilet mod I. She ambulated throughout unit to [employer]. Completed dynavision activity focusing on scanning for visual compensation and functional standing balance. Completed x4 trials with pt required to reach outside BOS and across midline to complete task, completed at supervision level. Last trial completed sit <> stand with pt required to sit and stand btwn each light for activity tolerance and strengthening. Following seated rest break, pt returned to room, left in supine with all needs in reach.   Therapy Documentation Precautions:  Precautions Precautions: Fall Precaution Comments: left eye ptosis Restrictions Weight Bearing Restrictions: No Pain:   No/ denies pain  See Function Navigator for Current Functional Status.   Therapy/Group: Individual Therapy  Lewis, Raed Schalk C 11/27/2016, 7:08 AM

## 2016-11-27 NOTE — Discharge Instructions (Signed)
Inpatient Rehab Discharge Instructions  Jennifer Jacobs Discharge date and time:  11/27/16  Activities/Precautions/ Functional Status: Activity: no lifting, driving, or strenuous exercise for till cleared by MD Diet: regular diet Wound Care: keep wound clean and dry   Functional status:  ___ No restrictions     ___ Walk up steps independently ___ 24/7 supervision/assistance   ___ Walk up steps with assistance _X_ Intermittent supervision/assistance  _X__ Bathe/dress independently ___ Walk with walker     ___ Bathe/dress with assistance ___ Walk Independently    ___ Shower independently ___ Walk with assistance    ___ Shower with assistance _X__ No alcohol     ___ Return to work/school ________  Special Instructions:    COMMUNITY REFERRALS UPON DISCHARGE:   None:  Recommended reached independent level   My questions have been answered and I understand these instructions. I will adhere to these goals and the provided educational materials after my discharge from the hospital.  Patient/Caregiver Signature _______________________________ Date __________  Clinician Signature _______________________________________ Date __________  Please bring this form and your medication list with you to all your follow-up doctor's appointments.

## 2016-11-27 NOTE — Progress Notes (Signed)
Physical Therapy Session Note  Patient Details  Name: Jennifer Jacobs MRN: [id number] Date of Birth: [birth year]  Today's Date: 11/27/2016 PT Individual Time: 0803-0900 PT Individual Time Calculation (min): 57 min   Short Term Goals: Week 1:  PT Short Term Goal 1 (Week 1): STG =LTg due to ELOS  Skilled Therapeutic Interventions/Progress Updates: Pt presented in bed agreeable to therapy. Performed bed mobility with mod I and use of features. Donned shoes independently sitting at EOB. Ambulated no AD to toilet with toilet transfers mod I. Performed FIM activities incl car transfer, 12 stairs, gait 210ft on level surface at primarily supervision level. Provided and taught [place]  A exercises as HEP. Pt demonstrated good understanding of exercises. Pt returned to room sitting at EOB with alarm on and NT in room.      Therapy Documentation Precautions:  Precautions Precautions: Fall Precaution Comments: left eye ptosis Restrictions Weight Bearing Restrictions: No   See Function Navigator for Current Functional Status.   Therapy/Group: Individual Therapy  Darwin Rothlisberger  Zaevion Parke, PTA  11/27/2016, 11:00 AM

## 2016-11-27 NOTE — Progress Notes (Signed)
Social Work Patient ID: Jennifer Jacobs, female   DOB: 08/12/1946, 71 y.o.   MRN: [id number]  Team feels pt has met her goals and is ready for discharge tomorrow or after therapies today. Pt is very pleased and  Son is aware and will be here at 4:00 pm. Pt is not using a assistive device and will check regarding follow up therapies. MD and PA aware and agreeable.

## 2016-11-27 NOTE — Progress Notes (Signed)
Subjective/Complaints: No issues overnite, staples removed yesterday, would like IV heplock removed ROS: Denies CP, SOB, N/V/D.  Objective: Vital Signs: Blood pressure 119/82, pulse 66, temperature 98.1 F (36.7 C), temperature source Oral, resp. rate 18, height  (1.727 m), weight 79.3 kg (174 lb 12.8 oz), SpO2 99 %. No results found. No results found for this or any previous visit (from the past 72 hour(s)).   HEENT: Ptosis Left eye, unable to adduct pupil Cardio: RRR. No JVD. Resp: CTA B/L and Unlabored GI: BS positive. Soft Skin:   Bruise left eyelid Neuro: Alert/Oriented, Cranial Nerve Abnormalities Left CN3, Motor: RUE/RLE: 5/5 LUE/LLE: 4+/5 Musc/Skel:  No edema. No tenderness. Gen NAD. Vital signs reviewed.    Assessment/Plan: 1. Functional deficits secondary to Left MCA and PCA aneurysm which require 3+ hours per day of interdisciplinary therapy in a comprehensive inpatient rehab setting. Physiatrist is providing close team supervision and 24 hour management of active medical problems listed below. Physiatrist and rehab team continue to assess barriers to discharge/monitor patient progress toward functional and medical goals. FIM: Function - Bathing Position: Shower Body parts bathed by patient: Right arm, Right upper leg, Left arm, Left upper leg, Chest, Right lower leg, Abdomen, Front perineal area, Left lower leg, Buttocks Body parts bathed by helper: Back Assist Level: Supervision or verbal cues  Function- Upper Body Dressing/Undressing What is the patient wearing?: Pull over shirt/dress Pull over shirt/dress - Perfomed by patient: Pull shirt over trunk, Thread/unthread right sleeve, Thread/unthread left sleeve, Put head through opening Assist Level: More than reasonable time Set up : To obtain clothing/put away Function - Lower Body Dressing/Undressing What is the patient wearing?: (P) Underwear Position: Other (comment) (Sitting on toilet) Pants- Performed  by patient: (P) Pull pants up/down Non-skid slipper socks- Performed by patient: Don/doff right sock, Don/doff left sock Assist for footwear: Setup Assist for lower body dressing: Supervision or verbal cues  Function - Toileting Toileting steps completed by patient: Adjust clothing prior to toileting, Performs perineal hygiene, Adjust clothing after toileting Toileting Assistive Devices: Grab bar or rail Assist level: Supervision or verbal cues  Function Programmer, multimedia transfer assistive device: Grab bar Assist level to toilet: Supervision or verbal cues Assist level from toilet: Supervision or verbal cues  Function - Chair/bed transfer Chair/bed transfer method: Stand pivot, Ambulatory Chair/bed transfer assist level: Supervision or verbal cues  Function - Locomotion: Wheelchair Will patient use wheelchair at discharge?: No Wheelchair activity did not occur: N/A Wheel 50 feet with 2 turns activity did not occur: N/A Wheel 150 feet activity did not occur: N/A Function - Locomotion: Ambulation Assistive device: Cane-straight Max distance: 150 Assist level: Supervision or verbal cues Assist level: Supervision or verbal cues Assist level: Supervision or verbal cues Assist level: Supervision or verbal cues Assist level: Touching or steadying assistance (Pt > 75%)  Function - Comprehension Comprehension: Auditory Comprehension assist level: Follows basic conversation/direction with extra time/assistive device  Function - Expression Expression: Verbal Expression assist level: Expresses basic 75 - 89% of the time/requires cueing 10 - 24% of the time. Needs helper to occlude trach/needs to repeat words.  Function - Social Interaction Social Interaction assist level: Interacts appropriately 75 - 89% of the time - Needs redirection for appropriate language or to initiate interaction.  Function - Problem Solving Problem solving assist level: Solves basic 75 - 89% of the  time/requires cueing 10 - 24% of the time  Function - Memory Memory assist level: Recognizes or recalls 75 - 89%  of the time/requires cueing 10 - 24% of the time Patient normally able to recall (first 3 days only): Current season, Staff names and faces, That he or she is in a hospital Medical Problem List and Plan: 1.  Gait disorder and cognitive deficits due to Left MCA and PCA aneurysm  Cont CIR PT, OT, SLP team conf in am 2.  DVT Prophylaxis/Anticoagulation: Mechanical: Sequential compression devices, below knee Bilateral lower extremities 3. Pain Management: Tylenol for HA, controlled at present, CP appears MSK +/- GI related, improved 4. Mood: SW to monitor, support from team 5. Neuropsych: This patient is capable of making decisions on her own behalf. 6. Skin/Wound Care: staples removed from crani incision 7. Fluids/Electrolytes/Nutrition: monitor I/Os 8. Seizure prophylaxis: On Keppra bid 9. HTN: Monitor BP bid--continue HCTZ and metoprolol  Lisinopril increased to  on 4/7 for improved control, will monitor for hypotension   Vitals:   11/26/16 1542 11/27/16 0529  BP: 120/63 119/82  Pulse: 62 66  Resp: (!) 63 18  Temp: 98.4 F (36.9 C) 98.1 F (36.7 C)   10. Chronic back pain: Has some relief with Neurontin. 11. H/o depression: Mood stable on Lexapro and trazodone 12.  Leukocytosis due to steroid , afebrile 13.  ABLA- monitor 14.  L CN3 palsy: ptosis and oculomotor weakness cont natural tears  LOS (Days) 5 A FACE TO FACE EVALUATION WAS PERFORMED  Jennifer Jacobs E 11/27/2016, 7:18 AM

## 2016-11-27 NOTE — Patient Care Conference (Signed)
Inpatient RehabilitationTeam Conference and Plan of Care Update Date: 11/27/2016   Time: 2:00 PM    Patient Name: Jennifer Jacobs      Medical Record Number: [id number]  Date of Birth: Dec 05, 1945 Sex: Female         Room/Bed: 4M02C/4M02C-01 Payor Info: Payor: MEDICARE / Plan: MEDICARE PART A AND B / Product Type: *No Product type* /    Admitting Diagnosis: L MCA Aneurysm Clipping  Admit Date/Time:  11/22/2016  3:35 PM Admission Comments: No comment available   Primary Diagnosis:  <principal problem not specified> Principal Problem: <principal problem not specified>  Patient Active Problem List   Diagnosis Date Noted  . Vascular headache   . Seizure prophylaxis   . Weakness of left third cranial nerve   . Gait disturbance, post-stroke 11/22/2016  . Cognitive deficit due to recent stroke 11/22/2016  . Aneurysm of anterior communicating artery 11/22/2016  . Intracranial aneurysm   . Posterior communicating artery aneurysm   . Benign essential HTN   . Seizure disorder (HCC)   . Acute blood loss anemia   . Leukocytosis   . Aneurysm (HCC) 11/18/2016  . 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  . Bilateral leg pain 06/26/2016  . AAA (abdominal aortic aneurysm) without rupture (HCC) 06/26/2016  . Insomnia 06/07/2015  . Urinary incontinence 06/07/2015  . Chronic pain syndrome 03/04/2015  . Fibromyalgia 03/04/2015  . Neck pain 03/04/2015  . Cervical radiculitis 03/04/2015  . Lower back pain 03/04/2015  . Lumbar radicular pain 03/04/2015  . Major depression 07/05/2014  . Alcohol abuse 07/05/2014  . Liver cyst 03/18/2014  . Claudication, class I (HCC) 03/18/2013  . Hypertension   . Smoker unmotivated to quit     Expected Discharge Date: Expected Discharge Date: 11/27/16  Team Members Present: Physician leading conference: Dr. Claudette Laws Social Worker Present: Dossie Der, LCSW Nurse Present: Carmie End, RN PT Present: Harless Litten, PTA OT Present:  Johnsie Cancel, OT SLP Present: Feliberto Gottron, SLP PPS Coordinator present : Tora Duck, RN, CRRN     Current Status/Progress Goal Weekly Team Focus  Medical     On home meds-staples removed yesterday   medical stability     Bowel/Bladder        Cont B & B     Swallow/Nutrition/ Hydration        na     ADL's   Mod I overall; supervision tub/shower transfer  Pt at goal level  d/c planning; pt to d/c home tomorrow   Mobility   Supervision gait 2104ft with no AD, mod I bed mobilty, supervision stairs 1 rail, occasional decreased ankle strategy with gait  Mod I   higher level balance, gait with LRAD    Communication        na     Safety/Cognition/ Behavioral Observations            Pain     less than 3-headaches better        Skin        staples removed yesterday, no other skin issues        *See Care Plan and progress notes for long and short-term goals.  Barriers to Discharge:   medical stability   Possible Resolutions to Barriers:    none   Discharge Planning/Teaching Needs:    Home with son and his fiance who can provide evening assist. Pt is aware she will not be driving, but wants to return to work ASAP-aware to check with  MD first.     Team Discussion:  Pt reached her goals of independent level, SP discharged no follow needed. PT & OT do not recommend follow up. Medically stable for DC today after therapies.   Revisions to Treatment Plan:  DC 4/10      Lucy Chris 11/27/2016, 3:47 PM

## 2016-11-27 NOTE — Progress Notes (Signed)
Social Work Lucy Chris, LCSW Social Worker Signed   Patient Care Conference Date of Service: 11/27/2016 10:48 AM      Hide copied text Hover for attribution information Inpatient RehabilitationTeam Conference and Plan of Care Update Date: 11/27/2016   Time: 2:00 PM      Patient Name: Jennifer Jacobs      Medical Record Number: [id number]  Date of Birth: 1945/11/09 Sex: Female         Room/Bed: 4M02C/4M02C-01 Payor Info: Payor: MEDICARE / Plan: MEDICARE PART A AND B / Product Type: *No Product type* /     Admitting Diagnosis: L MCA Aneurysm Clipping  Admit Date/Time:  11/22/2016  3:35 PM Admission Comments: No comment available    Primary Diagnosis:  <principal problem not specified> Principal Problem: <principal problem not specified>       Patient Active Problem List    Diagnosis Date Noted  . Vascular headache    . Seizure prophylaxis    . Weakness of left third cranial nerve    . Gait disturbance, post-stroke 11/22/2016  . Cognitive deficit due to recent stroke 11/22/2016  . Aneurysm of anterior communicating artery 11/22/2016  . Intracranial aneurysm    . Posterior communicating artery aneurysm    . Benign essential HTN    . Seizure disorder (HCC)    . Acute blood loss anemia    . Leukocytosis    . Aneurysm (HCC) 11/18/2016  . 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  . Bilateral leg pain 06/26/2016  . AAA (abdominal aortic aneurysm) without rupture (HCC) 06/26/2016  . Insomnia 06/07/2015  . Urinary incontinence 06/07/2015  . Chronic pain syndrome 03/04/2015  . Fibromyalgia 03/04/2015  . Neck pain 03/04/2015  . Cervical radiculitis 03/04/2015  . Lower back pain 03/04/2015  . Lumbar radicular pain 03/04/2015  . Major depression 07/05/2014  . Alcohol abuse 07/05/2014  . Liver cyst 03/18/2014  . Claudication, class I (HCC) 03/18/2013  . Hypertension    . Smoker unmotivated to quit        Expected Discharge Date: Expected Discharge Date:  11/27/16   Team Members Present: Physician leading conference: Dr. Claudette Laws Social Worker Present: Dossie Der, LCSW Nurse Present: Carmie End, RN PT Present: Harless Litten, PTA OT Present: Johnsie Cancel, OT SLP Present: Feliberto Gottron, SLP PPS Coordinator present : Tora Duck, RN, CRRN       Current Status/Progress Goal Weekly Team Focus  Medical       On home meds-staples removed yesterday   medical stability     Bowel/Bladder          Cont B & B     Swallow/Nutrition/ Hydration          na     ADL's     Mod I overall; supervision tub/shower transfer  Pt at goal level  d/c planning; pt to d/c home tomorrow   Mobility     Supervision gait 248ft with no AD, mod I bed mobilty, supervision stairs 1 rail, occasional decreased ankle strategy with gait  Mod I   higher level balance, gait with LRAD    Communication          na     Safety/Cognition/ Behavioral Observations             Pain       less than 3-headaches better        Skin          staples removed yesterday, no other  skin issues       *See Care Plan and progress notes for long and short-term goals.   Barriers to Discharge:   medical stability    Possible Resolutions to Barriers:    none    Discharge Planning/Teaching Needs:    Home with son and his fiance who can provide evening assist. Pt is aware she will not be driving, but wants to return to work ASAP-aware to check with MD first.     Team Discussion:  Pt reached her goals of independent level, SP discharged no follow needed. PT & OT do not recommend follow up. Medically stable for DC today after therapies.   Revisions to Treatment Plan:  DC 4/10      Lucy Chris 11/27/2016, 3:47 PM       Patient ID: [name], female   DOB: July 08, 1946, 71 y.o.   MRN: [id number]

## 2016-11-28 ENCOUNTER — Telehealth: Payer: Self-pay

## 2016-11-28 NOTE — Telephone Encounter (Signed)
Transitional Care call  Patient name: Arbor, Leer   DOB: 1946/01/08 1. Are you/is patient experiencing any problems since coming home? no a. Are there any questions regarding any aspect of care? no 2. Are there any questions regarding medications administration/dosing? No  a. Are meds being taken as prescribed? yes b. "Patient should review meds with caller to confirm" 3. Have there been any falls? no 4. Has Home Health been to the house and/or have they contacted you? no a. If not, have you tried to contact them? no b. Can we help you contact them? no 5. Are bowels and bladder emptying properly? yes a. Are there any unexpected incontinence issues? no b. If applicable, is patient following bowel/bladder programs? na 6. Any fevers, problems with breathing, unexpected pain? no 7. Are there any skin problems or new areas of breakdown? no 8. Has the patient/family member arranged specialty MD follow up (ie cardiology/neurology/renal/surgical/etc.)?  yes a. Can we help arrange? no 9. Does the patient need any other services or support that we can help arrange? no 10. Are caregivers following through as expected in assisting the patient? no 11. Has the patient quit smoking, drinking alcohol, or using drugs as recommended? na  Appointment date/time:12-06-2016 / 1130am, arrive time:1100am and who it is with here Dr. Wynn Banker [address]

## 2016-11-28 NOTE — Telephone Encounter (Signed)
Called multiple contacts with no answer, left message at home number of mrs cobb to return call  Patient name: Jennifer Jacobs, Jennifer Jacobs   DOB: 11-11-1945  Appointment date/time:12-06-2016 / 1130am, arrive time:1100am and who it is with here Dr. Wynn Banker [address] [phone]

## 2016-12-06 ENCOUNTER — Encounter: Payer: Self-pay | Admitting: Physical Medicine & Rehabilitation

## 2016-12-07 ENCOUNTER — Other Ambulatory Visit: Payer: Self-pay | Admitting: Physical Medicine and Rehabilitation

## 2016-12-08 ENCOUNTER — Emergency Department (HOSPITAL_COMMUNITY)
Admission: EM | Admit: 2016-12-08 | Discharge: 2016-12-08 | Disposition: A | Discharge status: Home / Self Care | Payer: Medicare Other | Attending: Emergency Medicine | Admitting: Emergency Medicine

## 2016-12-08 ENCOUNTER — Encounter (HOSPITAL_COMMUNITY): Payer: Self-pay | Admitting: *Deleted

## 2016-12-08 ENCOUNTER — Emergency Department (HOSPITAL_COMMUNITY): Payer: Medicare Other

## 2016-12-08 DIAGNOSIS — F17210 Nicotine dependence, cigarettes, uncomplicated: Secondary | ICD-10-CM | POA: Diagnosis not present

## 2016-12-08 DIAGNOSIS — I10 Essential (primary) hypertension: Secondary | ICD-10-CM | POA: Diagnosis not present

## 2016-12-08 DIAGNOSIS — R51 Headache: Secondary | ICD-10-CM | POA: Insufficient documentation

## 2016-12-08 DIAGNOSIS — Z79899 Other long term (current) drug therapy: Secondary | ICD-10-CM | POA: Insufficient documentation

## 2016-12-08 DIAGNOSIS — I252 Old myocardial infarction: Secondary | ICD-10-CM | POA: Diagnosis not present

## 2016-12-08 DIAGNOSIS — R519 Headache, unspecified: Secondary | ICD-10-CM

## 2016-12-08 LAB — COMPREHENSIVE METABOLIC PANEL
ALBUMIN: 3.3 g/dL — AB (ref 3.5–5.0)
ALT: 18 U/L (ref 14–54)
AST: 19 U/L (ref 15–41)
Alkaline Phosphatase: 50 U/L (ref 38–126)
Anion gap: 11 (ref 5–15)
BILIRUBIN TOTAL: 0.6 mg/dL (ref 0.3–1.2)
BUN: 19 mg/dL (ref 6–20)
CALCIUM: 9.7 mg/dL (ref 8.9–10.3)
CO2: 27 mmol/L (ref 22–32)
CREATININE: 0.98 mg/dL (ref 0.44–1.00)
Chloride: 100 mmol/L — ABNORMAL LOW (ref 101–111)
GFR calc Af Amer: >60 mL/min (ref >60)
GFR calc non Af Amer: 58 mL/min — ABNORMAL LOW (ref >60)
GLUCOSE: 90 mg/dL (ref 65–99)
POTASSIUM: 4.3 mmol/L (ref 3.5–5.1)
SODIUM: 138 mmol/L (ref 135–145)
TOTAL PROTEIN: 6.3 g/dL — AB (ref 6.5–8.1)

## 2016-12-08 LAB — URINALYSIS, ROUTINE W REFLEX MICROSCOPIC
Bilirubin Urine: NEGATIVE
GLUCOSE, UA: NEGATIVE mg/dL
HGB URINE DIPSTICK: NEGATIVE
Ketones, ur: NEGATIVE mg/dL
LEUKOCYTES UA: NEGATIVE
Nitrite: NEGATIVE
PROTEIN: NEGATIVE mg/dL
Specific Gravity, Urine: 1.014 (ref 1.005–1.030)
pH: 8 (ref 5.0–8.0)

## 2016-12-08 LAB — CBC WITH DIFFERENTIAL/PLATELET
BASOS ABS: 0 10*3/uL (ref 0.0–0.1)
Basophils Relative: 0 %
EOS PCT: 2 %
Eosinophils Absolute: 0.2 10*3/uL (ref 0.0–0.7)
HCT: 34.7 % — ABNORMAL LOW (ref 36.0–46.0)
Hemoglobin: 11.1 g/dL — ABNORMAL LOW (ref 12.0–15.0)
LYMPHS PCT: 29 %
Lymphs Abs: 2.5 10*3/uL (ref 0.7–4.0)
MCH: 30.9 pg (ref 26.0–34.0)
MCHC: 32 g/dL (ref 30.0–36.0)
MCV: 96.7 fL (ref 78.0–100.0)
MONO ABS: 0.5 10*3/uL (ref 0.1–1.0)
Monocytes Relative: 6 %
Neutro Abs: 5.4 10*3/uL (ref 1.7–7.7)
Neutrophils Relative %: 63 %
PLATELETS: 209 10*3/uL (ref 150–400)
RBC: 3.59 MIL/uL — ABNORMAL LOW (ref 3.87–5.11)
RDW: 15.8 % — AB (ref 11.5–15.5)
WBC: 8.5 10*3/uL (ref 4.0–10.5)

## 2016-12-08 LAB — I-STAT CG4 LACTIC ACID, ED
Lactic Acid, Venous: 2.26 mmol/L (ref 0.5–1.9)
Lactic Acid, Venous: 1.05 mmol/L (ref 0.5–1.9)

## 2016-12-08 MED ORDER — DIPHENHYDRAMINE HCL 50 MG/ML IJ SOLN
25.0000 mg | Freq: Once | INTRAMUSCULAR | Status: AC
  Administered 2016-12-08: 25 mg via INTRAVENOUS
  Filled 2016-12-08: qty 1

## 2016-12-08 MED ORDER — METOCLOPRAMIDE HCL 5 MG/ML IJ SOLN
5.0000 mg | Freq: Once | INTRAMUSCULAR | Status: AC
  Administered 2016-12-08: 5 mg via INTRAVENOUS
  Filled 2016-12-08: qty 2

## 2016-12-08 MED ORDER — SODIUM CHLORIDE 0.9 % IV BOLUS (SEPSIS)
1000.0000 mL | Freq: Once | INTRAVENOUS | Status: AC
  Administered 2016-12-08: 1000 mL via INTRAVENOUS

## 2016-12-08 MED ORDER — HYDROCODONE-ACETAMINOPHEN 5-325 MG PO TABS: 1.0000 | ORAL_TABLET | ORAL | 0 refills | Status: DC | PRN

## 2016-12-08 MED ORDER — DIPHENHYDRAMINE HCL 25 MG PO CAPS: 25.0000 mg | ORAL_CAPSULE | Freq: Once | ORAL | Status: DC

## 2016-12-08 MED ORDER — KETOROLAC TROMETHAMINE 15 MG/ML IJ SOLN
15.0000 mg | Freq: Once | INTRAMUSCULAR | Status: AC
  Administered 2016-12-08: 15 mg via INTRAVENOUS
  Filled 2016-12-08: qty 1

## 2016-12-08 MED ORDER — METOCLOPRAMIDE HCL 10 MG PO TABS: 5.0000 mg | ORAL_TABLET | Freq: Once | ORAL | Status: DC

## 2016-12-08 NOTE — ED Provider Notes (Signed)
[facility] DEPT Provider Note   CSN: [id number] Arrival date & time: 12/08/16  1717     History   Chief Complaint Chief Complaint  Patient presents with  . Headache  . Post-op Problem    HPI Jennifer Jacobs is a 71 y.o. female.  HPI   71 year old female presents today with complaints of headache.  Patient has a significant past medical history of hypertension, fibromyalgia, seizure disorder who was recently admitted on 11/17/2016 with acute onset of headache photophobia and blurred vision with progressing bilateral lower extremity weakness and left cranial nerve III palsy.  She was noted to have a left MCA aneurysm and left posterior communicating artery aneurysm.  She underwent surgery with Dr. Maryan Puls for clipping of the aneurysm.  Patient notes that she had a slight headache after the surgery but no severe symptoms.  She notes over the last 1-2 days she has had worsening left-sided headache from the back of her skull radiating forward.  Patient notes she does have difficulty with vision of the left eye secondary to nerve palsy, but has no changes in her baseline neurological function.  Patient notes she has been using hydrocodone at home as needed for pain, notes she has 1 pill left and did not take it today she does not want to run out.    Past Medical History:  Diagnosis Date  . Acute blood loss anemia   . Allergy   . Anxiety   . Anxiety and depression   . Arthritis   . Cognitive deficit due to recent stroke 11/22/2016  . Depression   . Dyslipidemia   . Fibromyalgia   . Hepatic cyst   . Hyperlipidemia   . Hypertension   . MI (myocardial infarction) ([place]) 2007   By patient report, not substantiated by recent nuclear stress test.  . Neuromuscular disorder ([place])   . Seizure disorder ([place])    none since 2002  . Seizures ([place])   . Smoker unmotivated to quit    Quit for 3 years and then restarted  . Substance abuse    ETOH  . Vision abnormalities     Patient  Active Problem List   Diagnosis Date Noted  . Vascular headache   . Seizure prophylaxis   . Weakness of left third cranial nerve   . Gait disturbance, post-stroke 11/22/2016  . Cognitive deficit due to recent stroke 11/22/2016  . Aneurysm of anterior communicating artery 11/22/2016  . Intracranial aneurysm   . Posterior communicating artery aneurysm   . Benign essential HTN   . Seizure disorder ([place])   . Acute blood loss anemia   . Leukocytosis   . Aneurysm ([place]) 11/18/2016  . 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  . Bilateral leg pain 06/26/2016  . AAA (abdominal aortic aneurysm) without rupture ([place]) 06/26/2016  . Insomnia 06/07/2015  . Urinary incontinence 06/07/2015  . Chronic pain syndrome 03/04/2015  . Fibromyalgia 03/04/2015  . Neck pain 03/04/2015  . Cervical radiculitis 03/04/2015  . Lower back pain 03/04/2015  . Lumbar radicular pain 03/04/2015  . Major depression 07/05/2014  . Alcohol abuse 07/05/2014  . Liver cyst 03/18/2014  . Claudication, class I ([place]) 03/18/2013  . Hypertension   . Smoker unmotivated to quit     Past Surgical History:  Procedure Laterality Date  . CRANIOTOMY Left 11/19/2016   Procedure: CRANIOTOMY INTRACRANIAL FOR  ANEURYSM CLIPPING;  Surgeon: Kevan Ny Ditty, MD;  Location: [facility];  Service: Neurosurgery;  Laterality: Left;  . ELBOW  ARTHROPLASTY Left    rods  . Exercise tolerance test  04/27/2013   CPET-MET: Submaximal effort (0.96, with goal of greater than 1.09) -- patient states that her effort was limited due to knee pain.  This makes the rest of the interpretation difficult: Peak VO2 - 10.5 (59% moderate), peak 02-pulse -  7..11 (low); heart rate 114 (73% -chronic incompetence considered);; PFTs normal   . MOUTH SURGERY    . NM MYOVIEW LTD  05/19/2013   LexiScan: EF 80%, no evidence of ischemia or infarction  . TRANSTHORACIC ECHOCARDIOGRAM  03/25/2013   EF 55-60%, no regional wall motion abnormalities; normal  diastolic parameters, normal pulmonary pressures.  No valvular lesions noted.  Essentially normal echo     OB History    No data available      Home Medications    Prior to Admission medications   Medication Sig Start Date End Date Taking? Authorizing Provider  acetaminophen (TYLENOL) 325 MG tablet Take 1-2 tablets (325-650 mg total) by mouth every 4 (four) hours as needed for mild pain. 11/27/16  Yes Ivan Anchors Love, PA-C  docusate sodium (COLACE) 100 MG capsule Take 100 mg by mouth daily as needed for mild constipation (constipation).    Yes Historical Provider, MD  escitalopram (LEXAPRO) 20 MG tablet Take 1 tablet (20 mg total) by mouth 2 (two) times daily. 11/27/16  Yes Ivan Anchors Love, PA-C  gabapentin (NEURONTIN) 300 MG capsule Take 2 capsules (600 mg total) by mouth 3 (three) times daily. 11/27/16  Yes Ivan Anchors Love, PA-C  hydrochlorothiazide (HYDRODIURIL) 25 MG tablet Take 1 tablet (25 mg total) by mouth daily. 11/28/16  Yes Ivan Anchors Love, PA-C  lamoTRIgine (LAMICTAL) 100 MG tablet Take 1 tablet (100 mg total) by mouth 2 (two) times daily. 11/27/16 11/27/17 Yes Ivan Anchors Love, PA-C  levETIRAcetam (KEPPRA) 500 MG tablet Take 1 tablet (500 mg total) by mouth 2 (two) times daily. 11/27/16  Yes Ivan Anchors Love, PA-C  lisinopril (PRINIVIL,ZESTRIL) 30 MG tablet Take 1 tablet (30 mg total) by mouth daily. 11/28/16  Yes Ivan Anchors Love, PA-C  Menthol-Methyl Salicylate (MUSCLE RUB) 10-15 % CREA Apply 1 application topically as needed for muscle pain. 11/27/16  Yes Ivan Anchors Love, PA-C  methocarbamol (ROBAXIN) 500 MG tablet Take 1 tablet (500 mg total) by mouth 3 (three) times daily. 11/22/16  Yes Vincent J Costella, PA-C  metoprolol tartrate (LOPRESSOR) 25 MG tablet Take 1 tablet (25 mg total) by mouth 2 (two) times daily. 11/27/16  Yes Ivan Anchors Love, PA-C  pantoprazole (PROTONIX) 40 MG tablet Take 1 tablet (40 mg total) by mouth daily. 11/28/16  Yes Ivan Anchors Love, PA-C  potassium chloride SA (K-DUR,KLOR-CON) 20 MEQ  tablet Take 1 tablet (20 mEq total) by mouth 2 (two) times daily. 11/27/16  Yes Ivan Anchors Love, PA-C  senna-docusate (SENOKOT-S) 8.6-50 MG tablet Take 2 tablets by mouth at bedtime. 11/27/16  Yes Ivan Anchors Love, PA-C  traZODone (DESYREL) 150 MG tablet Take 1 tablet (150 mg total) by mouth at bedtime. 10/22/16  Yes Cloria Spring, MD  dexamethasone (DECADRON) 2 MG tablet Take one pill every 8 hours for 3 days. Then decrease to one pill every 12 hours for 3 days. Then decrease to one daily till gone. Patient not taking: Reported on 12/08/2016 11/27/16   Ivan Anchors Love, PA-C  HYDROcodone-acetaminophen (NORCO/VICODIN) 5-325 MG tablet Take 1 tablet by mouth every 4 (four) hours as needed. 12/08/16   Okey Regal, PA-C  Family History Family History  Problem Relation Age of Onset  . Heart attack Father 61  . Hypertension Father   . Alcohol abuse Father   . Heart attack Maternal Grandmother   . Cancer Maternal Grandfather     type unknown  . Cancer Paternal Grandmother     type unknown  . Depression Mother   . Alcohol abuse Mother   . Ovarian cancer Paternal Aunt   . Cancer Paternal Aunt     ovarian  . Prostate cancer Paternal Uncle   . Stomach cancer Paternal Aunt   . Diabetes Daughter   . Heart disease Daughter 51    heart attack  . Depression Sister   . Alcohol abuse Brother   . Depression Sister   . Alcohol abuse Sister   . Depression Sister   . Alcohol abuse Sister   . Alcohol abuse Brother   . Alcohol abuse Son     Social History Social History  Substance Use Topics  . Smoking status: Current Every Day Smoker    Packs/day: 0.10    Types: Cigarettes  . Smokeless tobacco: Never Used     Comment: one pack every 3 months  . Alcohol use No     Comment: occasionally     Allergies   Prozac [fluoxetine hcl]   Review of Systems Review of Systems  All other systems reviewed and are negative.    Physical Exam Updated Vital Signs BP 118/73   Pulse 86   Temp 99.9 F  (37.7 C) (Oral)   Resp 16   SpO2 96%   Physical Exam  Constitutional: She is oriented to person, place, and time.  HENT:  Ptosis left eye-fixed pupil unable to track with left eye; right eye equal round reactive to light; diplopia  Neurological: She is oriented to person, place, and time. She has normal strength. A cranial nerve deficit is present. No sensory deficit. Coordination abnormal. GCS eye subscore is 4. GCS verbal subscore is 5. GCS motor subscore is 6.  Nursing note reviewed.    ED Treatments / Results  Labs (all labs ordered are listed, but only abnormal results are displayed) Labs Reviewed  COMPREHENSIVE METABOLIC PANEL - Abnormal; Notable for the following:       Result Value   Chloride 100 (*)    Total Protein 6.3 (*)    Albumin 3.3 (*)    GFR calc non Af Amer 58 (*)    All other components within normal limits  CBC WITH DIFFERENTIAL/PLATELET - Abnormal; Notable for the following:    RBC 3.59 (*)    Hemoglobin 11.1 (*)    HCT 34.7 (*)    RDW 15.8 (*)    All other components within normal limits  I-STAT CG4 LACTIC ACID, ED - Abnormal; Notable for the following:    Lactic Acid, Venous 2.26 (*)    All other components within normal limits  URINALYSIS, ROUTINE W REFLEX MICROSCOPIC  I-STAT CG4 LACTIC ACID, ED    EKG  EKG Interpretation None       Radiology Ct Head Wo Contrast  Result Date: 12/08/2016 CLINICAL DATA:  Headache for 3 days EXAM: CT HEAD WITHOUT CONTRAST TECHNIQUE: Contiguous axial images were obtained from the base of the skull through the vertex without intravenous contrast. COMPARISON:  Head CT 11/20/2016 FINDINGS: Brain: No mass lesion, intraparenchymal hemorrhage or extra-axial collection. No evidence of acute cortical infarct. Brain parenchyma and CSF-containing spaces are normal for age. Vascular: There are aneurysm clips  at the sites of treated left MCA bifurcation and left posterior communicating artery aneurysms Skull: Remote left  pterional craniotomy. Sinuses/Orbits: No sinus fluid levels or advanced mucosal thickening. No mastoid effusion. Normal orbits. IMPRESSION: 1. No acute intracranial abnormality. 2. Status post clipping of left posterior communicating artery and left MCA bifurcation aneurysms. Electronically Signed   By: Ulyses Jarred M.D.   On: 12/08/2016 19:48    Procedures Procedures (including critical care time)  Medications Ordered in ED Medications  sodium chloride 0.9 % bolus 1,000 mL (0 mLs Intravenous Stopped 12/08/16 2140)  ketorolac (TORADOL) 15 MG/ML injection 15 mg (15 mg Intravenous Given 12/08/16 2051)  metoCLOPramide (REGLAN) injection 5 mg (5 mg Intravenous Given 12/08/16 2050)  diphenhydrAMINE (BENADRYL) injection 25 mg (25 mg Intravenous Given 12/08/16 2050)     Initial Impression / Assessment and Plan / ED Course  I have reviewed the triage vital signs and the nursing notes.  Pertinent labs & imaging results that were available during my care of the patient were reviewed by me and considered in my medical decision making (see chart for details).      Final Clinical Impressions(s) / ED Diagnoses   Final diagnoses:  Nonintractable episodic headache, unspecified headache type    Labs: I-STAT lactic acid, urinalysis, CMP, CBC  Imaging: CT head without contrast  Consults:  Therapeutics: Toradol, Reglan, Benadryl, normal saline  Discharge Meds:   Assessment/Plan: 71 year old female presents today with left-sided headache.  Patient reports that she does not want to take her last narcotic pain medication and presents for evaluation of this headache.  This was slow onset.  Patient does not have any new neurological deficits.  CT head obtained here with no significant findings.  I consulted neurosurgery and spoke with Dr. Cyndy Freeze.  No need for further evaluation or management here in the ED setting, patient stable for discharge home with outpatient follow-up.  Patient has significant  improvement in her symptoms with ED administered medication, she is given strict return precaution, follow-up information.  She verbalized understanding and agreement to today's plan had no further questions or concerns the time discharge  Lactic acid unlikely to be infectious in nature.  Patient denies any infectious etiology.  Improved with 1 L fluid.    New Prescriptions Discharge Medication List as of 12/08/2016  8:59 PM       Okey Regal, PA-C 12/09/16 1731    Gareth Morgan, MD 12/10/16 1212

## 2016-12-08 NOTE — Discharge Instructions (Signed)
Please read attached information. If you experience any new or worsening signs or symptoms please return to the emergency room for evaluation. Please follow-up with your primary care provider or specialist as discussed. Please use medication prescribed only as directed and discontinue taking if you have any concerning signs or symptoms.   °

## 2016-12-08 NOTE — ED Notes (Signed)
Patient transported to CT 

## 2016-12-08 NOTE — ED Triage Notes (Signed)
Pt reports having recent headache x 3 days, minimal relief with pain meds at home. Denies n/v. Hx of brain aneurysm repair 4/2.

## 2016-12-10 ENCOUNTER — Ambulatory Visit (INDEPENDENT_AMBULATORY_CARE_PROVIDER_SITE_OTHER): Payer: Medicare Other | Admitting: Family Medicine

## 2016-12-10 ENCOUNTER — Encounter: Payer: Self-pay | Admitting: Family Medicine

## 2016-12-10 VITALS — BP 102/68 | HR 80 | Temp 96.6°F | Resp 18 | Ht 68.0 in | Wt 182.1 lb

## 2016-12-10 DIAGNOSIS — H4902 Third [oculomotor] nerve palsy, left eye: Secondary | ICD-10-CM

## 2016-12-10 DIAGNOSIS — I671 Cerebral aneurysm, nonruptured: Secondary | ICD-10-CM

## 2016-12-10 DIAGNOSIS — I69398 Other sequelae of cerebral infarction: Secondary | ICD-10-CM

## 2016-12-10 DIAGNOSIS — Z298 Encounter for other specified prophylactic measures: Secondary | ICD-10-CM

## 2016-12-10 DIAGNOSIS — R269 Unspecified abnormalities of gait and mobility: Secondary | ICD-10-CM

## 2016-12-10 DIAGNOSIS — Z09 Encounter for follow-up examination after completed treatment for conditions other than malignant neoplasm: Secondary | ICD-10-CM

## 2016-12-10 MED ORDER — METOPROLOL TARTRATE 25 MG PO TABS: 25.0000 mg | ORAL_TABLET | Freq: Two times a day (BID) | ORAL | 1 refills | Status: DC

## 2016-12-10 MED ORDER — POTASSIUM CHLORIDE CRYS ER 20 MEQ PO TBCR: 20.0000 meq | EXTENDED_RELEASE_TABLET | Freq: Two times a day (BID) | ORAL | 1 refills | Status: DC

## 2016-12-10 MED ORDER — LEVETIRACETAM 500 MG PO TABS: 500.0000 mg | ORAL_TABLET | Freq: Two times a day (BID) | ORAL | 1 refills | Status: DC

## 2016-12-10 MED ORDER — HYDROCHLOROTHIAZIDE 25 MG PO TABS: 25.0000 mg | ORAL_TABLET | Freq: Every day | ORAL | 1 refills | Status: DC

## 2016-12-10 MED ORDER — ESCITALOPRAM OXALATE 20 MG PO TABS: 20.0000 mg | ORAL_TABLET | Freq: Two times a day (BID) | ORAL | 1 refills | Status: DC

## 2016-12-10 MED ORDER — PANTOPRAZOLE SODIUM 40 MG PO TBEC
40.0000 mg | DELAYED_RELEASE_TABLET | Freq: Every day | ORAL | 1 refills | Status: DC
Start: 2016-12-10 — End: 2016-12-20

## 2016-12-10 MED ORDER — LAMOTRIGINE 100 MG PO TABS: 100.0000 mg | ORAL_TABLET | Freq: Two times a day (BID) | ORAL | 1 refills | Status: DC

## 2016-12-10 MED ORDER — LISINOPRIL 30 MG PO TABS: 30.0000 mg | ORAL_TABLET | Freq: Every day | ORAL | 1 refills | Status: DC

## 2016-12-10 NOTE — Progress Notes (Signed)
Chief Complaint  Patient presents with  . Transitions Of Care   Was Hospitalized for cranial nerve palsy and found to have an aneurysm of the posterior communicating artery. Because of the acute findings and suspected bleed, she had emergency neurosurgery to clip the aneurysm. She was admitted on 11/17/2016 from [facility] with acute symptoms of photophobia and blurry vision bilateral lower extremity weakness and a left cranial nerve III palsy. She did well postoperatively and was transferred to rehabilitation on 11/22/2016 for inpatient therapy. She was discharged 5 days later on 11/27/2016. Since going home she is living with her son. She is doing well. She is independent in caring for herself. She has not had any falls. She uses a cane when out of the house. Her biggest problem is diminished vision in the left eye due to continued ptosis of the lid. No cognitive deficits. No motor deficits. She is eating and drinking well, and has gained some weight. Her blood pressure is well controlled She had some headaches. She went to the emergency room on 12/08/2016 with a headache. Repeat CT scan was benign. Her hydrocodone was refilled. She has not smoked cigarettes since her hospitalization. She has not had any alcohol to drink since her hospitalization. She is not an able to drive. Patient Active Problem List   Diagnosis Date Noted  . Vascular headache   . Seizure prophylaxis   . Weakness of left third cranial nerve   . Gait disturbance, post-stroke 11/22/2016  . Benign essential HTN   . Seizure disorder (HCC)   . Acute blood loss anemia   . Leukocytosis   . 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  . Bilateral leg pain 06/26/2016  . AAA (abdominal aortic aneurysm) without rupture (HCC) 06/26/2016  . Insomnia 06/07/2015  . Urinary incontinence 06/07/2015  . Chronic pain syndrome 03/04/2015  . Fibromyalgia 03/04/2015  . Neck pain 03/04/2015  . Cervical radiculitis  03/04/2015  . Lower back pain 03/04/2015  . Lumbar radicular pain 03/04/2015  . Major depression 07/05/2014  . Alcohol abuse 07/05/2014  . Liver cyst 03/18/2014  . Claudication, class I (HCC) 03/18/2013  . Smoker unmotivated to quit     Outpatient Encounter Prescriptions as of 12/10/2016  Medication Sig  . acetaminophen (TYLENOL) 325 MG tablet Take 1-2 tablets (325-650 mg total) by mouth every 4 (four) hours as needed for mild pain.  Marland Kitchen docusate sodium (COLACE) 100 MG capsule Take 100 mg by mouth daily as needed for mild constipation (constipation).   Marland Kitchen escitalopram (LEXAPRO) 20 MG tablet Take 1 tablet (20 mg total) by mouth 2 (two) times daily.  Marland Kitchen gabapentin (NEURONTIN) 300 MG capsule Take 2 capsules (600 mg total) by mouth 3 (three) times daily.  . hydrochlorothiazide (HYDRODIURIL) 25 MG tablet Take 1 tablet (25 mg total) by mouth daily.  Marland Kitchen HYDROcodone-acetaminophen (NORCO/VICODIN) 5-325 MG tablet Take 1 tablet by mouth every 4 (four) hours as needed.  . lamoTRIgine (LAMICTAL) 100 MG tablet Take 1 tablet (100 mg total) by mouth 2 (two) times daily.  Marland Kitchen levETIRAcetam (KEPPRA) 500 MG tablet Take 1 tablet (500 mg total) by mouth 2 (two) times daily.  Marland Kitchen lisinopril (PRINIVIL,ZESTRIL) 30 MG tablet Take 1 tablet (30 mg total) by mouth daily.  . metoprolol tartrate (LOPRESSOR) 25 MG tablet Take 1 tablet (25 mg total) by mouth 2 (two) times daily.  . pantoprazole (PROTONIX) 40 MG tablet Take 1 tablet (40 mg total) by mouth daily.  . potassium chloride SA (K-DUR,KLOR-CON)  20 MEQ tablet Take 1 tablet (20 mEq total) by mouth 2 (two) times daily.  Marland Kitchen senna-docusate (SENOKOT-S) 8.6-50 MG tablet Take 2 tablets by mouth at bedtime.  . traZODone (DESYREL) 150 MG tablet Take 1 tablet (150 mg total) by mouth at bedtime.   No facility-administered encounter medications on file as of 12/10/2016.     Allergies  Allergen Reactions  . Prozac [Fluoxetine Hcl] Other (See Comments)    Headaches    Review of  Systems  Constitutional: Negative for activity change, appetite change and fatigue.  HENT: Negative for congestion and dental problem.   Eyes: Positive for visual disturbance.  Respiratory: Negative for cough and shortness of breath.   Cardiovascular: Negative for chest pain, palpitations and leg swelling.  Gastrointestinal: Negative for constipation and diarrhea.  Genitourinary: Negative for difficulty urinating.  Musculoskeletal: Negative for arthralgias and back pain.  Neurological: Positive for facial asymmetry and headaches.  Psychiatric/Behavioral: Negative for confusion and dysphoric mood.    BP 102/68 (BP Location: Right Arm, Patient Position: Sitting, Cuff Size: Normal)   Pulse 80   Temp (!) 96.6 F (35.9 C) (Temporal)   Resp 18   Ht  (1.727 m)   Wt 182 lb 1.9 oz (82.6 kg)   SpO2 99%   BMI 27.69 kg/m   Physical Exam  Constitutional: She is oriented to person, place, and time. She appears well-developed and well-nourished. No distress.  HENT:  Head: Normocephalic.  Well-healed scar left hairline midline to ear Mild weakness left face with drooping left side of mouth  Eyes: Conjunctivae are normal. Pupils are equal, round, and reactive to light.  Ptosis, left eye closed with some swelling and hyperpigmentation of lid  Neck: Normal range of motion.  No bruit  Cardiovascular: Normal rate, regular rhythm and normal heart sounds.   Pulmonary/Chest: Effort normal and breath sounds normal.  Lymphadenopathy:    She has no cervical adenopathy.  Neurological: She is alert and oriented to person, place, and time. She displays normal reflexes. No cranial nerve deficit. Coordination normal.  Psychiatric: She has a normal mood and affect. Her behavior is normal.    ASSESSMENT/PLAN:  1. Aneurysm of posterior communicating artery  2. Intracranial aneurysm  3. Weakness of left third cranial ne  4. Seizure prophylaxis  5. Gait disturbance, post-stroke  6. Hospital  Discharge Follow up  Patient Instructions  Medicine refilled Stay as active as you can manage Call for problems or refills See me every 6 months  Letter    Eustace Moore, MD

## 2016-12-10 NOTE — Patient Instructions (Signed)
Medicine refilled Stay as active as you can manage Call for problems or refills See me every 6 months  Letter

## 2016-12-18 ENCOUNTER — Other Ambulatory Visit: Payer: Self-pay

## 2016-12-20 ENCOUNTER — Telehealth: Payer: Self-pay

## 2016-12-20 ENCOUNTER — Other Ambulatory Visit: Payer: Self-pay

## 2016-12-20 MED ORDER — PANTOPRAZOLE SODIUM 40 MG PO TBEC: 40.0000 mg | DELAYED_RELEASE_TABLET | Freq: Every day | ORAL | 1 refills | Status: DC

## 2016-12-20 MED ORDER — LISINOPRIL 30 MG PO TABS: 30.0000 mg | ORAL_TABLET | Freq: Every day | ORAL | 1 refills | Status: DC

## 2016-12-20 MED ORDER — LAMOTRIGINE 100 MG PO TABS: 100.0000 mg | ORAL_TABLET | Freq: Two times a day (BID) | ORAL | 1 refills | Status: DC

## 2016-12-20 MED ORDER — LEVETIRACETAM 500 MG PO TABS: 500.0000 mg | ORAL_TABLET | Freq: Two times a day (BID) | ORAL | 1 refills | Status: DC

## 2016-12-20 MED ORDER — ESCITALOPRAM OXALATE 20 MG PO TABS: 20.0000 mg | ORAL_TABLET | Freq: Two times a day (BID) | ORAL | 1 refills | Status: DC

## 2016-12-20 MED ORDER — POTASSIUM CHLORIDE CRYS ER 20 MEQ PO TBCR: 20.0000 meq | EXTENDED_RELEASE_TABLET | Freq: Two times a day (BID) | ORAL | 1 refills | Status: DC

## 2016-12-20 MED ORDER — METOPROLOL TARTRATE 25 MG PO TABS: 25.0000 mg | ORAL_TABLET | Freq: Two times a day (BID) | ORAL | 1 refills | Status: DC

## 2016-12-20 MED ORDER — HYDROCHLOROTHIAZIDE 25 MG PO TABS: 25.0000 mg | ORAL_TABLET | Freq: Every day | ORAL | 1 refills | Status: DC

## 2016-12-20 NOTE — Telephone Encounter (Signed)
rxs printed and sent to pharmacy

## 2016-12-21 ENCOUNTER — Ambulatory Visit (HOSPITAL_BASED_OUTPATIENT_CLINIC_OR_DEPARTMENT_OTHER): Payer: Medicare Other | Admitting: Physical Medicine & Rehabilitation

## 2016-12-21 ENCOUNTER — Encounter: Payer: Medicare Other | Attending: Physical Medicine & Rehabilitation

## 2016-12-21 ENCOUNTER — Encounter: Payer: Self-pay | Admitting: Physical Medicine & Rehabilitation

## 2016-12-21 VITALS — BP 144/85 | HR 63 | Resp 14

## 2016-12-21 DIAGNOSIS — E785 Hyperlipidemia, unspecified: Secondary | ICD-10-CM | POA: Diagnosis not present

## 2016-12-21 DIAGNOSIS — F419 Anxiety disorder, unspecified: Secondary | ICD-10-CM | POA: Insufficient documentation

## 2016-12-21 DIAGNOSIS — Z87891 Personal history of nicotine dependence: Secondary | ICD-10-CM | POA: Insufficient documentation

## 2016-12-21 DIAGNOSIS — F329 Major depressive disorder, single episode, unspecified: Secondary | ICD-10-CM | POA: Insufficient documentation

## 2016-12-21 DIAGNOSIS — G441 Vascular headache, not elsewhere classified: Secondary | ICD-10-CM

## 2016-12-21 DIAGNOSIS — G40909 Epilepsy, unspecified, not intractable, without status epilepticus: Secondary | ICD-10-CM | POA: Insufficient documentation

## 2016-12-21 DIAGNOSIS — Z298 Encounter for other specified prophylactic measures: Secondary | ICD-10-CM | POA: Insufficient documentation

## 2016-12-21 DIAGNOSIS — I10 Essential (primary) hypertension: Secondary | ICD-10-CM | POA: Insufficient documentation

## 2016-12-21 DIAGNOSIS — I671 Cerebral aneurysm, nonruptured: Secondary | ICD-10-CM | POA: Insufficient documentation

## 2016-12-21 DIAGNOSIS — M797 Fibromyalgia: Secondary | ICD-10-CM | POA: Diagnosis not present

## 2016-12-21 NOTE — Progress Notes (Signed)
Subjective:    Patient ID: Jennifer Jacobs, female    DOB: 11-23-1945, 71 y.o.   MRN: [id number] 70 y.o.femalewith history of HTN, fibromyalgia, seizure disorder who was admitted via [facility] 11/17/16 with acute onset of headache, photophobia and blurry vision progressing BLE weakness and left CN III palsy. She was found to have left MCA aneurysm and left posterior communicating artery aneurysm. She underwent left crani for aneurysm clipping by Dr. Cyndy Freeze. Post op had agitation due to morphine. CT head 4/3 with expected surgical changes and showing aneurysm clips. Therapy ongoing and patient limited by decrease in balance with impulsivity, STM deficits and decreased awareness of deficits with poor safety.  Admit date: 11/22/2016 Discharge date: 11/27/2016  HPI Dressing and bathing self No falls No seizure, since discharge from the hospital Patient is unclear whether she had a seizure around the perioperative time. She has been staying with her son. Her son states that the neurosurgeon told her not to drive.  ED visit on 4/21 for headache, repeat CT scan showed no evidence of acute abnormality. Has followed up with primary care physician. Has not followed up with neurosurgery.  Patient states she would like to return to driving, as well as back to work as a Programmer, applications.  Pain Inventory Average Pain 0 Pain Right Now 0 My pain is no pain  In the last 24 hours, has pain interfered with the following? General activity 0 Relation with others 0 Enjoyment of life 0 What TIME of day is your pain at its worst? no pain Sleep (in general) Good  Pain is worse with: no pain Pain improves with: no pain Relief from Meds: no pain  Mobility walk without assistance how many minutes can you walk? 5 ability to climb steps?  yes do you drive?  no Do you have any goals in this area?  yes  Function not employed: date last employed . disabled: date disabled . I need assistance with the  following:  shopping Do you have any goals in this area?  yes  Neuro/Psych bowel control problems  Prior Studies hospital f/u  Physicians involved in your care hospital f/u   Family History  Problem Relation Age of Onset  . Heart attack Father 14  . Hypertension Father   . Alcohol abuse Father   . Heart attack Maternal Grandmother   . Cancer Maternal Grandfather     type unknown  . Cancer Paternal Grandmother     type unknown  . Depression Mother   . Alcohol abuse Mother   . Ovarian cancer Paternal Aunt   . Cancer Paternal Aunt     ovarian  . Prostate cancer Paternal Uncle   . Stomach cancer Paternal Aunt   . Diabetes Daughter   . Heart disease Daughter 30    heart attack  . Depression Sister   . Alcohol abuse Brother   . Depression Sister   . Alcohol abuse Sister   . Depression Sister   . Alcohol abuse Sister   . Alcohol abuse Brother   . Alcohol abuse Son    Social History   Social History  . Marital status: Legally Separated    Spouse name: N/A  . Number of children: 4  . Years of education: 9   Occupational History  . 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 Smoking status: Former Smoker    Packs/day: 0.10    Types: Cigarettes  Quit date: 10/12/2016  . Smokeless tobacco: Never Used     Comment: one pack every 3 months  . Alcohol use No     Comment: occasionally  . Drug use: Yes    Frequency: 2.0 times per week    Types: Marijuana     Comment: occasionally  . Sexual activity: Yes    Partners: Male    Birth control/ protection: None   Other Topics Concern  . None   Social History Narrative   Lives alone with dog   Works as a Microbiologist     Does drink about 5 alcoholic beverages a week.            Husband: Coralyn Mark - separated   Daughters: Gilmore Laroche (passed away) , Novella Rob   Past Surgical History:  Procedure Laterality Date  . CRANIOTOMY Left 11/19/2016   Procedure: CRANIOTOMY INTRACRANIAL FOR   ANEURYSM CLIPPING;  Surgeon: Kevan Ny Ditty, MD;  Location: [place];  Service: Neurosurgery;  Laterality: Left;  . ELBOW ARTHROPLASTY Left    rods  . Exercise tolerance test  04/27/2013   CPET-MET: Submaximal effort (0.96, with goal of greater than 1.09) -- patient states that her effort was limited due to knee pain.  This makes the rest of the interpretation difficult: Peak VO2 - 10.5 (59% moderate), peak 02-pulse -  7..11 (low); heart rate 114 (73% -chronic incompetence considered);; PFTs normal   . MOUTH SURGERY    . NM MYOVIEW LTD  05/19/2013   LexiScan: EF 80%, no evidence of ischemia or infarction  . TRANSTHORACIC ECHOCARDIOGRAM  03/25/2013   EF 55-60%, no regional wall motion abnormalities; normal diastolic parameters, normal pulmonary pressures.  No valvular lesions noted.  Essentially normal echo    Past Medical History:  Diagnosis Date  . Acute blood loss anemia   . Allergy   . Anxiety   . Anxiety and depression   . Arthritis   . Cognitive deficit due to recent stroke 11/22/2016  . Depression   . Dyslipidemia   . Fibromyalgia   . Hepatic cyst   . Hyperlipidemia   . Hypertension   . MI (myocardial infarction) ([place]) 2007   By patient report, not substantiated by recent nuclear stress test.  . Neuromuscular disorder ([place])   . Seizure disorder ([place])    none since 2002  . Seizures ([place])   . Smoker unmotivated to quit    Quit for 3 years and then restarted  . Substance abuse    ETOH  . Vision abnormalities    BP (!) 144/85   Pulse 63   Resp 14   SpO2 97%   Opioid Risk Score:   Fall Risk Score:  `1  Depression screen PHQ 2/9  Depression screen [facility] 2/9 12/21/2016 12/10/2016 09/10/2016 09/10/2016 06/05/2016  Decreased Interest 0 0 0 0 3  Down, Depressed, Hopeless 0 0 0 0 3  PHQ - 2 Score 0 0 0 0 6  Altered sleeping 0 - - - 3  Tired, decreased energy 0 - - - 3  Change in appetite 0 - - - 1  Feeling bad or failure about yourself  0 - - - 1  Trouble concentrating 0 -  - - 3  Moving slowly or fidgety/restless 0 - - - 0  Suicidal thoughts 0 - - - 0  PHQ-9 Score 0 - - - 17  Difficult doing work/chores Not difficult at all - - - -  Review of Systems  Constitutional: Negative.   HENT: Negative.   Eyes: Negative.   Respiratory: Negative.   Cardiovascular: Positive for leg swelling.  Gastrointestinal: Positive for constipation.  Endocrine: Negative.   Genitourinary: Negative.   Musculoskeletal: Negative.   Skin: Negative.   Allergic/Immunologic: Negative.   Neurological: Negative.   Hematological: Negative.   Psychiatric/Behavioral: Negative.   All other systems reviewed and are negative.      Objective:   Physical Exam  Constitutional: She is oriented to person, place, and time. She appears well-developed and well-nourished. No distress.  HENT:  Head: Normocephalic and atraumatic.  Eyes: EOM are normal. Pupils are equal, round, and reactive to light.  Neck: Normal range of motion.  Cardiovascular: Normal rate, regular rhythm and normal heart sounds.  Exam reveals no friction rub.   No murmur heard. Pulmonary/Chest: Effort normal and breath sounds normal. No respiratory distress. She has no wheezes.  Abdominal: Soft. Bowel sounds are normal. She exhibits no distension. There is no tenderness.  Musculoskeletal: She exhibits no edema or deformity.  Neurological: She is alert and oriented to person, place, and time. She displays no atrophy and no tremor. No sensory deficit. She exhibits normal muscle tone. Coordination and gait normal.  Gait is normal. She can even jog without loss of balance.  Skin: She is not diaphoretic.  Psychiatric: She has a normal mood and affect. Her behavior is normal. Judgment and thought content normal.  Nursing note and vitals reviewed.  Left eye ptosis. Left pupil lateral deviation.         Assessment & Plan:  1. Status post left MCA/PCA aneurysm repair. She has persistent cranial nerve III palsy. She has  regained normal mobility. She is independent with her self-care. She will finish out her home health, but will not require outpatient therapy. Patient may stay at her place now. I recommended initially for 2 days and then if this is going well to continue on her own with her son checking up on her.  I recommend no driving until cleared by neurosurgery I recommend no return to work until cleared by neurosurgery  Patient can refill her medications for one month and then her primary care physician will resume prescribing her medications. May discontinue Protonix, and if heartburn returns would resume  Neurosurgery will need to determine duration of Keppra  Physical medicine and rehabilitation follow-up on as-needed basis  Discussed with patient and her son have answered questions.

## 2016-12-21 NOTE — Patient Instructions (Addendum)
        Loura HaltBenjamin Jared Ditty, MD. Call in 1 day(s).   Specialty:  Neurosurgery Why:  for follow up appointment Contact information: [address] [place] [address] [phone]   May stop pantoprazole (Protonix) , may restart if stomach starts bothering you - heartburn  May refill your other meds but further refill will need to come from Dr Delton SeeNelson  Seizure medicine Levaceteram (Keppra) should be managed by Dr Bevely Palmeritty or a neurologist

## 2016-12-24 ENCOUNTER — Other Ambulatory Visit: Payer: Self-pay

## 2016-12-25 ENCOUNTER — Other Ambulatory Visit: Payer: Self-pay

## 2016-12-25 MED ORDER — ESCITALOPRAM OXALATE 20 MG PO TABS: 20.0000 mg | ORAL_TABLET | Freq: Every day | ORAL | 1 refills | Status: DC

## 2017-01-01 ENCOUNTER — Telehealth: Payer: Self-pay

## 2017-01-01 NOTE — Telephone Encounter (Signed)
Patient has LVM asking to refill a Rx she has received from Dr.Ditty. Patient did not leave medication name nor dosage. returned patient phone call. Patient did not answer. LVM to return our call

## 2017-01-01 NOTE — Telephone Encounter (Signed)
Called patient back, she needed clarification on who was to refill her 2 blood pressure meds, potasium medication, and stool softness, she was informed that it would be her PCP who will refill those meds.

## 2017-01-02 ENCOUNTER — Other Ambulatory Visit: Payer: Self-pay

## 2017-01-02 MED ORDER — POTASSIUM CHLORIDE CRYS ER 20 MEQ PO TBCR: 20.0000 meq | EXTENDED_RELEASE_TABLET | Freq: Two times a day (BID) | ORAL | 1 refills | Status: DC

## 2017-01-02 MED ORDER — LISINOPRIL 30 MG PO TABS: 30.0000 mg | ORAL_TABLET | Freq: Every day | ORAL | 1 refills | Status: DC

## 2017-01-02 MED ORDER — METOPROLOL TARTRATE 25 MG PO TABS: 25.0000 mg | ORAL_TABLET | Freq: Two times a day (BID) | ORAL | 1 refills | Status: DC

## 2017-01-02 MED ORDER — DOCUSATE SODIUM 100 MG PO CAPS: 100.0000 mg | ORAL_CAPSULE | Freq: Every day | ORAL | 1 refills | Status: DC | PRN

## 2017-01-02 MED ORDER — DOCUSATE SODIUM 100 MG PO CAPS: 100.0000 mg | ORAL_CAPSULE | Freq: Two times a day (BID) | ORAL | 0 refills | Status: DC

## 2017-01-22 ENCOUNTER — Ambulatory Visit (HOSPITAL_COMMUNITY): Payer: Self-pay | Admitting: Psychiatry

## 2017-01-28 ENCOUNTER — Other Ambulatory Visit: Payer: Self-pay

## 2017-01-28 MED ORDER — GABAPENTIN 300 MG PO CAPS: 600.0000 mg | ORAL_CAPSULE | Freq: Three times a day (TID) | ORAL | 1 refills | Status: DC

## 2017-01-29 DIAGNOSIS — H04123 Dry eye syndrome of bilateral lacrimal glands: Secondary | ICD-10-CM | POA: Diagnosis not present

## 2017-01-29 DIAGNOSIS — H5022 Vertical strabismus, left eye: Secondary | ICD-10-CM | POA: Diagnosis not present

## 2017-01-29 DIAGNOSIS — H2513 Age-related nuclear cataract, bilateral: Secondary | ICD-10-CM | POA: Diagnosis not present

## 2017-01-29 DIAGNOSIS — H5213 Myopia, bilateral: Secondary | ICD-10-CM | POA: Diagnosis not present

## 2017-02-04 ENCOUNTER — Telehealth: Payer: Self-pay | Admitting: *Deleted

## 2017-02-04 NOTE — Telephone Encounter (Signed)
Patient called left message on nurses line stating she is having a medication reaction and per pt her eye doctor thinks it is coming from a medication that she is on, patient did not leave a name of medication she is having a reaction too. Please advise [phone]

## 2017-02-04 NOTE — Telephone Encounter (Signed)
Selena, find out the medicne, the reaction, or the eye doctors name to investigate.  thanks

## 2017-02-05 ENCOUNTER — Other Ambulatory Visit: Payer: Self-pay | Admitting: Family Medicine

## 2017-02-05 NOTE — Telephone Encounter (Signed)
Called and spoke to pt, states she has been having diarrhea and thinks it might be coming from her meds, but unsure which one. Pt advised to bring meds to office so we can go through them together.

## 2017-02-11 ENCOUNTER — Other Ambulatory Visit: Payer: Self-pay | Admitting: Physical Medicine and Rehabilitation

## 2017-02-14 ENCOUNTER — Telehealth: Payer: Self-pay | Admitting: Family Medicine

## 2017-02-14 NOTE — Telephone Encounter (Signed)
Patient requesting Rx Trazodone.  Please call in @ [facility]

## 2017-02-15 NOTE — Telephone Encounter (Signed)
No.  She cancelled her last appt with Dr Tenny Crawoss and failed to re schedule

## 2017-02-15 NOTE — Telephone Encounter (Signed)
Jennifer Jacobs has been rxing this, do u want to refill?

## 2017-02-19 ENCOUNTER — Telehealth: Payer: Self-pay | Admitting: *Deleted

## 2017-02-19 NOTE — Telephone Encounter (Signed)
Noted  

## 2017-02-19 NOTE — Telephone Encounter (Signed)
Patient left message on nurse line to refill trazodone. [phone]

## 2017-02-19 NOTE — Telephone Encounter (Signed)
I have attempted to call Jeronica as deb ross writes her trazodone, her voice mail is not set up..Marland Kitchen

## 2017-02-21 ENCOUNTER — Other Ambulatory Visit (HOSPITAL_COMMUNITY): Payer: Self-pay | Admitting: *Deleted

## 2017-02-21 MED ORDER — TRAZODONE HCL 150 MG PO TABS: 150.0000 mg | ORAL_TABLET | Freq: Every day | ORAL | 2 refills | Status: DC

## 2017-02-21 NOTE — Telephone Encounter (Signed)
[facility]

## 2017-02-21 NOTE — Progress Notes (Unsigned)
Spoke with [facility]

## 2017-03-29 ENCOUNTER — Emergency Department (HOSPITAL_COMMUNITY)
Admission: EM | Admit: 2017-03-29 | Discharge: 2017-03-29 | Disposition: A | Discharge status: Home / Self Care | Payer: Medicare Other | Attending: Emergency Medicine | Admitting: Emergency Medicine

## 2017-03-29 ENCOUNTER — Emergency Department (HOSPITAL_COMMUNITY): Payer: Medicare Other

## 2017-03-29 ENCOUNTER — Encounter (HOSPITAL_COMMUNITY): Payer: Self-pay | Admitting: Emergency Medicine

## 2017-03-29 DIAGNOSIS — I252 Old myocardial infarction: Secondary | ICD-10-CM | POA: Diagnosis not present

## 2017-03-29 DIAGNOSIS — F17210 Nicotine dependence, cigarettes, uncomplicated: Secondary | ICD-10-CM | POA: Insufficient documentation

## 2017-03-29 DIAGNOSIS — R42 Dizziness and giddiness: Secondary | ICD-10-CM | POA: Insufficient documentation

## 2017-03-29 DIAGNOSIS — Z7982 Long term (current) use of aspirin: Secondary | ICD-10-CM | POA: Diagnosis not present

## 2017-03-29 DIAGNOSIS — Z79899 Other long term (current) drug therapy: Secondary | ICD-10-CM | POA: Insufficient documentation

## 2017-03-29 DIAGNOSIS — I10 Essential (primary) hypertension: Secondary | ICD-10-CM | POA: Diagnosis not present

## 2017-03-29 MED ORDER — MECLIZINE HCL 12.5 MG PO TABS: 12.5000 mg | ORAL_TABLET | Freq: Three times a day (TID) | ORAL | 0 refills | Status: DC | PRN

## 2017-03-29 NOTE — ED Triage Notes (Signed)
Patient complaining of constant dizziness x 2 days. States it is worse when standing. Denies pain.

## 2017-03-29 NOTE — Discharge Instructions (Signed)
Sure that you are getting plenty of rest, and drinking a lot of fluids.  Use the prescription medicine for dizziness if needed, but be careful because it will make you sleepy.

## 2017-03-29 NOTE — ED Triage Notes (Signed)
Dizzy for the last 2 days   Dr Delton SeeNelson is PCP   Has not consulted

## 2017-03-29 NOTE — ED Provider Notes (Signed)
[facility] DEPT Provider Note   CSN: [id number] Arrival date & time: 03/29/17  1228     History   Chief Complaint Chief Complaint  Patient presents with  . Dizziness    HPI Jennifer Jacobs is a 71 y.o. female.  She complains of dizziness for 3 days, with difficulty walking, causing her to drift to the left.  The dizziness is characterized by spinning sensation.  She is concerned that she has another intracranial bleed from an aneurysm.  She denies headache, blurred vision, double vision, nausea, vomiting, diaphoresis, chest pain, palpitations, weakness or paresthesia.  There are no other known modifying factors.  HPI  Past Medical History:  Diagnosis Date  . Acute blood loss anemia   . Allergy   . Anxiety   . Anxiety and depression   . Arthritis   . Cognitive deficit due to recent stroke 11/22/2016  . Depression   . Dyslipidemia   . Fibromyalgia   . Hepatic cyst   . Hyperlipidemia   . Hypertension   . MI (myocardial infarction) ([place]) 2007   By patient report, not substantiated by recent nuclear stress test.  . Neuromuscular disorder ([place])   . Seizure disorder ([place])    none since 2002  . Seizures ([place])   . Smoker unmotivated to quit    Quit for 3 years and then restarted  . Substance abuse    ETOH  . Vision abnormalities     Patient Active Problem List   Diagnosis Date Noted  . Vascular headache   . Seizure prophylaxis   . Weakness of left third cranial nerve   . Gait disturbance, post-stroke 11/22/2016  . Benign essential HTN   . Seizure disorder ([place])   . Acute blood loss anemia   . Leukocytosis   . 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  . Bilateral leg pain 06/26/2016  . AAA (abdominal aortic aneurysm) without rupture ([place]) 06/26/2016  . Insomnia 06/07/2015  . Urinary incontinence 06/07/2015  . Chronic pain syndrome 03/04/2015  . Fibromyalgia 03/04/2015  . Neck pain 03/04/2015  . Cervical radiculitis 03/04/2015  . Lower back  pain 03/04/2015  . Lumbar radicular pain 03/04/2015  . Major depression 07/05/2014  . Alcohol abuse 07/05/2014  . Liver cyst 03/18/2014  . Claudication, class I ([facility]) 03/18/2013  . Smoker unmotivated to quit     Past Surgical History:  Procedure Laterality Date  . CRANIOTOMY Left 11/19/2016   Procedure: CRANIOTOMY INTRACRANIAL FOR  ANEURYSM CLIPPING;  Surgeon: Kevan Ny Ditty, MD;  Location: [facility];  Service: Neurosurgery;  Laterality: Left;  . ELBOW ARTHROPLASTY Left    rods  . Exercise tolerance test  04/27/2013   CPET-MET: Submaximal effort (0.96, with goal of greater than 1.09) -- patient states that her effort was limited due to knee pain.  This makes the rest of the interpretation difficult: Peak VO2 - 10.5 (59% moderate), peak 02-pulse -  7..11 (low); heart rate 114 (73% -chronic incompetence considered);; PFTs normal   . MOUTH SURGERY    . NM MYOVIEW LTD  05/19/2013   LexiScan: EF 80%, no evidence of ischemia or infarction  . TRANSTHORACIC ECHOCARDIOGRAM  03/25/2013   EF 55-60%, no regional wall motion abnormalities; normal diastolic parameters, normal pulmonary pressures.  No valvular lesions noted.  Essentially normal echo     OB History    No data available       Home Medications    Prior to Admission medications   Medication Sig Start Date  End Date Taking? Authorizing Provider  aspirin EC 81 MG tablet Take 81 mg by mouth daily.   Yes [provider]  docusate sodium (COLACE) 100 MG capsule Take 100 mg by mouth daily as needed for mild constipation.   Yes [provider]  escitalopram (LEXAPRO) 20 MG tablet Take 1 tablet (20 mg total) by mouth daily. 12/25/16  Yes Raylene Everts, MD  gabapentin (NEURONTIN) 300 MG capsule Take 2 capsules (600 mg total) by mouth 3 (three) times daily. 01/28/17  Yes Raylene Everts, MD  hydrochlorothiazide (HYDRODIURIL) 25 MG tablet Take 1 tablet (25 mg total) by mouth daily. 12/20/16  Yes Raylene Everts, MD    Ibuprofen-Diphenhydramine HCl (ADVIL PM) 200-25 MG CAPS Take 1 capsule by mouth at bedtime.   Yes [provider]  lamoTRIgine (LAMICTAL) 100 MG tablet Take 1 tablet (100 mg total) by mouth 2 (two) times daily. 12/20/16 12/20/17 Yes Raylene Everts, MD  levETIRAcetam (KEPPRA) 500 MG tablet Take 1 tablet (500 mg total) by mouth 2 (two) times daily. 12/20/16  Yes Raylene Everts, MD  lisinopril (PRINIVIL,ZESTRIL) 30 MG tablet Take 1 tablet (30 mg total) by mouth daily. 01/02/17  Yes Raylene Everts, MD  metoprolol tartrate (LOPRESSOR) 25 MG tablet Take 1 tablet (25 mg total) by mouth 2 (two) times daily. 01/02/17  Yes Raylene Everts, MD  naproxen sodium (ANAPROX) 220 MG tablet Take 220 mg by mouth daily as needed (pain).   Yes [provider]  potassium chloride SA (K-DUR,KLOR-CON) 20 MEQ tablet Take 1 tablet (20 mEq total) by mouth 2 (two) times daily. 01/02/17  Yes Raylene Everts, MD  acetaminophen (TYLENOL) 325 MG tablet Take 1-2 tablets (325-650 mg total) by mouth every 4 (four) hours as needed for mild pain. Patient not taking: Reported on 03/29/2017 11/27/16   Love, Ivan Anchors, PA-C  meclizine (ANTIVERT) 12.5 MG tablet Take 1 tablet (12.5 mg total) by mouth 3 (three) times daily as needed for dizziness. 03/29/17   Daleen Bo, MD  traZODone (DESYREL) 150 MG tablet Take 1 tablet (150 mg total) by mouth at bedtime. Patient not taking: Reported on 03/29/2017 02/21/17   Cloria Spring, MD    Family History Family History  Problem Relation Age of Onset  . Heart attack Father 73  . Hypertension Father   . Alcohol abuse Father   . Heart attack Maternal Grandmother   . Cancer Maternal Grandfather        type unknown  . Cancer Paternal Grandmother        type unknown  . Depression Mother   . Alcohol abuse Mother   . Ovarian cancer Paternal Aunt   . Cancer Paternal Aunt        ovarian  . Prostate cancer Paternal Uncle   . Stomach cancer Paternal Aunt   . Diabetes  Daughter   . Heart disease Daughter 41       heart attack  . Depression Sister   . Alcohol abuse Brother   . Depression Sister   . Alcohol abuse Sister   . Depression Sister   . Alcohol abuse Sister   . Alcohol abuse Brother   . Alcohol abuse Son     Social History Social History  Substance Use Topics  . Smoking status: Current Every Day Smoker    Packs/day: 0.10    Types: Cigarettes    Last attempt to quit: 10/12/2016  . Smokeless tobacco: Never Used  Comment: one pack every 3 months  . Alcohol use No     Comment: occasionally     Allergies   Prozac [fluoxetine hcl]   Review of Systems Review of Systems  All other systems reviewed and are negative.    Physical Exam Updated Vital Signs BP (!) 86/61   Pulse 67   Temp 98.3 F (36.8 C) (Oral)   Resp 19   Ht '5\' 8"'  (1.727 m)   Wt 87.1 kg (192 lb)   SpO2 94%   BMI 29.19 kg/m   Physical Exam  Constitutional: She is oriented to person, place, and time. She appears well-developed and well-nourished.  HENT:  Head: Normocephalic and atraumatic.  Eyes: Pupils are equal, round, and reactive to light. Conjunctivae and EOM are normal.  Neck: Normal range of motion and phonation normal. Neck supple.  Cardiovascular: Normal rate and regular rhythm.   Pulmonary/Chest: Effort normal and breath sounds normal. She exhibits no tenderness.  Abdominal: Soft. She exhibits no distension. There is no tenderness. There is no guarding.  Musculoskeletal: Normal range of motion.  Neurological: She is alert and oriented to person, place, and time. She exhibits normal muscle tone. Coordination normal.  No dysarthria or aphasia.  Nystagmus present left beating, with left lateral gaze.  Negative test of skew.  Negative head impulse testing.  Normal finger to nose, and heel to shin, bilaterally.  Skin: Skin is warm and dry.  Psychiatric: She has a normal mood and affect. Her behavior is normal. Judgment and thought content normal.    Nursing note and vitals reviewed.    ED Treatments / Results  Labs (all labs ordered are listed, but only abnormal results are displayed) Labs Reviewed - No data to display  EKG  EKG Interpretation  Date/Time:  Friday March 29 2017 12:44:15 EDT Ventricular Rate:  83 PR Interval:    QRS Duration: 112 QT Interval:  387 QTC Calculation: 455 R Axis:   -74 Text Interpretation:  Sinus rhythm Incomplete left bundle branch block since last tracing no significant change Confirmed by Daleen Bo [phone]) on 03/29/2017 12:51:47 PM       Radiology Ct Head Wo Contrast  Result Date: 03/29/2017 CLINICAL DATA:  Dizziness for 2 days. Personal history of brain aneurysms. Two were clipped April 2018. EXAM: CT HEAD WITHOUT CONTRAST TECHNIQUE: Contiguous axial images were obtained from the base of the skull through the vertex without intravenous contrast. COMPARISON:  CTA of the head and neck 11/18/2016. FINDINGS: Brain: Left MCA and posterior communicating artery aneurysm clips are in place following left pterional craniotomy. No acute infarct, hemorrhage, or mass lesion is present. There is no significant white matter disease. The basal ganglia are intact. The brainstem and cerebellum are normal. Ventricles are of normal size. No significant extra-axial fluid collection is present. Vascular: Atherosclerotic calcifications are present within the cavernous internal carotid artery bilaterally. There is no hyperdense vessel. Skull: Apart from the craniotomy, the calvarium is unremarkable. No focal lytic or blastic lesions are present. Sinuses/Orbits: The paranasal sinuses and mastoid air cells are clear. The globes and orbits are within normal limits. IMPRESSION: 1. Interval clipping of left MCA and left posterior communicating artery aneurysms. 2. Stable and unremarkable CT of the brain otherwise. No acute or focal abnormality to explain dizziness. Electronically Signed   By: San Morelle M.D.   On:  03/29/2017 15:20    Procedures Procedures (including critical care time)  Medications Ordered in ED Medications - No data to display  Initial Impression / Assessment and Plan / ED Course  I have reviewed the triage vital signs and the nursing notes.  Pertinent labs & imaging results that were available during my care of the patient were reviewed by me and considered in my medical decision making (see chart for details).      Patient Vitals for the past 24 hrs:  BP Temp Temp src Pulse Resp SpO2 Height Weight  03/29/17 1430 - - - 67 19 94 % - -  03/29/17 1400 - - - 64 14 96 % - -  03/29/17 1345 - - - 68 (!) 23 95 % - -  03/29/17 1330 (!) 86/61 - - 72 18 93 % - -  03/29/17 1300 (!) 86/63 - - 70 13 92 % - -  03/29/17 1234 112/75 98.3 F (36.8 C) Oral 92 20 95 % '5\' 8"'  (1.727 m) 87.1 kg (192 lb)    4:01 PM Reevaluation with update and discussion. After initial assessment and treatment, an updated evaluation reveals she feels better spontaneously.  Findings discussed with patient, and husband, all questions answered. Anaston Koehn L      Final Clinical Impressions(s) / ED Diagnoses   Final diagnoses:  Dizziness   Specific dizziness, most likely benign positional vertigo, without signs for central vertigo, intracranial bleeding, serious bacterial infection, metabolic instability, or impending vascular collapse.  Nursing Notes Reviewed/ Care Coordinated Applicable Imaging Reviewed Interpretation of Laboratory Data incorporated into ED treatment  The patient appears reasonably screened and/or stabilized for discharge and I doubt any other medical condition or other [facility] requiring further screening, evaluation, or treatment in the ED at this time prior to discharge.  Plan: Home Medications-continue current medications; Home Treatments-rest, fluids, be careful in walking; return here if the recommended treatment, does not improve the symptoms; Recommended follow up-PCP, as  needed   New Prescriptions New Prescriptions   MECLIZINE (ANTIVERT) 12.5 MG TABLET    Take 1 tablet (12.5 mg total) by mouth 3 (three) times daily as needed for dizziness.     Daleen Bo, MD 03/29/17 [phone]

## 2017-04-01 ENCOUNTER — Other Ambulatory Visit: Payer: Self-pay

## 2017-04-01 MED ORDER — METOPROLOL TARTRATE 25 MG PO TABS: 25.0000 mg | ORAL_TABLET | Freq: Two times a day (BID) | ORAL | 1 refills | Status: DC

## 2017-04-01 MED ORDER — GABAPENTIN 300 MG PO CAPS: 600.0000 mg | ORAL_CAPSULE | Freq: Three times a day (TID) | ORAL | 1 refills | Status: DC

## 2017-04-03 ENCOUNTER — Other Ambulatory Visit: Payer: Self-pay | Admitting: Physician Assistant

## 2017-04-03 ENCOUNTER — Telehealth: Payer: Self-pay | Admitting: Family Medicine

## 2017-04-03 NOTE — Telephone Encounter (Signed)
Patient left message. Was in ER, was told to call PCP for next steps. [phone]

## 2017-04-04 NOTE — Telephone Encounter (Signed)
She was seen int he ER for dizziness.  Evaluated.  Treated.  Told to call me as needed for future problems.

## 2017-04-05 ENCOUNTER — Other Ambulatory Visit: Payer: Self-pay | Admitting: Physician Assistant

## 2017-04-05 DIAGNOSIS — I671 Cerebral aneurysm, nonruptured: Secondary | ICD-10-CM

## 2017-04-12 ENCOUNTER — Other Ambulatory Visit: Payer: Self-pay

## 2017-04-12 MED ORDER — HYDROCHLOROTHIAZIDE 25 MG PO TABS: 25.0000 mg | ORAL_TABLET | Freq: Every day | ORAL | 1 refills | Status: DC

## 2017-04-19 ENCOUNTER — Telehealth (HOSPITAL_COMMUNITY): Payer: Self-pay | Admitting: *Deleted

## 2017-04-19 NOTE — Telephone Encounter (Signed)
Office received fax form [place] pharmacy [facility] [place] refills for pt Escitalopram 20 mg. Per pt chart, this medication was last filled by LPN Meta HatchetPutman, Selena [facility] on 12-25-2016 with 90 tabs 1 refill. Also on fax, request is addressed to Dr. Vanetta ShawlHisada. Per pt chart, pt was last seen by Dr. Tenny Crawoss on 10-22-2016. Called pt to see if she was still interested in coming back to practice but staff was unable to reach pt. Staff called pt home number [phone] at 3:08pm and pt did not have a voicemail set up. Staff then called pt mobile at [phone] at 3:06pm on 04-19-2017 and lmtcb and office number was provided.

## 2017-04-30 ENCOUNTER — Ambulatory Visit
Admission: RE | Admit: 2017-04-30 | Discharge: 2017-04-30 | Disposition: A | Discharge status: Home / Self Care | Payer: Medicare Other | Source: Ambulatory Visit | Attending: Physician Assistant | Admitting: Physician Assistant

## 2017-04-30 DIAGNOSIS — I671 Cerebral aneurysm, nonruptured: Secondary | ICD-10-CM

## 2017-05-23 ENCOUNTER — Telehealth (HOSPITAL_COMMUNITY): Payer: Self-pay | Admitting: *Deleted

## 2017-05-23 NOTE — Telephone Encounter (Signed)
returned phone call to patient, her appointment for 05/24/17 has been cancelled as she requested.   she did not answer, left voice message.

## 2017-05-24 ENCOUNTER — Ambulatory Visit (HOSPITAL_COMMUNITY): Payer: Self-pay | Admitting: Psychiatry

## 2017-06-11 ENCOUNTER — Encounter: Payer: Self-pay | Admitting: Family Medicine

## 2017-06-11 ENCOUNTER — Ambulatory Visit (INDEPENDENT_AMBULATORY_CARE_PROVIDER_SITE_OTHER): Payer: Medicare Other | Admitting: Family Medicine

## 2017-06-11 ENCOUNTER — Other Ambulatory Visit: Payer: Self-pay

## 2017-06-11 VITALS — BP 134/84 | HR 76 | Temp 97.1°F | Resp 18 | Ht 68.0 in | Wt 212.1 lb

## 2017-06-11 DIAGNOSIS — M797 Fibromyalgia: Secondary | ICD-10-CM

## 2017-06-11 DIAGNOSIS — M5440 Lumbago with sciatica, unspecified side: Secondary | ICD-10-CM | POA: Diagnosis not present

## 2017-06-11 DIAGNOSIS — R269 Unspecified abnormalities of gait and mobility: Secondary | ICD-10-CM

## 2017-06-11 DIAGNOSIS — I10 Essential (primary) hypertension: Secondary | ICD-10-CM | POA: Diagnosis not present

## 2017-06-11 DIAGNOSIS — I69398 Other sequelae of cerebral infarction: Secondary | ICD-10-CM

## 2017-06-11 DIAGNOSIS — G8929 Other chronic pain: Secondary | ICD-10-CM | POA: Diagnosis not present

## 2017-06-11 DIAGNOSIS — Z23 Encounter for immunization: Secondary | ICD-10-CM | POA: Diagnosis not present

## 2017-06-11 MED ORDER — HYDROCHLOROTHIAZIDE 25 MG PO TABS: 25.0000 mg | ORAL_TABLET | Freq: Every day | ORAL | 1 refills | Status: DC

## 2017-06-11 MED ORDER — LISINOPRIL 30 MG PO TABS: 30.0000 mg | ORAL_TABLET | Freq: Every day | ORAL | 1 refills | Status: DC

## 2017-06-11 MED ORDER — GABAPENTIN 400 MG PO CAPS: 800.0000 mg | ORAL_CAPSULE | Freq: Three times a day (TID) | ORAL | 6 refills | Status: DC

## 2017-06-11 MED ORDER — POTASSIUM CHLORIDE CRYS ER 20 MEQ PO TBCR: 20.0000 meq | EXTENDED_RELEASE_TABLET | Freq: Two times a day (BID) | ORAL | 1 refills | Status: DC

## 2017-06-11 MED ORDER — METOPROLOL TARTRATE 25 MG PO TABS: 25.0000 mg | ORAL_TABLET | Freq: Two times a day (BID) | ORAL | 1 refills | Status: DC

## 2017-06-11 NOTE — Patient Instructions (Addendum)
Increase gabapentin to 400 mg.  Take 2 pills 3 times a day Other medicines are remaining the same Walk every day that you are able See neurosurgery in follow-up regarding your aneurysm, surgery, and seizures Try to reduce or stop the cigarettes Congratulations on quitting drinking  Need lab work and follow-up with me in 6 months Next visit is a physical

## 2017-06-11 NOTE — Progress Notes (Signed)
Chief Complaint  Patient presents with  . Follow-up    6 month  Patient is here for routine follow-up She tells me that she has returned to driving.  She was not cleared to do this by neurosurgery but has driving out of necessity.  She states she has not any seizures for over 6 months.  She is compliant with taking her Keppra. Patient is on hydrochlorothiazide, lisinopril, and metoprolol for her blood pressure.  Her blood pressure is well controlled.  She also takes potassium The patient sees psychiatry.  She is on trazodone and Lexapro and Lamictal.  She states that her depression and anxiety have been stable. Patient was seen in the emergency room for dizziness.  She still has some dizzy spells periodically.  She still has some trouble with falls.  She never did complete rehabilitation after her brain surgery for aneurysm. Patient continues to smoke cigarettes.  The importance of quitting his discussed with her. She no longer drinks any alcohol. She has gained some weight.  We discussed the importance of diet and exercise to maintain her weight. She has chronic pain from fibromyalgia.  She states her neck and back hurt every day.  She is out of her gabapentin.  She run out of gabapentin early because she was taking more than prescribed.  She is prescribed 300 mg tablets, take 2 3 times daily.  She asks for an increase in dose.  We will try 400 mg on the same schedule, and consider switching to Lyrica if she continues to complain of chronic pain.   Patient Active Problem List   Diagnosis Date Noted  . Vascular headache   . Seizure prophylaxis   . Weakness of left third cranial nerve   . Gait disturbance, post-stroke 11/22/2016  . Benign essential HTN   . Seizure disorder ([facility])   . Acute blood loss anemia   . Leukocytosis   . 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  . Bilateral leg pain 06/26/2016  . AAA (abdominal aortic aneurysm) without rupture ([facility]) 06/26/2016  .  Insomnia 06/07/2015  . Urinary incontinence 06/07/2015  . Chronic pain syndrome 03/04/2015  . Fibromyalgia 03/04/2015  . Neck pain 03/04/2015  . Cervical radiculitis 03/04/2015  . Lower back pain 03/04/2015  . Lumbar radicular pain 03/04/2015  . Major depression 07/05/2014  . Alcohol abuse 07/05/2014  . Liver cyst 03/18/2014  . Claudication, class I ([facility]) 03/18/2013  . Smoker unmotivated to quit     Outpatient Encounter Prescriptions as of 06/11/2017  Medication Sig  . acetaminophen (TYLENOL) 325 MG tablet Take 1-2 tablets (325-650 mg total) by mouth every 4 (four) hours as needed for mild pain.  Marland Kitchen aspirin EC 81 MG tablet Take 81 mg by mouth daily.  Marland Kitchen docusate sodium (COLACE) 100 MG capsule Take 100 mg by mouth daily as needed for mild constipation.  Marland Kitchen escitalopram (LEXAPRO) 20 MG tablet Take 1 tablet (20 mg total) by mouth daily.  Marland Kitchen gabapentin (NEURONTIN) 400 MG capsule Take 2 capsules (800 mg total) by mouth 3 (three) times daily.  . hydrochlorothiazide (HYDRODIURIL) 25 MG tablet Take 1 tablet (25 mg total) by mouth daily.  . Ibuprofen-Diphenhydramine HCl (ADVIL PM) 200-25 MG CAPS Take 1 capsule by mouth at bedtime.  . lamoTRIgine (LAMICTAL) 100 MG tablet Take 1 tablet (100 mg total) by mouth 2 (two) times daily.  Marland Kitchen levETIRAcetam (KEPPRA) 500 MG tablet Take 1 tablet (500 mg total) by mouth 2 (two) times daily.  Marland Kitchen lisinopril (  PRINIVIL,ZESTRIL) 30 MG tablet Take 1 tablet (30 mg total) by mouth daily.  . meclizine (ANTIVERT) 12.5 MG tablet Take 1 tablet (12.5 mg total) by mouth 3 (three) times daily as needed for dizziness.  . metoprolol tartrate (LOPRESSOR) 25 MG tablet Take 1 tablet (25 mg total) by mouth 2 (two) times daily.  . naproxen sodium (ANAPROX) 220 MG tablet Take 220 mg by mouth daily as needed (pain).  . potassium chloride SA (K-DUR,KLOR-CON) 20 MEQ tablet Take 1 tablet (20 mEq total) by mouth 2 (two) times daily.  . traZODone (DESYREL) 150 MG tablet Take 1 tablet (150 mg  total) by mouth at bedtime.   No facility-administered encounter medications on file as of 06/11/2017.     Allergies  Allergen Reactions  . Prozac [Fluoxetine Hcl] Other (See Comments)    Headaches    Review of Systems  Constitutional: Negative for activity change, appetite change and fatigue.  HENT: Negative for congestion and dental problem.   Eyes: Positive for visual disturbance.       Advised to see her eye specialist  Respiratory: Negative for cough and shortness of breath.   Cardiovascular: Negative for chest pain, palpitations and leg swelling.  Gastrointestinal: Negative for constipation and diarrhea.  Genitourinary: Negative for difficulty urinating.  Musculoskeletal: Positive for back pain, neck pain and neck stiffness. Negative for arthralgias.  Neurological: Negative for seizures, facial asymmetry and headaches.       Denies seizures  Psychiatric/Behavioral: Negative for confusion and dysphoric mood.    BP 134/84 (BP Location: Right Arm, Patient Position: Sitting, Cuff Size: Normal)   Pulse 76   Temp (!) 97.1 F (36.2 C) (Temporal)   Resp 18   Ht 5\' 8"  (1.727 m)   Wt 212 lb 1.9 oz (96.2 kg)   SpO2 97%   BMI 32.25 kg/m   Physical Exam  Constitutional: She is oriented to person, place, and time. She appears well-developed and well-nourished. No distress.  HENT:  Head: Normocephalic.  Mouth/Throat: Oropharynx is clear and moist.  Well-healed scar left hairline midline to ear.  No face asymmetry   Eyes: Pupils are equal, round, and reactive to light. Conjunctivae are normal.  Ptosis, left eye is largely resolved  Neck: Normal range of motion.  No bruit  Cardiovascular: Normal rate, regular rhythm and normal heart sounds.   Pulmonary/Chest: Effort normal and breath sounds normal.  Lymphadenopathy:    She has no cervical adenopathy.  Neurological: She is alert and oriented to person, place, and time. She displays normal reflexes. No cranial nerve deficit.  Coordination normal.  Normal gait  Psychiatric: She has a normal mood and affect. Her behavior is normal.    ASSESSMENT/PLAN:  1. Gait disturbance, post-stroke Per patient.  Continues to fall.  Daily walking as exercise is recommended.  2. Essential hypertension - CBC - COMPLETE METABOLIC PANEL WITH GFR - Lipid panel - Urinalysis, Routine w reflex microscopic  3. Chronic midline low back pain with sciatica, sciatica laterality unspecified  4. Benign essential HTN  5. Fibromyalgia Continued pain.  Increase gabapentin.  6. Needs flu shot - Flu Vaccine QUAD 36+ mos IM  Tobacco abuse as discussed. Patient Instructions  Increase gabapentin to 400 mg.  Take 2 pills 3 times a day Other medicines are remaining the same Walk every day that you are able See neurosurgery in follow-up regarding your aneurysm, surgery, and seizures Try to reduce or stop the cigarettes Congratulations on quitting drinking  Need lab work and follow-up with  me in 6 months Next visit is a physical    Eustace Moore, MD

## 2017-06-20 ENCOUNTER — Telehealth: Payer: Self-pay | Admitting: *Deleted

## 2017-06-20 NOTE — Telephone Encounter (Signed)
I have not called patient,

## 2017-06-20 NOTE — Telephone Encounter (Signed)
Patient left message stating she is returning a call. Please advise

## 2017-06-23 ENCOUNTER — Other Ambulatory Visit (HOSPITAL_COMMUNITY): Payer: Self-pay | Admitting: Psychiatry

## 2017-06-24 ENCOUNTER — Telehealth: Payer: Self-pay | Admitting: *Deleted

## 2017-06-24 NOTE — Telephone Encounter (Signed)
Patient called wanting to know if Dr Delton SeeNelson can order her chest xrays, patient would like to hear back from the nurse if this can be done. Please advise

## 2017-06-24 NOTE — Telephone Encounter (Signed)
Called [facility], states shes coughing up phlegm, given an appt for wed 1100am.

## 2017-06-25 DIAGNOSIS — H5022 Vertical strabismus, left eye: Secondary | ICD-10-CM | POA: Diagnosis not present

## 2017-06-25 DIAGNOSIS — H25813 Combined forms of age-related cataract, bilateral: Secondary | ICD-10-CM | POA: Diagnosis not present

## 2017-06-26 ENCOUNTER — Ambulatory Visit (INDEPENDENT_AMBULATORY_CARE_PROVIDER_SITE_OTHER): Payer: Medicare Other | Admitting: Family Medicine

## 2017-06-26 ENCOUNTER — Other Ambulatory Visit: Payer: Self-pay

## 2017-06-26 ENCOUNTER — Encounter: Payer: Self-pay | Admitting: Family Medicine

## 2017-06-26 VITALS — BP 136/68 | HR 84 | Temp 97.4°F | Resp 18 | Ht 68.0 in | Wt 208.1 lb

## 2017-06-26 DIAGNOSIS — R05 Cough: Secondary | ICD-10-CM

## 2017-06-26 DIAGNOSIS — F17200 Nicotine dependence, unspecified, uncomplicated: Secondary | ICD-10-CM | POA: Diagnosis not present

## 2017-06-26 DIAGNOSIS — R059 Cough, unspecified: Secondary | ICD-10-CM

## 2017-06-26 MED ORDER — BENZONATATE 100 MG PO CAPS: 100.0000 mg | ORAL_CAPSULE | Freq: Two times a day (BID) | ORAL | 0 refills | Status: DC | PRN

## 2017-06-26 MED ORDER — AMOXICILLIN 875 MG PO TABS: 875.0000 mg | ORAL_TABLET | Freq: Two times a day (BID) | ORAL | 0 refills | Status: DC

## 2017-06-26 NOTE — Patient Instructions (Signed)
Stop smoking Drink plenty of water Take the medicine for cough Call if not better in a few days

## 2017-06-26 NOTE — Progress Notes (Signed)
Chief Complaint  Patient presents with  . URI    x 3 weeks   Patient is here for cough and respiratory infection.  She has a "chest cold" for 3 weeks.  She feels like it is getting worse.  Her chest hurts when she takes a deep breath or cough, centrally.  She feels tired.  No sweats or chills.  She is coughing up green sputum.  No blood.  No runny nose or sore throat.  No sinus congestion or ear pain. She has been smoking more.  She this is due to stress.  She is encouraged to quit smoking. She is requesting a chest x-ray.  I explained to her that it was not medically necessary with a normal lung exam.  I inquired whether she is worried about lung cancer because of her smoking history.  In this case she might need a chest CT.  She is not, and I do not believe meets the criteria of 30 pack years of smoking.  Patient Active Problem List   Diagnosis Date Noted  . Vascular headache   . Seizure prophylaxis   . Weakness of left third cranial nerve   . Gait disturbance, post-stroke 11/22/2016  . Benign essential HTN   . Seizure disorder (HCC)   . Acute blood loss anemia   . Leukocytosis   . 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  . Bilateral leg pain 06/26/2016  . AAA (abdominal aortic aneurysm) without rupture (HCC) 06/26/2016  . Insomnia 06/07/2015  . Urinary incontinence 06/07/2015  . Chronic pain syndrome 03/04/2015  . Fibromyalgia 03/04/2015  . Neck pain 03/04/2015  . Cervical radiculitis 03/04/2015  . Lower back pain 03/04/2015  . Lumbar radicular pain 03/04/2015  . Major depression 07/05/2014  . Alcohol abuse 07/05/2014  . Liver cyst 03/18/2014  . Claudication, class I (HCC) 03/18/2013  . Smoker unmotivated to quit     Outpatient Encounter Medications as of 06/26/2017  Medication Sig  . acetaminophen (TYLENOL) 325 MG tablet Take 1-2 tablets (325-650 mg total) by mouth every 4 (four) hours as needed for mild pain.  Marland Kitchen. aspirin EC 81 MG tablet Take 81 mg by  mouth daily.  Marland Kitchen. docusate sodium (COLACE) 100 MG capsule Take 100 mg by mouth daily as needed for mild constipation.  Marland Kitchen. escitalopram (LEXAPRO) 20 MG tablet Take 1 tablet (20 mg total) by mouth daily.  Marland Kitchen. gabapentin (NEURONTIN) 400 MG capsule Take 2 capsules (800 mg total) by mouth 3 (three) times daily.  . hydrochlorothiazide (HYDRODIURIL) 25 MG tablet Take 1 tablet (25 mg total) by mouth daily.  . Ibuprofen-Diphenhydramine HCl (ADVIL PM) 200-25 MG CAPS Take 1 capsule by mouth at bedtime.  . lamoTRIgine (LAMICTAL) 100 MG tablet Take 1 tablet (100 mg total) by mouth 2 (two) times daily.  Marland Kitchen. levETIRAcetam (KEPPRA) 500 MG tablet Take 1 tablet (500 mg total) by mouth 2 (two) times daily.  Marland Kitchen. lisinopril (PRINIVIL,ZESTRIL) 30 MG tablet Take 1 tablet (30 mg total) by mouth daily.  . meclizine (ANTIVERT) 12.5 MG tablet Take 1 tablet (12.5 mg total) by mouth 3 (three) times daily as needed for dizziness.  . metoprolol tartrate (LOPRESSOR) 25 MG tablet Take 1 tablet (25 mg total) by mouth 2 (two) times daily.  . naproxen sodium (ANAPROX) 220 MG tablet Take 220 mg by mouth daily as needed (pain).  . potassium chloride SA (K-DUR,KLOR-CON) 20 MEQ tablet Take 1 tablet (20 mEq total) by mouth 2 (two) times daily.  . traZODone (  DESYREL) 150 MG tablet Take 1 tablet (150 mg total) by mouth at bedtime.  Marland Kitchen. amoxicillin (AMOXIL) 875 MG tablet Take 1 tablet (875 mg total) 2 (two) times daily by mouth.  . benzonatate (TESSALON) 100 MG capsule Take 1 capsule (100 mg total) 2 (two) times daily as needed by mouth for cough.   No facility-administered encounter medications on file as of 06/26/2017.     Allergies  Allergen Reactions  . Prozac [Fluoxetine Hcl] Other (See Comments)    Headaches    Review of Systems  Constitutional: Positive for fatigue. Negative for activity change, appetite change, chills, fever and unexpected weight change.  HENT: Negative for congestion, dental problem, postnasal drip and rhinorrhea.     Eyes: Negative for redness and visual disturbance.  Respiratory: Positive for cough. Negative for shortness of breath and wheezing.   Cardiovascular: Negative for chest pain, palpitations and leg swelling.  Gastrointestinal: Negative for abdominal pain, constipation and diarrhea.  Genitourinary: Negative for difficulty urinating and frequency.  Musculoskeletal: Negative for arthralgias and back pain.  Neurological: Negative for dizziness and headaches.  Psychiatric/Behavioral: Negative for dysphoric mood and sleep disturbance. The patient is not nervous/anxious.     BP 136/68 (BP Location: Left Arm, Patient Position: Sitting, Cuff Size: Large)   Pulse 84   Temp (!) 97.4 F (36.3 C) (Temporal)   Resp 18   Ht 5\' 8"  (1.727 m)   Wt 208 lb 1.9 oz (94.4 kg)   SpO2 97%   BMI 31.64 kg/m   Physical Exam  Constitutional: She appears well-developed and well-nourished.  Appears mildly ill, fatigued  HENT:  Head: Normocephalic and atraumatic.  Right Ear: External ear normal.  Left Ear: External ear normal.  Nose: Nose normal.  Mouth/Throat: Oropharynx is clear and moist.  Eyes: Conjunctivae are normal. Pupils are equal, round, and reactive to light.  Neck: Normal range of motion.  Cardiovascular: Normal rate, regular rhythm and normal heart sounds.  Pulmonary/Chest: Effort normal and breath sounds normal. She has no wheezes. She has no rales.  Lungs are clear throughout  Musculoskeletal: Normal range of motion. She exhibits no edema.  Lymphadenopathy:    She has no cervical adenopathy.  Neurological:  Mild face asymmetry  Psychiatric: She has a normal mood and affect. Her behavior is normal.    ASSESSMENT/PLAN:  1. Cough in adult Discussed that this likely started as a viral bronchitis.  Since it has been going on for 3 weeks, and feels like it is getting worse instead of better, I am going to give her a short course of antibiotics. I also discussed with her the importance of  smoking cessation.  She has already had a stroke, and smoking places her at increased risk of having another stroke.  It also increases her risk of respiratory infection.  She thanked me for my advice but did not act as if she was going to take it  Patient Instructions  Stop smoking Drink plenty of water Take the medicine for cough Call if not better in a few days   Eustace MooreYvonne Sue Deanne Bedgood, MD

## 2017-06-30 ENCOUNTER — Other Ambulatory Visit (HOSPITAL_COMMUNITY): Payer: Self-pay | Admitting: Psychiatry

## 2017-07-03 ENCOUNTER — Other Ambulatory Visit: Payer: Self-pay

## 2017-07-03 MED ORDER — LEVETIRACETAM 500 MG PO TABS: 500.0000 mg | ORAL_TABLET | Freq: Two times a day (BID) | ORAL | 1 refills | Status: DC

## 2017-07-03 NOTE — Telephone Encounter (Signed)
Seen 11 7 18 

## 2017-07-04 ENCOUNTER — Encounter (HOSPITAL_COMMUNITY): Payer: Self-pay | Admitting: Psychiatry

## 2017-07-04 ENCOUNTER — Ambulatory Visit (INDEPENDENT_AMBULATORY_CARE_PROVIDER_SITE_OTHER): Payer: Medicare Other | Admitting: Psychiatry

## 2017-07-04 VITALS — BP 130/88 | HR 65 | Ht 68.0 in | Wt 208.0 lb

## 2017-07-04 DIAGNOSIS — F1290 Cannabis use, unspecified, uncomplicated: Secondary | ICD-10-CM

## 2017-07-04 DIAGNOSIS — Z91411 Personal history of adult psychological abuse: Secondary | ICD-10-CM

## 2017-07-04 DIAGNOSIS — Z818 Family history of other mental and behavioral disorders: Secondary | ICD-10-CM

## 2017-07-04 DIAGNOSIS — Z634 Disappearance and death of family member: Secondary | ICD-10-CM

## 2017-07-04 DIAGNOSIS — Z91410 Personal history of adult physical and sexual abuse: Secondary | ICD-10-CM | POA: Diagnosis not present

## 2017-07-04 DIAGNOSIS — F332 Major depressive disorder, recurrent severe without psychotic features: Secondary | ICD-10-CM | POA: Diagnosis not present

## 2017-07-04 DIAGNOSIS — F17210 Nicotine dependence, cigarettes, uncomplicated: Secondary | ICD-10-CM

## 2017-07-04 DIAGNOSIS — Z811 Family history of alcohol abuse and dependence: Secondary | ICD-10-CM

## 2017-07-04 MED ORDER — TRAZODONE HCL 150 MG PO TABS: 150.0000 mg | ORAL_TABLET | Freq: Every day | ORAL | 2 refills | Status: DC

## 2017-07-04 MED ORDER — ESCITALOPRAM OXALATE 20 MG PO TABS: 20.0000 mg | ORAL_TABLET | Freq: Two times a day (BID) | ORAL | 1 refills | Status: DC

## 2017-07-04 MED ORDER — LAMOTRIGINE 100 MG PO TABS: 100.0000 mg | ORAL_TABLET | Freq: Two times a day (BID) | ORAL | 1 refills | Status: DC

## 2017-07-04 MED ORDER — RISPERIDONE 0.5 MG PO TABS: 0.5000 mg | ORAL_TABLET | Freq: Every day | ORAL | 2 refills | Status: DC
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
## 2017-07-10 DIAGNOSIS — H532 Diplopia: Secondary | ICD-10-CM | POA: Diagnosis not present

## 2017-07-10 DIAGNOSIS — I671 Cerebral aneurysm, nonruptured: Secondary | ICD-10-CM | POA: Diagnosis not present

## 2017-07-23 ENCOUNTER — Encounter: Payer: Self-pay | Admitting: Family Medicine

## 2017-09-03 ENCOUNTER — Encounter (HOSPITAL_COMMUNITY): Payer: Self-pay | Admitting: Psychiatry

## 2017-09-03 ENCOUNTER — Telehealth: Payer: Self-pay | Admitting: Family Medicine

## 2017-09-03 ENCOUNTER — Ambulatory Visit (INDEPENDENT_AMBULATORY_CARE_PROVIDER_SITE_OTHER): Payer: Medicare HMO | Admitting: Psychiatry

## 2017-09-03 VITALS — BP 115/79 | HR 72 | Ht 68.0 in | Wt 202.0 lb

## 2017-09-03 DIAGNOSIS — F332 Major depressive disorder, recurrent severe without psychotic features: Secondary | ICD-10-CM | POA: Diagnosis not present

## 2017-09-03 DIAGNOSIS — Z62810 Personal history of physical and sexual abuse in childhood: Secondary | ICD-10-CM

## 2017-09-03 DIAGNOSIS — Z811 Family history of alcohol abuse and dependence: Secondary | ICD-10-CM

## 2017-09-03 DIAGNOSIS — F17210 Nicotine dependence, cigarettes, uncomplicated: Secondary | ICD-10-CM

## 2017-09-03 DIAGNOSIS — Z818 Family history of other mental and behavioral disorders: Secondary | ICD-10-CM | POA: Diagnosis not present

## 2017-09-03 DIAGNOSIS — Z91411 Personal history of adult psychological abuse: Secondary | ICD-10-CM

## 2017-09-03 DIAGNOSIS — Z634 Disappearance and death of family member: Secondary | ICD-10-CM

## 2017-09-03 DIAGNOSIS — F1210 Cannabis abuse, uncomplicated: Secondary | ICD-10-CM

## 2017-09-03 DIAGNOSIS — R69 Illness, unspecified: Secondary | ICD-10-CM | POA: Diagnosis not present

## 2017-09-03 DIAGNOSIS — G4700 Insomnia, unspecified: Secondary | ICD-10-CM | POA: Diagnosis not present

## 2017-09-03 DIAGNOSIS — Z6379 Other stressful life events affecting family and household: Secondary | ICD-10-CM

## 2017-09-03 DIAGNOSIS — Z91410 Personal history of adult physical and sexual abuse: Secondary | ICD-10-CM | POA: Diagnosis not present

## 2017-09-03 MED ORDER — LAMOTRIGINE 100 MG PO TABS: 100.0000 mg | ORAL_TABLET | Freq: Two times a day (BID) | ORAL | 1 refills | Status: DC

## 2017-09-03 MED ORDER — RISPERIDONE 0.5 MG PO TABS: 0.5000 mg | ORAL_TABLET | Freq: Every day | ORAL | 2 refills | Status: DC

## 2017-09-03 MED ORDER — TRAZODONE HCL 50 MG PO TABS: 50.0000 mg | ORAL_TABLET | Freq: Every day | ORAL | 2 refills | Status: DC

## 2017-09-03 MED ORDER — ESCITALOPRAM OXALATE 20 MG PO TABS: 20.0000 mg | ORAL_TABLET | Freq: Two times a day (BID) | ORAL | 1 refills | Status: DC

## 2017-09-03 NOTE — Telephone Encounter (Signed)
Pt stopped by to advised that she remembered going to the Dr about her Knees, and they said it was Bone on bone. Just wanted to let you know
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
## 2017-09-12 ENCOUNTER — Telehealth (HOSPITAL_COMMUNITY): Payer: Self-pay | Admitting: *Deleted

## 2017-09-12 NOTE — Telephone Encounter (Signed)
Letter [place] from [employer] stating that [employer] will need a quantity limit. [facility] who states it will be approved until 08/09/18. Letter will be faxed to office.

## 2017-09-24 ENCOUNTER — Other Ambulatory Visit: Payer: Self-pay

## 2017-09-24 ENCOUNTER — Encounter: Payer: Self-pay | Admitting: Family Medicine

## 2017-09-24 ENCOUNTER — Ambulatory Visit (INDEPENDENT_AMBULATORY_CARE_PROVIDER_SITE_OTHER): Payer: Medicare HMO | Admitting: Family Medicine

## 2017-09-24 VITALS — BP 128/78 | HR 76 | Temp 97.9°F | Resp 20 | Ht 68.0 in | Wt 205.1 lb

## 2017-09-24 DIAGNOSIS — M25561 Pain in right knee: Secondary | ICD-10-CM

## 2017-09-24 DIAGNOSIS — M25562 Pain in left knee: Secondary | ICD-10-CM

## 2017-09-24 MED ORDER — MELOXICAM 15 MG PO TABS: 15.0000 mg | ORAL_TABLET | Freq: Every day | ORAL | 0 refills | Status: DC

## 2017-09-24 NOTE — Patient Instructions (Signed)
Take meloxicam once a day May take tylenol if needed Do not take advil or aleve on the meloxicam I have ordered x rays of knees I have referred to [facility] Orthopedic surgeon Handicap parking authorized

## 2017-09-24 NOTE — Progress Notes (Signed)
Chief Complaint  Patient presents with  . Knee Pain    bilateral   Patient is here for knee pain.  She complains of many years of bilateral knee pain.  She states she previously has been to an orthopedist.  She has had x-rays.  She has had medicine.  She has had injections.  None of these have given her lasting relief.  Her knees are becoming progressively more painful.  Now she is having trouble walking for any distance.  She requests a handicap parking sticker.  She requests pain medication.  She states her prior primary care doctor gave her pain medicine for her knees.  I explained to her that the appropriate conservative management of arthritic knee pain is anti-inflammatory medication, exercise, weight reduction, I will refer her to an orthopedic.  Patient Active Problem List   Diagnosis Date Noted  . Vascular headache   . Seizure prophylaxis   . Weakness of left third cranial nerve   . Gait disturbance, post-stroke 11/22/2016  . Benign essential HTN   . Seizure disorder (HCC)   . Acute blood loss anemia   . Leukocytosis   . 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  . Bilateral leg pain 06/26/2016  . AAA (abdominal aortic aneurysm) without rupture (HCC) 06/26/2016  . Insomnia 06/07/2015  . Urinary incontinence 06/07/2015  . Chronic pain syndrome 03/04/2015  . Fibromyalgia 03/04/2015  . Neck pain 03/04/2015  . Cervical radiculitis 03/04/2015  . Lower back pain 03/04/2015  . Lumbar radicular pain 03/04/2015  . Major depression 07/05/2014  . Alcohol abuse 07/05/2014  . Liver cyst 03/18/2014  . Claudication, class I (HCC) 03/18/2013  . Smoker unmotivated to quit     Outpatient Encounter Medications as of 09/24/2017  Medication Sig  . acetaminophen (TYLENOL) 325 MG tablet Take 1-2 tablets (325-650 mg total) by mouth every 4 (four) hours as needed for mild pain.  Marland Kitchen. amoxicillin (AMOXIL) 875 MG tablet Take 1 tablet (875 mg total) 2 (two) times daily by mouth.  Marland Kitchen.  aspirin EC 81 MG tablet Take 81 mg by mouth daily.  . benzonatate (TESSALON) 100 MG capsule Take 1 capsule (100 mg total) 2 (two) times daily as needed by mouth for cough.  . docusate sodium (COLACE) 100 MG capsule Take 100 mg by mouth daily as needed for mild constipation.  Marland Kitchen. escitalopram (LEXAPRO) 20 MG tablet Take 1 tablet (20 mg total) by mouth 2 (two) times daily.  Marland Kitchen. gabapentin (NEURONTIN) 400 MG capsule Take 2 capsules (800 mg total) by mouth 3 (three) times daily.  . hydrochlorothiazide (HYDRODIURIL) 25 MG tablet Take 1 tablet (25 mg total) by mouth daily.  . Ibuprofen-Diphenhydramine HCl (ADVIL PM) 200-25 MG CAPS Take 1 capsule by mouth at bedtime.  . lamoTRIgine (LAMICTAL) 100 MG tablet Take 1 tablet (100 mg total) by mouth 2 (two) times daily.  Marland Kitchen. levETIRAcetam (KEPPRA) 500 MG tablet Take 1 tablet (500 mg total) 2 (two) times daily by mouth.  Marland Kitchen. lisinopril (PRINIVIL,ZESTRIL) 30 MG tablet Take 1 tablet (30 mg total) by mouth daily.  . meclizine (ANTIVERT) 12.5 MG tablet Take 1 tablet (12.5 mg total) by mouth 3 (three) times daily as needed for dizziness.  . metoprolol tartrate (LOPRESSOR) 25 MG tablet Take 1 tablet (25 mg total) by mouth 2 (two) times daily.  . naproxen sodium (ANAPROX) 220 MG tablet Take 220 mg by mouth daily as needed (pain).  . potassium chloride SA (K-DUR,KLOR-CON) 20 MEQ tablet Take 1 tablet (20  mEq total) by mouth 2 (two) times daily.  . risperiDONE (RISPERDAL) 0.5 MG tablet Take 1 tablet (0.5 mg total) by mouth at bedtime.  . traZODone (DESYREL) 50 MG tablet Take 1 tablet (50 mg total) by mouth at bedtime.  . meloxicam (MOBIC) 15 MG tablet Take 1 tablet (15 mg total) by mouth daily.   No facility-administered encounter medications on file as of 09/24/2017.     Allergies  Allergen Reactions  . Prozac [Fluoxetine Hcl] Other (See Comments)    Headaches    Review of Systems  Constitutional: Negative for activity change, appetite change and unexpected weight  change.  HENT: Negative for congestion, dental problem, postnasal drip and rhinorrhea.   Eyes: Negative for redness and visual disturbance.  Respiratory: Negative for cough and shortness of breath.   Cardiovascular: Negative for chest pain, palpitations and leg swelling.  Gastrointestinal: Negative for abdominal pain, constipation and diarrhea.  Genitourinary: Negative for difficulty urinating and frequency.  Musculoskeletal: Positive for arthralgias, gait problem and joint swelling. Negative for back pain.  Neurological: Negative for dizziness and headaches.  Psychiatric/Behavioral: Negative for dysphoric mood and sleep disturbance. The patient is not nervous/anxious.        Depression and anxiety are stable    BP 128/78 (BP Location: Left Arm, Patient Position: Sitting, Cuff Size: Normal)   Pulse 76   Temp 97.9 F (36.6 C) (Temporal)   Resp 20   Ht 5\' 8"  (1.727 m)   Wt 205 lb 1.9 oz (93 kg)   SpO2 98%   BMI 31.19 kg/m   Physical Exam  Constitutional: She is oriented to person, place, and time. She appears well-developed and well-nourished. No distress.  HENT:  Head: Normocephalic and atraumatic.  Mouth/Throat: Oropharynx is clear and moist.  Cardiovascular: Normal rate, regular rhythm and normal heart sounds.  Pulmonary/Chest: Effort normal and breath sounds normal.  Musculoskeletal: She exhibits no edema or deformity.  Mildly antalgic gait.  Both knees are warm.  Mild patellofemoral crepitus.  Pain with patellofemoral grind testing.  Mild joint line tenderness on the right medial knee.  No instability.  Full range of motion.  Neurological: She is alert and oriented to person, place, and time.  Psychiatric: She has a normal mood and affect. Her behavior is normal. Thought content normal.    ASSESSMENT/PLAN:  1. Bilateral anterior knee pain  - DG Knee Complete 4 Views Right; Future - DG Knee Complete 4 Views Left; Future - Ambulatory referral to Orthopedic  Surgery   Patient Instructions  Take meloxicam once a day May take tylenol if needed Do not take advil or aleve on the meloxicam I have ordered x rays of knees I have referred to [facility] Orthopedic surgeon Handicap parking authorized   Eustace Moore, MD

## 2017-10-08 ENCOUNTER — Other Ambulatory Visit: Payer: Self-pay

## 2017-10-09 ENCOUNTER — Telehealth: Payer: Self-pay | Admitting: Family Medicine

## 2017-10-09 NOTE — Telephone Encounter (Signed)
If you checked the chart you can see this was done on 09/24/17

## 2017-10-09 NOTE — Telephone Encounter (Signed)
Pt aware.

## 2017-10-09 NOTE — Telephone Encounter (Signed)
Pt is calling wanting a referral to the Orthopaedic provider regarding her knees

## 2017-10-28 ENCOUNTER — Encounter: Payer: Self-pay | Admitting: Family Medicine

## 2017-11-14 ENCOUNTER — Encounter: Payer: Self-pay | Admitting: Orthopedic Surgery

## 2017-11-14 ENCOUNTER — Ambulatory Visit (INDEPENDENT_AMBULATORY_CARE_PROVIDER_SITE_OTHER): Payer: Medicare HMO

## 2017-11-14 ENCOUNTER — Ambulatory Visit (INDEPENDENT_AMBULATORY_CARE_PROVIDER_SITE_OTHER): Payer: Medicare HMO | Admitting: Orthopedic Surgery

## 2017-11-14 ENCOUNTER — Other Ambulatory Visit: Payer: Self-pay

## 2017-11-14 VITALS — BP 129/85 | HR 76 | Ht 68.0 in | Wt 200.0 lb

## 2017-11-14 DIAGNOSIS — M170 Bilateral primary osteoarthritis of knee: Secondary | ICD-10-CM

## 2017-11-14 DIAGNOSIS — M25561 Pain in right knee: Secondary | ICD-10-CM

## 2017-11-14 DIAGNOSIS — G8929 Other chronic pain: Secondary | ICD-10-CM

## 2017-11-14 DIAGNOSIS — M25562 Pain in left knee: Secondary | ICD-10-CM

## 2017-11-14 NOTE — Progress Notes (Signed)
NEW PATIENT OFFICE VISIT   Chief Complaint  Patient presents with  . Knee Pain    bilateral knees painful for 60 years     72 year old female presents with history of 20 years of knee pain getting worse.  She has been treated with naproxen and knee injections but complains of dull aching bilateral knee pain times 20 years getting worse in the last few years associated with difficulty walking.  She has a right-handed cane that she uses   Review of Systems  Constitutional: Negative for fever and weight loss.  Musculoskeletal: Positive for back pain and joint pain.  Neurological: Negative for tingling, sensory change and focal weakness.     Past Medical History:  Diagnosis Date  . Acute blood loss anemia   . Allergy   . Anxiety   . Anxiety and depression   . Arthritis   . Cognitive deficit due to recent stroke 11/22/2016  . Depression   . Dyslipidemia   . Fibromyalgia   . Hepatic cyst   . Hyperlipidemia   . Hypertension   . MI (myocardial infarction) ([place]) 2007   By patient report, not substantiated by recent nuclear stress test.  . Neuromuscular disorder ([place])   . Seizure disorder ([place])    none since 2002  . Seizures ([place])   . Smoker unmotivated to quit    Quit for 3 years and then restarted  . Substance abuse ([place])    ETOH  . Vision abnormalities     Past Surgical History:  Procedure Laterality Date  . CRANIOTOMY Left 11/19/2016   Procedure: CRANIOTOMY INTRACRANIAL FOR  ANEURYSM CLIPPING;  Surgeon: Kevan Ny Ditty, MD;  Location: [facility];  Service: Neurosurgery;  Laterality: Left;  . ELBOW ARTHROPLASTY Left    rods  . Exercise tolerance test  04/27/2013   CPET-MET: Submaximal effort (0.96, with goal of greater than 1.09) -- patient states that her effort was limited due to knee pain.  This makes the rest of the interpretation difficult: Peak VO2 - 10.5 (59% moderate), peak 02-pulse -  7..11 (low); heart rate 114 (73% -chronic incompetence considered);; PFTs normal    . MOUTH SURGERY    . NM MYOVIEW LTD  05/19/2013   LexiScan: EF 80%, no evidence of ischemia or infarction  . TRANSTHORACIC ECHOCARDIOGRAM  03/25/2013   EF 55-60%, no regional wall motion abnormalities; normal diastolic parameters, normal pulmonary pressures.  No valvular lesions noted.  Essentially normal echo     Family History  Problem Relation Age of Onset  . Heart attack Father 39  . Hypertension Father   . Alcohol abuse Father   . Heart attack Maternal Grandmother   . Cancer Maternal Grandfather        type unknown  . Cancer Paternal Grandmother        type unknown  . Depression Mother   . Alcohol abuse Mother   . Ovarian cancer Paternal Aunt   . Cancer Paternal Aunt        ovarian  . Prostate cancer Paternal Uncle   . Stomach cancer Paternal Aunt   . Diabetes Daughter   . Heart disease Daughter 50       heart attack  . Depression Sister   . Alcohol abuse Brother   . Depression Sister   . Alcohol abuse Sister   . Depression Sister   . Alcohol abuse Sister   . Alcohol abuse Brother   . Alcohol abuse Son    Social History  Tobacco Use  . Smoking status: Current Every Day Smoker    Packs/day: 0.50    Types: Cigarettes  . Smokeless tobacco: Never Used  . Tobacco comment: one pack every 3 months  Substance Use Topics  . Alcohol use: No    Comment: occasionally  . Drug use: Yes    Frequency: 2.0 times per week    Types: Marijuana    Comment: occasionally   Current Meds  Medication Sig  . acetaminophen (TYLENOL) 325 MG tablet Take 1-2 tablets (325-650 mg total) by mouth every 4 (four) hours as needed for mild pain.  Marland Kitchen aspirin EC 81 MG tablet Take 81 mg by mouth daily.  Marland Kitchen escitalopram (LEXAPRO) 20 MG tablet Take 1 tablet (20 mg total) by mouth 2 (two) times daily.  Marland Kitchen gabapentin (NEURONTIN) 800 MG tablet 800 mg 3 (three) times daily.   . hydrochlorothiazide (HYDRODIURIL) 25 MG tablet Take 1 tablet (25 mg total) by mouth daily.  . Ibuprofen-Diphenhydramine HCl  (ADVIL PM) 200-25 MG CAPS Take 1 capsule by mouth at bedtime.  . lamoTRIgine (LAMICTAL) 100 MG tablet Take 1 tablet (100 mg total) by mouth 2 (two) times daily.  Marland Kitchen levETIRAcetam (KEPPRA) 500 MG tablet Take 1 tablet (500 mg total) 2 (two) times daily by mouth.  Marland Kitchen lisinopril (PRINIVIL,ZESTRIL) 30 MG tablet Take 1 tablet (30 mg total) by mouth daily.  . meclizine (ANTIVERT) 12.5 MG tablet Take 1 tablet (12.5 mg total) by mouth 3 (three) times daily as needed for dizziness.  . meloxicam (MOBIC) 15 MG tablet Take 1 tablet (15 mg total) by mouth daily.  . metoprolol tartrate (LOPRESSOR) 25 MG tablet Take 1 tablet (25 mg total) by mouth 2 (two) times daily.  . naproxen sodium (ANAPROX) 220 MG tablet Take 220 mg by mouth daily as needed (pain).  . potassium chloride SA (K-DUR,KLOR-CON) 20 MEQ tablet Take 1 tablet (20 mEq total) by mouth 2 (two) times daily.  . risperiDONE (RISPERDAL) 0.5 MG tablet Take 1 tablet (0.5 mg total) by mouth at bedtime.  . traZODone (DESYREL) 50 MG tablet Take 1 tablet (50 mg total) by mouth at bedtime.    BP 129/85   Pulse 76   Ht _0  (1.727 m)   Wt 200 lb (90.7 kg)   BMI 30.41 kg/m   Physical Exam  Constitutional: She is oriented to person, place, and time. She appears well-developed and well-nourished.  Neurological: She is alert and oriented to person, place, and time.  Psychiatric: She has a normal mood and affect. Judgment normal.  Vitals reviewed.   Ortho Exam  Generalized valgus knee alignment to both knees.  No gross effusion but synovium feels thickened in each knee.  She has full extension in both knees flexion of 125 degrees  Both knees have normal strength on extension no instability skin is normal  Distally there is no peripheral edema in both legs have normal sensation  Again her gait is supported by a cane in the right hand.  There is no significant thrust on either side of both knees are in valgus   MEDICAL DECISION SECTION  xrays ordered?  yes  My independent reading of xrays: xrays of both knees    Encounter Diagnosis  Name Primary?  . Bilateral chronic knee pain Yes     PLAN:  Patient is referred to chronic pain management  X-ray does not show significant amount of arthritis that would cause her to have difficulty walking or the severe pain that she  is complaining of.  She  does not want to have repeat injections she is already on naproxen

## 2017-12-02 ENCOUNTER — Ambulatory Visit (HOSPITAL_COMMUNITY): Payer: Medicare Other | Admitting: Psychiatry

## 2017-12-09 ENCOUNTER — Encounter (HOSPITAL_COMMUNITY): Payer: Self-pay | Admitting: Psychiatry

## 2017-12-09 ENCOUNTER — Ambulatory Visit (INDEPENDENT_AMBULATORY_CARE_PROVIDER_SITE_OTHER): Payer: Medicare HMO | Admitting: Psychiatry

## 2017-12-09 VITALS — BP 121/85 | HR 68 | Ht 68.0 in | Wt 203.0 lb

## 2017-12-09 DIAGNOSIS — F1290 Cannabis use, unspecified, uncomplicated: Secondary | ICD-10-CM

## 2017-12-09 DIAGNOSIS — F332 Major depressive disorder, recurrent severe without psychotic features: Secondary | ICD-10-CM | POA: Diagnosis not present

## 2017-12-09 DIAGNOSIS — M2550 Pain in unspecified joint: Secondary | ICD-10-CM

## 2017-12-09 DIAGNOSIS — F17210 Nicotine dependence, cigarettes, uncomplicated: Secondary | ICD-10-CM | POA: Diagnosis not present

## 2017-12-09 DIAGNOSIS — Z811 Family history of alcohol abuse and dependence: Secondary | ICD-10-CM | POA: Diagnosis not present

## 2017-12-09 DIAGNOSIS — Z818 Family history of other mental and behavioral disorders: Secondary | ICD-10-CM | POA: Diagnosis not present

## 2017-12-09 DIAGNOSIS — R69 Illness, unspecified: Secondary | ICD-10-CM | POA: Diagnosis not present

## 2017-12-09 MED ORDER — RISPERIDONE 0.5 MG PO TABS: 0.5000 mg | ORAL_TABLET | Freq: Every day | ORAL | 2 refills | Status: DC

## 2017-12-09 MED ORDER — ESCITALOPRAM OXALATE 20 MG PO TABS: 20.0000 mg | ORAL_TABLET | Freq: Two times a day (BID) | ORAL | 2 refills | Status: DC

## 2017-12-09 MED ORDER — TRAZODONE HCL 50 MG PO TABS: 50.0000 mg | ORAL_TABLET | Freq: Every day | ORAL | 2 refills | Status: DC

## 2017-12-09 MED ORDER — LAMOTRIGINE 100 MG PO TABS: 100.0000 mg | ORAL_TABLET | Freq: Two times a day (BID) | ORAL | 1 refills | Status: DC
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
## 2017-12-10 ENCOUNTER — Encounter: Payer: Self-pay | Admitting: Family Medicine

## 2017-12-10 ENCOUNTER — Ambulatory Visit (INDEPENDENT_AMBULATORY_CARE_PROVIDER_SITE_OTHER): Payer: Medicare HMO | Admitting: Family Medicine

## 2017-12-10 ENCOUNTER — Other Ambulatory Visit: Payer: Self-pay

## 2017-12-10 VITALS — BP 114/80 | HR 60 | Temp 98.9°F | Resp 12 | Ht 68.5 in | Wt 203.1 lb

## 2017-12-10 DIAGNOSIS — R9431 Abnormal electrocardiogram [ECG] [EKG]: Secondary | ICD-10-CM | POA: Diagnosis not present

## 2017-12-10 DIAGNOSIS — Z1231 Encounter for screening mammogram for malignant neoplasm of breast: Secondary | ICD-10-CM

## 2017-12-10 DIAGNOSIS — F17200 Nicotine dependence, unspecified, uncomplicated: Secondary | ICD-10-CM | POA: Diagnosis not present

## 2017-12-10 DIAGNOSIS — I10 Essential (primary) hypertension: Secondary | ICD-10-CM

## 2017-12-10 DIAGNOSIS — R2689 Other abnormalities of gait and mobility: Secondary | ICD-10-CM

## 2017-12-10 DIAGNOSIS — Z780 Asymptomatic menopausal state: Secondary | ICD-10-CM | POA: Diagnosis not present

## 2017-12-10 DIAGNOSIS — Z1239 Encounter for other screening for malignant neoplasm of breast: Secondary | ICD-10-CM

## 2017-12-10 DIAGNOSIS — N3281 Overactive bladder: Secondary | ICD-10-CM

## 2017-12-10 DIAGNOSIS — R319 Hematuria, unspecified: Secondary | ICD-10-CM

## 2017-12-10 DIAGNOSIS — R69 Illness, unspecified: Secondary | ICD-10-CM | POA: Diagnosis not present

## 2017-12-10 DIAGNOSIS — Z0000 Encounter for general adult medical examination without abnormal findings: Secondary | ICD-10-CM | POA: Diagnosis not present

## 2017-12-10 LAB — POCT URINALYSIS DIPSTICK
Bilirubin, UA: NEGATIVE
Glucose, UA: NEGATIVE
KETONES UA: NEGATIVE
NITRITE UA: NEGATIVE
PH UA: 6 (ref 5.0–8.0)
PROTEIN UA: NEGATIVE
SPEC GRAV UA: 1.025 (ref 1.010–1.025)
UROBILINOGEN UA: 1 U/dL

## 2017-12-10 MED ORDER — OXYBUTYNIN CHLORIDE ER 10 MG PO TB24: 10.0000 mg | ORAL_TABLET | Freq: Every day | ORAL | 0 refills | Status: DC

## 2017-12-10 MED ORDER — OLMESARTAN MEDOXOMIL 20 MG PO TABS: 20.0000 mg | ORAL_TABLET | Freq: Every day | ORAL | 0 refills | Status: DC

## 2017-12-10 NOTE — Progress Notes (Signed)
Chief Complaint  Patient presents with  . Annual Exam   Patient is here for an annual visit.  This was scheduled 6 months ago.  I have since resigned from the practice and is my last day of work.  I am going to order tests for her but emphasized to her that I will not be here to follow-up with these test.  She needs to get a new primary care doctor as soon as possible in case there are abnormalities identified. She comes in today with a "list". From a health maintenance standpoint she is due for mammogram and DEXA scan. She is due for her second pneumonia shot today and agrees to get this. I am not getting laboratory work because I am unable to check the results. She is obese.  She does not diet.  She does not exercise.  She feels unable. On her list she has urinary frequency.  Incontinence.  This is worse over time.  No hematuria dysuria or flank pain.  No history of kidney stones.  She is a smoker.  Her symptoms are consistent with overactive bladder, urge incontinence.  Urinalysis today reveals microhematuria.  I emphasized to her that this needs to be followed up On her list his gait imbalance and falls.  This is ever since her craniotomy.  I emphasized her she does go back to the neurologist for follow-up. She complains of chest pain occasionally.  It is random and sharp.  Not associate with exertion.  She does want her heart checked.  An EKG today reveals an abnormality and she is referred to cardiology. I have discussed the multiple health risks associated with cigarette smoking including, but not limited to, cardiovascular disease, lung disease and cancer.  I have strongly recommended that smoking be stopped.  I have reviewed the various methods of quitting including cold [place], classes, nicotine replacements and prescription medications.  I have offered assistance in this difficult process.  The patient is not interested in assistance at this time. She is under the care of psychiatry  for depression and anxiety.  She is on medication.  She scores very high in her PHQ today, 19.  She is advised to go back to her psychiatrist at her earliest availability.   Patient Active Problem List   Diagnosis Date Noted  . Vascular headache   . Seizure prophylaxis   . Weakness of left third cranial nerve   . Gait disturbance, post-stroke 11/22/2016  . Benign essential HTN   . Seizure disorder (HCC)   . Acute blood loss anemia   . 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  . Bilateral leg pain 06/26/2016  . AAA (abdominal aortic aneurysm) without rupture (HCC) 06/26/2016  . Insomnia 06/07/2015  . Urinary incontinence 06/07/2015  . Chronic pain syndrome 03/04/2015  . Fibromyalgia 03/04/2015  . Cervical radiculitis 03/04/2015  . Lumbar radicular pain 03/04/2015  . Major depression 07/05/2014  . Claudication, class I (HCC) 03/18/2013  . Smoker unmotivated to quit     Outpatient Encounter Medications as of 12/10/2017  Medication Sig  . acetaminophen (TYLENOL) 325 MG tablet Take 1-2 tablets (325-650 mg total) by mouth every 4 (four) hours as needed for mild pain.  Marland Kitchen. aspirin EC 81 MG tablet Take 81 mg by mouth daily.  Marland Kitchen. escitalopram (LEXAPRO) 20 MG tablet Take 1 tablet (20 mg total) by mouth 2 (two) times daily.  Marland Kitchen. gabapentin (NEURONTIN) 800 MG tablet 800 mg 3 (three) times daily.   . Ibuprofen-Diphenhydramine  HCl (ADVIL PM) 200-25 MG CAPS Take 1 capsule by mouth at bedtime.  . lamoTRIgine (LAMICTAL) 100 MG tablet Take 1 tablet (100 mg total) by mouth 2 (two) times daily.  Marland Kitchen levETIRAcetam (KEPPRA) 500 MG tablet Take 1 tablet (500 mg total) 2 (two) times daily by mouth.  . metoprolol tartrate (LOPRESSOR) 25 MG tablet Take 1 tablet (25 mg total) by mouth 2 (two) times daily.  . naproxen sodium (ANAPROX) 220 MG tablet Take 220 mg by mouth daily as needed (pain).  . potassium chloride SA (K-DUR,KLOR-CON) 20 MEQ tablet Take 1 tablet (20 mEq total) by mouth 2 (two) times  daily.  . risperiDONE (RISPERDAL) 0.5 MG tablet Take 1 tablet (0.5 mg total) by mouth at bedtime.  . traZODone (DESYREL) 50 MG tablet Take 1 tablet (50 mg total) by mouth at bedtime.  . [DISCONTINUED] lisinopril (PRINIVIL,ZESTRIL) 30 MG tablet Take 1 tablet (30 mg total) by mouth daily.  Marland Kitchen olmesartan (BENICAR) 20 MG tablet Take 1 tablet (20 mg total) by mouth daily.  Marland Kitchen oxybutynin (DITROPAN-XL) 10 MG 24 hr tablet Take 1 tablet (10 mg total) by mouth at bedtime.   No facility-administered encounter medications on file as of 12/10/2017.     Allergies  Allergen Reactions  . Prozac [Fluoxetine Hcl] Other (See Comments)    Headaches    Review of Systems  Constitutional: Positive for fatigue and unexpected weight change.  HENT: Positive for dental problem and trouble swallowing. Negative for congestion.        Trouble chewing and swallowing from bad teeth  Eyes: Positive for photophobia, redness, itching and visual disturbance.       Allergies, visual impairment from stroke  Respiratory: Positive for cough and shortness of breath.   Cardiovascular: Positive for chest pain. Negative for palpitations and leg swelling.  Gastrointestinal: Negative for blood in stool, constipation and diarrhea.  Genitourinary: Positive for frequency. Negative for difficulty urinating, dysuria, flank pain, hematuria and vaginal bleeding.  Musculoskeletal: Positive for arthralgias and back pain.       Chronic  Skin: Negative for color change and rash.  Neurological: Positive for facial asymmetry, light-headedness and headaches.  Psychiatric/Behavioral: Positive for dysphoric mood and sleep disturbance. Negative for self-injury and suicidal ideas. The patient is nervous/anxious.     Physical Exam   BP 114/80   Pulse 60   Temp 98.9 F (37.2 C) (Oral)   Resp 12   Ht 5' 8.5" (1.74 m)   Wt 203 lb 1.3 oz (92.1 kg)   SpO2 92%   BMI 30.43 kg/m   General Appearance:    Alert, cooperative, no distress, appears  stated age.  Moderately overweight, truncal obesity  Head:    Normocephalic, without obvious abnormality, atraumatic  Eyes:    PERRL, conjunctiva/corneas clear, EOM's intact,both eyes eyelids are puffy  Ears:    Normal TM's and external ear canals, both ears  Nose:   Nares normal, septum midline, mucosa normal, no drainage    or sinus tenderness.   Throat:   Lips, mucosa, and tongue normal; teeth need repair many absent and gums normal  Neck:   Supple, symmetrical, trachea midline, no adenopathy;    thyroid:  no enlargement/tenderness/nodules; no carotid   bruit   Back:     Symmetric, no curvature, ROM normal, no CVA tenderness  Lungs:    Mostly clear to auscultation bilaterally, central rhonchi, respirations unlabored  Chest Wall:    No tenderness or deformity   Heart:    Regular  rate and rhythm, S1 and S2 normal, no murmur, rub   or gallop  Breast Exam:    No tenderness, masses, or nipple abnormality right axilla clear  Abdomen:     Soft, non-tender, bowel sounds active all four quadrants,    no masses, no organomegaly  Extremities:   Extremities normal, atraumatic, no cyanosis or edema  Pulses:  2+ at wrist trace to 1 + feet and symmetric all extremities  Skin:   Skin color, texture, turgor normal, no rashes or lesions  Lymph nodes:   Cervical, supraclavicular, and axillary nodes normal  Neurologic:   Normal strength, sensation and reflexes    throughout     BP 114/80   Pulse 60   Temp 98.9 F (37.2 C) (Oral)   Resp 12   Ht 5' 8.5" (1.74 m)   Wt 203 lb 1.3 oz (92.1 kg)   SpO2 92%   BMI 30.43 kg/m     ASSESSMENT/PLAN:  1. PE (physical exam), annual No unexpected findings  2. Smoker unmotivated to quit As above discussed in length with patient  3. Essential hypertension EKG has conduction delay and partial block.  Needs cardiology consultation no acute strain. - EKG 12-Lead  4. Abnormal EKG As above - EKG 12-Lead - Ambulatory referral to Cardiology  5.  Screening for breast cancer Mammogram ordered.  Quite overdue - MM Digital Screening; Future  6. Post-menopausal DEXA scan ordered.  Never done - DG Bone Density; Future  7. Urgency-frequency syndrome Dipstick shows hematuria, but I believe she has urge incontinence - POCT Urinalysis Dipstick  8. Balance problem By report.  Neurologic exam is pretty symmetric.  Gait is good. - Ambulatory referral to Neurology  9. Hematuria, unspecified type Unexpected finding on urinalysis.  Will have her follow-up with her new physician  The patient is here for an annual physical.  She had multiple complaints and needed referrals to to specialist, test ordered, and new prescriptions.  I charged her for an office visit as well as an annual examination.  Time with patient was 25 minutes in addition to the physical examination to manage her medical problems.   Patient Instructions  I will make sure you have 3 months of medicine to last until you can see a new PCP  I have ordered a referral to cardiology for the heart evaluation  I  Have replaced lisinopril with benicar due to the cough  I have ordered oxybutynin at night for the urine frequency Follow up with your new doctor to check on the blood in your urine  I have ordered a mammogram and a dexa scan You must find a new PCP because I will order the medical care you need but not be able to follow up on the results  Go back to the neurologist for the balance issues.  I will place referral  Try generic allergy med like cetirizine   Eustace Moore, MD

## 2017-12-10 NOTE — Patient Instructions (Addendum)
I will make sure you have 3 months of medicine to last until you can see a new PCP  I have ordered a referral to cardiology for the heart evaluation  I  Have replaced lisinopril with benicar due to the cough  I have ordered oxybutynin at night for the urine frequency Follow up with your new doctor to check on the blood in your urine  I have ordered a mammogram and a dexa scan You must find a new PCP because I will order the medical care you need but not be able to follow up on the results  Go back to the neurologist for the balance issues.  I will place referral  Try generic allergy med like cetirizine

## 2017-12-11 ENCOUNTER — Telehealth: Payer: Self-pay

## 2017-12-11 NOTE — Telephone Encounter (Signed)
Left generic message requesting call back.  

## 2017-12-19 ENCOUNTER — Encounter: Payer: Self-pay | Admitting: Cardiovascular Disease

## 2017-12-24 NOTE — Telephone Encounter (Signed)
Pt is returning a call  

## 2017-12-24 NOTE — Telephone Encounter (Signed)
Spoke with patient. Scheduled her for 5/8 to come in for pneumonia vac

## 2017-12-24 NOTE — Telephone Encounter (Signed)
Was speaking to patient regarding pneumonia vaccine she didn't get at her last visit with Dr.Nelson. We were disconnected.

## 2017-12-25 ENCOUNTER — Ambulatory Visit: Payer: Self-pay

## 2018-01-01 DIAGNOSIS — I10 Essential (primary) hypertension: Secondary | ICD-10-CM | POA: Diagnosis not present

## 2018-01-01 DIAGNOSIS — Z0100 Encounter for examination of eyes and vision without abnormal findings: Secondary | ICD-10-CM | POA: Diagnosis not present

## 2018-01-10 ENCOUNTER — Inpatient Hospital Stay (HOSPITAL_COMMUNITY)
Admission: EM | Admit: 2018-01-10 | Discharge: 2018-01-14 | DRG: 176 | Disposition: A | Discharge status: Home / Self Care | Payer: Medicare HMO | Attending: Family Medicine | Admitting: Family Medicine

## 2018-01-10 ENCOUNTER — Encounter (HOSPITAL_COMMUNITY): Payer: Self-pay | Admitting: Emergency Medicine

## 2018-01-10 ENCOUNTER — Other Ambulatory Visit: Payer: Self-pay

## 2018-01-10 ENCOUNTER — Emergency Department (HOSPITAL_COMMUNITY): Payer: Medicare HMO

## 2018-01-10 DIAGNOSIS — R079 Chest pain, unspecified: Secondary | ICD-10-CM | POA: Diagnosis not present

## 2018-01-10 DIAGNOSIS — I272 Pulmonary hypertension, unspecified: Secondary | ICD-10-CM | POA: Diagnosis not present

## 2018-01-10 DIAGNOSIS — I2699 Other pulmonary embolism without acute cor pulmonale: Secondary | ICD-10-CM | POA: Diagnosis present

## 2018-01-10 DIAGNOSIS — I10 Essential (primary) hypertension: Secondary | ICD-10-CM | POA: Diagnosis present

## 2018-01-10 DIAGNOSIS — R748 Abnormal levels of other serum enzymes: Secondary | ICD-10-CM | POA: Diagnosis not present

## 2018-01-10 DIAGNOSIS — R7989 Other specified abnormal findings of blood chemistry: Secondary | ICD-10-CM

## 2018-01-10 DIAGNOSIS — I361 Nonrheumatic tricuspid (valve) insufficiency: Secondary | ICD-10-CM | POA: Diagnosis not present

## 2018-01-10 DIAGNOSIS — M797 Fibromyalgia: Secondary | ICD-10-CM | POA: Diagnosis present

## 2018-01-10 DIAGNOSIS — R69 Illness, unspecified: Secondary | ICD-10-CM | POA: Diagnosis not present

## 2018-01-10 DIAGNOSIS — Z818 Family history of other mental and behavioral disorders: Secondary | ICD-10-CM

## 2018-01-10 DIAGNOSIS — N179 Acute kidney failure, unspecified: Secondary | ICD-10-CM

## 2018-01-10 DIAGNOSIS — I214 Non-ST elevation (NSTEMI) myocardial infarction: Secondary | ICD-10-CM | POA: Diagnosis present

## 2018-01-10 DIAGNOSIS — F419 Anxiety disorder, unspecified: Secondary | ICD-10-CM | POA: Diagnosis present

## 2018-01-10 DIAGNOSIS — R05 Cough: Secondary | ICD-10-CM | POA: Diagnosis not present

## 2018-01-10 DIAGNOSIS — Z833 Family history of diabetes mellitus: Secondary | ICD-10-CM

## 2018-01-10 DIAGNOSIS — Z23 Encounter for immunization: Secondary | ICD-10-CM

## 2018-01-10 DIAGNOSIS — Z79899 Other long term (current) drug therapy: Secondary | ICD-10-CM

## 2018-01-10 DIAGNOSIS — R778 Other specified abnormalities of plasma proteins: Secondary | ICD-10-CM

## 2018-01-10 DIAGNOSIS — R0602 Shortness of breath: Secondary | ICD-10-CM | POA: Diagnosis not present

## 2018-01-10 DIAGNOSIS — R0789 Other chest pain: Secondary | ICD-10-CM | POA: Diagnosis not present

## 2018-01-10 DIAGNOSIS — E785 Hyperlipidemia, unspecified: Secondary | ICD-10-CM | POA: Diagnosis present

## 2018-01-10 DIAGNOSIS — F17210 Nicotine dependence, cigarettes, uncomplicated: Secondary | ICD-10-CM | POA: Diagnosis present

## 2018-01-10 DIAGNOSIS — G40909 Epilepsy, unspecified, not intractable, without status epilepticus: Secondary | ICD-10-CM

## 2018-01-10 DIAGNOSIS — F17200 Nicotine dependence, unspecified, uncomplicated: Secondary | ICD-10-CM | POA: Diagnosis present

## 2018-01-10 DIAGNOSIS — I252 Old myocardial infarction: Secondary | ICD-10-CM

## 2018-01-10 DIAGNOSIS — Z8249 Family history of ischemic heart disease and other diseases of the circulatory system: Secondary | ICD-10-CM

## 2018-01-10 DIAGNOSIS — F329 Major depressive disorder, single episode, unspecified: Secondary | ICD-10-CM | POA: Diagnosis present

## 2018-01-10 HISTORY — DX: Acute kidney failure, unspecified: N17.9

## 2018-01-10 LAB — CBC
HEMATOCRIT: 42.4 % (ref 36.0–46.0)
HEMOGLOBIN: 13.7 g/dL (ref 12.0–15.0)
MCH: 30.9 pg (ref 26.0–34.0)
MCHC: 32.3 g/dL (ref 30.0–36.0)
MCV: 95.7 fL (ref 78.0–100.0)
Platelets: 262 10*3/uL (ref 150–400)
RBC: 4.43 MIL/uL (ref 3.87–5.11)
RDW: 14.6 % (ref 11.5–15.5)
WBC: 9.4 10*3/uL (ref 4.0–10.5)

## 2018-01-10 LAB — BASIC METABOLIC PANEL
ANION GAP: 9 (ref 5–15)
BUN: 19 mg/dL (ref 6–20)
CALCIUM: 10 mg/dL (ref 8.9–10.3)
CO2: 29 mmol/L (ref 22–32)
Chloride: 105 mmol/L (ref 101–111)
Creatinine, Ser: 1.7 mg/dL — ABNORMAL HIGH (ref 0.44–1.00)
GFR calc Af Amer: 34 mL/min — ABNORMAL LOW (ref >60)
GFR calc non Af Amer: 30 mL/min — ABNORMAL LOW (ref >60)
GLUCOSE: 120 mg/dL — AB (ref 65–99)
Potassium: 4.2 mmol/L (ref 3.5–5.1)
Sodium: 143 mmol/L (ref 135–145)

## 2018-01-10 LAB — TROPONIN I: Troponin I: 0.32 ng/mL (ref <0.03)

## 2018-01-10 MED ORDER — KETOROLAC TROMETHAMINE 30 MG/ML IJ SOLN
30.0000 mg | Freq: Once | INTRAMUSCULAR | Status: AC
  Administered 2018-01-10: 30 mg via INTRAVENOUS
  Filled 2018-01-10: qty 1

## 2018-01-10 MED ORDER — IPRATROPIUM-ALBUTEROL 0.5-2.5 (3) MG/3ML IN SOLN
3.0000 mL | Freq: Once | RESPIRATORY_TRACT | Status: AC
  Administered 2018-01-11: 3 mL via RESPIRATORY_TRACT
  Filled 2018-01-10: qty 3

## 2018-01-10 NOTE — ED Notes (Signed)
CRITICAL VALUE ALERT  Critical Value:  Troponin 0.32  Date & Time Notied:  01/10/2018 2354  Provider Notified: dr.delo  Orders Received/Actions taken: md notified

## 2018-01-10 NOTE — ED Provider Notes (Addendum)
[facility] EMERGENCY DEPARTMENT Provider Note   CSN: [id number] Arrival date & time: 01/10/18  2057     History   Chief Complaint Chief Complaint  Patient presents with  . Shortness of Breath    HPI Jennifer Jacobs is a 72 y.o. female.  Patient is a 72 year old female with past medical history of fibromyalgia, anxiety, alcohol abuse, hypertension.  She presents with a 2-day history of intermittent pain in the front of her chest with associated shortness of breath.  She describes this as a pressure that does not radiate to her arm or jaw.  She denies any nausea or diaphoresis.  The pain comes and goes and is not exacerbated by exertion.  She does report some productive cough recently.  She was given some sort of cough medication by her primary doctor, however this has not helped.  She denies any fevers or chills.  Has any leg pain or swelling.  She denies to me that she has any past cardiac history.  The history is provided by the patient.  Shortness of Breath  This is a new problem. The average episode lasts 2 days. The problem occurs intermittently.The problem has been gradually worsening. Associated symptoms include cough, sputum production and chest pain. Pertinent negatives include no fever, no leg pain and no leg swelling. It is unknown what precipitated the problem. She has tried nothing for the symptoms.    Past Medical History:  Diagnosis Date  . Acute blood loss anemia   . Alcohol abuse 07/05/2014  . Allergy   . Anxiety   . Anxiety and depression   . Arthritis   . Cognitive deficit due to recent stroke 11/22/2016  . Depression   . Dyslipidemia   . Fibromyalgia   . Hepatic cyst   . Hyperlipidemia   . Hypertension   . Leukocytosis   . Liver cyst 03/18/2014   01/26/2014 ultrasound Hypoechoic structure adjacent to gallbladder right lobe liver,  measuring 29 x 18 x 17 mm, possibly septated cyst    . MI (myocardial infarction) ([place]) 2007   By patient report, not  substantiated by recent nuclear stress test.  . Neuromuscular disorder ([place])   . Seizure disorder ([place])    none since 2002  . Seizures ([place])   . Smoker unmotivated to quit    Quit for 3 years and then restarted  . Substance abuse ([place])    ETOH  . Vision abnormalities     Patient Active Problem List   Diagnosis Date Noted  . Vascular headache   . Seizure prophylaxis   . Weakness of left third cranial nerve   . Gait disturbance, post-stroke 11/22/2016  . Benign essential HTN   . Seizure disorder ([place])   . Acute blood loss anemia   . 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  . Bilateral leg pain 06/26/2016  . AAA (abdominal aortic aneurysm) without rupture ([place]) 06/26/2016  . Insomnia 06/07/2015  . Urinary incontinence 06/07/2015  . Chronic pain syndrome 03/04/2015  . Fibromyalgia 03/04/2015  . Cervical radiculitis 03/04/2015  . Lumbar radicular pain 03/04/2015  . Major depression 07/05/2014  . Claudication, class I ([place]) 03/18/2013  . Smoker unmotivated to quit     Past Surgical History:  Procedure Laterality Date  . CRANIOTOMY Left 11/19/2016   Procedure: CRANIOTOMY INTRACRANIAL FOR  ANEURYSM CLIPPING;  Surgeon: Kevan Ny Ditty, MD;  Location: [facility];  Service: Neurosurgery;  Laterality: Left;  . ELBOW ARTHROPLASTY Left    rods  .  Exercise tolerance test  04/27/2013   CPET-MET: Submaximal effort (0.96, with goal of greater than 1.09) -- patient states that her effort was limited due to knee pain.  This makes the rest of the interpretation difficult: Peak VO2 - 10.5 (59% moderate), peak 02-pulse -  7..11 (low); heart rate 114 (73% -chronic incompetence considered);; PFTs normal   . MOUTH SURGERY    . NM MYOVIEW LTD  05/19/2013   LexiScan: EF 80%, no evidence of ischemia or infarction  . TRANSTHORACIC ECHOCARDIOGRAM  03/25/2013   EF 55-60%, no regional wall motion abnormalities; normal diastolic parameters, normal pulmonary pressures.  No valvular lesions  noted.  Essentially normal echo      OB History    Gravida  5   Para  5   Term  5   Preterm      AB      Living  3     SAB      TAB      Ectopic      Multiple      Live Births               Home Medications    Prior to Admission medications   Medication Sig Start Date End Date Taking? Authorizing Provider  acetaminophen (TYLENOL) 325 MG tablet Take 1-2 tablets (325-650 mg total) by mouth every 4 (four) hours as needed for mild pain. 11/27/16   LoveIvan Anchors, PA-C  aspirin EC 81 MG tablet Take 81 mg by mouth daily.    [provider]  escitalopram (LEXAPRO) 20 MG tablet Take 1 tablet (20 mg total) by mouth 2 (two) times daily. 12/09/17   Cloria Spring, MD  gabapentin (NEURONTIN) 800 MG tablet 800 mg 3 (three) times daily.  11/05/17   [provider]  Ibuprofen-Diphenhydramine HCl (ADVIL PM) 200-25 MG CAPS Take 1 capsule by mouth at bedtime.    [provider]  lamoTRIgine (LAMICTAL) 100 MG tablet Take 1 tablet (100 mg total) by mouth 2 (two) times daily. 12/09/17 12/09/18  Cloria Spring, MD  levETIRAcetam (KEPPRA) 500 MG tablet Take 1 tablet (500 mg total) 2 (two) times daily by mouth. 07/03/17   Raylene Everts, MD  metoprolol tartrate (LOPRESSOR) 25 MG tablet Take 1 tablet (25 mg total) by mouth 2 (two) times daily. 06/11/17   Raylene Everts, MD  naproxen sodium (ANAPROX) 220 MG tablet Take 220 mg by mouth daily as needed (pain).    [provider]  olmesartan (BENICAR) 20 MG tablet Take 1 tablet (20 mg total) by mouth daily. 12/10/17   Raylene Everts, MD  oxybutynin (DITROPAN-XL) 10 MG 24 hr tablet Take 1 tablet (10 mg total) by mouth at bedtime. 12/10/17   Raylene Everts, MD  potassium chloride SA (K-DUR,KLOR-CON) 20 MEQ tablet Take 1 tablet (20 mEq total) by mouth 2 (two) times daily. 06/11/17   Raylene Everts, MD  risperiDONE (RISPERDAL) 0.5 MG tablet Take 1 tablet (0.5 mg total) by mouth at bedtime. 12/09/17    Cloria Spring, MD  traZODone (DESYREL) 50 MG tablet Take 1 tablet (50 mg total) by mouth at bedtime. 12/09/17   Cloria Spring, MD    Family History Family History  Problem Relation Age of Onset  . Heart attack Father 65  . Hypertension Father   . Alcohol abuse Father   . Heart attack Maternal Grandmother   . Cancer Maternal Grandfather  type unknown  . Cancer Paternal Grandmother        type unknown  . Depression Mother   . Alcohol abuse Mother   . Ovarian cancer Paternal Aunt   . Cancer Paternal Aunt        ovarian  . Prostate cancer Paternal Uncle   . Stomach cancer Paternal Aunt   . Diabetes Daughter   . Heart disease Daughter 57       heart attack  . Depression Sister   . Alcohol abuse Brother   . Depression Sister   . Alcohol abuse Sister   . Depression Sister   . Alcohol abuse Sister   . Alcohol abuse Brother   . Alcohol abuse Son     Social History Social History   Tobacco Use  . Smoking status: Current Every Day Smoker    Packs/day: 0.50    Types: Cigarettes  . Smokeless tobacco: Never Used  . Tobacco comment: one pack every 3 months  Substance Use Topics  . Alcohol use: No    Comment: occasionally  . Drug use: Yes    Frequency: 2.0 times per week    Types: Marijuana    Comment: occasionally     Allergies   Prozac [fluoxetine hcl]   Review of Systems Review of Systems  Constitutional: Negative for fever.  Respiratory: Positive for cough, sputum production and shortness of breath.   Cardiovascular: Positive for chest pain. Negative for leg swelling.  All other systems reviewed and are negative.    Physical Exam Updated Vital Signs BP (!) 95/57   Pulse 83   Temp 98.5 F (36.9 C) (Oral)   Resp (!) 27   Ht 5' 8.5" (1.74 m)   Wt 93.4 kg (206 lb)   SpO2 94%   BMI 30.87 kg/m   Physical Exam  Constitutional: She is oriented to person, place, and time. She appears well-developed and well-nourished. No distress.  HENT:  Head:  Normocephalic and atraumatic.  Neck: Normal range of motion. Neck supple.  Cardiovascular: Normal rate and regular rhythm. Exam reveals no gallop and no friction rub.  No murmur heard. Pulmonary/Chest: Effort normal and breath sounds normal. No respiratory distress. She has no wheezes.  Abdominal: Soft. Bowel sounds are normal. She exhibits no distension. There is no tenderness.  Musculoskeletal: Normal range of motion.       Right lower leg: Normal. She exhibits no tenderness and no edema.       Left lower leg: Normal. She exhibits no tenderness and no edema.  Neurological: She is alert and oriented to person, place, and time.  Skin: Skin is warm and dry. She is not diaphoretic.  Nursing note and vitals reviewed.    ED Treatments / Results  Labs (all labs ordered are listed, but only abnormal results are displayed) Labs Reviewed  CBC  BASIC METABOLIC PANEL  TROPONIN I    EKG EKG Interpretation  Date/Time:  Friday Jan 10 2018 21:06:41 EDT Ventricular Rate:  85 PR Interval:  148 QRS Duration: 106 QT Interval:  390 QTC Calculation: 464 R Axis:   -64 Text Interpretation:  Normal sinus rhythm Possible Left atrial enlargement Left axis deviation Incomplete left bundle branch block Abnormal ECG Confirmed by Veryl Speak [phone]) on 01/10/2018 11:24:32 PM   Radiology No results found.  Procedures Procedures (including critical care time)  Medications Ordered in ED Medications  ketorolac (TORADOL) 30 MG/ML injection 30 mg (has no administration in time range)  ipratropium-albuterol (DUONEB) 0.5-2.5 (3)  MG/3ML nebulizer solution 3 mL (has no administration in time range)     Initial Impression / Assessment and Plan / ED Course  I have reviewed the triage vital signs and the nursing notes.  Pertinent labs & imaging results that were available during my care of the patient were reviewed by me and considered in my medical decision making (see chart for details).  Patient's  troponin returns mildly elevated.  The significance of this I am uncertain.  I have discussed this with Dr. Elson Areas who is on-call with cardiology.  His recommendation is admission to the hospitalist service for cycling of the troponin.  She will be started on heparin.  Dr. Manuella Ghazi agrees to admit.  CRITICAL CARE Performed by: Veryl Speak Total critical care time: 35 minutes Critical care time was exclusive of separately billable procedures and treating other patients. Critical care was necessary to treat or prevent imminent or life-threatening deterioration. Critical care was time spent personally by me on the following activities: development of treatment plan with patient and/or surrogate as well as nursing, discussions with consultants, evaluation of patient's response to treatment, examination of patient, obtaining history from patient or surrogate, ordering and performing treatments and interventions, ordering and review of laboratory studies, ordering and review of radiographic studies, pulse oximetry and re-evaluation of patient's condition.   Final Clinical Impressions(s) / ED Diagnoses   Final diagnoses:  None    ED Discharge Orders    None       Veryl Speak, MD 01/11/18 8118    Veryl Speak, MD 01/11/18 [phone]

## 2018-01-10 NOTE — ED Triage Notes (Signed)
Patient reports chest pain, dizziness and SOB that started yesterday.

## 2018-01-11 ENCOUNTER — Inpatient Hospital Stay (HOSPITAL_COMMUNITY): Payer: Medicare HMO

## 2018-01-11 ENCOUNTER — Encounter (HOSPITAL_COMMUNITY): Payer: Self-pay | Admitting: Internal Medicine

## 2018-01-11 ENCOUNTER — Other Ambulatory Visit (HOSPITAL_COMMUNITY): Payer: Self-pay | Admitting: *Deleted

## 2018-01-11 DIAGNOSIS — F17200 Nicotine dependence, unspecified, uncomplicated: Secondary | ICD-10-CM | POA: Diagnosis not present

## 2018-01-11 DIAGNOSIS — Z79899 Other long term (current) drug therapy: Secondary | ICD-10-CM | POA: Diagnosis not present

## 2018-01-11 DIAGNOSIS — Z818 Family history of other mental and behavioral disorders: Secondary | ICD-10-CM | POA: Diagnosis not present

## 2018-01-11 DIAGNOSIS — E785 Hyperlipidemia, unspecified: Secondary | ICD-10-CM | POA: Diagnosis present

## 2018-01-11 DIAGNOSIS — F329 Major depressive disorder, single episode, unspecified: Secondary | ICD-10-CM | POA: Diagnosis present

## 2018-01-11 DIAGNOSIS — N179 Acute kidney failure, unspecified: Secondary | ICD-10-CM | POA: Diagnosis not present

## 2018-01-11 DIAGNOSIS — R778 Other specified abnormalities of plasma proteins: Secondary | ICD-10-CM

## 2018-01-11 DIAGNOSIS — R69 Illness, unspecified: Secondary | ICD-10-CM | POA: Diagnosis not present

## 2018-01-11 DIAGNOSIS — R079 Chest pain, unspecified: Secondary | ICD-10-CM | POA: Diagnosis not present

## 2018-01-11 DIAGNOSIS — I361 Nonrheumatic tricuspid (valve) insufficiency: Secondary | ICD-10-CM | POA: Diagnosis not present

## 2018-01-11 DIAGNOSIS — I2699 Other pulmonary embolism without acute cor pulmonale: Secondary | ICD-10-CM | POA: Diagnosis not present

## 2018-01-11 DIAGNOSIS — R748 Abnormal levels of other serum enzymes: Secondary | ICD-10-CM

## 2018-01-11 DIAGNOSIS — Z8249 Family history of ischemic heart disease and other diseases of the circulatory system: Secondary | ICD-10-CM | POA: Diagnosis not present

## 2018-01-11 DIAGNOSIS — I252 Old myocardial infarction: Secondary | ICD-10-CM | POA: Diagnosis not present

## 2018-01-11 DIAGNOSIS — M797 Fibromyalgia: Secondary | ICD-10-CM | POA: Diagnosis not present

## 2018-01-11 DIAGNOSIS — Z23 Encounter for immunization: Secondary | ICD-10-CM | POA: Diagnosis not present

## 2018-01-11 DIAGNOSIS — F419 Anxiety disorder, unspecified: Secondary | ICD-10-CM | POA: Diagnosis present

## 2018-01-11 DIAGNOSIS — G40909 Epilepsy, unspecified, not intractable, without status epilepticus: Secondary | ICD-10-CM | POA: Diagnosis not present

## 2018-01-11 DIAGNOSIS — Z833 Family history of diabetes mellitus: Secondary | ICD-10-CM | POA: Diagnosis not present

## 2018-01-11 DIAGNOSIS — I214 Non-ST elevation (NSTEMI) myocardial infarction: Secondary | ICD-10-CM | POA: Diagnosis not present

## 2018-01-11 DIAGNOSIS — I10 Essential (primary) hypertension: Secondary | ICD-10-CM

## 2018-01-11 DIAGNOSIS — R7989 Other specified abnormal findings of blood chemistry: Secondary | ICD-10-CM

## 2018-01-11 DIAGNOSIS — I272 Pulmonary hypertension, unspecified: Secondary | ICD-10-CM | POA: Diagnosis not present

## 2018-01-11 DIAGNOSIS — R609 Edema, unspecified: Secondary | ICD-10-CM | POA: Diagnosis not present

## 2018-01-11 DIAGNOSIS — M7989 Other specified soft tissue disorders: Secondary | ICD-10-CM | POA: Diagnosis not present

## 2018-01-11 DIAGNOSIS — R0789 Other chest pain: Secondary | ICD-10-CM | POA: Diagnosis present

## 2018-01-11 DIAGNOSIS — F17210 Nicotine dependence, cigarettes, uncomplicated: Secondary | ICD-10-CM | POA: Diagnosis present

## 2018-01-11 HISTORY — DX: Acute kidney failure, unspecified: N17.9

## 2018-01-11 LAB — HEPARIN LEVEL (UNFRACTIONATED): Heparin Unfractionated: 0.4 IU/mL (ref 0.30–0.70)

## 2018-01-11 LAB — CBC
HEMATOCRIT: 39.6 % (ref 36.0–46.0)
HEMOGLOBIN: 12.8 g/dL (ref 12.0–15.0)
MCH: 30.8 pg (ref 26.0–34.0)
MCHC: 32.3 g/dL (ref 30.0–36.0)
MCV: 95.4 fL (ref 78.0–100.0)
Platelets: 227 10*3/uL (ref 150–400)
RBC: 4.15 MIL/uL (ref 3.87–5.11)
RDW: 14.6 % (ref 11.5–15.5)
WBC: 10.6 10*3/uL — AB (ref 4.0–10.5)

## 2018-01-11 LAB — BASIC METABOLIC PANEL
Anion gap: 6 (ref 5–15)
BUN: 21 mg/dL — AB (ref 6–20)
CO2: 31 mmol/L (ref 22–32)
Calcium: 9.5 mg/dL (ref 8.9–10.3)
Chloride: 103 mmol/L (ref 101–111)
Creatinine, Ser: 1.69 mg/dL — ABNORMAL HIGH (ref 0.44–1.00)
GFR, EST AFRICAN AMERICAN: 35 mL/min — AB (ref >60)
GFR, EST NON AFRICAN AMERICAN: 30 mL/min — AB (ref >60)
Glucose, Bld: 103 mg/dL — ABNORMAL HIGH (ref 65–99)
POTASSIUM: 4.5 mmol/L (ref 3.5–5.1)
SODIUM: 140 mmol/L (ref 135–145)

## 2018-01-11 LAB — ECHOCARDIOGRAM COMPLETE
HEIGHTINCHES: 68.5 in
Weight: 3296 oz

## 2018-01-11 LAB — TROPONIN I
TROPONIN I: 0.24 ng/mL — AB (ref <0.03)
Troponin I: 0.29 ng/mL (ref <0.03)
Troponin I: 0.19 ng/mL (ref <0.03)

## 2018-01-11 LAB — PROTIME-INR
INR: 1
Prothrombin Time: 13.1 seconds (ref 11.4–15.2)

## 2018-01-11 LAB — APTT: aPTT: 29 seconds (ref 24–36)

## 2018-01-11 MED ORDER — NICOTINE 7 MG/24HR TD PT24
7.0000 mg | MEDICATED_PATCH | Freq: Every day | TRANSDERMAL | Status: DC
Start: 2018-01-11 — End: 2018-01-14
  Administered 2018-01-11 – 2018-01-14 (×4): 7 mg via TRANSDERMAL
  Filled 2018-01-11 (×6): qty 1

## 2018-01-11 MED ORDER — METOPROLOL TARTRATE 25 MG PO TABS
25.0000 mg | ORAL_TABLET | Freq: Two times a day (BID) | ORAL | Status: DC
  Administered 2018-01-11 – 2018-01-14 (×7): 25 mg via ORAL
  Filled 2018-01-11 (×7): qty 1

## 2018-01-11 MED ORDER — HEPARIN BOLUS VIA INFUSION
4000.0000 [IU] | Freq: Once | INTRAVENOUS | Status: AC
  Administered 2018-01-11: 4000 [IU] via INTRAVENOUS

## 2018-01-11 MED ORDER — LACTATED RINGERS IV SOLN
INTRAVENOUS | Status: AC
  Administered 2018-01-11: 06:00:00 via INTRAVENOUS

## 2018-01-11 MED ORDER — OXYBUTYNIN CHLORIDE ER 5 MG PO TB24
10.0000 mg | ORAL_TABLET | Freq: Every day | ORAL | Status: DC
  Administered 2018-01-12 – 2018-01-13 (×2): 10 mg via ORAL
  Filled 2018-01-11: qty 1
  Filled 2018-01-11: qty 2
  Filled 2018-01-11 (×2): qty 1
  Filled 2018-01-11: qty 2

## 2018-01-11 MED ORDER — HYDRALAZINE HCL 20 MG/ML IJ SOLN: 10.0000 mg | INTRAMUSCULAR | Status: DC | PRN

## 2018-01-11 MED ORDER — HEPARIN (PORCINE) IN NACL 100-0.45 UNIT/ML-% IJ SOLN
1100.0000 [IU]/h | INTRAMUSCULAR | Status: DC
  Administered 2018-01-11 – 2018-01-13 (×4): 1100 [IU]/h via INTRAVENOUS
  Filled 2018-01-11 (×4): qty 250

## 2018-01-11 MED ORDER — ONDANSETRON HCL 4 MG/2ML IJ SOLN: 4.0000 mg | Freq: Four times a day (QID) | INTRAMUSCULAR | Status: DC | PRN

## 2018-01-11 MED ORDER — LEVETIRACETAM 500 MG PO TABS
500.0000 mg | ORAL_TABLET | Freq: Two times a day (BID) | ORAL | Status: DC
  Administered 2018-01-11 – 2018-01-14 (×7): 500 mg via ORAL
  Filled 2018-01-11 (×7): qty 1

## 2018-01-11 MED ORDER — ASPIRIN EC 81 MG PO TBEC
81.0000 mg | DELAYED_RELEASE_TABLET | Freq: Every day | ORAL | Status: DC
  Administered 2018-01-11 – 2018-01-14 (×4): 81 mg via ORAL
  Filled 2018-01-11 (×4): qty 1

## 2018-01-11 MED ORDER — ACETAMINOPHEN 650 MG RE SUPP: 650.0000 mg | Freq: Four times a day (QID) | RECTAL | Status: DC | PRN

## 2018-01-11 MED ORDER — NITROGLYCERIN 0.4 MG SL SUBL
0.4000 mg | SUBLINGUAL_TABLET | SUBLINGUAL | Status: DC | PRN
Start: 2018-01-11 — End: 2018-01-14

## 2018-01-11 MED ORDER — GABAPENTIN 400 MG PO CAPS
800.0000 mg | ORAL_CAPSULE | Freq: Three times a day (TID) | ORAL | Status: DC
  Administered 2018-01-11 – 2018-01-12 (×3): 800 mg via ORAL
  Filled 2018-01-11 (×3): qty 2

## 2018-01-11 MED ORDER — PNEUMOCOCCAL VAC POLYVALENT 25 MCG/0.5ML IJ INJ
0.5000 mL | INJECTION | INTRAMUSCULAR | Status: AC
  Administered 2018-01-12: 0.5 mL via INTRAMUSCULAR
  Filled 2018-01-11: qty 0.5

## 2018-01-11 MED ORDER — GABAPENTIN 800 MG PO TABS
800.0000 mg | ORAL_TABLET | Freq: Three times a day (TID) | ORAL | Status: DC
  Filled 2018-01-11 (×7): qty 1

## 2018-01-11 MED ORDER — OXYBUTYNIN CHLORIDE ER 5 MG PO TB24
ORAL_TABLET | ORAL | Status: AC
  Filled 2018-01-11: qty 2

## 2018-01-11 MED ORDER — ESCITALOPRAM OXALATE 10 MG PO TABS
20.0000 mg | ORAL_TABLET | Freq: Two times a day (BID) | ORAL | Status: DC
  Administered 2018-01-11 – 2018-01-12 (×3): 20 mg via ORAL
  Filled 2018-01-11: qty 2
  Filled 2018-01-11 (×2): qty 1
  Filled 2018-01-11: qty 2
  Filled 2018-01-11 (×2): qty 1
  Filled 2018-01-11: qty 2
  Filled 2018-01-11: qty 1

## 2018-01-11 MED ORDER — LAMOTRIGINE 100 MG PO TABS
100.0000 mg | ORAL_TABLET | Freq: Two times a day (BID) | ORAL | Status: DC
  Administered 2018-01-11 – 2018-01-14 (×7): 100 mg via ORAL
  Filled 2018-01-11 (×5): qty 1
  Filled 2018-01-11: qty 4
  Filled 2018-01-11: qty 1

## 2018-01-11 MED ORDER — ONDANSETRON HCL 4 MG PO TABS
4.0000 mg | ORAL_TABLET | Freq: Four times a day (QID) | ORAL | Status: DC | PRN
  Administered 2018-01-11: 4 mg via ORAL
  Filled 2018-01-11: qty 1

## 2018-01-11 MED ORDER — ACETAMINOPHEN 325 MG PO TABS: 650.0000 mg | ORAL_TABLET | Freq: Four times a day (QID) | ORAL | Status: DC | PRN

## 2018-01-11 MED ORDER — RISPERIDONE 0.5 MG PO TABS
0.5000 mg | ORAL_TABLET | Freq: Every day | ORAL | Status: DC
  Administered 2018-01-11 – 2018-01-13 (×3): 0.5 mg via ORAL
  Filled 2018-01-11 (×4): qty 1

## 2018-01-11 MED ORDER — TRAZODONE HCL 50 MG PO TABS
50.0000 mg | ORAL_TABLET | Freq: Every day | ORAL | Status: DC
  Administered 2018-01-11 – 2018-01-13 (×3): 50 mg via ORAL
  Filled 2018-01-11 (×3): qty 1

## 2018-01-11 NOTE — ED Notes (Signed)
Date and time results received: 01/11/18 1:22 PM  (use smartphrase ".now" to insert current time)  Test: Troponin Critical Value: 0.24  Name of Provider Notified: Nelson Chimes  Orders Received? Or Actions Taken?: Orders Received - See Orders for details

## 2018-01-11 NOTE — ED Notes (Signed)
Pt sleeping at this time.

## 2018-01-11 NOTE — Consult Note (Signed)
CONSULTATION NOTE   Patient Name: Jennifer Jacobs Date of Encounter: 01/11/2018 Cardiologist: Glenetta Hew, MD  Chief Complaint   Chest pain  Patient Profile   72 yo female with multiple medical problems including etoh abuse, anxiety/depression, dyslipidemia, fibromyalgia and possible history of prior MI, presented to [facility] with chest pain and ruled-in for ACS with troponin of 0.3, trending down to 0.19. Transferred to [facility] for further care.  HPI   Jennifer Jacobs is a 72 y.o. female who is being seen today for the evaluation of NSTEMI at the request of Dr. Denton Brick. This is a 72 yo female with multiple medical problems including etoh abuse, anxiety/depression, dyslipidemia, fibromyalgia and possible history of prior MI, presented to [facility] with chest pain and ruled-in for ACS with troponin of 0.3, trending down to 0.19. Transferred to [facility] for further care. Currently she is chest pain free. Cardiology is asked to evaluate for chest pain. She described the chest pain as lower substernal pressure, radiating around the right breast, associated with nausea and vomiting and poor oral intake. There has been dyspnea and associated palpitations. Pain is worse with deep breathing EKG shows an incomplete LBBB and CXR was unremarkable.  PMHx   Past Medical History:  Diagnosis Date  . Acute blood loss anemia   . AKI (acute kidney injury) ([place]) 01/11/2018  . Alcohol abuse 07/05/2014  . Allergy   . Anxiety   . Anxiety and depression   . Arthritis   . Cognitive deficit due to recent stroke 11/22/2016  . Depression   . Dyslipidemia   . Fibromyalgia   . Hepatic cyst   . Hyperlipidemia   . Hypertension   . Leukocytosis   . Liver cyst 03/18/2014   01/26/2014 ultrasound Hypoechoic structure adjacent to gallbladder right lobe liver,  measuring 29 x 18 x 17 mm, possibly septated cyst    . MI (myocardial infarction) ([place]) 2007   By patient report, not substantiated by recent nuclear stress  test.  . Neuromuscular disorder ([place])   . Seizure disorder ([place])    none since 2002  . Seizures ([place])   . Smoker unmotivated to quit    Quit for 3 years and then restarted  . Substance abuse ([place])    ETOH  . Vision abnormalities     Past Surgical History:  Procedure Laterality Date  . CRANIOTOMY Left 11/19/2016   Procedure: CRANIOTOMY INTRACRANIAL FOR  ANEURYSM CLIPPING;  Surgeon: Kevan Ny Ditty, MD;  Location: [place];  Service: Neurosurgery;  Laterality: Left;  . ELBOW ARTHROPLASTY Left    rods  . Exercise tolerance test  04/27/2013   CPET-MET: Submaximal effort (0.96, with goal of greater than 1.09) -- patient states that her effort was limited due to knee pain.  This makes the rest of the interpretation difficult: Peak VO2 - 10.5 (59% moderate), peak 02-pulse -  7..11 (low); heart rate 114 (73% -chronic incompetence considered);; PFTs normal   . MOUTH SURGERY    . NM MYOVIEW LTD  05/19/2013   LexiScan: EF 80%, no evidence of ischemia or infarction  . TRANSTHORACIC ECHOCARDIOGRAM  03/25/2013   EF 55-60%, no regional wall motion abnormalities; normal diastolic parameters, normal pulmonary pressures.  No valvular lesions noted.  Essentially normal echo     FAMHx   Family History  Problem Relation Age of Onset  . Heart attack Father 44  . Hypertension Father   . Alcohol abuse Father   . Heart attack Maternal Grandmother   .  Cancer Maternal Grandfather        type unknown  . Cancer Paternal Grandmother        type unknown  . Depression Mother   . Alcohol abuse Mother   . Ovarian cancer Paternal Aunt   . Cancer Paternal Aunt        ovarian  . Prostate cancer Paternal Uncle   . Stomach cancer Paternal Aunt   . Diabetes Daughter   . Heart disease Daughter 106       heart attack  . Depression Sister   . Alcohol abuse Brother   . Depression Sister   . Alcohol abuse Sister   . Depression Sister   . Alcohol abuse Sister   . Alcohol abuse Brother   . Alcohol abuse Son       SOCHx    reports that she has been smoking cigarettes.  She has been smoking about 0.50 packs per day. She has never used smokeless tobacco. She reports that she has current or past drug history. Drug: Marijuana. Frequency: 2.00 times per week. She reports that she does not drink alcohol.  Outpatient Medications   No current facility-administered medications on file prior to encounter.    Current Outpatient Medications on File Prior to Encounter  Medication Sig Dispense Refill  . acetaminophen (TYLENOL) 325 MG tablet Take 1-2 tablets (325-650 mg total) by mouth every 4 (four) hours as needed for mild pain.    Marland Kitchen aspirin EC 81 MG tablet Take 81 mg by mouth daily.    Marland Kitchen escitalopram (LEXAPRO) 20 MG tablet Take 1 tablet (20 mg total) by mouth 2 (two) times daily. 180 tablet 2  . gabapentin (NEURONTIN) 800 MG tablet 800 mg 3 (three) times daily.     . Ibuprofen-Diphenhydramine HCl (ADVIL PM) 200-25 MG CAPS Take 1 capsule by mouth at bedtime.    . lamoTRIgine (LAMICTAL) 100 MG tablet Take 1 tablet (100 mg total) by mouth 2 (two) times daily. 180 tablet 1  . levETIRAcetam (KEPPRA) 500 MG tablet Take 1 tablet (500 mg total) 2 (two) times daily by mouth. 180 tablet 1  . metoprolol tartrate (LOPRESSOR) 25 MG tablet Take 1 tablet (25 mg total) by mouth 2 (two) times daily. 180 tablet 1  . naproxen sodium (ANAPROX) 220 MG tablet Take 220 mg by mouth daily as needed (pain).    Marland Kitchen olmesartan (BENICAR) 20 MG tablet Take 1 tablet (20 mg total) by mouth daily. 90 tablet 0  . oxybutynin (DITROPAN-XL) 10 MG 24 hr tablet Take 1 tablet (10 mg total) by mouth at bedtime. 90 tablet 0  . potassium chloride SA (K-DUR,KLOR-CON) 20 MEQ tablet Take 1 tablet (20 mEq total) by mouth 2 (two) times daily. 180 tablet 1  . risperiDONE (RISPERDAL) 0.5 MG tablet Take 1 tablet (0.5 mg total) by mouth at bedtime. 90 tablet 2  . traZODone (DESYREL) 50 MG tablet Take 1 tablet (50 mg total) by mouth at bedtime. 90 tablet 2     Inpatient Medications    Scheduled Meds: . aspirin EC  81 mg Oral Daily  . escitalopram  20 mg Oral BID  . gabapentin  800 mg Oral TID  . lamoTRIgine  100 mg Oral BID  . levETIRAcetam  500 mg Oral BID  . metoprolol tartrate  25 mg Oral BID  . nicotine  7 mg Transdermal Daily  . oxybutynin  10 mg Oral QHS  . risperiDONE  0.5 mg Oral QHS  . traZODone  50 mg  Oral QHS    Continuous Infusions: . heparin 1,100 Units/hr (01/11/18 0531)    PRN Meds: acetaminophen **OR** acetaminophen, hydrALAZINE, nitroGLYCERIN, ondansetron **OR** ondansetron (ZOFRAN) IV   ALLERGIES   Allergies  Allergen Reactions  . Prozac [Fluoxetine Hcl] Other (See Comments)    Headaches    ROS   Pertinent items noted in HPI and remainder of comprehensive ROS otherwise negative.  Vitals   Vitals:   01/11/18 1422 01/11/18 1436 01/11/18 1454 01/11/18 1603  BP: 98/67  103/67   Pulse: 80  78   Resp: 19  18   Temp:  97.9 F (36.6 C) 97.9 F (36.6 C)   TempSrc:  Oral Oral   SpO2: 97%  99%   Weight:    190 lb 12.8 oz (86.5 kg)  Height:    '5\' 8"'$  (1.727 m)   No intake or output data in the 24 hours ending 01/11/18 1620 Filed Weights   01/10/18 2102 01/11/18 1603  Weight: 206 lb (93.4 kg) 190 lb 12.8 oz (86.5 kg)    Physical Exam   General appearance: alert and no distress Neck: no carotid bruit, no JVD and thyroid not enlarged, symmetric, no tenderness/mass/nodules Lungs: diminished breath sounds bilaterally Heart: regular rate and rhythm, S1, S2 normal, no murmur, click, rub or gallop Abdomen: soft, non-tender; bowel sounds normal; no masses,  no organomegaly Extremities: extremities normal, atraumatic, no cyanosis or edema Pulses: 2+ and symmetric Skin: Skin color, texture, turgor normal. No rashes or lesions Neurologic: Grossly normal Psych: Pleasant  Labs   Results for orders placed or performed during the hospital encounter of 01/10/18 (from the past 48 hour(s))  Basic metabolic  panel     Status: Abnormal   Collection Time: 01/10/18  9:44 PM  Result Value Ref Range   Sodium 143 135 - 145 mmol/L   Potassium 4.2 3.5 - 5.1 mmol/L   Chloride 105 101 - 111 mmol/L   CO2 29 22 - 32 mmol/L   Glucose, Bld 120 (H) 65 - 99 mg/dL   BUN 19 6 - 20 mg/dL   Creatinine, Ser 1.70 (H) 0.44 - 1.00 mg/dL   Calcium 10.0 8.9 - 10.3 mg/dL   GFR calc non Af Amer 30 (L) >60 mL/min   GFR calc Af Amer 34 (L) >60 mL/min    Comment: (NOTE) The eGFR has been calculated using the CKD EPI equation. This calculation has not been validated in all clinical situations. eGFR's persistently <60 mL/min signify possible Chronic Kidney Disease.    Anion gap 9 5 - 15    Comment: Performed at [facility], [address], [place], [place] [address]  CBC     Status: None   Collection Time: 01/10/18  9:44 PM  Result Value Ref Range   WBC 9.4 4.0 - 10.5 K/uL   RBC 4.43 3.87 - 5.11 MIL/uL   Hemoglobin 13.7 12.0 - 15.0 g/dL   HCT 42.4 36.0 - 46.0 %   MCV 95.7 78.0 - 100.0 fL   MCH 30.9 26.0 - 34.0 pg   MCHC 32.3 30.0 - 36.0 g/dL   RDW 14.6 11.5 - 15.5 %   Platelets 262 150 - 400 K/uL    Comment: Performed at [facility], [address], [place], [place] [address]  Troponin I     Status: Abnormal   Collection Time: 01/10/18  9:44 PM  Result Value Ref Range   Troponin I 0.32 (HH) <0.03 ng/mL    Comment: CRITICAL RESULT CALLED TO, READ  BACK BY AND VERIFIED WITH: MYRICK,B @ 6701 ON 01/10/18 BY JUW Performed at [facility], [address], [place], [place] [address]   Troponin I     Status: Abnormal   Collection Time: 01/11/18  1:41 AM  Result Value Ref Range   Troponin I 0.29 (HH) <0.03 ng/mL    Comment: CRITICAL VALUE NOTED.  VALUE IS CONSISTENT WITH PREVIOUSLY REPORTED AND CALLED VALUE. Performed at [facility], [address], [place], [place] [address]   Basic metabolic panel     Status: Abnormal   Collection Time: 01/11/18  2:01 AM  Result Value Ref Range   Sodium 140 135 - 145 mmol/L    Potassium 4.5 3.5 - 5.1 mmol/L   Chloride 103 101 - 111 mmol/L   CO2 31 22 - 32 mmol/L   Glucose, Bld 103 (H) 65 - 99 mg/dL   BUN 21 (H) 6 - 20 mg/dL   Creatinine, Ser 1.69 (H) 0.44 - 1.00 mg/dL   Calcium 9.5 8.9 - 10.3 mg/dL   GFR calc non Af Amer 30 (L) >60 mL/min   GFR calc Af Amer 35 (L) >60 mL/min    Comment: (NOTE) The eGFR has been calculated using the CKD EPI equation. This calculation has not been validated in all clinical situations. eGFR's persistently <60 mL/min signify possible Chronic Kidney Disease.    Anion gap 6 5 - 15    Comment: Performed at [facility], [address], [place], [place] [address]  CBC     Status: Abnormal   Collection Time: 01/11/18  2:01 AM  Result Value Ref Range   WBC 10.6 (H) 4.0 - 10.5 K/uL   RBC 4.15 3.87 - 5.11 MIL/uL   Hemoglobin 12.8 12.0 - 15.0 g/dL   HCT 39.6 36.0 - 46.0 %   MCV 95.4 78.0 - 100.0 fL   MCH 30.8 26.0 - 34.0 pg   MCHC 32.3 30.0 - 36.0 g/dL   RDW 14.6 11.5 - 15.5 %   Platelets 227 150 - 400 K/uL    Comment: Performed at [facility], [address] [place], [place] [address]  APTT     Status: None   Collection Time: 01/11/18  2:01 AM  Result Value Ref Range   aPTT 29 24 - 36 seconds    Comment: Performed at [facility], [address] [place], [place] [address]  Protime-INR     Status: None   Collection Time: 01/11/18  2:01 AM  Result Value Ref Range   Prothrombin Time 13.1 11.4 - 15.2 seconds   INR 1.00     Comment: Performed at [facility], [address] [place], [place] [address]  Troponin I     Status: Abnormal   Collection Time: 01/11/18  7:42 AM  Result Value Ref Range   Troponin I 0.19 (HH) <0.03 ng/mL    Comment: CRITICAL RESULT CALLED TO, READ BACK BY AND VERIFIED WITH: MANDY @ 7943 ON [id number] BY HENDERSON L. Performed at [facility], [address], [place], [place] [address]   Heparin level (unfractionated)     Status: None   Collection Time: 01/11/18  9:00 AM  Result Value Ref Range    Heparin Unfractionated 0.40 0.30 - 0.70 IU/mL    Comment: (NOTE) If heparin results are below expected values, and patient dosage has  been confirmed, suggest follow up testing of antithrombin III levels. Performed at [facility], [address], [place],  [address]   Troponin I  Status: Abnormal   Collection Time: 01/11/18 12:37 PM  Result Value Ref Range   Troponin I 0.24 (HH) <0.03 ng/mL    Comment: CRITICAL RESULT CALLED TO, READ BACK BY AND VERIFIED WITH: MANDY @ 1610 ON [id number] BY HENDERSON L. Performed at [facility], [address], [place], [place] [address]     ECG   Sinus with incomplete LBBB/IVCD at 85 - Personally Reviewed  Telemetry   N/A  Radiology   Dg Chest 2 View  Result Date: 01/10/2018 CLINICAL DATA:  Shortness of breath EXAM: CHEST - 2 VIEW COMPARISON:  02/06/2016 FINDINGS: Mild bronchitic changes. No acute opacity or pleural effusion. Normal heart size. Aortic atherosclerosis. No pneumothorax. IMPRESSION: No active cardiopulmonary disease. Electronically Signed   By: Donavan Foil M.D.   On: 01/10/2018 23:48    Cardiac Studies   LV EF: 60% -   65%  ------------------------------------------------------------------- History:   PMH:  Elevated Troponin. Elevated Troponin.  PMH: Myocardial infarction.  Risk factors:  Current tobacco use.  ------------------------------------------------------------------- Study Conclusions  - Left ventricle: The cavity size was normal. Wall thickness was   increased in a pattern of moderate LVH. Systolic function was   normal. The estimated ejection fraction was in the range of 60%   to 65%. Wall motion was normal; there were no regional wall   motion abnormalities. Doppler parameters are consistent with   abnormal left ventricular relaxation (grade 1 diastolic   dysfunction). The E/e&' ratio is <8, suggesting normal LV filling   pressure. - Left atrium: The atrium was normal in size. - Right  ventricle: The cavity size was mildly dilated. Mildly   reduced systolic function. - Right atrium: The atrium was mildly dilated. - Tricuspid valve: There was mild regurgitation. - Pulmonary arteries: PA peak pressure: 57 mm Hg (S). - Inferior vena cava: The vessel was normal in size. The   respirophasic diameter changes were in the normal range (>= 50%),   consistent with normal central venous pressure.  Impressions:  - Compared to a prior study in 03/2013, the LVEF is higher at   60-65%. The RVSP is also higher at 57 mmHg.  Impression   Principal Problem:   Chest pain Active Problems:   Smoker unmotivated to quit   Major depression   Fibromyalgia   Benign essential HTN   Seizure disorder (HCC)   Elevated troponin   AKI (acute kidney injury) ([place])   NSTEMI (non-ST elevated myocardial infarction) ([place])   Recommendation   1. Elevated troponin in the setting of right-sided chest pain. Echo with normal LV function, normal wall motion, but new pulmonary hypertension - ? PE. Will check d-dimer (low to intermediate prob) - she is on IV heparin. Given coronary risk factors and tobacco abuse, will likely proceed with cardiac cath next week (Tuesday at the earliest).  Thanks for the consultation.  Time Spent Directly with Patient:  I have spent a total of 45 minutes with the patient reviewing hospital notes, telemetry, EKGs, labs and examining the patient as well as establishing an assessment and plan that was discussed personally with the patient. > 50% of time was spent in direct patient care.  Length of Stay:  LOS: 0 days   Pixie Casino, MD, [facility], [place] Director of the Advanced Lipid Disorders &  Cardiovascular Risk Reduction Clinic Diplomate of the American Board of Clinical Lipidology Attending Cardiologist  Direct Dial: [phone]  Fax: [phone]  Website:  [email or web address]  Nadean Corwin Brianna Bennett 01/11/2018, 4:20  PM

## 2018-01-11 NOTE — Progress Notes (Signed)
  ANTICOAGULATION CONSULT NOTE  Pharmacy Consult for heparin Indication: chest pain/ACS  Allergies  Allergen Reactions  . Prozac [Fluoxetine Hcl] Other (See Comments)    Headaches    Patient Measurements: Height: 5' 8.5" (174 cm) Weight: 206 lb (93.4 kg) IBW/kg (Calculated) : 65.05 Heparin Dosing Weight: 85 kg  Vital Signs: Temp: 97.9 F (36.6 C) (05/25 0308) Temp Source: Oral (05/25 0308) BP: 120/76 (05/25 1145) Pulse Rate: 80 (05/25 1145)  Labs: Recent Labs    01/10/18 2144 01/11/18 0141 01/11/18 0201 01/11/18 0742 01/11/18 0900  HGB 13.7  --  12.8  --   --   HCT 42.4  --  39.6  --   --   PLT 262  --  227  --   --   APTT  --   --  29  --   --   LABPROT  --   --  13.1  --   --   INR  --   --  1.00  --   --   HEPARINUNFRC  --   --   --   --  0.40  CREATININE 1.70*  --  1.69*  --   --   TROPONINI 0.32* 0.29*  --  0.19*  --    Estimated Creatinine Clearance: 37.9 mL/min (A) (by C-G formula based on SCr of 1.69 mg/dL (H)).  Infusions:  . heparin 1,100 Units/hr (01/11/18 0531)  . lactated ringers 100 mL/hr at 01/11/18 0531   PRN: acetaminophen **OR** acetaminophen, hydrALAZINE, nitroGLYCERIN, ondansetron **OR** ondansetron (ZOFRAN) IV  Assessment: 72 yo female came to ED c/o chest pain and SOB.  EKG with normal sinus rhythm and incomplete left bundle branch block similar to prior EKG on 12/10/17.  Startied heparin and plan ontransferring to [place] for further cardiology evaluation.   Heparin level at goal, no bleeding noted.  Goal of Therapy:  Heparin level 0.3-0.7 units/ml Monitor platelets by anticoagulation protocol: Yes   Plan:  Continue heparin gtt at 1100 units/hr Check heparin level in 6 hours Daily Heparin Level and CBC Monitor for signs and symptoms of bleeding.   Mady Gemma 01/11/2018,12:03 PM

## 2018-01-11 NOTE — Plan of Care (Signed)
VSS. Denies Pain. Call bell in reach. Encouraged to call prn needs.

## 2018-01-11 NOTE — H&P (Addendum)
History and Physical    Jennifer Jacobs BOF:[id number] DOB: 1946-06-18 DOA: 01/10/2018  PCP: Patient, No Pcp Per   Patient coming from: Home  Chief Complaint: Chest pain/SOB  HPI: Jennifer Jacobs is a 72 y.o. female with medical history significant for hypertension, anxiety/depression, seizure disorder, ongoing tobacco abuse, prior history of alcohol abuse, and fibromyalgia who has presented to the emergency department with ongoing intermittent chest pain that began approximately 2 days ago.  The patient describes this as a pressure-like sensation in her lower substernal region that is not associated with anything that she does in particular.  She denies any radiation of the pain.  She states that the pain would last for several minutes and then spontaneously go away.  There has been some associated shortness of breath and palpitations with this along with diaphoresis, nausea, and vomiting.  As a result of the nausea and vomiting, she has had very little oral intake over the last 2 days and feels as though she is dry.  She appears to think that she may have had a heart attack several years ago, but cannot recollect much of the history surrounding this event. She denies any cough, fever, chills, lower extremity edema, orthopnea, or paroxysmal nocturnal dyspnea.   ED Course: All signs are stable.  Laboratory data remarkable for creatinine 1.7 with baseline of 0.8-1.  Additionally, patient has troponin of 0.32.  Two-view chest x-ray with no acute findings and EKG with normal sinus rhythm at 85 bpm and incomplete left bundle branch block that appears similar to prior EKG on 12/10/2017.  She is currently chest pain-free and denies any symptoms and appears hemodynamically stable.  ED physician did speak with Cardiology fellow on-call who agrees with transfer to [facility] for further evaluation by Cardiology.  Review of Systems: All others reviewed and otherwise negative.  Past Medical History:    Diagnosis Date  . Acute blood loss anemia   . AKI (acute kidney injury) ([place]) 01/11/2018  . Alcohol abuse 07/05/2014  . Allergy   . Anxiety   . Anxiety and depression   . Arthritis   . Cognitive deficit due to recent stroke 11/22/2016  . Depression   . Dyslipidemia   . Fibromyalgia   . Hepatic cyst   . Hyperlipidemia   . Hypertension   . Leukocytosis   . Liver cyst 03/18/2014   01/26/2014 ultrasound Hypoechoic structure adjacent to gallbladder right lobe liver,  measuring 29 x 18 x 17 mm, possibly septated cyst    . MI (myocardial infarction) ([place]) 2007   By patient report, not substantiated by recent nuclear stress test.  . Neuromuscular disorder ([place])   . Seizure disorder ([place])    none since 2002  . Seizures ([place])   . Smoker unmotivated to quit    Quit for 3 years and then restarted  . Substance abuse ([place])    ETOH  . Vision abnormalities     Past Surgical History:  Procedure Laterality Date  . CRANIOTOMY Left 11/19/2016   Procedure: CRANIOTOMY INTRACRANIAL FOR  ANEURYSM CLIPPING;  Surgeon: Kevan Ny Ditty, MD;  Location: [facility];  Service: Neurosurgery;  Laterality: Left;  . ELBOW ARTHROPLASTY Left    rods  . Exercise tolerance test  04/27/2013   CPET-MET: Submaximal effort (0.96, with goal of greater than 1.09) -- patient states that her effort was limited due to knee pain.  This makes the rest of the interpretation difficult: Peak VO2 - 10.5 (59% moderate), peak 02-pulse -  7..11 (low); heart rate 114 (73% -chronic incompetence considered);; PFTs normal   . MOUTH SURGERY    . NM MYOVIEW LTD  05/19/2013   LexiScan: EF 80%, no evidence of ischemia or infarction  . TRANSTHORACIC ECHOCARDIOGRAM  03/25/2013   EF 55-60%, no regional wall motion abnormalities; normal diastolic parameters, normal pulmonary pressures.  No valvular lesions noted.  Essentially normal echo      reports that she has been smoking cigarettes.  She has been smoking about 0.50 packs per day. She has  never used smokeless tobacco. She reports that she has current or past drug history. Drug: Marijuana. Frequency: 2.00 times per week. She reports that she does not drink alcohol.  Allergies  Allergen Reactions  . Prozac [Fluoxetine Hcl] Other (See Comments)    Headaches    Family History  Problem Relation Age of Onset  . Heart attack Father 70  . Hypertension Father   . Alcohol abuse Father   . Heart attack Maternal Grandmother   . Cancer Maternal Grandfather        type unknown  . Cancer Paternal Grandmother        type unknown  . Depression Mother   . Alcohol abuse Mother   . Ovarian cancer Paternal Aunt   . Cancer Paternal Aunt        ovarian  . Prostate cancer Paternal Uncle   . Stomach cancer Paternal Aunt   . Diabetes Daughter   . Heart disease Daughter 62       heart attack  . Depression Sister   . Alcohol abuse Brother   . Depression Sister   . Alcohol abuse Sister   . Depression Sister   . Alcohol abuse Sister   . Alcohol abuse Brother   . Alcohol abuse Son     Prior to Admission medications   Medication Sig Start Date End Date Taking? Authorizing Provider  acetaminophen (TYLENOL) 325 MG tablet Take 1-2 tablets (325-650 mg total) by mouth every 4 (four) hours as needed for mild pain. 11/27/16   LoveIvan Anchors, PA-C  aspirin EC 81 MG tablet Take 81 mg by mouth daily.    [provider]  escitalopram (LEXAPRO) 20 MG tablet Take 1 tablet (20 mg total) by mouth 2 (two) times daily. 12/09/17   Cloria Spring, MD  gabapentin (NEURONTIN) 800 MG tablet 800 mg 3 (three) times daily.  11/05/17   [provider]  Ibuprofen-Diphenhydramine HCl (ADVIL PM) 200-25 MG CAPS Take 1 capsule by mouth at bedtime.    [provider]  lamoTRIgine (LAMICTAL) 100 MG tablet Take 1 tablet (100 mg total) by mouth 2 (two) times daily. 12/09/17 12/09/18  Cloria Spring, MD  levETIRAcetam (KEPPRA) 500 MG tablet Take 1 tablet (500 mg total) 2 (two) times daily by mouth.  07/03/17   Raylene Everts, MD  metoprolol tartrate (LOPRESSOR) 25 MG tablet Take 1 tablet (25 mg total) by mouth 2 (two) times daily. 06/11/17   Raylene Everts, MD  naproxen sodium (ANAPROX) 220 MG tablet Take 220 mg by mouth daily as needed (pain).    [provider]  olmesartan (BENICAR) 20 MG tablet Take 1 tablet (20 mg total) by mouth daily. 12/10/17   Raylene Everts, MD  oxybutynin (DITROPAN-XL) 10 MG 24 hr tablet Take 1 tablet (10 mg total) by mouth at bedtime. 12/10/17   Raylene Everts, MD  potassium chloride SA (K-DUR,KLOR-CON) 20 MEQ tablet Take 1 tablet (20 mEq total)  by mouth 2 (two) times daily. 06/11/17   Raylene Everts, MD  risperiDONE (RISPERDAL) 0.5 MG tablet Take 1 tablet (0.5 mg total) by mouth at bedtime. 12/09/17   Cloria Spring, MD  traZODone (DESYREL) 50 MG tablet Take 1 tablet (50 mg total) by mouth at bedtime. 12/09/17   Cloria Spring, MD    Physical Exam: Vitals:   01/10/18 2130 01/10/18 2230 01/10/18 2353 01/11/18 0011  BP: (!) 95/57 102/72 135/82   Pulse: 83 86 75   Resp: (!) 27     Temp:      TempSrc:      SpO2: 94% 90% 91% 92%  Weight:      Height:        Constitutional: NAD, calm, comfortable Vitals:   01/10/18 2130 01/10/18 2230 01/10/18 2353 01/11/18 0011  BP: (!) 95/57 102/72 135/82   Pulse: 83 86 75   Resp: (!) 27     Temp:      TempSrc:      SpO2: 94% 90% 91% 92%  Weight:      Height:       Eyes: lids and conjunctivae normal ENMT: Mucous membranes are moist.  Neck: normal, supple Respiratory: clear to auscultation bilaterally. Normal respiratory effort. No accessory muscle use.  Cardiovascular: Regular rate and rhythm, no murmurs. No extremity edema. Abdomen: no tenderness, no distention. Bowel sounds positive.  Musculoskeletal:  No joint deformity upper and lower extremities.   Skin: no rashes, lesions, ulcers.  Psychiatric: Normal judgment and insight. Alert and oriented x 3. Normal mood.   Labs on  Admission: I have personally reviewed following labs and imaging studies  CBC: Recent Labs  Lab 01/10/18 2144  WBC 9.4  HGB 13.7  HCT 42.4  MCV 95.7  PLT 497   Basic Metabolic Panel: Recent Labs  Lab 01/10/18 2144  NA 143  K 4.2  CL 105  CO2 29  GLUCOSE 120*  BUN 19  CREATININE 1.70*  CALCIUM 10.0   GFR: Estimated Creatinine Clearance: 37.7 mL/min (A) (by C-G formula based on SCr of 1.7 mg/dL (H)). Liver Function Tests: No results for input(s): AST, ALT, ALKPHOS, BILITOT, PROT, ALBUMIN in the last 168 hours. No results for input(s): LIPASE, AMYLASE in the last 168 hours. No results for input(s): AMMONIA in the last 168 hours. Coagulation Profile: No results for input(s): INR, PROTIME in the last 168 hours. Cardiac Enzymes: Recent Labs  Lab 01/10/18 2144  TROPONINI 0.32*   BNP (last 3 results) No results for input(s): PROBNP in the last 8760 hours. HbA1C: No results for input(s): HGBA1C in the last 72 hours. CBG: No results for input(s): GLUCAP in the last 168 hours. Lipid Profile: No results for input(s): CHOL, HDL, LDLCALC, TRIG, CHOLHDL, LDLDIRECT in the last 72 hours. Thyroid Function Tests: No results for input(s): TSH, T4TOTAL, FREET4, T3FREE, THYROIDAB in the last 72 hours. Anemia Panel: No results for input(s): VITAMINB12, FOLATE, FERRITIN, TIBC, IRON, RETICCTPCT in the last 72 hours. Urine analysis:    Component Value Date/Time   COLORURINE YELLOW 12/08/2016 1748   APPEARANCEUR CLEAR 12/08/2016 1748   LABSPEC 1.014 12/08/2016 1748   PHURINE 8.0 12/08/2016 1748   GLUCOSEU NEGATIVE 12/08/2016 1748   HGBUR NEGATIVE 12/08/2016 1748   BILIRUBINUR negative 12/10/2017 1440   KETONESUR NEGATIVE 12/08/2016 1748   PROTEINUR negative 12/10/2017 1440   PROTEINUR NEGATIVE 12/08/2016 1748   UROBILINOGEN 1.0 12/10/2017 1440   NITRITE negative 12/10/2017 1440   NITRITE NEGATIVE 12/08/2016 1748  LEUKOCYTESUR Small (1+) (A) 12/10/2017 1440    Radiological  Exams on Admission: Dg Chest 2 View  Result Date: 01/10/2018 CLINICAL DATA:  Shortness of breath EXAM: CHEST - 2 VIEW COMPARISON:  02/06/2016 FINDINGS: Mild bronchitic changes. No acute opacity or pleural effusion. Normal heart size. Aortic atherosclerosis. No pneumothorax. IMPRESSION: No active cardiopulmonary disease. Electronically Signed   By: Donavan Foil M.D.   On: 01/10/2018 23:48    EKG: Independently reviewed. NSR at 85bpm with incomplete LBBB.  Assessment/Plan Principal Problem:   Chest pain Active Problems:   Smoker unmotivated to quit   Major depression   Fibromyalgia   Benign essential HTN   Seizure disorder (HCC)   Elevated troponin   AKI (acute kidney injury) ([place])    1. Chest pain with elevated troponin suspicious for NSTEMI.  Will initiate on heparin drip and maintain on aspirin.  Continue home beta-blocker.  Order 2D echocardiogram with prior performed on 03/2013 with LVEF 55 to 60%.  Nitroglycerin as needed for chest pain.  Consult to cardiology for evaluation.  Maintain on telemetry with transfer to [facility]. Trend troponins. EKG in AM. 2. AKI-likely prerenal.  Notably, she is also on NSAIDs which will be held. Monitor I/Os. Order IV fluid and monitor with repeat labs and avoid nephrotoxic agents.  Elevated troponin could be associated with this finding, but given risk factors and actual chest pain, will need cardiology evaluation. 3. Hypertension.  Continue metoprolol and hold olmesartan due to AKI.  Hydralazine pushes as needed. 4. Seizure disorder.  Continue Keppra and Lamictal. 5. Depression/anxiety.  Continue risperidone and Lexapro. 6. Ongoing tobacco abuse.  Nicotine patch.  Counseled on cessation.   DVT prophylaxis: Heparin drip Code Status: Full Family Communication: None; lives alone with dog Disposition Plan:Evaluation of ACS per Cardiology; AKI treatment Consults called:Cardiology by EDP Admission status: Inpatient, Tele   Mairead Schwarzkopf Darleen Crocker DO [facility] Pager [phone]  If 7PM-7AM, please contact night-coverage [email or web address] Password [facility]  01/11/2018, 12:43 AM

## 2018-01-11 NOTE — Progress Notes (Signed)
  ANTICOAGULATION CONSULT NOTE - Initial Consult  Pharmacy Consult for heparin Indication: chest pain/ACS  Allergies  Allergen Reactions  . Prozac [Fluoxetine Hcl] Other (See Comments)    Headaches    Patient Measurements: Height: 5' 8.5" (174 cm) Weight: 206 lb (93.4 kg) IBW/kg (Calculated) : 65.05 Heparin Dosing Weight: 85 kg  Vital Signs: Temp: 98.5 F (36.9 C) (05/24 2058) Temp Source: Oral (05/24 1610) BP: 133/103 (05/25 0000) Pulse Rate: 74 (05/25 0000)  Labs: Recent Labs    01/10/18 2144  HGB 13.7  HCT 42.4  PLT 262  CREATININE 1.70*  TROPONINI 0.32*    Estimated Creatinine Clearance: 37.7 mL/min (A) (by C-G formula based on SCr of 1.7 mg/dL (H)).   Medical History: Past Medical History:  Diagnosis Date  . Acute blood loss anemia   . AKI (acute kidney injury) ([facility]) 01/11/2018  . Alcohol abuse 07/05/2014  . Allergy   . Anxiety   . Anxiety and depression   . Arthritis   . Cognitive deficit due to recent stroke 11/22/2016  . Depression   . Dyslipidemia   . Fibromyalgia   . Hepatic cyst   . Hyperlipidemia   . Hypertension   . Leukocytosis   . Liver cyst 03/18/2014   01/26/2014 ultrasound Hypoechoic structure adjacent to gallbladder right lobe liver,  measuring 29 x 18 x 17 mm, possibly septated cyst    . MI (myocardial infarction) ([facility]) 2007   By patient report, not substantiated by recent nuclear stress test.  . Neuromuscular disorder (H[facility])   . Seizure disorder ([facility])    none since 2002  . Seizures ([facility])   . Smoker unmotivated to quit    Quit for 3 years and then restarted  . Substance abuse ([facility])    ETOH  . Vision abnormalities     Medications:   (Not in a hospital admission) Scheduled:  . aspirin EC  81 mg Oral Daily  . escitalopram  20 mg Oral BID  . gabapentin  800 mg Oral TID  . lamoTRIgine  100 mg Oral BID  . levETIRAcetam  500 mg Oral BID  . metoprolol tartrate  25 mg Oral BID  . nicotine  7 mg Transdermal Daily  . oxybutynin  10  mg Oral QHS  . risperiDONE  0.5 mg Oral QHS  . traZODone  50 mg Oral QHS   Infusions:  . lactated ringers     PRN: acetaminophen **OR** acetaminophen, hydrALAZINE, nitroGLYCERIN, ondansetron **OR** ondansetron (ZOFRAN) IV  Assessment: 72 yo female came to ED c/o chest pain and SOB.  EKG with normal sinus rhythm and incomplete left bundle branch block similar to prior EKG on 12/10/17.  Starting heparin and transferring to [facility] for further cardiology evaluation.   Goal of Therapy:  Heparin level 0.3-0.7 units/ml Monitor platelets by anticoagulation protocol: Yes   Plan:  Give 4000 units bolus x 1  Start heparin gtt at 1100 units/hr Check heparin level in 6 hours  Syniah Berne Scarlett 01/11/2018,1:32 AM

## 2018-01-11 NOTE — Progress Notes (Signed)
*  PRELIMINARY RESULTS* Echocardiogram 2D Echocardiogram has been performed.  Jennifer Jacobs 01/11/2018, 2:45 PM

## 2018-01-11 NOTE — Progress Notes (Signed)
   Transfer from [facility] for further cardiology evaluation due to NSTEMI with atypical chest pain  Case d/w Dr Rennis Golden.... The on-call cardiologist, official cardiology consult pending at this time  Continue IV heparin  Shon Hale, MD

## 2018-01-11 NOTE — ED Notes (Signed)
CRITICAL VALUE ALERT  Critical Value:  Troponin 0.24  Date & Time Notied:  01/11/78 @ 1329  Provider Notified: Dr [name]  Orders Received/Actions taken: No orders given ay this time.

## 2018-01-11 NOTE — ED Notes (Signed)
Patient having bedside Echocardiogram done, tolerating well. Patient requesting something to eat- hospitalist made aware-patient to have cardiac tray.

## 2018-01-11 NOTE — Progress Notes (Signed)
  PROGRESS NOTE  Patient admitted by Dr. Sherryll Burger this morning.  I saw the patient during the rounds, she was resting on the bed without any complaints.  Denied any chest pain, nausea, vomiting and any other complaints.  Reported this pain had been going on for about 2-3 days, not necessarily always exertional.  General = no fevers, chills, dizziness, malaise, fatigue HEENT/EYES = negative for pain, redness, loss of vision, double vision, blurred vision, loss of hearing, sore throat, hoarseness, dysphagia Cardiovascular= negative for chest pain, palpitation, murmurs, lower extremity swelling Respiratory/lungs= negative for shortness of breath, cough, hemoptysis, wheezing, mucus production Gastrointestinal= negative for nausea, vomiting,, abdominal pain, melena, hematemesis Genitourinary= negative for Dysuria, Hematuria, Change in Urinary Frequency MSK = Negative for arthralgia, myalgias, Back Pain, Joint swelling  Neurology= Negative for headache, seizures, numbness, tingling  Psychiatry= Negative for anxiety, depression, suicidal and homocidal ideation Allergy/Immunology= Medication/Food allergy as listed  Skin= Negative for Rash, lesions, ulcers, itching  Vitals:   01/11/18 0834 01/11/18 0900  BP: (!) 142/104 (!) 130/102  Pulse:  74  Resp:  16  Temp:    SpO2:  100%   Constitutional: NAD, calm, comfortable Eyes: PERRL, lids and conjunctivae normal ENMT: Mucous membranes are moist. Posterior pharynx clear of any exudate or lesions.Normal dentition.  Neck: normal, supple, no masses, no thyromegaly Respiratory: clear to auscultation bilaterally, no wheezing, no crackles. Normal respiratory effort. No accessory muscle use.  Cardiovascular: Regular rate and rhythm, no murmurs / rubs / gallops. No extremity edema. 2+ pedal pulses. No carotid bruits.  Abdomen: no tenderness, no masses palpated. No hepatosplenomegaly. Bowel sounds positive.  Musculoskeletal: no clubbing / cyanosis. No joint  deformity upper and lower extremities. Good ROM, no contractures. Normal muscle tone.  Skin: no rashes, lesions, ulcers. No induration Neurologic: CN 2-12 grossly intact. Sensation intact, DTR normal. Strength 5/5 in all 4.  Psychiatric: Normal judgment and insight. Alert and oriented x 3. Normal mood.   Assessment and plan: NSTEMI with atypical chest pain, improved - Initial troponin 0.3 which is trended down to 0.19.  Case discussed with cardiology by the admitting provider at [facility], awaiting transfer for further work-up.  In the meantime continue heparin drip  Rest of the management as planned by the admitting provider. Please call with any questions as needed while the patient is here.   Ankit Joline Maxcy, MD [facility] Pager [phone]   If 7PM-7AM, please contact night-coverage [email or web address] Password [email or web address] 01/11/2018, 10:02 AM

## 2018-01-12 ENCOUNTER — Other Ambulatory Visit: Payer: Self-pay

## 2018-01-12 DIAGNOSIS — I272 Pulmonary hypertension, unspecified: Secondary | ICD-10-CM

## 2018-01-12 DIAGNOSIS — N179 Acute kidney failure, unspecified: Secondary | ICD-10-CM

## 2018-01-12 LAB — CBC
HCT: 38.6 % (ref 36.0–46.0)
Hemoglobin: 12.7 g/dL (ref 12.0–15.0)
MCH: 30.6 pg (ref 26.0–34.0)
MCHC: 32.9 g/dL (ref 30.0–36.0)
MCV: 93 fL (ref 78.0–100.0)
Platelets: 233 10*3/uL (ref 150–400)
RBC: 4.15 MIL/uL (ref 3.87–5.11)
RDW: 14.2 % (ref 11.5–15.5)
WBC: 8.9 10*3/uL (ref 4.0–10.5)

## 2018-01-12 LAB — D-DIMER, QUANTITATIVE: D-Dimer, Quant: 8.5 ug/mL-FEU — ABNORMAL HIGH (ref 0.00–0.50)

## 2018-01-12 LAB — COMPREHENSIVE METABOLIC PANEL
ALBUMIN: 3.1 g/dL — AB (ref 3.5–5.0)
ALT: 10 U/L — ABNORMAL LOW (ref 14–54)
AST: 14 U/L — AB (ref 15–41)
Alkaline Phosphatase: 62 U/L (ref 38–126)
Anion gap: 8 (ref 5–15)
BILIRUBIN TOTAL: 0.5 mg/dL (ref 0.3–1.2)
BUN: 20 mg/dL (ref 6–20)
CALCIUM: 9.1 mg/dL (ref 8.9–10.3)
CO2: 26 mmol/L (ref 22–32)
CREATININE: 1.45 mg/dL — AB (ref 0.44–1.00)
Chloride: 105 mmol/L (ref 101–111)
GFR calc Af Amer: 42 mL/min — ABNORMAL LOW (ref >60)
GFR calc non Af Amer: 36 mL/min — ABNORMAL LOW (ref >60)
GLUCOSE: 92 mg/dL (ref 65–99)
Potassium: 3.9 mmol/L (ref 3.5–5.1)
Sodium: 139 mmol/L (ref 135–145)
TOTAL PROTEIN: 6.4 g/dL — AB (ref 6.5–8.1)

## 2018-01-12 LAB — HIV ANTIBODY (ROUTINE TESTING W REFLEX): HIV SCREEN 4TH GENERATION: NONREACTIVE

## 2018-01-12 LAB — HEPARIN LEVEL (UNFRACTIONATED): Heparin Unfractionated: 0.42 IU/mL (ref 0.30–0.70)

## 2018-01-12 LAB — MAGNESIUM: Magnesium: 2 mg/dL (ref 1.7–2.4)

## 2018-01-12 MED ORDER — ESCITALOPRAM OXALATE 10 MG PO TABS
20.0000 mg | ORAL_TABLET | Freq: Every day | ORAL | Status: DC
  Administered 2018-01-13 – 2018-01-14 (×2): 20 mg via ORAL
  Filled 2018-01-12 (×2): qty 2

## 2018-01-12 MED ORDER — GABAPENTIN 300 MG PO CAPS
600.0000 mg | ORAL_CAPSULE | Freq: Two times a day (BID) | ORAL | Status: DC
  Administered 2018-01-12 – 2018-01-14 (×4): 600 mg via ORAL
  Filled 2018-01-12 (×6): qty 2

## 2018-01-12 NOTE — Progress Notes (Signed)
DAILY PROGRESS NOTE   Patient Name: Jennifer Jacobs Date of Encounter: 01/12/2018  Chief Complaint   No chest pain overnight  Patient Profile   72 yo female with multiple medical problems including etoh abuse, anxiety/depression, dyslipidemia, fibromyalgia and possible history of prior MI, presented to [facility] with chest pain and ruled-in for ACS with troponin of 0.3, trending down to 0.19. Transferred to [facility] for further care  Subjective   D-dimer does not look like it was performed. No chest pain overnight. Creatinine improving. Troponin flatly elevated - not as consistent with NSTEMI.  Echo yesterday showed LVEF 60-65%, however, there is pulmonary hypertension with RVSP of 57 mmHg and no wall motion abnormalities.  Objective   Vitals:   01/11/18 1603 01/11/18 1935 01/12/18 0320 01/12/18 0902  BP:  (!) 133/92 (!) 115/102 (!) 134/115  Pulse:  80 79   Resp:  14 (!) 21   Temp:  98.6 F (37 C) (!) 97.4 F (36.3 C)   TempSrc:  Oral Oral   SpO2:  91% 92%   Weight: 190 lb 12.8 oz (86.5 kg)  197 lb 11.2 oz (89.7 kg)   Height: '5\' 8"'  (1.727 m)       Intake/Output Summary (Last 24 hours) at 01/12/2018 1041 Last data filed at 01/12/2018 0600 Gross per 24 hour  Intake 241 ml  Output -  Net 241 ml   Filed Weights   01/10/18 2102 01/11/18 1603 01/12/18 0320  Weight: 206 lb (93.4 kg) 190 lb 12.8 oz (86.5 kg) 197 lb 11.2 oz (89.7 kg)    Physical Exam   General appearance: alert and no distress Neck: no carotid bruit, no JVD and thyroid not enlarged, symmetric, no tenderness/mass/nodules Lungs: clear to auscultation bilaterally Heart: regular rate and rhythm Abdomen: soft, non-tender; bowel sounds normal; no masses,  no organomegaly Extremities: extremities normal, atraumatic, no cyanosis or edema Pulses: 2+ and symmetric Skin: Skin color, texture, turgor normal. No rashes or lesions Neurologic: Grossly normal Psych: Pleasant  Inpatient Medications    Scheduled  Meds: . aspirin EC  81 mg Oral Daily  . escitalopram  20 mg Oral BID  . gabapentin  800 mg Oral TID  . lamoTRIgine  100 mg Oral BID  . levETIRAcetam  500 mg Oral BID  . metoprolol tartrate  25 mg Oral BID  . nicotine  7 mg Transdermal Daily  . oxybutynin  10 mg Oral QHS  . pneumococcal 23 valent vaccine  0.5 mL Intramuscular Tomorrow-1000  . risperiDONE  0.5 mg Oral QHS  . traZODone  50 mg Oral QHS    Continuous Infusions: . heparin 1,100 Units/hr (01/11/18 0531)    PRN Meds: acetaminophen **OR** acetaminophen, hydrALAZINE, nitroGLYCERIN, ondansetron **OR** ondansetron (ZOFRAN) IV   Labs   Results for orders placed or performed during the hospital encounter of 01/10/18 (from the past 48 hour(s))  Basic metabolic panel     Status: Abnormal   Collection Time: 01/10/18  9:44 PM  Result Value Ref Range   Sodium 143 135 - 145 mmol/L   Potassium 4.2 3.5 - 5.1 mmol/L   Chloride 105 101 - 111 mmol/L   CO2 29 22 - 32 mmol/L   Glucose, Bld 120 (H) 65 - 99 mg/dL   BUN 19 6 - 20 mg/dL   Creatinine, Ser 1.70 (H) 0.44 - 1.00 mg/dL   Calcium 10.0 8.9 - 10.3 mg/dL   GFR calc non Af Amer 30 (L) >60 mL/min   GFR calc Af  Amer 34 (L) >60 mL/min    Comment: (NOTE) The eGFR has been calculated using the CKD EPI equation. This calculation has not been validated in all clinical situations. eGFR's persistently <60 mL/min signify possible Chronic Kidney Disease.    Anion gap 9 5 - 15    Comment: Performed at [facility], [address], [place], [place] [address]  CBC     Status: None   Collection Time: 01/10/18  9:44 PM  Result Value Ref Range   WBC 9.4 4.0 - 10.5 K/uL   RBC 4.43 3.87 - 5.11 MIL/uL   Hemoglobin 13.7 12.0 - 15.0 g/dL   HCT 42.4 36.0 - 46.0 %   MCV 95.7 78.0 - 100.0 fL   MCH 30.9 26.0 - 34.0 pg   MCHC 32.3 30.0 - 36.0 g/dL   RDW 14.6 11.5 - 15.5 %   Platelets 262 150 - 400 K/uL    Comment: Performed at [facility], [address], [place], [place] [address]  Troponin I      Status: Abnormal   Collection Time: 01/10/18  9:44 PM  Result Value Ref Range   Troponin I 0.32 (HH) <0.03 ng/mL    Comment: CRITICAL RESULT CALLED TO, READ BACK BY AND VERIFIED WITH: MYRICK,B @ 3382 ON 01/10/18 BY JUW Performed at [facility], [address] [place], [place] [address]   HIV antibody (Routine Testing)     Status: None   Collection Time: 01/11/18  1:41 AM  Result Value Ref Range   HIV Screen 4th Generation wRfx Non Reactive Non Reactive    Comment: (NOTE) Performed At: [facility] [place], [place] [id number] Rush Farmer MD [id number] Performed at [facility], [address], [place], [place] [address]   Troponin I     Status: Abnormal   Collection Time: 01/11/18  1:41 AM  Result Value Ref Range   Troponin I 0.29 (HH) <0.03 ng/mL    Comment: CRITICAL VALUE NOTED.  VALUE IS CONSISTENT WITH PREVIOUSLY REPORTED AND CALLED VALUE. Performed at [facility], [address] [place], [place] [address]   Basic metabolic panel     Status: Abnormal   Collection Time: 01/11/18  2:01 AM  Result Value Ref Range   Sodium 140 135 - 145 mmol/L   Potassium 4.5 3.5 - 5.1 mmol/L   Chloride 103 101 - 111 mmol/L   CO2 31 22 - 32 mmol/L   Glucose, Bld 103 (H) 65 - 99 mg/dL   BUN 21 (H) 6 - 20 mg/dL   Creatinine, Ser 1.69 (H) 0.44 - 1.00 mg/dL   Calcium 9.5 8.9 - 10.3 mg/dL   GFR calc non Af Amer 30 (L) >60 mL/min   GFR calc Af Amer 35 (L) >60 mL/min    Comment: (NOTE) The eGFR has been calculated using the CKD EPI equation. This calculation has not been validated in all clinical situations. eGFR's persistently <60 mL/min signify possible Chronic Kidney Disease.    Anion gap 6 5 - 15    Comment: Performed at [facility], [address], [place], [place] [address]  CBC     Status: Abnormal   Collection Time: 01/11/18  2:01 AM  Result Value Ref Range   WBC 10.6 (H) 4.0 - 10.5 K/uL   RBC 4.15 3.87 - 5.11 MIL/uL   Hemoglobin 12.8 12.0 - 15.0  g/dL   HCT 39.6 36.0 - 46.0 %   MCV 95.4 78.0 - 100.0 fL   MCH 30.8 26.0 -  34.0 pg   MCHC 32.3 30.0 - 36.0 g/dL   RDW 14.6 11.5 - 15.5 %   Platelets 227 150 - 400 K/uL    Comment: Performed at [facility], [address] [place], [place] [address]  APTT     Status: None   Collection Time: 01/11/18  2:01 AM  Result Value Ref Range   aPTT 29 24 - 36 seconds    Comment: Performed at [facility], [address] [place], [place] [address]  Protime-INR     Status: None   Collection Time: 01/11/18  2:01 AM  Result Value Ref Range   Prothrombin Time 13.1 11.4 - 15.2 seconds   INR 1.00     Comment: Performed at [facility], [address] [place], [place] [address]  Troponin I     Status: Abnormal   Collection Time: 01/11/18  7:42 AM  Result Value Ref Range   Troponin I 0.19 (HH) <0.03 ng/mL    Comment: CRITICAL RESULT CALLED TO, READ BACK BY AND VERIFIED WITH: MANDY @ 4656 ON [id number] BY HENDERSON L. Performed at [facility], [address] [place], [place] [address]   Heparin level (unfractionated)     Status: None   Collection Time: 01/11/18  9:00 AM  Result Value Ref Range   Heparin Unfractionated 0.40 0.30 - 0.70 IU/mL    Comment: (NOTE) If heparin results are below expected values, and patient dosage has  been confirmed, suggest follow up testing of antithrombin III levels. Performed at [facility], [address], [place], [place] [address]   Troponin I     Status: Abnormal   Collection Time: 01/11/18 12:37 PM  Result Value Ref Range   Troponin I 0.24 (HH) <0.03 ng/mL    Comment: CRITICAL RESULT CALLED TO, READ BACK BY AND VERIFIED WITH: MANDY @ 1322 ON [id number] BY HENDERSON L. Performed at [facility], [address], [place], [place] [address]   Heparin level (unfractionated)     Status: None   Collection Time: 01/12/18  3:52 AM  Result Value Ref Range   Heparin Unfractionated 0.42 0.30 - 0.70 IU/mL    Comment: (NOTE) If heparin results are below expected values,  and patient dosage has  been confirmed, suggest follow up testing of antithrombin III levels. Performed at [facility], [place] [address], [place], [place] [address]   CBC     Status: None   Collection Time: 01/12/18  3:52 AM  Result Value Ref Range   WBC 8.9 4.0 - 10.5 K/uL   RBC 4.15 3.87 - 5.11 MIL/uL   Hemoglobin 12.7 12.0 - 15.0 g/dL   HCT 38.6 36.0 - 46.0 %   MCV 93.0 78.0 - 100.0 fL   MCH 30.6 26.0 - 34.0 pg   MCHC 32.9 30.0 - 36.0 g/dL   RDW 14.2 11.5 - 15.5 %   Platelets 233 150 - 400 K/uL    Comment: Performed at [facility], [place] [address], [place], [place] [address]  Comprehensive metabolic panel     Status: Abnormal   Collection Time: 01/12/18  3:52 AM  Result Value Ref Range   Sodium 139 135 - 145 mmol/L   Potassium 3.9 3.5 - 5.1 mmol/L   Chloride 105 101 - 111 mmol/L   CO2 26 22 - 32 mmol/L   Glucose, Bld 92 65 - 99 mg/dL   BUN 20 6 - 20 mg/dL   Creatinine, Ser 1.45 (H) 0.44 - 1.00 mg/dL  Calcium 9.1 8.9 - 10.3 mg/dL   Total Protein 6.4 (L) 6.5 - 8.1 g/dL   Albumin 3.1 (L) 3.5 - 5.0 g/dL   AST 14 (L) 15 - 41 U/L   ALT 10 (L) 14 - 54 U/L   Alkaline Phosphatase 62 38 - 126 U/L   Total Bilirubin 0.5 0.3 - 1.2 mg/dL   GFR calc non Af Amer 36 (L) >60 mL/min   GFR calc Af Amer 42 (L) >60 mL/min    Comment: (NOTE) The eGFR has been calculated using the CKD EPI equation. This calculation has not been validated in all clinical situations. eGFR's persistently <60 mL/min signify possible Chronic Kidney Disease.    Anion gap 8 5 - 15    Comment: Performed at [facility] [address] [place], [place] [address]  Magnesium     Status: None   Collection Time: 01/12/18  3:52 AM  Result Value Ref Range   Magnesium 2.0 1.7 - 2.4 mg/dL    Comment: Performed at [facility] [address], [place], [place] [address]    ECG   N/A  Telemetry   Sinus rhythm - Personally Reviewed  Radiology    Dg Chest 2 View  Result Date:  01/10/2018 CLINICAL DATA:  Shortness of breath EXAM: CHEST - 2 VIEW COMPARISON:  02/06/2016 FINDINGS: Mild bronchitic changes. No acute opacity or pleural effusion. Normal heart size. Aortic atherosclerosis. No pneumothorax. IMPRESSION: No active cardiopulmonary disease. Electronically Signed   By: Donavan Foil M.D.   On: 01/10/2018 23:48    Cardiac Studies   N/A  Assessment   1. Principal Problem: 2.   Chest pain 3. Active Problems: 4.   Smoker unmotivated to quit 5.   Major depression 6.   Fibromyalgia 7.   Benign essential HTN 8.   Seizure disorder (HCC) 9.   Elevated troponin 10.   AKI (acute kidney injury) ([place]) 11.   NSTEMI (non-ST elevated myocardial infarction) ([place]) 12.   Plan   1. Check d-dimer today - if elevated, would get CT pulmonary angiogram to r/o PE. On IV heparin. Echo reassuring except for pulmonary hypertension. If pulmonary work-up negative, then would pursue lexiscan myoview to evaluate flat elevated troponin in the am tomorrow (she ate this am).  Time Spent Directly with Patient:  I have spent a total of 25 minutes with the patient reviewing hospital notes, telemetry, EKGs, labs and examining the patient as well as establishing an assessment and plan that was discussed personally with the patient. > 50% of time was spent in direct patient care.  Length of Stay:  LOS: 1 day   Pixie Casino, MD, [facility], [place] Director of the Advanced Lipid Disorders &  Cardiovascular Risk Reduction Clinic Diplomate of the American Board of Clinical Lipidology Attending Cardiologist  Direct Dial: [phone]  Fax: [phone]  Website:  [email or web address] 01/12/2018, 10:41 AM

## 2018-01-12 NOTE — Progress Notes (Signed)
Patient Demographics:    Jennifer Jacobs, is a 72 y.o. female, DOB - 1946/07/26, ZOX:[id number]  Admit date - 01/10/2018   Admitting Physician Pratik Hoover Brunette, DO  Outpatient Primary MD for the patient is Patient, No Pcp Per  LOS - 1   Chief Complaint  Patient presents with  . Shortness of Breath        Subjective:    Jennifer Jacobs today has no fevers, no emesis,  No chest pain, right-sided chest pain is resolved, no significant shortness of breath at this time  Assessment  & Plan :    Principal Problem:   Chest pain Active Problems:   Smoker unmotivated to quit   Major depression   Fibromyalgia   Benign essential HTN   Seizure disorder (HCC)   Elevated troponin   AKI (acute kidney injury) (HCC)   NSTEMI (non-ST elevated myocardial infarction) [facility])  Brief summary:- 72 yo female with multiple medical problems including etoh abuse, anxiety/depression, dyslipidemia, fibromyalgia and possible history of prior MI, admitted on 01/11/2018 to [facility] with chest pain and ruled-in for ACS with troponin of 0.3, trending down to 0.19. Transferred on 01/11/2018 to [facility] for further care   PlaN 1)Atypical CP/mostly Right-sided/Elevated troponin--patient is now chest pain-free, cardiology consult appreciated, echocardiogram with preserved EF over 60% grade 1 diastolic dysfunction,, no wall motion abnormalities, however patient does have pulmonary hypertension, if PE work-up is negative we will get nuclear stress test.  Continue aspirin and metoprolol  2)AKI----acute kidney injury  -    suspect prerenal etiology, creatinine is improving creatinine on admission= 1.70 ,   baseline creatinine = 0.98    , creatinine is now= 1.45     ,  Avoid nephrotoxic agents/dehydration/hypotension  3)Tobacco Abuse-  smoking cessation counseling for 4 minutes today, use nicotine patch  4)Atypical chest pains with elevated  d-dimer-get venous Dopplers, continue IV heparin, will get CTA chest if renal function improves further otherwise get VQ scan in a.m.  5)HTN-improved BP control, continue metoprolol 25 mg twice daily,   may use IV Hydralazine 10 mg  Every 4 hours Prn for systolic blood pressure over 160 mmhg  6)Sz DO-stable, no seizures, continue Lamictal, Keppra, as well as gabapentin but reduce gabapentin to 600 mg twice daily due to AKI  7)Depression/Anxiety--- stable, continue trazodone 50 mg nightly, risperidone 0.5 mg nightly, change Lexapro 20 mg to daily dosing from twice daily   Code Status : full   Disposition Plan  : home possibly with home health services to improve compliance  Consults  : Cardiology   DVT Prophylaxis  : IV heparin Lab Results  Component Value Date   PLT 233 01/12/2018    Inpatient Medications  Scheduled Meds: . aspirin EC  81 mg Oral Daily  . escitalopram  20 mg Oral BID  . gabapentin  600 mg Oral BID  . lamoTRIgine  100 mg Oral BID  . levETIRAcetam  500 mg Oral BID  . metoprolol tartrate  25 mg Oral BID  . nicotine  7 mg Transdermal Daily  . oxybutynin  10 mg Oral QHS  . risperiDONE  0.5 mg Oral QHS  . traZODone  50 mg Oral QHS   Continuous Infusions: . heparin 1,100 Units/hr (01/11/18  76)   PRN Meds:.acetaminophen **OR** acetaminophen, hydrALAZINE, nitroGLYCERIN, ondansetron **OR** ondansetron (ZOFRAN) IV    Anti-infectives (From admission, onward)   None        Objective:   Vitals:   01/11/18 1935 01/12/18 0320 01/12/18 0902 01/12/18 1335  BP: (!) 133/92 (!) 115/102 (!) 134/115 (!) 144/105  Pulse: 80 79  65  Resp: 14 (!) 21  14  Temp: 98.6 F (37 C) (!) 97.4 F (36.3 C)  98.4 F (36.9 C)  TempSrc: Oral Oral  Oral  SpO2: 91% 92%  100%  Weight:  89.7 kg (197 lb 11.2 oz)    Height:        Wt Readings from Last 3 Encounters:  01/12/18 89.7 kg (197 lb 11.2 oz)  12/10/17 92.1 kg (203 lb 1.3 oz)  11/14/17 90.7 kg (200 lb)      Intake/Output Summary (Last 24 hours) at 01/12/2018 1633 Last data filed at 01/12/2018 1338 Gross per 24 hour  Intake 721 ml  Output -  Net 721 ml     Physical Exam  Gen:- Awake Alert, no acute distress HEENT:- [place].AT, No sclera icterus Neck-Supple Neck,No JVD,.  Lungs-  CTAB , good air movement CV- S1, S2 normal Abd-  +ve B.Sounds, Abd Soft, No tenderness,    Extremity/Skin:-Warm and dry   good pulses Psych-affect is appropriate, oriented x3 Neuro-no new focal deficits, no tremors   Data Review:   Micro Results No results found for this or any previous visit (from the past 240 hour(s)).  Radiology Reports Dg Chest 2 View  Result Date: 01/10/2018 CLINICAL DATA:  Shortness of breath EXAM: CHEST - 2 VIEW COMPARISON:  02/06/2016 FINDINGS: Mild bronchitic changes. No acute opacity or pleural effusion. Normal heart size. Aortic atherosclerosis. No pneumothorax. IMPRESSION: No active cardiopulmonary disease. Electronically Signed   By: Jasmine Pang M.D.   On: 01/10/2018 23:48     CBC Recent Labs  Lab 01/10/18 2144 01/11/18 0201 01/12/18 0352  WBC 9.4 10.6* 8.9  HGB 13.7 12.8 12.7  HCT 42.4 39.6 38.6  PLT 262 227 233  MCV 95.7 95.4 93.0  MCH 30.9 30.8 30.6  MCHC 32.3 32.3 32.9  RDW 14.6 14.6 14.2    Chemistries  Recent Labs  Lab 01/10/18 2144 01/11/18 0201 01/12/18 0352  NA 143 140 139  K 4.2 4.5 3.9  CL 105 103 105  CO2 GLUCOSE 120* 103* 92  BUN 19 21* 20  CREATININE 1.70* 1.69* 1.45*  CALCIUM 10.0 9.5 9.1  MG  --   --  2.0  AST  --   --  14*  ALT  --   --  10*  ALKPHOS  --   --  62  BILITOT  --   --  0.5   ------------------------------------------------------------------------------------------------------------------ No results for input(s): CHOL, HDL, LDLCALC, TRIG, CHOLHDL, LDLDIRECT in the last 72 hours.  No results found for:  HGBA1C ------------------------------------------------------------------------------------------------------------------ No results for input(s): TSH, T4TOTAL, T3FREE, THYROIDAB in the last 72 hours.  Invalid input(s): FREET3 ------------------------------------------------------------------------------------------------------------------ No results for input(s): VITAMINB12, FOLATE, FERRITIN, TIBC, IRON, RETICCTPCT in the last 72 hours.  Coagulation profile Recent Labs  Lab 01/11/18 0201  INR 1.00    Recent Labs    01/12/18 1057  DDIMER 8.50*    Cardiac Enzymes Recent Labs  Lab 01/11/18 0141 01/11/18 0742 01/11/18 1237  TROPONINI 0.29* 0.19* 0.24*   ------------------------------------------------------------------------------------------------------------------ No results found for: BNP   Shon Hale M.D on 01/12/2018 at 4:33 PM  Between 7am to 7pm - Pager - [phone]  After 7pm go to [email or web address] - password [email or web address]  [facility] -  Office  [phone]   Voice Recognition Viviann Spare dictation system was used to create this note, attempts have been made to correct errors. Please contact the author with questions and/or clarifications.

## 2018-01-12 NOTE — Progress Notes (Signed)
  ANTICOAGULATION CONSULT NOTE  Pharmacy Consult for heparin Indication: chest pain/ACS  Allergies  Allergen Reactions  . Prozac [Fluoxetine Hcl] Other (See Comments)    Headaches   Patient Measurements: Height:  (172.7 cm) Weight: 197 lb 11.2 oz (89.7 kg) IBW/kg (Calculated) : 63.9 Heparin Dosing Weight: 85 kg  Vital Signs: Temp: 97.4 F (36.3 C) (05/26 0320) Temp Source: Oral (05/26 0320) BP: 134/115 (05/26 0902) Pulse Rate: 79 (05/26 0320)  Labs: Recent Labs    01/10/18 2144 01/11/18 0141 01/11/18 0201 01/11/18 0742 01/11/18 0900 01/11/18 1237 01/12/18 0352  HGB 13.7  --  12.8  --   --   --  12.7  HCT 42.4  --  39.6  --   --   --  38.6  PLT 262  --  227  --   --   --  233  APTT  --   --  29  --   --   --   --   LABPROT  --   --  13.1  --   --   --   --   INR  --   --  1.00  --   --   --   --   HEPARINUNFRC  --   --   --   --  0.40  --  0.42  CREATININE 1.70*  --  1.69*  --   --   --  1.45*  TROPONINI 0.32* 0.29*  --  0.19*  --  0.24*  --    Estimated Creatinine Clearance: 42.9 mL/min (A) (by C-G formula based on SCr of 1.45 mg/dL (H)).  Infusions:  . heparin 1,100 Units/hr (01/11/18 0531)   PRN: acetaminophen **OR** acetaminophen, hydrALAZINE, nitroGLYCERIN, ondansetron **OR** ondansetron (ZOFRAN) IV  Assessment: 72 yo female came to ED c/o chest pain and SOB.  EKG with normal sinus rhythm and incomplete left bundle branch block similar to prior EKG on 12/10/17.  Transferred from [facility] with heparin infusing for cardiac cath (possibly 5/28).  Heparin level remains therapeutic: 0.42, CBC stable no overt bleeding or infusion issues reported.   Goal of Therapy:  Heparin level 0.3-0.7 units/ml Monitor platelets by anticoagulation protocol: Yes   Plan:  Continue heparin gtt at 1100 units/hr Daily Heparin Level and CBC Monitor for signs and symptoms of bleeding  Ulah Olmo L Arneisha Kincannon 01/12/2018,10:51 AM

## 2018-01-12 NOTE — Progress Notes (Signed)
I called results of D Dimer to Dr. Mariea Clonts and plan is to check Cr fx in am to decide which way to go Ct versus VQ. Pt on heparin drip as ordered.

## 2018-01-13 ENCOUNTER — Encounter (HOSPITAL_COMMUNITY): Payer: Self-pay | Admitting: Radiology

## 2018-01-13 ENCOUNTER — Inpatient Hospital Stay (HOSPITAL_COMMUNITY): Payer: Medicare HMO

## 2018-01-13 DIAGNOSIS — R609 Edema, unspecified: Secondary | ICD-10-CM

## 2018-01-13 DIAGNOSIS — R079 Chest pain, unspecified: Secondary | ICD-10-CM

## 2018-01-13 DIAGNOSIS — I2699 Other pulmonary embolism without acute cor pulmonale: Principal | ICD-10-CM

## 2018-01-13 DIAGNOSIS — M7989 Other specified soft tissue disorders: Secondary | ICD-10-CM

## 2018-01-13 LAB — COMPREHENSIVE METABOLIC PANEL
ALK PHOS: 62 U/L (ref 38–126)
ALT: 10 U/L — ABNORMAL LOW (ref 14–54)
ANION GAP: 8 (ref 5–15)
AST: 14 U/L — AB (ref 15–41)
Albumin: 3.4 g/dL — ABNORMAL LOW (ref 3.5–5.0)
BILIRUBIN TOTAL: 0.5 mg/dL (ref 0.3–1.2)
BUN: 10 mg/dL (ref 6–20)
CALCIUM: 9.6 mg/dL (ref 8.9–10.3)
CO2: 26 mmol/L (ref 22–32)
Chloride: 109 mmol/L (ref 101–111)
Creatinine, Ser: 1.27 mg/dL — ABNORMAL HIGH (ref 0.44–1.00)
GFR calc Af Amer: 49 mL/min — ABNORMAL LOW (ref >60)
GFR calc non Af Amer: 42 mL/min — ABNORMAL LOW (ref >60)
Glucose, Bld: 96 mg/dL (ref 65–99)
Potassium: 3.9 mmol/L (ref 3.5–5.1)
SODIUM: 143 mmol/L (ref 135–145)
TOTAL PROTEIN: 6.6 g/dL (ref 6.5–8.1)

## 2018-01-13 LAB — MAGNESIUM: Magnesium: 1.9 mg/dL (ref 1.7–2.4)

## 2018-01-13 LAB — CBC
HEMATOCRIT: 38.3 % (ref 36.0–46.0)
HEMOGLOBIN: 12.7 g/dL (ref 12.0–15.0)
MCH: 30.6 pg (ref 26.0–34.0)
MCHC: 33.2 g/dL (ref 30.0–36.0)
MCV: 92.3 fL (ref 78.0–100.0)
Platelets: 218 10*3/uL (ref 150–400)
RBC: 4.15 MIL/uL (ref 3.87–5.11)
RDW: 14.2 % (ref 11.5–15.5)
WBC: 10.2 10*3/uL (ref 4.0–10.5)

## 2018-01-13 LAB — NM MYOCAR MULTI W/SPECT W/WALL MOTION / EF
CHL CUP MPHR: 151 {beats}/min
Estimated workload: 1 METS
Peak HR: 98 {beats}/min
Percent HR: 64 %
Rest HR: 63 {beats}/min

## 2018-01-13 LAB — HEPARIN LEVEL (UNFRACTIONATED): HEPARIN UNFRACTIONATED: 0.56 [IU]/mL (ref 0.30–0.70)

## 2018-01-13 MED ORDER — TECHNETIUM TC 99M TETROFOSMIN IV KIT
10.0000 | PACK | Freq: Once | INTRAVENOUS | Status: AC | PRN
  Administered 2018-01-13: 10 via INTRAVENOUS

## 2018-01-13 MED ORDER — TECHNETIUM TC 99M TETROFOSMIN IV KIT
30.0000 | PACK | Freq: Once | INTRAVENOUS | Status: AC | PRN
  Administered 2018-01-13: 30 via INTRAVENOUS

## 2018-01-13 MED ORDER — IOPAMIDOL (ISOVUE-370) INJECTION 76%
100.0000 mL | Freq: Once | INTRAVENOUS | Status: AC | PRN
  Administered 2018-01-13: 100 mL via INTRAVENOUS

## 2018-01-13 MED ORDER — REGADENOSON 0.4 MG/5ML IV SOLN
INTRAVENOUS | Status: AC
  Administered 2018-01-13: 0.4 mg via INTRAVENOUS
  Filled 2018-01-13: qty 5

## 2018-01-13 MED ORDER — IOPAMIDOL (ISOVUE-370) INJECTION 76%
INTRAVENOUS | Status: AC
  Filled 2018-01-13: qty 100

## 2018-01-13 MED ORDER — REGADENOSON 0.4 MG/5ML IV SOLN
0.4000 mg | Freq: Once | INTRAVENOUS | Status: AC
  Administered 2018-01-13: 0.4 mg via INTRAVENOUS
  Filled 2018-01-13: qty 5

## 2018-01-13 NOTE — Progress Notes (Signed)
1 day stress test completed, pending final report this afternoon by [facility] reader  Signed, Azalee Course PA Pager: [phone]

## 2018-01-13 NOTE — Progress Notes (Signed)
Preliminary notes--Bilateral lower extremities venous duplex exam completed. Negative for DVT.   Jennifer Jacobs (RDMS RVT) 01/13/18 4:00 PM

## 2018-01-13 NOTE — Progress Notes (Signed)
DAILY PROGRESS NOTE   Patient Name: Jennifer Jacobs Date of Encounter: 01/13/2018  Chief Complaint   No chest pain. See Dr Lysbeth Penner note. Not clear why CTA not ordered with elevated d dimer And pulmonary HTN on TTE. She is scheduled for myovue   Patient Profile   72 yo female with multiple medical problems including etoh abuse, anxiety/depression, dyslipidemia, fibromyalgia and possible history of prior MI, presented to [facility] with chest pain and ruled-in for ACS with troponin of 0.3, trending down to 0.19. Transferred to [facility] for further care  Subjective   Troponin flatly elevated - not as consistent with NSTEMI.  Echo yesterday showed LVEF 60-65%, however, there is pulmonary hypertension with RVSP of 57 mmHg and no wall motion abnormalities.  Objective   Vitals:   01/12/18 1335 01/12/18 2029 01/12/18 2210 01/13/18 0316  BP: (!) 144/105 (!) 163/83 (!) 141/106 (!) 166/105  Pulse: 65 69 80 78  Resp: '14 20  19  ' Temp: 98.4 F (36.9 C) 99.3 F (37.4 C)  98.8 F (37.1 C)  TempSrc: Oral Oral  Oral  SpO2: 100% 99%  99%  Weight:    196 lb 8 oz (89.1 kg)  Height:        Intake/Output Summary (Last 24 hours) at 01/13/2018 0705 Last data filed at 01/13/2018 0600 Gross per 24 hour  Intake 841 ml  Output 1000 ml  Net -159 ml   Filed Weights   01/11/18 1603 01/12/18 0320 01/13/18 0316  Weight: 190 lb 12.8 oz (86.5 kg) 197 lb 11.2 oz (89.7 kg) 196 lb 8 oz (89.1 kg)    Physical Exam   Affect appropriate Black female in no distress  HEENT: normal Neck supple with no adenopathy JVP normal no bruits no thyromegaly Lungs clear with no wheezing and good diaphragmatic motion Heart:  S1/S2 no murmur, no rub, gallop or click PMI normal Abdomen: benighn, BS positve, no tenderness, no AAA no bruit.  No HSM or HJR Distal pulses intact with no bruits No edema Neuro non-focal Skin warm and dry No muscular weakness   Inpatient Medications    Scheduled Meds: . aspirin EC  81  mg Oral Daily  . escitalopram  20 mg Oral Daily  . gabapentin  600 mg Oral BID  . lamoTRIgine  100 mg Oral BID  . levETIRAcetam  500 mg Oral BID  . metoprolol tartrate  25 mg Oral BID  . nicotine  7 mg Transdermal Daily  . oxybutynin  10 mg Oral QHS  . risperiDONE  0.5 mg Oral QHS  . traZODone  50 mg Oral QHS    Continuous Infusions: . heparin 1,100 Units/hr (01/12/18 2019)    PRN Meds: acetaminophen **OR** acetaminophen, hydrALAZINE, nitroGLYCERIN, ondansetron **OR** ondansetron (ZOFRAN) IV   Labs   Results for orders placed or performed during the hospital encounter of 01/10/18 (from the past 48 hour(s))  Troponin I     Status: Abnormal   Collection Time: 01/11/18  7:42 AM  Result Value Ref Range   Troponin I 0.19 (HH) <0.03 ng/mL    Comment: CRITICAL RESULT CALLED TO, READ BACK BY AND VERIFIED WITH: MANDY @ 2751 ON [id number] BY HENDERSON L. Performed at [facility], [address] [place],  [address]   Heparin level (unfractionated)     Status: None   Collection Time: 01/11/18  9:00 AM  Result Value Ref Range   Heparin Unfractionated 0.40 0.30 - 0.70 IU/mL    Comment: (NOTE) If heparin  results are below expected values, and patient dosage has  been confirmed, suggest follow up testing of antithrombin III levels. Performed at [facility], [address] [place], [place] [address]   Troponin I     Status: Abnormal   Collection Time: 01/11/18 12:37 PM  Result Value Ref Range   Troponin I 0.24 (HH) <0.03 ng/mL    Comment: CRITICAL RESULT CALLED TO, READ BACK BY AND VERIFIED WITH: MANDY @ 1322 ON [id number] BY HENDERSON L. Performed at [facility], [address], [place], [place] [address]   Heparin level (unfractionated)     Status: None   Collection Time: 01/12/18  3:52 AM  Result Value Ref Range   Heparin Unfractionated 0.42 0.30 - 0.70 IU/mL    Comment: (NOTE) If heparin results are below expected values, and patient dosage has  been confirmed, suggest  follow up testing of antithrombin III levels. Performed at [facility], [place] [address], [place], [place] [address]   CBC     Status: None   Collection Time: 01/12/18  3:52 AM  Result Value Ref Range   WBC 8.9 4.0 - 10.5 K/uL   RBC 4.15 3.87 - 5.11 MIL/uL   Hemoglobin 12.7 12.0 - 15.0 g/dL   HCT 38.6 36.0 - 46.0 %   MCV 93.0 78.0 - 100.0 fL   MCH 30.6 26.0 - 34.0 pg   MCHC 32.9 30.0 - 36.0 g/dL   RDW 14.2 11.5 - 15.5 %   Platelets 233 150 - 400 K/uL    Comment: Performed at [facility], [place] [address] [place], [place] [address]  Comprehensive metabolic panel     Status: Abnormal   Collection Time: 01/12/18  3:52 AM  Result Value Ref Range   Sodium 139 135 - 145 mmol/L   Potassium 3.9 3.5 - 5.1 mmol/L   Chloride 105 101 - 111 mmol/L   CO2 26 22 - 32 mmol/L   Glucose, Bld 92 65 - 99 mg/dL   BUN 20 6 - 20 mg/dL   Creatinine, Ser 1.45 (H) 0.44 - 1.00 mg/dL   Calcium 9.1 8.9 - 10.3 mg/dL   Total Protein 6.4 (L) 6.5 - 8.1 g/dL   Albumin 3.1 (L) 3.5 - 5.0 g/dL   AST 14 (L) 15 - 41 U/L   ALT 10 (L) 14 - 54 U/L   Alkaline Phosphatase 62 38 - 126 U/L   Total Bilirubin 0.5 0.3 - 1.2 mg/dL   GFR calc non Af Amer 36 (L) >60 mL/min   GFR calc Af Amer 42 (L) >60 mL/min    Comment: (NOTE) The eGFR has been calculated using the CKD EPI equation. This calculation has not been validated in all clinical situations. eGFR's persistently <60 mL/min signify possible Chronic Kidney Disease.    Anion gap 8 5 - 15    Comment: Performed at [facility] [address] [place], [place] [address]  Magnesium     Status: None   Collection Time: 01/12/18  3:52 AM  Result Value Ref Range   Magnesium 2.0 1.7 - 2.4 mg/dL    Comment: Performed at [facility] [address] [place], [place] [address]  D-dimer, quantitative (not at [facility])     Status: Abnormal   Collection Time: 01/12/18 10:57 AM  Result Value Ref Range   D-Dimer, Quant 8.50 (H) 0.00 - 0.50 ug/mL-FEU     Comment: (NOTE) At the manufacturer cut-off of 0.50 ug/mL FEU, this  assay has been documented to exclude PE with a sensitivity and negative predictive value of 97 to 99%.  At this time, this assay has not been approved by the FDA to exclude DVT/VTE. Results should be correlated with clinical presentation. Performed at [facility], [place] [address], [place], [place] [address]   Heparin level (unfractionated)     Status: None   Collection Time: 01/13/18  3:48 AM  Result Value Ref Range   Heparin Unfractionated 0.56 0.30 - 0.70 IU/mL    Comment: (NOTE) If heparin results are below expected values, and patient dosage has  been confirmed, suggest follow up testing of antithrombin III levels. Performed at [facility], [place] [address] [place] [address]   CBC     Status: None   Collection Time: 01/13/18  3:48 AM  Result Value Ref Range   WBC 10.2 4.0 - 10.5 K/uL   RBC 4.15 3.87 - 5.11 MIL/uL   Hemoglobin 12.7 12.0 - 15.0 g/dL   HCT 38.3 36.0 - 46.0 %   MCV 92.3 78.0 - 100.0 fL   MCH 30.6 26.0 - 34.0 pg   MCHC 33.2 30.0 - 36.0 g/dL   RDW 14.2 11.5 - 15.5 %   Platelets 218 150 - 400 K/uL    Comment: Performed at [facility], [place] [address], [place], [place] [address]  Comprehensive metabolic panel     Status: Abnormal   Collection Time: 01/13/18  3:48 AM  Result Value Ref Range   Sodium 143 135 - 145 mmol/L   Potassium 3.9 3.5 - 5.1 mmol/L   Chloride 109 101 - 111 mmol/L   CO2 26 22 - 32 mmol/L   Glucose, Bld 96 65 - 99 mg/dL   BUN 10 6 - 20 mg/dL   Creatinine, Ser 1.27 (H) 0.44 - 1.00 mg/dL   Calcium 9.6 8.9 - 10.3 mg/dL   Total Protein 6.6 6.5 - 8.1 g/dL   Albumin 3.4 (L) 3.5 - 5.0 g/dL   AST 14 (L) 15 - 41 U/L   ALT 10 (L) 14 - 54 U/L   Alkaline Phosphatase 62 38 - 126 U/L   Total Bilirubin 0.5 0.3 - 1.2 mg/dL   GFR calc non Af Amer 42 (L) >60 mL/min   GFR calc Af Amer 49 (L) >60 mL/min    Comment: (NOTE) The eGFR has been calculated using the  CKD EPI equation. This calculation has not been validated in all clinical situations. eGFR's persistently <60 mL/min signify possible Chronic Kidney Disease.    Anion gap 8 5 - 15    Comment: Performed at [facility] [address], [place], [place] [address]  Magnesium     Status: None   Collection Time: 01/13/18  3:48 AM  Result Value Ref Range   Magnesium 1.9 1.7 - 2.4 mg/dL    Comment: Performed at [facility] [address], [place],  [address]    ECG   N/A  Telemetry   Sinus rhythm - Personally Reviewed  Radiology    No results found.  Cardiac Studies   N/A  Assessment   Principal Problem:   Chest pain Active Problems:   Smoker unmotivated to quit   Major depression   Fibromyalgia   Benign essential HTN   Seizure disorder (HCC)   Elevated troponin   AKI (acute kidney injury) ([place])   NSTEMI (non-ST elevated myocardial infarction) [facility])   Plan   Chest  Pain:  She is scheduled for nuclear study to r/o CAD. Troponin elevation flat ECG with anterolateral T wave changes No pain now on heparin. Ok to proceed with lexiscan but if negative would still pursue CTA to r/o PE Given pulmonary HTN on echo.    Smoking discussed cessation not motivated to quit  HTN:  Add norvasc for better BP contorl    Time Spent Directly with Patient:  35 minutes with review of notes, labs ECG;s TTE and CXR   Jenkins Rouge   01/13/2018, 7:05 AM

## 2018-01-13 NOTE — Progress Notes (Signed)
Patient Demographics:    Jennifer Jacobs, is a 72 y.o. female, DOB - 24-Dec-1945, ZOX:[id number]  Admit date - 01/10/2018   Admitting Physician Pratik Hoover Brunette, DO  Outpatient Primary MD for the patient is Patient, No Pcp Per  LOS - 2  Chief Complaint  Patient presents with  . Shortness of Breath        Subjective:    Jennifer Jacobs today has no fevers, no emesis,  No chest pain, right-sided chest pain is resolved, had shortness of breath last evening, none today, no productive cough or hemoptysis, denies leg pains or leg swelling  Assessment  & Plan :    Principal Problem:   Acute pulmonary embolism (HCC) Active Problems:   Smoker unmotivated to quit   Major depression   Fibromyalgia   Benign essential HTN   Seizure disorder (HCC)   Chest pain   Elevated troponin   AKI (acute kidney injury) (HCC)   NSTEMI (non-ST elevated myocardial infarction) (HCC)  CTA Chest 01/13/18 IMPRESSION: The study is positive for bilateral central pulmonary thromboembolism. There is mild right heart strain based on the RV to LV ratio,    Brief Summary:- 72 yo female with multiple medical problems including etoh abuse, anxiety/depression, dyslipidemia, fibromyalgia and possible history of prior MI, admitted on 01/11/2018 to [facility] with chest pain and ruled-in for ACS with troponin of 0.3, trending down to 0.19. Transferred on 01/11/2018 to [facility] for further care .  Patient was on IV heparin since 01/11/2018 pending PE rule out and for possible NSTEMI . CTA chest from 01/13/2018 consistent with acute bilateral central PE  PlaN 1)Atypical CP/mostly Right-sided/Elevated troponin--patient is now chest pain-free, cardiology consult appreciated, echocardiogram with preserved EF over 60% grade 1 diastolic dysfunction,, no wall motion abnormalities, however patient does have pulmonary hypertension (PA pressure is 57), nuclear  stress test done on 01/13/2018, results pending, continue aspirin and metoprolol  2)AKI----acute kidney injury  -    suspect prerenal etiology, creatinine is improving creatinine on admission= 1.70 ,   baseline creatinine = 0.98    , creatinine is now= 1.27     ,  Avoid nephrotoxic agents/dehydration/hypotension  3)Tobacco Abuse-  smoking cessation advised, use nicotine patch  4)Atypical chest pains with elevated d-dimer- Patient was on IV heparin since 01/11/2018 pending PE rule out and for possible NSTEMI .  CTA chest study was delayed due to acute kidney injury with admission creatinine of 1.7, baseline creatinine usually 0.9.  Creatinine went down to 1.27 so proceeded with CTA chest on 01/13/2018, it is  consistent with acute bilateral central PE, no further shortness of breath on 01/13/2018, lower extremity venous Dopplers pending.  Clinically patient is not hypoxic not tachypneic, hemodynamically stable. continue IV heparin, plan to discharge home on Eliquis  5)HTN-improved BP control, continue metoprolol 25 mg twice daily,  may use IV Hydralazine 10 mg  Every 4 hours Prn for systolic blood pressure over 160 mmhg  6)Sz DO-stable, no seizures, continue Lamictal, Keppra, as well as gabapentin ,    7)Depression/Anxiety--- stable, continue trazodone 50 mg nightly, risperidone 0.5 mg nightly, c/n Lexapro 20 mg to daily   Code Status : full  Disposition Plan  : home possibly with home health services to improve  compliance  Consults  : Cardiology   DVT Prophylaxis  : IV heparin Lab Results  Component Value Date   PLT 218 01/13/2018    Inpatient Medications  Scheduled Meds: . aspirin EC  81 mg Oral Daily  . escitalopram  20 mg Oral Daily  . gabapentin  600 mg Oral BID  . iopamidol      . lamoTRIgine  100 mg Oral BID  . levETIRAcetam  500 mg Oral BID  . metoprolol tartrate  25 mg Oral BID  . nicotine  7 mg Transdermal Daily  . oxybutynin  10 mg Oral QHS  . risperiDONE  0.5 mg Oral QHS   . traZODone  50 mg Oral QHS   Continuous Infusions: . heparin 1,100 Units/hr (01/12/18 2019)   PRN Meds:.acetaminophen **OR** acetaminophen, hydrALAZINE, nitroGLYCERIN, ondansetron **OR** ondansetron (ZOFRAN) IV    Anti-infectives (From admission, onward)   None        Objective:   Vitals:   01/13/18 0935 01/13/18 0936 01/13/18 0938 01/13/18 1047  BP: 138/88 138/88 (!) 151/93 (!) 154/85  Pulse:  97  90  Resp:      Temp:    98.5 F (36.9 C)  TempSrc:    Oral  SpO2:    99%  Weight:      Height:        Wt Readings from Last 3 Encounters:  01/13/18 89.1 kg (196 lb 8 oz)  12/10/17 92.1 kg (203 lb 1.3 oz)  11/14/17 90.7 kg (200 lb)     Intake/Output Summary (Last 24 hours) at 01/13/2018 1332 Last data filed at 01/13/2018 1300 Gross per 24 hour  Intake 841 ml  Output 1000 ml  Net -159 ml    Physical Exam  Gen:- Awake Alert, no acute distress HEENT:- [place].AT, No sclera icterus Neck-Supple Neck,No JVD,.  Lungs-  CTAB , good air movement CV- S1, S2 normal Abd-  +ve B.Sounds, Abd Soft, No tenderness,    Extremity/Skin:-Warm and dry   good pulses Psych-affect is appropriate, oriented x3 Neuro-no new focal deficits, no tremors   Data Review:   Micro Results No results found for this or any previous visit (from the past 240 hour(s)).  Radiology Reports Dg Chest 2 View  Result Date: 01/10/2018 CLINICAL DATA:  Shortness of breath EXAM: CHEST - 2 VIEW COMPARISON:  02/06/2016 FINDINGS: Mild bronchitic changes. No acute opacity or pleural effusion. Normal heart size. Aortic atherosclerosis. No pneumothorax. IMPRESSION: No active cardiopulmonary disease. Electronically Signed   By: Jasmine Pang M.D.   On: 01/10/2018 23:48   Ct Angio Chest Pe W Or Wo Contrast  Result Date: 01/13/2018 CLINICAL DATA:  Short of breath.  Positive D-dimer. EXAM: CT ANGIOGRAPHY CHEST WITH CONTRAST TECHNIQUE: Multidetector CT imaging of the chest was performed using the standard protocol during  bolus administration of intravenous contrast. Multiplanar CT image reconstructions and MIPs were obtained to evaluate the vascular anatomy. CONTRAST:  ISOVUE-370 IOPAMIDOL (ISOVUE-370) INJECTION 76% COMPARISON:  CT abdomen and pelvis 03/22/2015 FINDINGS: Cardiovascular: Positive central right and left pulmonary artery thromboembolism is present. There are low-density serpiginous filling defects in the main right pulmonary artery extending into lobar and segmental vessels. There are similar low-density serpiginous filling defects in the left pulmonary artery extending into branches of the lingula and left lower lobe. The heart is enlarged. Right ventricle to left ventricle ratio is 1.2 compatible with mild elevated right heart pressure in comparison to the prior CT, the RV to LV ratio is stable. Aortic  arch atherosclerotic calcification. No obvious evidence of dissection. Maximal diameter of the ascending aorta is 4.0 cm. Mild coronary artery calcification. Mediastinum/Nodes: No abnormal mediastinal adenopathy. Physiologic pericardial fluid. Thyroid is unremarkable. Lungs/Pleura: No pneumothorax. No pleural effusion. Dependent atelectasis and low lung volumes. Upper Abdomen: Small cyst adjacent to the gallbladder is stable. Musculoskeletal: Degenerative disc disease at C6-7 is partially imaged. No vertebral compression deformity. Review of the MIP images confirms the above findings. IMPRESSION: The study is positive for bilateral central pulmonary thromboembolism. There is mild right heart strain based on the RV to LV ratio, however based on the prior CT, this is stable and therefore a chronic finding. Critical Value/emergent results were called by telephone at the time of interpretation on 01/13/2018 at 10:58 am to [facility] RN, who verbally acknowledged these results. Aortic Atherosclerosis (ICD10-I70.0). Electronically Signed   By: Jolaine Click M.D.   On: 01/13/2018 10:58     CBC Recent Labs  Lab  01/10/18 2144 01/11/18 0201 01/12/18 0352 01/13/18 0348  WBC 9.4 10.6* 8.9 10.2  HGB 13.7 12.8 12.7 12.7  HCT 42.4 39.6 38.6 38.3  PLT 262 227 233 218  MCV 95.7 95.4 93.0 92.3  MCH 30.9 30.8 30.6 30.6  MCHC 32.3 32.3 32.9 33.2  RDW 14.6 14.6 14.2 14.2    Chemistries  Recent Labs  Lab 01/10/18 2144 01/11/18 0201 01/12/18 0352 01/13/18 0348  NA 143 140 139 143  K 4.2 4.5 3.9 3.9  CL 105 103 105 109  CO2 GLUCOSE 120* 103* 92 96  BUN 19 21* 20 10  CREATININE 1.70* 1.69* 1.45* 1.27*  CALCIUM 10.0 9.5 9.1 9.6  MG  --   --  2.0 1.9  AST  --   --  14* 14*  ALT  --   --  10* 10*  ALKPHOS  --   --  62 62  BILITOT  --   --  0.5 0.5   ------------------------------------------------------------------------------------------------------------------ No results for input(s): CHOL, HDL, LDLCALC, TRIG, CHOLHDL, LDLDIRECT in the last 72 hours.  No results found for: HGBA1C ------------------------------------------------------------------------------------------------------------------ No results for input(s): TSH, T4TOTAL, T3FREE, THYROIDAB in the last 72 hours.  Invalid input(s): FREET3 ------------------------------------------------------------------------------------------------------------------ No results for input(s): VITAMINB12, FOLATE, FERRITIN, TIBC, IRON, RETICCTPCT in the last 72 hours.  Coagulation profile Recent Labs  Lab 01/11/18 0201  INR 1.00    Recent Labs    01/12/18 1057  DDIMER 8.50*    Cardiac Enzymes Recent Labs  Lab 01/11/18 0141 01/11/18 0742 01/11/18 1237  TROPONINI 0.29* 0.19* 0.24*   ------------------------------------------------------------------------------------------------------------------ No results found for: BNP   Elle Vezina M.D on 01/13/2018 at 1:32 PM  Between 7am to 7pm - Pager - [phone]  After 7pm go to [email or web address] - password [email or web address]  [facility] -  Office  [phone]   Voice  Recognition Reubin Milan dictation system was used to create this note, attempts have been made to correct errors. Please contact the author with questions and/or clarifications.

## 2018-01-13 NOTE — Plan of Care (Signed)
  Problem: Education: Goal: Knowledge of General Education information will improve Outcome: Progressing   

## 2018-01-13 NOTE — Progress Notes (Addendum)
  ANTICOAGULATION CONSULT NOTE  Pharmacy Consult for heparin Indication: ACS/CP  Allergies  Allergen Reactions  . Prozac [Fluoxetine Hcl] Other (See Comments)    Headaches   Patient Measurements: Height:  (172.7 cm) Weight: 196 lb 8 oz (89.1 kg) IBW/kg (Calculated) : 63.9 Heparin Dosing Weight: 85 kg  Vital Signs: Temp: 98.8 F (37.1 C) (05/27 0316) Temp Source: Oral (05/27 0316) BP: 166/105 (05/27 0316) Pulse Rate: 78 (05/27 0316)  Labs: Recent Labs    01/11/18 0141 01/11/18 0201 01/11/18 0742 01/11/18 0900 01/11/18 1237 01/12/18 0352 01/13/18 0348  HGB  --  12.8  --   --   --  12.7 12.7  HCT  --  39.6  --   --   --  38.6 38.3  PLT  --  227  --   --   --  233 218  APTT  --  29  --   --   --   --   --   LABPROT  --  13.1  --   --   --   --   --   INR  --  1.00  --   --   --   --   --   HEPARINUNFRC  --   --   --  0.40  --  0.42 0.56  CREATININE  --  1.69*  --   --   --  1.45* 1.27*  TROPONINI 0.29*  --  0.19*  --  0.24*  --   --    Estimated Creatinine Clearance: 48.8 mL/min (A) (by C-G formula based on SCr of 1.27 mg/dL (H)).  Infusions:  . heparin 1,100 Units/hr (01/12/18 2019)   PRN: acetaminophen **OR** acetaminophen, hydrALAZINE, nitroGLYCERIN, ondansetron **OR** ondansetron (ZOFRAN) IV  Assessment: 72 yo female transferred from [facility] with heparin infusing for cardiac cath (possibly 5/28). Heparin level remains therapeutic at 0.56. CBC stable, no overt bleeding or infusion issues reported.   Goal of Therapy:  Heparin level 0.3-0.7 units/ml Monitor platelets by anticoagulation protocol: Yes   Plan:  Continue heparin gtt at 1100 units/hr Daily Heparin Level and CBC Monitor for signs and symptoms of bleeding  Adline Potter, PharmD Pharmacy Resident Pager: [phone]  Addendum: CT chest of 5/27 positive for bilateral central pulmonary Thromboembolism with mild right heart strain.  Consult to pharmacy is now for PE, not ACS.  - Continue  heparin at current rate - Plan to d/c on Eliquis

## 2018-01-14 LAB — COMPREHENSIVE METABOLIC PANEL
ALT: 16 U/L (ref 14–54)
AST: 22 U/L (ref 15–41)
Albumin: 3.4 g/dL — ABNORMAL LOW (ref 3.5–5.0)
Alkaline Phosphatase: 60 U/L (ref 38–126)
Anion gap: 8 (ref 5–15)
BILIRUBIN TOTAL: 0.6 mg/dL (ref 0.3–1.2)
BUN: 9 mg/dL (ref 6–20)
CO2: 27 mmol/L (ref 22–32)
CREATININE: 1.31 mg/dL — AB (ref 0.44–1.00)
Calcium: 10 mg/dL (ref 8.9–10.3)
Chloride: 108 mmol/L (ref 101–111)
GFR calc Af Amer: 47 mL/min — ABNORMAL LOW (ref >60)
GFR, EST NON AFRICAN AMERICAN: 41 mL/min — AB (ref >60)
Glucose, Bld: 103 mg/dL — ABNORMAL HIGH (ref 65–99)
POTASSIUM: 4.1 mmol/L (ref 3.5–5.1)
Sodium: 143 mmol/L (ref 135–145)
TOTAL PROTEIN: 6.8 g/dL (ref 6.5–8.1)

## 2018-01-14 LAB — CBC
HEMATOCRIT: 38.3 % (ref 36.0–46.0)
Hemoglobin: 12.6 g/dL (ref 12.0–15.0)
MCH: 30.7 pg (ref 26.0–34.0)
MCHC: 32.9 g/dL (ref 30.0–36.0)
MCV: 93.4 fL (ref 78.0–100.0)
Platelets: 222 10*3/uL (ref 150–400)
RBC: 4.1 MIL/uL (ref 3.87–5.11)
RDW: 14.5 % (ref 11.5–15.5)
WBC: 8.1 10*3/uL (ref 4.0–10.5)

## 2018-01-14 LAB — MAGNESIUM: MAGNESIUM: 2 mg/dL (ref 1.7–2.4)

## 2018-01-14 LAB — HEPARIN LEVEL (UNFRACTIONATED): Heparin Unfractionated: 0.46 IU/mL (ref 0.30–0.70)

## 2018-01-14 MED ORDER — APIXABAN 5 MG PO TABS
10.0000 mg | ORAL_TABLET | Freq: Once | ORAL | Status: AC
  Administered 2018-01-14: 10 mg via ORAL
  Filled 2018-01-14: qty 2

## 2018-01-14 MED ORDER — METOPROLOL TARTRATE 50 MG PO TABS: 50.0000 mg | ORAL_TABLET | Freq: Two times a day (BID) | ORAL | 2 refills | Status: DC

## 2018-01-14 MED ORDER — AMLODIPINE BESYLATE 2.5 MG PO TABS
2.5000 mg | ORAL_TABLET | Freq: Every day | ORAL | Status: DC
  Administered 2018-01-14: 2.5 mg via ORAL
  Filled 2018-01-14: qty 1

## 2018-01-14 MED ORDER — APIXABAN (ELIQUIS) EDUCATION KIT FOR DVT/PE PATIENTS: 1.0000 | PACK | Freq: Once | 0 refills | Status: AC

## 2018-01-14 MED ORDER — OLMESARTAN MEDOXOMIL 20 MG PO TABS: 20.0000 mg | ORAL_TABLET | Freq: Every day | ORAL | 3 refills | Status: DC

## 2018-01-14 MED ORDER — APIXABAN 5 MG PO TABS: 5.0000 mg | ORAL_TABLET | Freq: Two times a day (BID) | ORAL | 2 refills | Status: DC

## 2018-01-14 MED ORDER — NICOTINE 7 MG/24HR TD PT24: 7.0000 mg | MEDICATED_PATCH | Freq: Every day | TRANSDERMAL | 0 refills | Status: DC

## 2018-01-14 MED ORDER — ESCITALOPRAM OXALATE 20 MG PO TABS: 20.0000 mg | ORAL_TABLET | Freq: Every day | ORAL | 2 refills | Status: DC

## 2018-01-14 MED ORDER — ELIQUIS 5 MG VTE STARTER PACK: ORAL_TABLET | ORAL | 0 refills | Status: DC

## 2018-01-14 MED ORDER — AMLODIPINE BESYLATE 5 MG PO TABS: 5.0000 mg | ORAL_TABLET | Freq: Every day | ORAL | 2 refills | Status: DC

## 2018-01-14 NOTE — Discharge Summary (Signed)
Jennifer Jacobs, is a 72 y.o. female  DOB 04-16-1946  MRN [id number].  Admission date:  01/10/2018  Admitting Physician  [place], DO  Discharge Date:  01/14/2018   Primary MD  Patient, No Pcp Per  Recommendations for primary care physician for things to follow:  1)Take Eliquis/Apixaban blood thinners as prescribed and as instructed 2)Avoid ibuprofen/Advil/Aleve/Motrin/Goody Powders/Naproxen/BC powders/Meloxicam/Diclofenac/Indomethacin and other Nonsteroidal anti-inflammatory medications as these will make you more likely to bleed and can cause stomach ulcers, can also cause Kidney problems.  3)Your blood pressure medications have been adjusted, please take note of the changes 4) call or return if any concerns about bleeding 6) you will need to be on Eliquis blood thinner for at least 6 months,your primary care doctor would decide with you if you need to be on it beyond 6 months 7)Repeat CBC and BMP blood test with primary care doctor within a week 8) quit smoking !!!   Admission Diagnosis  NSTEMI (non-ST elevated myocardial infarction) ([place]) [I21.4]   Discharge Diagnosis  NSTEMI (non-ST elevated myocardial infarction) ([place]) [I21.4]    Principal Problem:   Acute pulmonary embolism ([place]) Active Problems:   Smoker unmotivated to quit   Major depression   Fibromyalgia   Benign essential HTN   Seizure disorder ([place])   Chest pain   Elevated troponin   AKI (acute kidney injury) ([place])   NSTEMI (non-ST elevated myocardial infarction) [facility])      Past Medical History:  Diagnosis Date  . Acute blood loss anemia   . AKI (acute kidney injury) ([place]) 01/11/2018  . Alcohol abuse 07/05/2014  . Allergy   . Anxiety   . Anxiety and depression   . Arthritis   . Cognitive deficit due to recent stroke 11/22/2016  . Depression   . Dyslipidemia   . Fibromyalgia   . Hepatic cyst   . Hyperlipidemia   .  Hypertension   . Leukocytosis   . Liver cyst 03/18/2014   01/26/2014 ultrasound Hypoechoic structure adjacent to gallbladder right lobe liver,  measuring 29 x 18 x 17 mm, possibly septated cyst    . MI (myocardial infarction) ([place]) 2007   By patient report, not substantiated by recent nuclear stress test.  . Neuromuscular disorder ([place])   . Seizure disorder ([place])    none since 2002  . Seizures ([place])   . Smoker unmotivated to quit    Quit for 3 years and then restarted  . Substance abuse ([place])    ETOH  . Vision abnormalities     Past Surgical History:  Procedure Laterality Date  . CRANIOTOMY Left 11/19/2016   Procedure: CRANIOTOMY INTRACRANIAL FOR  ANEURYSM CLIPPING;  Surgeon: Kevan Ny Ditty, MD;  Location: [place];  Service: Neurosurgery;  Laterality: Left;  . ELBOW ARTHROPLASTY Left    rods  . Exercise tolerance test  04/27/2013   CPET-MET: Submaximal effort (0.96, with goal of greater than 1.09) -- patient states that her effort was limited due to knee pain.  This makes the rest  of the interpretation difficult: Peak VO2 - 10.5 (59% moderate), peak 02-pulse -  7..11 (low); heart rate 114 (73% -chronic incompetence considered);; PFTs normal   . MOUTH SURGERY    . NM MYOVIEW LTD  05/19/2013   LexiScan: EF 80%, no evidence of ischemia or infarction  . TRANSTHORACIC ECHOCARDIOGRAM  03/25/2013   EF 55-60%, no regional wall motion abnormalities; normal diastolic parameters, normal pulmonary pressures.  No valvular lesions noted.  Essentially normal echo        HPI  from the history and physical done on the day of admission:    Chief Complaint: Chest pain/SOB  HPI: Jennifer Jacobs is a 72 y.o. female with medical history significant for hypertension, anxiety/depression, seizure disorder, ongoing tobacco abuse, prior history of alcohol abuse, and fibromyalgia who has presented to the emergency department with ongoing intermittent chest pain that began approximately 2 days ago.  The  patient describes this as a pressure-like sensation in her lower substernal region that is not associated with anything that she does in particular.  She denies any radiation of the pain.  She states that the pain would last for several minutes and then spontaneously go away.  There has been some associated shortness of breath and palpitations with this along with diaphoresis, nausea, and vomiting.  As a result of the nausea and vomiting, she has had very little oral intake over the last 2 days and feels as though she is dry.  She appears to think that she may have had a heart attack several years ago, but cannot recollect much of the history surrounding this event. She denies any cough, fever, chills, lower extremity edema, orthopnea, or paroxysmal nocturnal dyspnea.   ED Course: All signs are stable.  Laboratory data remarkable for creatinine 1.7 with baseline of 0.8-1.  Additionally, patient has troponin of 0.32.  Two-view chest x-ray with no acute findings and EKG with normal sinus rhythm at 85 bpm and incomplete left bundle branch block that appears similar to prior EKG on 12/10/2017.  She is currently chest pain-free and denies any symptoms and appears hemodynamically stable.  ED physician did speak with Cardiology fellow on-call who agrees with transfer to [facility] for further evaluation by Cardiology.    Hospital Course:     CTA Chest 01/13/18 IMPRESSION: The study is positive for bilateral central pulmonary thromboembolism. There is mild right heart strain based on the RV to LV ratio,     Brief Summary:- 72 yo female with multiple medical problems including etoh abuse, anxiety/depression, dyslipidemia, fibromyalgia and possible history of prior MI, admitted on 01/11/2018 to [facility] with chest pain and ruled-in for ACS with troponin of 0.3, trending down to 0.19. Transferred on 01/11/2018 to [facility] for further care .  Patient was on IV heparin since 01/11/2018 pending PE rule out and for  possible NSTEMI . CTA chest from 01/13/2018 consistent with acute bilateral central PE  PlaN 1)Atypical CP/mostly Right-sided/Elevated troponin--patient is now chest pain-free, cardiology consult appreciated, echocardiogram with preserved EF over 20% grade 1 diastolic dysfunction,, no wall motion abnormalities, however patient does have pulmonary hypertension (PA pressure is 57), nuclear stress test done on 01/13/2018 without significant reversible ischemia, continue aspirin and metoprolol  2)AKI----acute kidney injury  -    suspect prerenal etiology, creatinine is improving creatinine on admission= 1.70 ,   baseline creatinine = 0.98    , creatinine is now= 1.27     ,  Avoid nephrotoxic agents/dehydration/hypotension  3)Tobacco Abuse-  smoking  cessation advised, use nicotine patch  4)Atypical chest pains with elevated d-dimer- Patient was on IV heparin since 01/11/2018 pending PE rule out and for possible NSTEMI .  CTA chest study was delayed due to acute kidney injury with admission creatinine of 1.7, baseline creatinine usually 0.9.  Creatinine went down to 1.27 so proceeded with CTA chest on 01/13/2018, it was  consistent with acute bilateral central PE, no further shortness of breath on 01/13/2018, lower extremity venous Dopplers with noted.  Clinically patient is not hypoxic not tachypneic, hemodynamically stable.   Treated with IV heparin,  discharge home on Eliquis  5)HTN-patient not at goal, amlodipine 5 mg daily diet, continue metoprolol 25 mg twice daily,     6)Sz DO-stable, no seizures, continue Lamictal, Keppra, as well as gabapentin ,    7)Depression/Anxiety--- stable, continue trazodone 50 mg nightly, risperidone 0.5 mg nightly, c/n Lexapro 20 mg to daily   Code Status : full  Disposition Plan  : home   with home health services to improve compliance  Consults  : Cardiology  Discharge Condition: stable  Follow UP-PCP for recheck as advised  Diet and Activity  recommendation:  As advised  Discharge Instructions    Discharge Instructions    Call MD for:  difficulty breathing, headache or visual disturbances   Complete by:  As directed    Call MD for:  persistant dizziness or light-headedness   Complete by:  As directed    Call MD for:  persistant nausea and vomiting   Complete by:  As directed    Call MD for:  severe uncontrolled pain   Complete by:  As directed    Call MD for:  temperature >100.4   Complete by:  As directed    Diet - low sodium heart healthy   Complete by:  As directed    Discharge instructions   Complete by:  As directed    1)Take Eliquis/Apixaban blood thinners as prescribed and as instructed 2)Avoid ibuprofen/Advil/Aleve/Motrin/Goody Powders/Naproxen/BC powders/Meloxicam/Diclofenac/Indomethacin and other Nonsteroidal anti-inflammatory medications as these will make you more likely to bleed and can cause stomach ulcers, can also cause Kidney problems.  3)Your blood pressure medications have been adjusted, please take note of the changes 4) call or return if any concerns about bleeding 6) you will need to be on Eliquis blood thinner for at least 6 months,your primary care doctor would decide with you if you need to be on it beyond 6 months 7)Repeat CBC and BMP blood test with primary care doctor within a week   Increase activity slowly   Complete by:  As directed        Discharge Medications     Allergies as of 01/14/2018      Reactions   Prozac [fluoxetine Hcl] Other (See Comments)   Headaches      Medication List    STOP taking these medications   hydrochlorothiazide 25 MG tablet Commonly known as:  HYDRODIURIL   potassium chloride SA 20 MEQ tablet Commonly known as:  K-DUR,KLOR-CON     TAKE these medications   acetaminophen 325 MG tablet Commonly known as:  TYLENOL Take 1-2 tablets (325-650 mg total) by mouth every 4 (four) hours as needed for mild pain.   amLODipine 5 MG tablet Commonly known as:   NORVASC Take 1 tablet (5 mg total) by mouth daily.   apixaban Kit Commonly known as:  ELIQUIS 1 kit by Does not apply route once for 1 dose.   ELIQUIS STARTER PACK  5 MG Tabs Take as directed on package: start with two-23m tablets twice daily for 7 days. On day 8, switch to one-539mtablet twice daily.   apixaban 5 MG Tabs tablet Commonly known as:  ELIQUIS Take 1 tablet (5 mg total) by mouth 2 (two) times daily. Start around 02/13/18 after he finished the initial starter pack dose   escitalopram 20 MG tablet Commonly known as:  LEXAPRO Take 1 tablet (20 mg total) by mouth daily. What changed:  when to take this   gabapentin 800 MG tablet Commonly known as:  NEURONTIN 800 mg 3 (three) times daily.   lamoTRIgine 100 MG tablet Commonly known as:  LAMICTAL Take 1 tablet (100 mg total) by mouth 2 (two) times daily.   levETIRAcetam 500 MG tablet Commonly known as:  KEPPRA Take 1 tablet (500 mg total) 2 (two) times daily by mouth.   metoprolol tartrate 50 MG tablet Commonly known as:  LOPRESSOR Take 1 tablet (50 mg total) by mouth 2 (two) times daily. What changed:    medication strength  how much to take   nicotine 7 mg/24hr patch Commonly known as:  NICODERM CQ - dosed in mg/24 hr Place 1 patch (7 mg total) onto the skin daily.   olmesartan 20 MG tablet Commonly known as:  BENICAR Take 1 tablet (20 mg total) by mouth daily.   oxybutynin 10 MG 24 hr tablet Commonly known as:  DITROPAN-XL Take 1 tablet (10 mg total) by mouth at bedtime.   risperiDONE 0.5 MG tablet Commonly known as:  RISPERDAL Take 1 tablet (0.5 mg total) by mouth at bedtime.   traZODone 50 MG tablet Commonly known as:  DESYREL Take 1 tablet (50 mg total) by mouth at bedtime.      Major procedures and Radiology Reports - PLEASE review detailed and final reports for all details, in brief -   Dg Chest 2 View  Result Date: 01/10/2018 CLINICAL DATA:  Shortness of breath EXAM: CHEST - 2 VIEW  COMPARISON:  02/06/2016 FINDINGS: Mild bronchitic changes. No acute opacity or pleural effusion. Normal heart size. Aortic atherosclerosis. No pneumothorax. IMPRESSION: No active cardiopulmonary disease. Electronically Signed   By: KiDonavan Foil.D.   On: 01/10/2018 23:48   Ct Angio Chest Pe W Or Wo Contrast  Result Date: 01/13/2018 CLINICAL DATA:  Short of breath.  Positive D-dimer. EXAM: CT ANGIOGRAPHY CHEST WITH CONTRAST TECHNIQUE: Multidetector CT imaging of the chest was performed using the standard protocol during bolus administration of intravenous contrast. Multiplanar CT image reconstructions and MIPs were obtained to evaluate the vascular anatomy. CONTRAST:  10098mSOVUE-370 IOPAMIDOL (ISOVUE-370) INJECTION 76% COMPARISON:  CT abdomen and pelvis 03/22/2015 FINDINGS: Cardiovascular: Positive central right and left pulmonary artery thromboembolism is present. There are low-density serpiginous filling defects in the main right pulmonary artery extending into lobar and segmental vessels. There are similar low-density serpiginous filling defects in the left pulmonary artery extending into branches of the lingula and left lower lobe. The heart is enlarged. Right ventricle to left ventricle ratio is 1.2 compatible with mild elevated right heart pressure in comparison to the prior CT, the RV to LV ratio is stable. Aortic arch atherosclerotic calcification. No obvious evidence of dissection. Maximal diameter of the ascending aorta is 4.0 cm. Mild coronary artery calcification. Mediastinum/Nodes: No abnormal mediastinal adenopathy. Physiologic pericardial fluid. Thyroid is unremarkable. Lungs/Pleura: No pneumothorax. No pleural effusion. Dependent atelectasis and low lung volumes. Upper Abdomen: Small cyst adjacent to the gallbladder is stable. Musculoskeletal: Degenerative  disc disease at C6-7 is partially imaged. No vertebral compression deformity. Review of the MIP images confirms the above findings.  IMPRESSION: The study is positive for bilateral central pulmonary thromboembolism. There is mild right heart strain based on the RV to LV ratio, however based on the prior CT, this is stable and therefore a chronic finding. Critical Value/emergent results were called by telephone at the time of interpretation on 01/13/2018 at 10:58 am to [place], who verbally acknowledged these results. Aortic Atherosclerosis (ICD10-I70.0). Electronically Signed   By: Marybelle Killings M.D.   On: 01/13/2018 10:58   Nm Myocar Multi W/spect W/wall Motion / Ef  Result Date: 01/13/2018  There was no ST segment deviation noted during stress.  T wave inversion was noted during stress in the V1, V2, V4, V3, V5, V6, II, III and aVF leads.  Defect 1: There is a medium defect of severe severity present in the basal inferior and mid inferior location.  This is a low risk study.  Nuclear stress EF: 78%.  The left ventricular ejection fraction is hyperdynamic (>65%).  Abnormal, low risk stress nuclear study with significant soft tissue attenuation (inferior wall) vs prior infarct; no significant ischemia; EF 78 with normal wall motion.    Micro Results   No results found for this or any previous visit (from the past 240 hour(s)).     Today   Subjective    Poppy Ibsen today has no complaints, no further chest pains, no shortness of breath, no leg pains,          Patient has been seen and examined prior to discharge   Objective   Blood pressure (!) 176/89, pulse 62, temperature 98 F (36.7 C), temperature source Oral, resp. rate 16, height _0  (1.727 m), weight 89 kg (196 lb 4.8 oz), SpO2 100 %.   Intake/Output Summary (Last 24 hours) at 01/14/2018 1007 Last data filed at 01/14/2018 0900 Gross per 24 hour  Intake 1071.02 ml  Output -  Net 1071.02 ml    Exam Gen:- Awake Alert, no acute distress HEENT:- [place].AT, No sclera icterus Neck-Supple Neck,No JVD,.  Lungs-  CTAB , good air movement CV- S1,  S2 normal Abd-  +ve B.Sounds, Abd Soft, No tenderness,    Extremity/Skin:-Warm and dry   good pulses Psych-affect is appropriate, oriented x3 Neuro-no new focal deficits, no tremors    Data Review   CBC w Diff:  Lab Results  Component Value Date   WBC 8.1 01/14/2018   HGB 12.6 01/14/2018   HCT 38.3 01/14/2018   PLT 222 01/14/2018   LYMPHOPCT 29 12/08/2016   MONOPCT 6 12/08/2016   EOSPCT 2 12/08/2016   BASOPCT 0 12/08/2016    CMP:  Lab Results  Component Value Date   NA 143 01/14/2018   K 4.1 01/14/2018   CL 108 01/14/2018   CO2 27 01/14/2018   BUN 9 01/14/2018   CREATININE 1.31 (H) 01/14/2018   PROT 6.8 01/14/2018   ALBUMIN 3.4 (L) 01/14/2018   BILITOT 0.6 01/14/2018   ALKPHOS 60 01/14/2018   AST 22 01/14/2018   ALT 16 01/14/2018  .   Total Discharge time is about 33 minutes  Roxan Hockey M.D on 01/14/2018 at 10:07 AM  [facility]   Office  [phone]  Voice Recognition Viviann Spare dictation system was used to create this note, attempts have been made to correct errors. Please contact the author with questions and/or clarifications.

## 2018-01-14 NOTE — Progress Notes (Signed)
The patient has been given discharge instructions along with a new medication list and what to take today. She has education on hypertension, follow up appointments, paper prescriptions to pick up. She is awaiting her family member to discharge via car.   Sheppard Evens RN

## 2018-01-14 NOTE — Care Management Note (Addendum)
Case Management Note  Patient Details  Name: Jennifer Jacobs MRN: [id number] Date of Birth: 11/19/1945  Subjective/Objective: Pt presented for Chest Pain and SOB + for Acute Bilateral PE. Plan for Eliquis for home.                    Action/Plan: Benefits Check in process and CM will make pt aware of cost once completed. CM will provide pt with 30 day free card as well. No further needs from CM at this time.   Expected Discharge Date:  01/14/18               Expected Discharge Plan:  Home/Self Care  In-House Referral:  NA  Discharge planning Services  CM Consult, Medication Assistance  Post Acute Care Choice:  NA Choice offered to:  NA  DME Arranged:  N/A DME Agency:  NA  HH Arranged:  NA HH Agency:  NA  Status of Service:  Completed, signed off  If discussed at Long Length of Stay Meetings, dates discussed:    Additional Comments: 1239 01-14-18 Tomi Bamberger, RN,BSN [phone] CM did speak with pt in regards to Eliquis- 30 day free card provided. CM did call [facility] [place] and starter pack is not in stock. [facility] in [place] does not have starter pack in stock. CM did call [facility] and medication is not in stock-pt is aware.  CM had to call [facility] and the mediation is available. No further needs from CM @ this time.    Gala Lewandowsky, RN 01/14/2018, 10:03 AM

## 2018-01-14 NOTE — Plan of Care (Signed)
  Problem: Clinical Measurements: Goal: Diagnostic test results will improve Outcome: Progressing   

## 2018-01-14 NOTE — Progress Notes (Signed)
   01/14/18 1134  Vitals  BP (!) 164/92  BP Location Left Arm  BP Method Manual  Pulse Rate 71  ECG Heart Rate 81  Resp (!) 22  Oxygen Therapy  SpO2 100 %   Notified MD Emokpae about BP; MD okay to discharge with BP medication changes at home.   Sheppard Evens RN

## 2018-01-14 NOTE — Progress Notes (Signed)
ANTICOAGULATION CONSULT NOTE  Pharmacy Consult for heparin Indication: ACS + acute bilateral PE  Allergies  Allergen Reactions  . Prozac [Fluoxetine Hcl] Other (See Comments)    Headaches   Patient Measurements: Height:  (172.7 cm) Weight: 196 lb 4.8 oz (89 kg) IBW/kg (Calculated) : 63.9 Heparin Dosing Weight: 85 kg  Vital Signs: Temp: 98 F (36.7 C) (05/28 0606) Temp Source: Oral (05/28 0606) BP: 176/89 (05/28 0606) Pulse Rate: 62 (05/28 0606)  Labs: Recent Labs    01/11/18 1237  01/12/18 0352 01/13/18 0348 01/14/18 0706  HGB  --    < > 12.7 12.7 12.6  HCT  --   --  38.6 38.3 38.3  PLT  --   --  233 218 222  HEPARINUNFRC  --   --  0.42 0.56 0.46  CREATININE  --   --  1.45* 1.27* 1.31*  TROPONINI 0.24*  --   --   --   --    < > = values in this interval not displayed.   Estimated Creatinine Clearance: 47.3 mL/min (A) (by C-G formula based on SCr of 1.31 mg/dL (H)).  Infusions:  . heparin 1,100 Units/hr (01/13/18 2030)   PRN: acetaminophen **OR** acetaminophen, hydrALAZINE, nitroGLYCERIN, ondansetron **OR** ondansetron (ZOFRAN) IV  Assessment: 72 yo female transferred from [facility] with CP for possible cath, continuing on heparin. Now found to have bilateral PE with mild RHS - plan is to discharge on apixaban. Dopplers negative for DVT. Heparin level remains therapeutic. CBC stable, no bleed documented.  Goal of Therapy:  Heparin level 0.3-0.7 units/ml Monitor platelets by anticoagulation protocol: Yes   Plan:  Continue heparin gtt at 1100 units/hr Monitor daily heparin level and CBC, s/sx bleeding Plan is to discharge on apixaban, f/u Cards plans  Babs Bertin, PharmD, BCPS Clinical Pharmacist Clinical phone for 01/14/2018 until 3:30pm: [phone] If after 3:30pm, please call main pharmacy at: [phone] 01/14/2018 9:23 AM

## 2018-01-14 NOTE — Progress Notes (Addendum)
Progress Note  Patient Name: Jennifer Jacobs Date of Encounter: 01/14/2018  Primary Cardiologist: Bryan Lemma, MD  Subjective   Feeling well this morning.   Inpatient Medications    Scheduled Meds: . aspirin EC  81 mg Oral Daily  . escitalopram  20 mg Oral Daily  . gabapentin  600 mg Oral BID  . lamoTRIgine  100 mg Oral BID  . levETIRAcetam  500 mg Oral BID  . metoprolol tartrate  25 mg Oral BID  . nicotine  7 mg Transdermal Daily  . oxybutynin  10 mg Oral QHS  . risperiDONE  0.5 mg Oral QHS  . traZODone  50 mg Oral QHS   Continuous Infusions: . heparin 1,100 Units/hr (01/13/18 2030)   PRN Meds: acetaminophen **OR** acetaminophen, hydrALAZINE, nitroGLYCERIN, ondansetron **OR** ondansetron (ZOFRAN) IV   Vital Signs    Vitals:   01/13/18 1047 01/13/18 2203 01/13/18 2247 01/14/18 0606  BP: (!) 154/85 (!) 159/94 138/82 (!) 176/89  Pulse: 90 67 63 62  Resp:   20 16  Temp: 98.5 F (36.9 C)  98.3 F (36.8 C) 98 F (36.7 C)  TempSrc: Oral  Oral Oral  SpO2: 99%  96% 100%  Weight:    196 lb 4.8 oz (89 kg)  Height:        Intake/Output Summary (Last 24 hours) at 01/14/2018 5784 Last data filed at 01/14/2018 0500 Gross per 24 hour  Intake 953.02 ml  Output -  Net 953.02 ml   Filed Weights   01/12/18 0320 01/13/18 0316 01/14/18 0606  Weight: 197 lb 11.2 oz (89.7 kg) 196 lb 8 oz (89.1 kg) 196 lb 4.8 oz (89 kg)    Telemetry    SR - Personally Reviewed  ECG    N/a - Personally Reviewed  Physical Exam   General: Well developed, well nourished, female appearing in no acute distress. Head: Normocephalic, atraumatic.  Neck: Supple without bruits, JVD. Lungs:  Resp regular and unlabored, CTA. Heart: RRR, S1, S2, no S3, S4, or murmur; no rub. Abdomen: Soft, non-tender, non-distended with normoactive bowel sounds.  Extremities: No clubbing, cyanosis, edema. Distal pedal pulses are 2+ bilaterally. Neuro: Alert and oriented X 3. Moves all extremities  spontaneously. Psych: Normal affect.  Labs    Chemistry Recent Labs  Lab 01/11/18 0201 01/12/18 0352 01/13/18 0348  NA 140 139 143  K 4.5 3.9 3.9  CL 103 105 109  CO2 GLUCOSE 103* 92 96  BUN 21* 20 10  CREATININE 1.69* 1.45* 1.27*  CALCIUM 9.5 9.1 9.6  PROT  --  6.4* 6.6  ALBUMIN  --  3.1* 3.4*  AST  --  14* 14*  ALT  --  10* 10*  ALKPHOS  --  62 62  BILITOT  --  0.5 0.5  GFRNONAA 30* 36* 42*  GFRAA 35* 42* 49*  ANIONGAP Hematology Recent Labs  Lab 01/12/18 0352 01/13/18 0348 01/14/18 0706  WBC 8.9 10.2 8.1  RBC 4.15 4.15 4.10  HGB 12.7 12.7 12.6  HCT 38.6 38.3 38.3  MCV 93.0 92.3 93.4  MCH 30.6 30.6 30.7  MCHC 32.9 33.2 32.9  RDW 14.2 14.2 14.5  PLT 233 218 222    Cardiac Enzymes Recent Labs  Lab 01/10/18 2144 01/11/18 0141 01/11/18 0742 01/11/18 1237  TROPONINI 0.32* 0.29* 0.19* 0.24*   No results for input(s): TROPIPOC in the last 168 hours.   BNPNo results for input(s): BNP,  PROBNP in the last 168 hours.   DDimer  Recent Labs  Lab 01/12/18 1057  DDIMER 8.50*      Radiology    Ct Angio Chest Pe W Or Wo Contrast  Result Date: 01/13/2018 CLINICAL DATA:  Short of breath.  Positive D-dimer. EXAM: CT ANGIOGRAPHY CHEST WITH CONTRAST TECHNIQUE: Multidetector CT imaging of the chest was performed using the standard protocol during bolus administration of intravenous contrast. Multiplanar CT image reconstructions and MIPs were obtained to evaluate the vascular anatomy. CONTRAST:  ISOVUE-370 IOPAMIDOL (ISOVUE-370) INJECTION 76% COMPARISON:  CT abdomen and pelvis 03/22/2015 FINDINGS: Cardiovascular: Positive central right and left pulmonary artery thromboembolism is present. There are low-density serpiginous filling defects in the main right pulmonary artery extending into lobar and segmental vessels. There are similar low-density serpiginous filling defects in the left pulmonary artery extending into branches of the lingula and  left lower lobe. The heart is enlarged. Right ventricle to left ventricle ratio is 1.2 compatible with mild elevated right heart pressure in comparison to the prior CT, the RV to LV ratio is stable. Aortic arch atherosclerotic calcification. No obvious evidence of dissection. Maximal diameter of the ascending aorta is 4.0 cm. Mild coronary artery calcification. Mediastinum/Nodes: No abnormal mediastinal adenopathy. Physiologic pericardial fluid. Thyroid is unremarkable. Lungs/Pleura: No pneumothorax. No pleural effusion. Dependent atelectasis and low lung volumes. Upper Abdomen: Small cyst adjacent to the gallbladder is stable. Musculoskeletal: Degenerative disc disease at C6-7 is partially imaged. No vertebral compression deformity. Review of the MIP images confirms the above findings. IMPRESSION: The study is positive for bilateral central pulmonary thromboembolism. There is mild right heart strain based on the RV to LV ratio, however based on the prior CT, this is stable and therefore a chronic finding. Critical Value/emergent results were called by telephone at the time of interpretation on 01/13/2018 at 10:58 am to [facility] RN, who verbally acknowledged these results. Aortic Atherosclerosis (ICD10-I70.0). Electronically Signed   By: Jolaine Click M.D.   On: 01/13/2018 10:58   Nm Myocar Multi W/spect W/wall Motion / Ef  Result Date: 01/13/2018  There was no ST segment deviation noted during stress.  T wave inversion was noted during stress in the V1, V2, V4, V3, V5, V6, II, III and aVF leads.  Defect 1: There is a medium defect of severe severity present in the basal inferior and mid inferior location.  This is a low risk study.  Nuclear stress EF: 78%.  The left ventricular ejection fraction is hyperdynamic (>65%).  Abnormal, low risk stress nuclear study with significant soft tissue attenuation (inferior wall) vs prior infarct; no significant ischemia; EF 78 with normal wall motion.    Cardiac  Studies   TTE: 01/11/18  Study Conclusions  - Left ventricle: The cavity size was normal. Wall thickness was   increased in a pattern of moderate LVH. Systolic function was   normal. The estimated ejection fraction was in the range of 60%   to 65%. Wall motion was normal; there were no regional wall   motion abnormalities. Doppler parameters are consistent with   abnormal left ventricular relaxation (grade 1 diastolic   dysfunction). The E/e&' ratio is <8, suggesting normal LV filling   pressure. - Left atrium: The atrium was normal in size. - Right ventricle: The cavity size was mildly dilated. Mildly   reduced systolic function. - Right atrium: The atrium was mildly dilated. - Tricuspid valve: There was mild regurgitation. - Pulmonary arteries: PA peak pressure: 57 mm Hg (  S). - Inferior vena cava: The vessel was normal in size. The   respirophasic diameter changes were in the normal range (>= 50%),   consistent with normal central venous pressure.  Impressions:  - Compared to a prior study in 03/2013, the LVEF is higher at   60-65%. The RVSP is also higher at 57 mmHg.  Lexiscan: 01/13/18   There was no ST segment deviation noted during stress.  T wave inversion was noted during stress in the V1, V2, V4, V3, V5, V6, II, III and aVF leads.  Defect 1: There is a medium defect of severe severity present in the basal inferior and mid inferior location.  This is a low risk study.  Nuclear stress EF: 78%.  The left ventricular ejection fraction is hyperdynamic (>65%).   Abnormal, low risk stress nuclear study with significant soft tissue attenuation (inferior wall) vs prior infarct; no significant ischemia; EF 78 with normal wall motion.  Patient Profile     72 y.o. female with multiple medical issues including etoh abuse, anxiety/depression, dyslipidemia, fibromyalgia and possible history of prior MI, presented to [facility] with chest pain and ruled-in for ACS with  troponin of 0.3, trending down to 0.19. Transferred to [facility] for further care.   Assessment & Plan    1. Right sided chest pain: Underwent lexiscan yesterday which was abnormal with tissue attenuation vs prior infarct but no ischemia noted. No further chest pain this morning. D-dimer elevated with + PE on CTA. Echo with normal EF and pulmonary hypertension.   2. Bilateral PEs: noted on CTA yesterday after elevated D-dimer of 8. Has been on IV heparin with plans to convert to Eliquis prior to discharge.   3. HTN: elevated at times. On BB therapy.   4. AKI: 1.7 on admission with trend down to 1.27   Signed, Laverda Page, NP  01/14/2018, 8:21 AM  Pager # [phone]   I have examined the patient and reviewed assessment and plan and discussed with patient.  Agree with above as stated.  Eliquis for bilateral PE.  BP control.  No further cardiac w/u at this time.  Stress test results reviewed.  Lance Muss   For questions or updates, please contact [facility] Please consult [email or web address] for contact info under Cardiology/STEMI.

## 2018-01-14 NOTE — Care Management Important Message (Signed)
Important Message  Patient Details  Name: Jennifer Jacobs MRN: [id number] Date of Birth: 1946-01-13   Medicare Important Message Given:       Orson Aloe 01/14/2018, 3:06 PM

## 2018-01-15 ENCOUNTER — Telehealth: Payer: Self-pay

## 2018-01-15 DIAGNOSIS — R69 Illness, unspecified: Secondary | ICD-10-CM | POA: Diagnosis not present

## 2018-01-15 DIAGNOSIS — M1990 Unspecified osteoarthritis, unspecified site: Secondary | ICD-10-CM | POA: Diagnosis not present

## 2018-01-15 DIAGNOSIS — G4700 Insomnia, unspecified: Secondary | ICD-10-CM | POA: Diagnosis not present

## 2018-01-15 DIAGNOSIS — I252 Old myocardial infarction: Secondary | ICD-10-CM | POA: Diagnosis not present

## 2018-01-15 DIAGNOSIS — G8929 Other chronic pain: Secondary | ICD-10-CM | POA: Diagnosis not present

## 2018-01-15 DIAGNOSIS — G629 Polyneuropathy, unspecified: Secondary | ICD-10-CM | POA: Diagnosis not present

## 2018-01-15 DIAGNOSIS — R569 Unspecified convulsions: Secondary | ICD-10-CM | POA: Diagnosis not present

## 2018-01-15 DIAGNOSIS — I2699 Other pulmonary embolism without acute cor pulmonale: Secondary | ICD-10-CM | POA: Diagnosis not present

## 2018-01-15 DIAGNOSIS — I10 Essential (primary) hypertension: Secondary | ICD-10-CM | POA: Diagnosis not present

## 2018-01-15 NOTE — Telephone Encounter (Signed)
29May2019  On TOC list however patient is a former Radio broadcast assistant patient and was sent a letter on 11Mar2019 informing her she needed to find a new PCP.

## 2018-01-16 ENCOUNTER — Encounter: Payer: Self-pay | Admitting: Cardiology

## 2018-01-30 ENCOUNTER — Institutional Professional Consult (permissible substitution): Payer: Self-pay | Admitting: Neurology

## 2018-01-30 ENCOUNTER — Ambulatory Visit: Payer: Medicare HMO | Admitting: Cardiology

## 2018-01-30 NOTE — Progress Notes (Deleted)
Clinical Summary Jennifer Jacobs is a 72 y.o.female seen for follow up of the following medical problems.  1. Chest pain - admit 12/2017 with chest pain, mild troponin - nuclear stress with inferior infarct vs artifact, no clear ischemia - 12/2017 echo LVEF 60-65%, no WMAs, grade I diastolic dysfunction. Mild RV dysfunction, PASP 57 - CT PE + for PE -   - has no pcp  2. CKD   3.   Past Medical History:  Diagnosis Date  . Acute blood loss anemia   . AKI (acute kidney injury) ([place]) 01/11/2018  . Alcohol abuse 07/05/2014  . Allergy   . Anxiety   . Anxiety and depression   . Arthritis   . Cognitive deficit due to recent stroke 11/22/2016  . Depression   . Dyslipidemia   . Fibromyalgia   . Hepatic cyst   . Hyperlipidemia   . Hypertension   . Leukocytosis   . Liver cyst 03/18/2014   01/26/2014 ultrasound Hypoechoic structure adjacent to gallbladder right lobe liver,  measuring 29 x 18 x 17 mm, possibly septated cyst    . MI (myocardial infarction) ([place]) 2007   By patient report, not substantiated by recent nuclear stress test.  . Neuromuscular disorder ([place])   . Seizure disorder ([place])    none since 2002  . Seizures ([place])   . Smoker unmotivated to quit    Quit for 3 years and then restarted  . Substance abuse ([place])    ETOH  . Vision abnormalities      Allergies  Allergen Reactions  . Prozac [Fluoxetine Hcl] Other (See Comments)    Headaches     Current Outpatient Medications  Medication Sig Dispense Refill  . acetaminophen (TYLENOL) 325 MG tablet Take 1-2 tablets (325-650 mg total) by mouth every 4 (four) hours as needed for mild pain.    Marland Kitchen amLODipine (NORVASC) 5 MG tablet Take 1 tablet (5 mg total) by mouth daily. 30 tablet 2  . apixaban (ELIQUIS) 5 MG TABS tablet Take 1 tablet (5 mg total) by mouth 2 (two) times daily. Start around 02/13/18 after he finished the initial starter pack dose 60 tablet 2  . ELIQUIS STARTER PACK (ELIQUIS STARTER PACK) 5 MG TABS Take as  directed on package: start with two-31m tablets twice daily for 7 days. On day 8, switch to one-564mtablet twice daily. 1 each 0  . escitalopram (LEXAPRO) 20 MG tablet Take 1 tablet (20 mg total) by mouth daily. 30 tablet 2  . gabapentin (NEURONTIN) 800 MG tablet 800 mg 3 (three) times daily.     . Marland KitchenamoTRIgine (LAMICTAL) 100 MG tablet Take 1 tablet (100 mg total) by mouth 2 (two) times daily. 180 tablet 1  . levETIRAcetam (KEPPRA) 500 MG tablet Take 1 tablet (500 mg total) 2 (two) times daily by mouth. 180 tablet 1  . metoprolol tartrate (LOPRESSOR) 50 MG tablet Take 1 tablet (50 mg total) by mouth 2 (two) times daily. 60 tablet 2  . nicotine (NICODERM CQ - DOSED IN MG/24 HR) 7 mg/24hr patch Place 1 patch (7 mg total) onto the skin daily. 28 patch 0  . olmesartan (BENICAR) 20 MG tablet Take 1 tablet (20 mg total) by mouth daily. 30 tablet 3  . oxybutynin (DITROPAN-XL) 10 MG 24 hr tablet Take 1 tablet (10 mg total) by mouth at bedtime. 90 tablet 0  . risperiDONE (RISPERDAL) 0.5 MG tablet Take 1 tablet (0.5 mg total) by mouth at bedtime. 90 tablet  2  . traZODone (DESYREL) 50 MG tablet Take 1 tablet (50 mg total) by mouth at bedtime. 90 tablet 2   No current facility-administered medications for this visit.      Past Surgical History:  Procedure Laterality Date  . CRANIOTOMY Left 11/19/2016   Procedure: CRANIOTOMY INTRACRANIAL FOR  ANEURYSM CLIPPING;  Surgeon: Kevan Ny Ditty, MD;  Location: [facility];  Service: Neurosurgery;  Laterality: Left;  . ELBOW ARTHROPLASTY Left    rods  . Exercise tolerance test  04/27/2013   CPET-MET: Submaximal effort (0.96, with goal of greater than 1.09) -- patient states that her effort was limited due to knee pain.  This makes the rest of the interpretation difficult: Peak VO2 - 10.5 (59% moderate), peak 02-pulse -  7..11 (low); heart rate 114 (73% -chronic incompetence considered);; PFTs normal   . MOUTH SURGERY    . NM MYOVIEW LTD  05/19/2013   LexiScan: EF  80%, no evidence of ischemia or infarction  . TRANSTHORACIC ECHOCARDIOGRAM  03/25/2013   EF 55-60%, no regional wall motion abnormalities; normal diastolic parameters, normal pulmonary pressures.  No valvular lesions noted.  Essentially normal echo      Allergies  Allergen Reactions  . Prozac [Fluoxetine Hcl] Other (See Comments)    Headaches      Family History  Problem Relation Age of Onset  . Heart attack Father 32  . Hypertension Father   . Alcohol abuse Father   . Heart attack Maternal Grandmother   . Cancer Maternal Grandfather        type unknown  . Cancer Paternal Grandmother        type unknown  . Depression Mother   . Alcohol abuse Mother   . Ovarian cancer Paternal Aunt   . Cancer Paternal Aunt        ovarian  . Prostate cancer Paternal Uncle   . Stomach cancer Paternal Aunt   . Diabetes Daughter   . Heart disease Daughter 48       heart attack  . Depression Sister   . Alcohol abuse Brother   . Depression Sister   . Alcohol abuse Sister   . Depression Sister   . Alcohol abuse Sister   . Alcohol abuse Brother   . Alcohol abuse Son      Social History Ms. Mcmeekin reports that she has been smoking cigarettes.  She has been smoking about 0.50 packs per day. She has never used smokeless tobacco. Ms. Cypress reports that she does not drink alcohol.   Review of Systems CONSTITUTIONAL: No weight loss, fever, chills, weakness or fatigue.  HEENT: Eyes: No visual loss, blurred vision, double vision or yellow sclerae.No hearing loss, sneezing, congestion, runny nose or sore throat.  SKIN: No rash or itching.  CARDIOVASCULAR:  RESPIRATORY: No shortness of breath, cough or sputum.  GASTROINTESTINAL: No anorexia, nausea, vomiting or diarrhea. No abdominal pain or blood.  GENITOURINARY: No burning on urination, no polyuria NEUROLOGICAL: No headache, dizziness, syncope, paralysis, ataxia, numbness or tingling in the extremities. No change in bowel or bladder control.    MUSCULOSKELETAL: No muscle, back pain, joint pain or stiffness.  LYMPHATICS: No enlarged nodes. No history of splenectomy.  PSYCHIATRIC: No history of depression or anxiety.  ENDOCRINOLOGIC: No reports of sweating, cold or heat intolerance. No polyuria or polydipsia.  Marland Kitchen   Physical Examination There were no vitals filed for this visit. There were no vitals filed for this visit.  Gen: resting comfortably, no acute distress HEENT:  no scleral icterus, pupils equal round and reactive, no palptable cervical adenopathy,  CV Resp: Clear to auscultation bilaterally GI: abdomen is soft, non-tender, non-distended, normal bowel sounds, no hepatosplenomegaly MSK: extremities are warm, no edema.  Skin: warm, no rash Neuro:  no focal deficits Psych: appropriate affect   Diagnostic Studies 12/2017 nuclear stress  There was no ST segment deviation noted during stress.  T wave inversion was noted during stress in the V1, V2, V4, V3, V5, V6, II, III and aVF leads.  Defect 1: There is a medium defect of severe severity present in the basal inferior and mid inferior location.  This is a low risk study.  Nuclear stress EF: 78%.  The left ventricular ejection fraction is hyperdynamic (>65%).   Abnormal, low risk stress nuclear study with significant soft tissue attenuation (inferior wall) vs prior infarct; no significant ischemia; EF 78 with normal wall motion.    Assessment and Plan        [name], M.D., F.A.C.C.

## 2018-01-31 ENCOUNTER — Encounter: Payer: Self-pay | Admitting: Neurology

## 2018-02-02 ENCOUNTER — Other Ambulatory Visit: Payer: Self-pay | Admitting: Family Medicine

## 2018-02-06 ENCOUNTER — Ambulatory Visit: Payer: Self-pay | Admitting: Cardiology

## 2018-02-07 ENCOUNTER — Ambulatory Visit: Payer: Medicare HMO | Admitting: Internal Medicine

## 2018-02-10 ENCOUNTER — Encounter: Payer: Self-pay | Admitting: Internal Medicine

## 2018-03-02 ENCOUNTER — Other Ambulatory Visit: Payer: Self-pay | Admitting: Family Medicine

## 2018-03-04 ENCOUNTER — Ambulatory Visit (HOSPITAL_COMMUNITY): Payer: Medicare HMO | Admitting: Psychiatry

## 2018-03-18 ENCOUNTER — Encounter (HOSPITAL_COMMUNITY): Payer: Self-pay | Admitting: Psychiatry

## 2018-03-18 ENCOUNTER — Ambulatory Visit (INDEPENDENT_AMBULATORY_CARE_PROVIDER_SITE_OTHER): Payer: Medicare HMO | Admitting: Psychiatry

## 2018-03-18 VITALS — BP 164/85 | HR 61 | Ht 68.0 in | Wt 191.0 lb

## 2018-03-18 DIAGNOSIS — F332 Major depressive disorder, recurrent severe without psychotic features: Secondary | ICD-10-CM | POA: Diagnosis not present

## 2018-03-18 DIAGNOSIS — Z811 Family history of alcohol abuse and dependence: Secondary | ICD-10-CM | POA: Diagnosis not present

## 2018-03-18 DIAGNOSIS — R69 Illness, unspecified: Secondary | ICD-10-CM | POA: Diagnosis not present

## 2018-03-18 DIAGNOSIS — F17210 Nicotine dependence, cigarettes, uncomplicated: Secondary | ICD-10-CM

## 2018-03-18 DIAGNOSIS — F1290 Cannabis use, unspecified, uncomplicated: Secondary | ICD-10-CM

## 2018-03-18 DIAGNOSIS — Z813 Family history of other psychoactive substance abuse and dependence: Secondary | ICD-10-CM | POA: Diagnosis not present

## 2018-03-18 DIAGNOSIS — Z818 Family history of other mental and behavioral disorders: Secondary | ICD-10-CM | POA: Diagnosis not present

## 2018-03-18 MED ORDER — ESCITALOPRAM OXALATE 20 MG PO TABS: 20.0000 mg | ORAL_TABLET | Freq: Two times a day (BID) | ORAL | 2 refills | Status: AC

## 2018-03-18 MED ORDER — RISPERIDONE 0.5 MG PO TABS: 0.5000 mg | ORAL_TABLET | Freq: Every day | ORAL | 2 refills | Status: AC

## 2018-03-18 MED ORDER — LAMOTRIGINE 100 MG PO TABS: 100.0000 mg | ORAL_TABLET | Freq: Two times a day (BID) | ORAL | 1 refills | Status: DC

## 2018-03-18 MED ORDER — TRAZODONE HCL 50 MG PO TABS: 50.0000 mg | ORAL_TABLET | Freq: Every day | ORAL | 2 refills | Status: AC
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
## 2018-04-04 ENCOUNTER — Ambulatory Visit (INDEPENDENT_AMBULATORY_CARE_PROVIDER_SITE_OTHER): Payer: Medicare HMO | Admitting: Cardiology

## 2018-04-04 ENCOUNTER — Encounter: Payer: Self-pay | Admitting: Cardiology

## 2018-04-04 VITALS — BP 126/82 | HR 72 | Ht 68.0 in | Wt 189.0 lb

## 2018-04-04 DIAGNOSIS — Z86711 Personal history of pulmonary embolism: Secondary | ICD-10-CM

## 2018-04-04 DIAGNOSIS — R0789 Other chest pain: Secondary | ICD-10-CM

## 2018-04-04 DIAGNOSIS — R0602 Shortness of breath: Secondary | ICD-10-CM

## 2018-04-04 MED ORDER — AMLODIPINE BESYLATE 5 MG PO TABS: 5.0000 mg | ORAL_TABLET | Freq: Every day | ORAL | 6 refills | Status: DC

## 2018-04-04 MED ORDER — APIXABAN 5 MG PO TABS: 5.0000 mg | ORAL_TABLET | Freq: Two times a day (BID) | ORAL | 6 refills | Status: DC

## 2018-04-04 MED ORDER — METOPROLOL TARTRATE 50 MG PO TABS: 50.0000 mg | ORAL_TABLET | Freq: Two times a day (BID) | ORAL | 6 refills | Status: DC

## 2018-04-04 MED ORDER — OLMESARTAN MEDOXOMIL 20 MG PO TABS: 20.0000 mg | ORAL_TABLET | Freq: Every day | ORAL | 6 refills | Status: DC

## 2018-04-04 NOTE — Progress Notes (Signed)
Clinical Summary Ms. Sotomayor is a 72 y.o.female seen today for hospital follow up, this is our first visit together   1. Chest pain - admit 12/2017 with chest pain. Elevated troponins Lexiscan at that time without ischemia. Found to have bilateral PEs - no recent chest pain, can have some SOB.     2. Pulmonary embolism - bilateral PEs 12/2017 - echo with LVEF 60-65%, no WMAs, grade I diastoilc dysfunction. PASP 57, mild RV dysfunction   - no prior history of blood clots. From history appears to be unprovoked.  - no bleeding on eliquis.  - ongoing SOB. Some dizziness.    3. SOB - recent PE - long smoking history 30 years. - can have cough, wheezing. Can be productive.   Past Medical History:  Diagnosis Date  . Acute blood loss anemia   . AKI (acute kidney injury) ([facility]) 01/11/2018  . Alcohol abuse 07/05/2014  . Allergy   . Anxiety   . Anxiety and depression   . Arthritis   . Cognitive deficit due to recent stroke 11/22/2016  . Depression   . Dyslipidemia   . Fibromyalgia   . Hepatic cyst   . Hyperlipidemia   . Hypertension   . Leukocytosis   . Liver cyst 03/18/2014   01/26/2014 ultrasound Hypoechoic structure adjacent to gallbladder right lobe liver,  measuring 29 x 18 x 17 mm, possibly septated cyst    . MI (myocardial infarction) ([facility]) 2007   By patient report, not substantiated by recent nuclear stress test.  . Neuromuscular disorder ([place])   . Seizure disorder ([place])    none since 2002  . Seizures ([facility])   . Smoker unmotivated to quit    Quit for 3 years and then restarted  . Substance abuse ([facility])    ETOH  . Vision abnormalities      Allergies  Allergen Reactions  . Prozac [Fluoxetine Hcl] Other (See Comments)    Headaches     Current Outpatient Medications  Medication Sig Dispense Refill  . acetaminophen (TYLENOL) 325 MG tablet Take 1-2 tablets (325-650 mg total) by mouth every 4 (four) hours as needed for mild pain.    Marland Kitchen amLODipine (NORVASC) 5 MG  tablet Take 1 tablet (5 mg total) by mouth daily. 30 tablet 2  . apixaban (ELIQUIS) 5 MG TABS tablet Take 1 tablet (5 mg total) by mouth 2 (two) times daily. Start around 02/13/18 after he finished the initial starter pack dose 60 tablet 2  . ELIQUIS STARTER PACK (ELIQUIS STARTER PACK) 5 MG TABS Take as directed on package: start with two-76m tablets twice daily for 7 days. On day 8, switch to one-575mtablet twice daily. 1 each 0  . escitalopram (LEXAPRO) 20 MG tablet Take 1 tablet (20 mg total) by mouth 2 (two) times daily. 60 tablet 2  . gabapentin (NEURONTIN) 800 MG tablet 800 mg 3 (three) times daily.     . Marland KitchenamoTRIgine (LAMICTAL) 100 MG tablet Take 1 tablet (100 mg total) by mouth 2 (two) times daily. 180 tablet 1  . levETIRAcetam (KEPPRA) 500 MG tablet Take 1 tablet (500 mg total) 2 (two) times daily by mouth. 180 tablet 1  . metoprolol tartrate (LOPRESSOR) 50 MG tablet Take 1 tablet (50 mg total) by mouth 2 (two) times daily. 60 tablet 2  . nicotine (NICODERM CQ - DOSED IN MG/24 HR) 7 mg/24hr patch Place 1 patch (7 mg total) onto the skin daily. 28 patch 0  . olmesartan (  BENICAR) 20 MG tablet Take 1 tablet (20 mg total) by mouth daily. 30 tablet 3  . oxybutynin (DITROPAN-XL) 10 MG 24 hr tablet Take 1 tablet (10 mg total) by mouth at bedtime. 90 tablet 0  . risperiDONE (RISPERDAL) 0.5 MG tablet Take 1 tablet (0.5 mg total) by mouth at bedtime. 90 tablet 2  . traZODone (DESYREL) 50 MG tablet Take 1 tablet (50 mg total) by mouth at bedtime. 90 tablet 2   No current facility-administered medications for this visit.      Past Surgical History:  Procedure Laterality Date  . CRANIOTOMY Left 11/19/2016   Procedure: CRANIOTOMY INTRACRANIAL FOR  ANEURYSM CLIPPING;  Surgeon: Kevan Ny Ditty, MD;  Location: [facility];  Service: Neurosurgery;  Laterality: Left;  . ELBOW ARTHROPLASTY Left    rods  . Exercise tolerance test  04/27/2013   CPET-MET: Submaximal effort (0.96, with goal of greater than  1.09) -- patient states that her effort was limited due to knee pain.  This makes the rest of the interpretation difficult: Peak VO2 - 10.5 (59% moderate), peak 02-pulse -  7..11 (low); heart rate 114 (73% -chronic incompetence considered);; PFTs normal   . MOUTH SURGERY    . NM MYOVIEW LTD  05/19/2013   LexiScan: EF 80%, no evidence of ischemia or infarction  . TRANSTHORACIC ECHOCARDIOGRAM  03/25/2013   EF 55-60%, no regional wall motion abnormalities; normal diastolic parameters, normal pulmonary pressures.  No valvular lesions noted.  Essentially normal echo      Allergies  Allergen Reactions  . Prozac [Fluoxetine Hcl] Other (See Comments)    Headaches      Family History  Problem Relation Age of Onset  . Heart attack Father 87  . Hypertension Father   . Alcohol abuse Father   . Heart attack Maternal Grandmother   . Cancer Maternal Grandfather        type unknown  . Cancer Paternal Grandmother        type unknown  . Depression Mother   . Alcohol abuse Mother   . Ovarian cancer Paternal Aunt   . Cancer Paternal Aunt        ovarian  . Prostate cancer Paternal Uncle   . Stomach cancer Paternal Aunt   . Diabetes Daughter   . Heart disease Daughter 44       heart attack  . Depression Sister   . Alcohol abuse Brother   . Depression Sister   . Alcohol abuse Sister   . Depression Sister   . Alcohol abuse Sister   . Alcohol abuse Brother   . Alcohol abuse Son      Social History Ms. Haro reports that she has been smoking cigarettes. She has been smoking about 0.50 packs per day. She has never used smokeless tobacco. Ms. Burrous reports that she does not drink alcohol.   Review of Systems CONSTITUTIONAL: No weight loss, fever, chills, weakness or fatigue.  HEENT: Eyes: No visual loss, blurred vision, double vision or yellow sclerae.No hearing loss, sneezing, congestion, runny nose or sore throat.  SKIN: No rash or itching.  CARDIOVASCULAR: no chest pain, no palpitations.   RESPIRATORY: +SOB, wheezing, cough GASTROINTESTINAL: No anorexia, nausea, vomiting or diarrhea. No abdominal pain or blood.  GENITOURINARY: No burning on urination, no polyuria NEUROLOGICAL: No headache, dizziness, syncope, paralysis, ataxia, numbness or tingling in the extremities. No change in bowel or bladder control.  MUSCULOSKELETAL: No muscle, back pain, joint pain or stiffness.  LYMPHATICS: No enlarged nodes. No  history of splenectomy.  PSYCHIATRIC: No history of depression or anxiety.  ENDOCRINOLOGIC: No reports of sweating, cold or heat intolerance. No polyuria or polydipsia.  Marland Kitchen   Physical Examination Vitals:   04/04/18 0855 04/04/18 0900  BP: 116/80 126/82  Pulse: 72   SpO2: 97%    Vitals:   04/04/18 0855  Weight: 189 lb (85.7 kg)  Height: '5\' 8"'  (1.727 m)    Gen: resting comfortably, no acute distress HEENT: no scleral icterus, pupils equal round and reactive, no palptable cervical adenopathy,  CV: RRR, no m/r/g, no jvd Resp: Clear to auscultation bilaterally GI: abdomen is soft, non-tender, non-distended, normal bowel sounds, no hepatosplenomegaly MSK: extremities are warm, no edema.  Skin: warm, no rash Neuro:  no focal deficits Psych: appropriate affect   Diagnostic Studies  TTE: 01/11/18  Study Conclusions  - Left ventricle: The cavity size was normal. Wall thickness was increased in a pattern of moderate LVH. Systolic function was normal. The estimated ejection fraction was in the range of 60% to 65%. Wall motion was normal; there were no regional wall motion abnormalities. Doppler parameters are consistent with abnormal left ventricular relaxation (grade 1 diastolic dysfunction). The E/e&' ratio is <8, suggesting normal LV filling pressure. - Left atrium: The atrium was normal in size. - Right ventricle: The cavity size was mildly dilated. Mildly reduced systolic function. - Right atrium: The atrium was mildly dilated. -  Tricuspid valve: There was mild regurgitation. - Pulmonary arteries: PA peak pressure: 57 mm Hg (S). - Inferior vena cava: The vessel was normal in size. The respirophasic diameter changes were in the normal range (>= 50%), consistent with normal central venous pressure.  Impressions:  - Compared to a prior study in 03/2013, the LVEF is higher at 60-65%. The RVSP is also higher at 57 mmHg.  Lexiscan: 01/13/18   There was no ST segment deviation noted during stress.  T wave inversion was noted during stress in the V1, V2, V4, V3, V5, V6, II, III and aVF leads.  Defect 1: There is a medium defect of severe severity present in the basal inferior and mid inferior location.  This is a low risk study.  Nuclear stress EF: 78%.  The left ventricular ejection fraction is hyperdynamic (>65%).  Abnormal, low risk stress nuclear study with significant soft tissue attenuation (inferior wall) vs prior infarct; no significant ischemia; EF    Assessment and Plan  1. Chest pain - negative cardiac workup during recent admission, was found to have a PE - no rececent symptoms  2. Pulmonary embolism - from what I can tell from her history and records appears to be unprovoked, I don't see where this was clearly documented during her admission - continue indefinite anticoagulation  3. SOB - recent PE may be playing a role - also with long smoking history, cough, wheezing. Suspect some element of COPD. Obtain PFTs - fairly benign cardiac workup recently with normal stress test. Echo normal LVEF, and only grade I diastolic dysfunction. Elevated PASP likely due to her PE, will need repeat echo at follow up.      F/u 6 months   [name], M.D.,

## 2018-04-04 NOTE — Patient Instructions (Signed)
Medication Instructions:  Your physician recommends that you continue on your current medications as directed. Please refer to the Current Medication list given to you today.   Labwork: none  Testing/Procedures: Your physician has recommended that you have a pulmonary function test. Pulmonary Function Tests are a group of tests that measure how well air moves in and out of your lungs.    Follow-Up: Your physician wants you to follow-up in: 6 months.  You will receive a reminder letter in the mail two months in advance. If you don't receive a letter, please call our office to schedule the follow-up appointment.   Any Other Special Instructions Will Be Listed Below (If Applicable).     If you need a refill on your cardiac medications before your next appointment, please call your pharmacy.

## 2018-04-08 ENCOUNTER — Other Ambulatory Visit: Payer: Self-pay | Admitting: Internal Medicine

## 2018-04-08 DIAGNOSIS — R69 Illness, unspecified: Secondary | ICD-10-CM | POA: Diagnosis not present

## 2018-04-08 DIAGNOSIS — N3281 Overactive bladder: Secondary | ICD-10-CM | POA: Diagnosis not present

## 2018-04-08 DIAGNOSIS — I2699 Other pulmonary embolism without acute cor pulmonale: Secondary | ICD-10-CM | POA: Diagnosis not present

## 2018-04-08 DIAGNOSIS — I10 Essential (primary) hypertension: Secondary | ICD-10-CM | POA: Diagnosis not present

## 2018-04-08 DIAGNOSIS — G9009 Other idiopathic peripheral autonomic neuropathy: Secondary | ICD-10-CM | POA: Diagnosis not present

## 2018-04-08 DIAGNOSIS — Z780 Asymptomatic menopausal state: Secondary | ICD-10-CM

## 2018-04-08 DIAGNOSIS — G4700 Insomnia, unspecified: Secondary | ICD-10-CM | POA: Diagnosis not present

## 2018-04-08 DIAGNOSIS — E782 Mixed hyperlipidemia: Secondary | ICD-10-CM | POA: Diagnosis not present

## 2018-04-08 DIAGNOSIS — G4089 Other seizures: Secondary | ICD-10-CM | POA: Diagnosis not present

## 2018-04-08 DIAGNOSIS — Z1231 Encounter for screening mammogram for malignant neoplasm of breast: Secondary | ICD-10-CM

## 2018-04-08 DIAGNOSIS — I639 Cerebral infarction, unspecified: Secondary | ICD-10-CM | POA: Diagnosis not present

## 2018-04-08 DIAGNOSIS — R0602 Shortness of breath: Secondary | ICD-10-CM | POA: Diagnosis not present

## 2018-04-15 ENCOUNTER — Ambulatory Visit (HOSPITAL_COMMUNITY)
Admission: RE | Admit: 2018-04-15 | Discharge: 2018-04-15 | Disposition: A | Discharge status: Home / Self Care | Payer: Medicare HMO | Source: Ambulatory Visit | Attending: Cardiology | Admitting: Cardiology

## 2018-04-15 DIAGNOSIS — R0602 Shortness of breath: Secondary | ICD-10-CM | POA: Insufficient documentation

## 2018-04-15 LAB — PULMONARY FUNCTION TEST
DL/VA % pred: 65 %
DL/VA: 3.43 ml/min/mmHg/L
DLCO UNC % PRED: 47 %
DLCO unc: 14.05 ml/min/mmHg
FEF 25-75 PRE: 2.51 L/s
FEF 25-75 Post: 1.86 L/sec
FEF2575-%Change-Post: -25 %
FEF2575-%PRED-PRE: 125 %
FEF2575-%Pred-Post: 93 %
FEV1-%Change-Post: -4 %
FEV1-%PRED-POST: 91 %
FEV1-%Pred-Pre: 96 %
FEV1-Post: 2.03 L
FEV1-Pre: 2.13 L
FEV1FVC-%CHANGE-POST: -2 %
FEV1FVC-%Pred-Pre: 106 %
FEV6-%CHANGE-POST: -8 %
FEV6-%PRED-PRE: 94 %
FEV6-%Pred-Post: 85 %
FEV6-POST: 2.35 L
FEV6-Pre: 2.58 L
FEV6FVC-%Change-Post: -1 %
FEV6FVC-%PRED-POST: 101 %
FEV6FVC-%Pred-Pre: 103 %
FVC-%Change-Post: -2 %
FVC-%Pred-Post: 89 %
FVC-%Pred-Pre: 91 %
FVC-Post: 2.53 L
FVC-Pre: 2.6 L
POST FEV6/FVC RATIO: 97 %
PRE FEV1/FVC RATIO: 82 %
Post FEV1/FVC ratio: 80 %
Pre FEV6/FVC Ratio: 99 %
RV % pred: 94 %
RV: 2.25 L
TLC % PRED: 89 %
TLC: 5.08 L

## 2018-04-15 MED ORDER — ALBUTEROL SULFATE (2.5 MG/3ML) 0.083% IN NEBU
2.5000 mg | INHALATION_SOLUTION | Freq: Once | RESPIRATORY_TRACT | Status: AC
Start: 2018-04-15 — End: 2018-04-15
  Administered 2018-04-15: 2.5 mg via RESPIRATORY_TRACT

## 2018-04-22 ENCOUNTER — Telehealth: Payer: Self-pay

## 2018-04-22 DIAGNOSIS — R942 Abnormal results of pulmonary function studies: Secondary | ICD-10-CM

## 2018-04-22 NOTE — Telephone Encounter (Signed)
Ca;;ed pt. She was not home. Left message for pt to return call. Referral put in for pulmonary.

## 2018-04-22 NOTE — Telephone Encounter (Signed)
-----   Message from Jonathan F Branch, MD sent at 04/22/2018  4:18 PM EDT ----- Abnormal breathing tests, show some decrease in abilility for lungs to absorb oxygen. Please refer to Dr Hawkins  J Branch MD 

## 2018-04-23 ENCOUNTER — Telehealth: Payer: Self-pay

## 2018-04-23 DIAGNOSIS — I2699 Other pulmonary embolism without acute cor pulmonale: Secondary | ICD-10-CM | POA: Diagnosis not present

## 2018-04-23 DIAGNOSIS — G9009 Other idiopathic peripheral autonomic neuropathy: Secondary | ICD-10-CM | POA: Diagnosis not present

## 2018-04-23 DIAGNOSIS — R69 Illness, unspecified: Secondary | ICD-10-CM | POA: Diagnosis not present

## 2018-04-23 DIAGNOSIS — I10 Essential (primary) hypertension: Secondary | ICD-10-CM | POA: Diagnosis not present

## 2018-04-23 DIAGNOSIS — N3281 Overactive bladder: Secondary | ICD-10-CM | POA: Diagnosis not present

## 2018-04-23 DIAGNOSIS — G4089 Other seizures: Secondary | ICD-10-CM | POA: Diagnosis not present

## 2018-04-23 DIAGNOSIS — R0602 Shortness of breath: Secondary | ICD-10-CM | POA: Diagnosis not present

## 2018-04-23 DIAGNOSIS — G4700 Insomnia, unspecified: Secondary | ICD-10-CM | POA: Diagnosis not present

## 2018-04-23 DIAGNOSIS — I639 Cerebral infarction, unspecified: Secondary | ICD-10-CM | POA: Diagnosis not present

## 2018-04-23 DIAGNOSIS — E782 Mixed hyperlipidemia: Secondary | ICD-10-CM | POA: Diagnosis not present

## 2018-04-23 NOTE — Telephone Encounter (Signed)
-----   Message from Antoine Poche, MD sent at 04/22/2018  4:18 PM EDT ----- Abnormal breathing tests, show some decrease in abilility for lungs to absorb oxygen. Please refer to Dr Dow Adolph MD

## 2018-04-23 NOTE — Telephone Encounter (Signed)
-----   Message from Jonathan F Branch, MD sent at 04/22/2018  4:18 PM EDT ----- Abnormal breathing tests, show some decrease in abilility for lungs to absorb oxygen. Please refer to Dr Hawkins  J Branch MD 

## 2018-04-23 NOTE — Telephone Encounter (Signed)
Called pt. No answer, left message for pt to return call.  

## 2018-04-30 DIAGNOSIS — Z8673 Personal history of transient ischemic attack (TIA), and cerebral infarction without residual deficits: Secondary | ICD-10-CM | POA: Diagnosis not present

## 2018-04-30 DIAGNOSIS — I10 Essential (primary) hypertension: Secondary | ICD-10-CM | POA: Diagnosis not present

## 2018-04-30 DIAGNOSIS — Z0001 Encounter for general adult medical examination with abnormal findings: Secondary | ICD-10-CM | POA: Diagnosis not present

## 2018-04-30 DIAGNOSIS — M79606 Pain in leg, unspecified: Secondary | ICD-10-CM | POA: Diagnosis not present

## 2018-04-30 DIAGNOSIS — Z6830 Body mass index (BMI) 30.0-30.9, adult: Secondary | ICD-10-CM | POA: Diagnosis not present

## 2018-04-30 DIAGNOSIS — R69 Illness, unspecified: Secondary | ICD-10-CM | POA: Diagnosis not present

## 2018-04-30 DIAGNOSIS — E782 Mixed hyperlipidemia: Secondary | ICD-10-CM | POA: Diagnosis not present

## 2018-04-30 DIAGNOSIS — N3281 Overactive bladder: Secondary | ICD-10-CM | POA: Diagnosis not present

## 2018-04-30 DIAGNOSIS — Z86711 Personal history of pulmonary embolism: Secondary | ICD-10-CM | POA: Diagnosis not present

## 2018-04-30 DIAGNOSIS — R0602 Shortness of breath: Secondary | ICD-10-CM | POA: Diagnosis not present

## 2018-05-05 ENCOUNTER — Ambulatory Visit (HOSPITAL_COMMUNITY): Payer: Self-pay

## 2018-05-05 ENCOUNTER — Other Ambulatory Visit (HOSPITAL_COMMUNITY): Payer: Self-pay

## 2018-05-13 DIAGNOSIS — Z86711 Personal history of pulmonary embolism: Secondary | ICD-10-CM | POA: Diagnosis not present

## 2018-05-13 DIAGNOSIS — I10 Essential (primary) hypertension: Secondary | ICD-10-CM | POA: Diagnosis not present

## 2018-05-13 DIAGNOSIS — E782 Mixed hyperlipidemia: Secondary | ICD-10-CM | POA: Diagnosis not present

## 2018-05-13 DIAGNOSIS — G4700 Insomnia, unspecified: Secondary | ICD-10-CM | POA: Diagnosis not present

## 2018-05-13 DIAGNOSIS — G9009 Other idiopathic peripheral autonomic neuropathy: Secondary | ICD-10-CM | POA: Diagnosis not present

## 2018-05-13 DIAGNOSIS — G4089 Other seizures: Secondary | ICD-10-CM | POA: Diagnosis not present

## 2018-05-13 DIAGNOSIS — I639 Cerebral infarction, unspecified: Secondary | ICD-10-CM | POA: Diagnosis not present

## 2018-05-13 DIAGNOSIS — Z8673 Personal history of transient ischemic attack (TIA), and cerebral infarction without residual deficits: Secondary | ICD-10-CM | POA: Diagnosis not present

## 2018-05-13 DIAGNOSIS — R0602 Shortness of breath: Secondary | ICD-10-CM | POA: Diagnosis not present

## 2018-05-13 DIAGNOSIS — I2699 Other pulmonary embolism without acute cor pulmonale: Secondary | ICD-10-CM | POA: Diagnosis not present

## 2018-05-19 ENCOUNTER — Ambulatory Visit (HOSPITAL_COMMUNITY): Payer: Self-pay | Admitting: Psychiatry

## 2018-05-19 DIAGNOSIS — H25813 Combined forms of age-related cataract, bilateral: Secondary | ICD-10-CM | POA: Diagnosis not present

## 2018-05-19 DIAGNOSIS — H25811 Combined forms of age-related cataract, right eye: Secondary | ICD-10-CM | POA: Diagnosis not present

## 2018-05-28 ENCOUNTER — Telehealth (HOSPITAL_COMMUNITY): Payer: Self-pay | Admitting: *Deleted

## 2018-05-28 DIAGNOSIS — I10 Essential (primary) hypertension: Secondary | ICD-10-CM | POA: Diagnosis not present

## 2018-05-28 DIAGNOSIS — I639 Cerebral infarction, unspecified: Secondary | ICD-10-CM | POA: Diagnosis not present

## 2018-05-28 DIAGNOSIS — Z8673 Personal history of transient ischemic attack (TIA), and cerebral infarction without residual deficits: Secondary | ICD-10-CM | POA: Diagnosis not present

## 2018-05-28 DIAGNOSIS — E782 Mixed hyperlipidemia: Secondary | ICD-10-CM | POA: Diagnosis not present

## 2018-05-28 DIAGNOSIS — G9009 Other idiopathic peripheral autonomic neuropathy: Secondary | ICD-10-CM | POA: Diagnosis not present

## 2018-05-28 DIAGNOSIS — R69 Illness, unspecified: Secondary | ICD-10-CM | POA: Diagnosis not present

## 2018-05-28 DIAGNOSIS — G4700 Insomnia, unspecified: Secondary | ICD-10-CM | POA: Diagnosis not present

## 2018-05-28 DIAGNOSIS — Z86711 Personal history of pulmonary embolism: Secondary | ICD-10-CM | POA: Diagnosis not present

## 2018-05-28 NOTE — Telephone Encounter (Signed)
Provider called checking on the correct dosage of Lexapro : She seems more depressed today and Lexapro will be increased from 20 to 40 mg daily.  Office Visit 03/18/2018

## 2018-06-09 ENCOUNTER — Ambulatory Visit (HOSPITAL_COMMUNITY): Payer: Self-pay | Admitting: Psychiatry

## 2018-06-12 NOTE — Patient Instructions (Signed)
Your procedure is scheduled on: 06/30/2018  Report to [facility] at  700   AM.  Call this number if you have problems the morning of surgery: [phone]   Do not eat food or drink liquids :After Midnight.      Take these medicines the morning of surgery with A SIP OF WATER: amlodipine, lexapro, gabapentin, lamictal, keppra, metoprolol, olmesartan.   Do not wear jewelry, make-up or nail polish.  Do not wear lotions, powders, or perfumes. You may wear deodorant.  Do not shave 48 hours prior to surgery.  Do not bring valuables to the hospital.  Contacts, dentures or bridgework may not be worn into surgery.  Leave suitcase in the car. After surgery it may be brought to your room.  For patients admitted to the hospital, checkout time is 11:00 AM the day of discharge.   Patients discharged the day of surgery will not be allowed to drive home.  :     Please read over the following fact sheets that you were given: Coughing and Deep Breathing, Surgical Site Infection Prevention, Anesthesia Post-op Instructions and Care and Recovery After Surgery    Cataract A cataract is a clouding of the lens of the eye. When a lens becomes cloudy, vision is reduced based on the degree and nature of the clouding. Many cataracts reduce vision to some degree. Some cataracts make people more near-sighted as they develop. Other cataracts increase glare. Cataracts that are ignored and become worse can sometimes look white. The white color can be seen through the pupil. CAUSES   Aging. However, cataracts may occur at any age, even in newborns.   Certain drugs.   Trauma to the eye.   Certain diseases such as diabetes.   Specific eye diseases such as chronic inflammation inside the eye or a sudden attack of a rare form of glaucoma.   Inherited or acquired medical problems.  SYMPTOMS   Gradual, progressive drop in vision in the affected eye.   Severe, rapid visual loss. This most often happens when trauma is  the cause.  DIAGNOSIS  To detect a cataract, an eye doctor examines the lens. Cataracts are best diagnosed with an exam of the eyes with the pupils enlarged (dilated) by drops.  TREATMENT  For an early cataract, vision may improve by using different eyeglasses or stronger lighting. If that does not help your vision, surgery is the only effective treatment. A cataract needs to be surgically removed when vision loss interferes with your everyday activities, such as driving, reading, or watching TV. A cataract may also have to be removed if it prevents examination or treatment of another eye problem. Surgery removes the cloudy lens and usually replaces it with a substitute lens (intraocular lens, IOL).  At a time when both you and your doctor agree, the cataract will be surgically removed. If you have cataracts in both eyes, only one is usually removed at a time. This allows the operated eye to heal and be out of danger from any possible problems after surgery (such as infection or poor wound healing). In rare cases, a cataract may be doing damage to your eye. In these cases, your caregiver may advise surgical removal right away. The vast majority of people who have cataract surgery have better vision afterward. HOME CARE INSTRUCTIONS  If you are not planning surgery, you may be asked to do the following:  Use different eyeglasses.   Use stronger or brighter lighting.   Ask your eye  doctor about reducing your medicine dose or changing medicines if it is thought that a medicine caused your cataract. Changing medicines does not make the cataract go away on its own.   Become familiar with your surroundings. Poor vision can lead to injury. Avoid bumping into things on the affected side. You are at a higher risk for tripping or falling.   Exercise extreme care when driving or operating machinery.   Wear sunglasses if you are sensitive to bright light or experiencing problems with glare.  SEEK IMMEDIATE  MEDICAL CARE IF:   You have a worsening or sudden vision loss.   You notice redness, swelling, or increasing pain in the eye.   You have a fever.  Document Released: 08/06/2005 Document Revised: 07/26/2011 Document Reviewed: 03/30/2011 [facility] Patient Information 2012 [place].PATIENT INSTRUCTIONS POST-ANESTHESIA  IMMEDIATELY FOLLOWING SURGERY:  Do not drive or operate machinery for the first twenty four hours after surgery.  Do not make any important decisions for twenty four hours after surgery or while taking narcotic pain medications or sedatives.  If you develop intractable nausea and vomiting or a severe headache please notify your doctor immediately.  FOLLOW-UP:  Please make an appointment with your surgeon as instructed. You do not need to follow up with anesthesia unless specifically instructed to do so.  WOUND CARE INSTRUCTIONS (if applicable):  Keep a dry clean dressing on the anesthesia/puncture wound site if there is drainage.  Once the wound has quit draining you may leave it open to air.  Generally you should leave the bandage intact for twenty four hours unless there is drainage.  If the epidural site drains for more than 36-48 hours please call the anesthesia department.  QUESTIONS?:  Please feel free to call your physician or the hospital operator if you have any questions, and they will be happy to assist you.

## 2018-06-20 ENCOUNTER — Encounter (HOSPITAL_COMMUNITY)
Admission: RE | Admit: 2018-06-20 | Discharge: 2018-06-20 | Disposition: A | Discharge status: Home / Self Care | Payer: Medicare HMO | Source: Ambulatory Visit | Attending: Ophthalmology | Admitting: Ophthalmology

## 2018-06-23 ENCOUNTER — Encounter (HOSPITAL_COMMUNITY)
Admission: RE | Admit: 2018-06-23 | Discharge: 2018-06-23 | Disposition: A | Discharge status: Home / Self Care | Payer: Medicare HMO | Source: Ambulatory Visit | Attending: Ophthalmology | Admitting: Ophthalmology

## 2018-06-23 ENCOUNTER — Encounter (HOSPITAL_COMMUNITY): Payer: Self-pay

## 2018-06-23 ENCOUNTER — Other Ambulatory Visit: Payer: Self-pay

## 2018-06-23 DIAGNOSIS — Z01812 Encounter for preprocedural laboratory examination: Secondary | ICD-10-CM | POA: Insufficient documentation

## 2018-06-23 LAB — CBC WITH DIFFERENTIAL/PLATELET
ABS IMMATURE GRANULOCYTES: 0.03 10*3/uL (ref 0.00–0.07)
BASOS ABS: 0 10*3/uL (ref 0.0–0.1)
BASOS PCT: 0 %
Eosinophils Absolute: 0.2 10*3/uL (ref 0.0–0.5)
Eosinophils Relative: 2 %
HCT: 38.2 % (ref 36.0–46.0)
HEMOGLOBIN: 11.7 g/dL — AB (ref 12.0–15.0)
Immature Granulocytes: 0 %
LYMPHS PCT: 28 %
Lymphs Abs: 2.4 10*3/uL (ref 0.7–4.0)
MCH: 29.3 pg (ref 26.0–34.0)
MCHC: 30.6 g/dL (ref 30.0–36.0)
MCV: 95.7 fL (ref 80.0–100.0)
Monocytes Absolute: 0.8 10*3/uL (ref 0.1–1.0)
Monocytes Relative: 10 %
NEUTROS ABS: 4.9 10*3/uL (ref 1.7–7.7)
NEUTROS PCT: 60 %
NRBC: 0 % (ref 0.0–0.2)
PLATELETS: 309 10*3/uL (ref 150–400)
RBC: 3.99 MIL/uL (ref 3.87–5.11)
RDW: 14.9 % (ref 11.5–15.5)
WBC: 8.3 10*3/uL (ref 4.0–10.5)

## 2018-06-23 LAB — BASIC METABOLIC PANEL
ANION GAP: 6 (ref 5–15)
BUN: 11 mg/dL (ref 8–23)
CHLORIDE: 107 mmol/L (ref 98–111)
CO2: 27 mmol/L (ref 22–32)
Calcium: 8.7 mg/dL — ABNORMAL LOW (ref 8.9–10.3)
Creatinine, Ser: 1.03 mg/dL — ABNORMAL HIGH (ref 0.44–1.00)
GFR calc non Af Amer: 54 mL/min — ABNORMAL LOW (ref >60)
Glucose, Bld: 76 mg/dL (ref 70–99)
POTASSIUM: 3.5 mmol/L (ref 3.5–5.1)
Sodium: 140 mmol/L (ref 135–145)

## 2018-06-24 ENCOUNTER — Other Ambulatory Visit: Payer: Self-pay | Admitting: Neurosurgery

## 2018-06-24 DIAGNOSIS — I10 Essential (primary) hypertension: Secondary | ICD-10-CM | POA: Diagnosis not present

## 2018-06-24 DIAGNOSIS — I2609 Other pulmonary embolism with acute cor pulmonale: Secondary | ICD-10-CM | POA: Diagnosis not present

## 2018-06-24 DIAGNOSIS — J449 Chronic obstructive pulmonary disease, unspecified: Secondary | ICD-10-CM | POA: Diagnosis not present

## 2018-06-24 DIAGNOSIS — I671 Cerebral aneurysm, nonruptured: Secondary | ICD-10-CM

## 2018-06-24 DIAGNOSIS — R69 Illness, unspecified: Secondary | ICD-10-CM | POA: Diagnosis not present

## 2018-06-30 ENCOUNTER — Other Ambulatory Visit: Payer: Self-pay

## 2018-06-30 ENCOUNTER — Ambulatory Visit (HOSPITAL_COMMUNITY): Payer: Medicare HMO | Admitting: Anesthesiology

## 2018-06-30 ENCOUNTER — Encounter (HOSPITAL_COMMUNITY): Payer: Self-pay | Admitting: *Deleted

## 2018-06-30 ENCOUNTER — Encounter (HOSPITAL_COMMUNITY)
Admission: RE | Disposition: A | Discharge status: Home / Self Care | Payer: Self-pay | Source: Ambulatory Visit | Attending: Ophthalmology

## 2018-06-30 ENCOUNTER — Ambulatory Visit (HOSPITAL_COMMUNITY)
Admission: RE | Admit: 2018-06-30 | Discharge: 2018-06-30 | Disposition: A | Discharge status: Home / Self Care | Payer: Medicare HMO | Source: Ambulatory Visit | Attending: Ophthalmology | Admitting: Ophthalmology

## 2018-06-30 DIAGNOSIS — Z79899 Other long term (current) drug therapy: Secondary | ICD-10-CM | POA: Insufficient documentation

## 2018-06-30 DIAGNOSIS — R69 Illness, unspecified: Secondary | ICD-10-CM | POA: Diagnosis not present

## 2018-06-30 DIAGNOSIS — I252 Old myocardial infarction: Secondary | ICD-10-CM | POA: Insufficient documentation

## 2018-06-30 DIAGNOSIS — H25811 Combined forms of age-related cataract, right eye: Secondary | ICD-10-CM | POA: Insufficient documentation

## 2018-06-30 DIAGNOSIS — I2510 Atherosclerotic heart disease of native coronary artery without angina pectoris: Secondary | ICD-10-CM | POA: Diagnosis not present

## 2018-06-30 DIAGNOSIS — I10 Essential (primary) hypertension: Secondary | ICD-10-CM | POA: Diagnosis not present

## 2018-06-30 DIAGNOSIS — Z8673 Personal history of transient ischemic attack (TIA), and cerebral infarction without residual deficits: Secondary | ICD-10-CM | POA: Diagnosis not present

## 2018-06-30 DIAGNOSIS — H25812 Combined forms of age-related cataract, left eye: Secondary | ICD-10-CM | POA: Diagnosis not present

## 2018-06-30 DIAGNOSIS — F17200 Nicotine dependence, unspecified, uncomplicated: Secondary | ICD-10-CM | POA: Insufficient documentation

## 2018-06-30 HISTORY — PX: CATARACT EXTRACTION W/PHACO: SHX586

## 2018-06-30 SURGERY — PHACOEMULSIFICATION, CATARACT, WITH IOL INSERTION
Anesthesia: Monitor Anesthesia Care | Site: Eye | Laterality: Right

## 2018-06-30 MED ORDER — LACTATED RINGERS IV SOLN
INTRAVENOUS | Status: DC
  Administered 2018-06-30: 08:00:00 via INTRAVENOUS

## 2018-06-30 MED ORDER — LIDOCAINE HCL (PF) 1 % IJ SOLN
INTRAMUSCULAR | Status: DC | PRN
  Administered 2018-06-30: .5 mL

## 2018-06-30 MED ORDER — NEOMYCIN-POLYMYXIN-DEXAMETH 3.5-10000-0.1 OP SUSP
OPHTHALMIC | Status: DC | PRN
  Administered 2018-06-30: 2 [drp] via OPHTHALMIC

## 2018-06-30 MED ORDER — BSS IO SOLN
INTRAOCULAR | Status: DC | PRN
  Administered 2018-06-30: 15 mL via INTRAOCULAR

## 2018-06-30 MED ORDER — MIDAZOLAM HCL 2 MG/2ML IJ SOLN
INTRAMUSCULAR | Status: AC
  Filled 2018-06-30: qty 2

## 2018-06-30 MED ORDER — TETRACAINE HCL 0.5 % OP SOLN
1.0000 [drp] | OPHTHALMIC | Status: AC
  Administered 2018-06-30 (×3): 1 [drp] via OPHTHALMIC

## 2018-06-30 MED ORDER — PHENYLEPHRINE HCL 2.5 % OP SOLN
1.0000 [drp] | OPHTHALMIC | Status: AC
  Administered 2018-06-30 (×3): 1 [drp] via OPHTHALMIC

## 2018-06-30 MED ORDER — POVIDONE-IODINE 5 % OP SOLN
OPHTHALMIC | Status: DC | PRN
  Administered 2018-06-30: 1 via OPHTHALMIC

## 2018-06-30 MED ORDER — MIDAZOLAM HCL 5 MG/5ML IJ SOLN
INTRAMUSCULAR | Status: DC | PRN
  Administered 2018-06-30: 2 mg via INTRAVENOUS

## 2018-06-30 MED ORDER — EPINEPHRINE PF 1 MG/ML IJ SOLN
INTRAOCULAR | Status: DC | PRN
  Administered 2018-06-30: 500 mL

## 2018-06-30 MED ORDER — LIDOCAINE HCL 3.5 % OP GEL
1.0000 "application " | Freq: Once | OPHTHALMIC | Status: AC
  Administered 2018-06-30: 1 via OPHTHALMIC

## 2018-06-30 MED ORDER — PROVISC 10 MG/ML IO SOLN
INTRAOCULAR | Status: DC | PRN
  Administered 2018-06-30: 0.85 mL via INTRAOCULAR

## 2018-06-30 MED ORDER — CYCLOPENTOLATE-PHENYLEPHRINE 0.2-1 % OP SOLN
1.0000 [drp] | OPHTHALMIC | Status: AC
  Administered 2018-06-30 (×3): 1 [drp] via OPHTHALMIC

## 2018-06-30 SURGICAL SUPPLY — 15 items
CLOTH BEACON ORANGE TIMEOUT ST (SAFETY) ×1 IMPLANT
EYE SHIELD UNIVERSAL CLEAR (GAUZE/BANDAGES/DRESSINGS) ×1 IMPLANT
GLOVE BIOGEL PI IND STRL 7.0 (GLOVE) IMPLANT
GLOVE BIOGEL PI INDICATOR 7.0 (GLOVE) ×2
KIT VITRECTOMY (OPHTHALMIC RELATED) IMPLANT
LENS ALC ACRYL/TECN (Ophthalmic Related) ×1 IMPLANT
NDL HYPO 18GX1.5 BLUNT FILL (NEEDLE) IMPLANT
NEEDLE HYPO 18GX1.5 BLUNT FILL (NEEDLE) ×2 IMPLANT
PAD ARMBOARD 7.5X6 YLW CONV (MISCELLANEOUS) ×1 IMPLANT
PROC W SPEC LENS (INTRAOCULAR LENS)
PROCESS W SPEC LENS (INTRAOCULAR LENS) IMPLANT
SYRINGE LUER LOK 1CC (MISCELLANEOUS) ×1 IMPLANT
TAPE SURG TRANSPORE 1 IN (GAUZE/BANDAGES/DRESSINGS) IMPLANT
TAPE SURGICAL TRANSPORE 1 IN (GAUZE/BANDAGES/DRESSINGS) ×1
WATER STERILE IRR 250ML POUR (IV SOLUTION) ×1 IMPLANT

## 2018-06-30 NOTE — Op Note (Signed)
Date of Admission: 06/30/2018  Date of Surgery: 06/30/2018  Pre-Op Dx: Cataract Right  Eye  Post-Op Dx: Senile Combined Cataract  Right  Eye,  Dx Code L37.342  Surgeon: Tonny Branch, M.D.  Assistants: None  Anesthesia: Topical with MAC  Indications: Painless, progressive loss of vision with compromise of daily activities.  Surgery: Cataract Extraction with Intraocular lens Implant Right Eye  Discription: The patient had dilating drops and viscous lidocaine placed into the Right eye in the pre-op holding area. After transfer to the operating room, a time out was performed. The patient was then prepped and draped. Beginning with a 45m blade a paracentesis port was made at the surgeon's 2 o'clock position. The anterior chamber was then filled with 1% non-preserved lidocaine. This was followed by filling the anterior chamber with Provisc.  A 2.435mkeratome blade was used to make a clear corneal incision at the temporal limbus.  A bent cystatome needle was used to create a continuous tear capsulotomy. Hydrodissection was performed with balanced salt solution on a Fine canula. The lens nucleus was then removed using the phacoemulsification handpiece. Residual cortex was removed with the I&A handpiece. The anterior chamber and capsular bag were refilled with Provisc. A posterior chamber intraocular lens was placed into the capsular bag with it's injector. The implant was positioned with the Kuglan hook. The Provisc was then removed from the anterior chamber and capsular bag with the I&A handpiece. Stromal hydration of the main incision and paracentesis port was performed with BSS on a Fine canula. The wounds were tested for leak which was negative. The patient tolerated the procedure well. There were no operative complications. The patient was then transferred to the recovery room in stable condition.  Complications: None  Specimen: None  EBL: None  Prosthetic device: J&J Technis, PCB00, power 15.0,  SN [id number]

## 2018-06-30 NOTE — H&P (Signed)
I have reviewed the H&P, the patient was re-examined, and I have identified no interval changes in medical condition and plan of care since the history and physical of record  

## 2018-06-30 NOTE — Anesthesia Preprocedure Evaluation (Signed)
Anesthesia Evaluation    Airway Mallampati: III       Dental  (+) Edentulous Upper, Edentulous Lower   Pulmonary Current Smoker,           Cardiovascular hypertension, + CAD, + Past MI and + Peripheral Vascular Disease       Neuro/Psych  Headaches, Seizures -,   Neuromuscular disease CVA    GI/Hepatic   Endo/Other    Renal/GU ARFRenal disease     Musculoskeletal   Abdominal   Peds  Hematology   Anesthesia Other Findings Ongoing tobacco abuse CVA with three aneurysm clippings 4/18  Reproductive/Obstetrics                             Anesthesia Physical Anesthesia Plan  ASA: IV  Anesthesia Plan: MAC   Post-op Pain Management:    Induction:   PONV Risk Score and Plan:   Airway Management Planned:   Additional Equipment:   Intra-op Plan:   Post-operative Plan:   Informed Consent:   Plan Discussed with: Anesthesiologist  Anesthesia Plan Comments:         Anesthesia Quick Evaluation

## 2018-06-30 NOTE — Discharge Instructions (Signed)

## 2018-06-30 NOTE — Anesthesia Procedure Notes (Signed)
Procedure Name: MAC Date/Time: 06/30/2018 8:34 AM Performed by: Andree Elk  A, CRNA Pre-anesthesia Checklist: Patient identified, Emergency Drugs available, Suction available, Timeout performed and Patient being monitored Patient Re-evaluated:Patient Re-evaluated prior to induction Oxygen Delivery Method: Nasal Cannula

## 2018-06-30 NOTE — Anesthesia Postprocedure Evaluation (Signed)
Anesthesia Post Note  Patient: Jennifer Jacobs  Procedure(s) Performed: CATARACT EXTRACTION PHACO AND INTRAOCULAR LENS PLACEMENT RIGHT EYE (Right Eye)  Patient location during evaluation: Short Stay Anesthesia Type: MAC Level of consciousness: awake and alert and oriented Pain management: pain level controlled Vital Signs Assessment: post-procedure vital signs reviewed and stable Respiratory status: spontaneous breathing Cardiovascular status: stable Postop Assessment: no apparent nausea or vomiting Anesthetic complications: no     Last Vitals:  Vitals:   06/30/18 0733 06/30/18 0745  BP: (!) 136/94 130/84  Pulse: (!) 58   Resp: 20 15  Temp: 37.2 C   SpO2: 100%     Last Pain:  Vitals:   06/30/18 0733  TempSrc: Oral  PainSc: 0-No pain                 [name]

## 2018-06-30 NOTE — Transfer of Care (Signed)
Immediate Anesthesia Transfer of Care Note  Patient: Jennifer Jacobs  Procedure(s) Performed: CATARACT EXTRACTION PHACO AND INTRAOCULAR LENS PLACEMENT RIGHT EYE (Right Eye)  Patient Location: Short Stay  Anesthesia Type:MAC  Level of Consciousness: awake, alert , oriented and patient cooperative  Airway & Oxygen Therapy: Patient Spontanous Breathing  Post-op Assessment: Report given to RN and Post -op Vital signs reviewed and stable  Post vital signs: Reviewed and stable  Last Vitals:  Vitals Value Taken Time  BP    Temp    Pulse    Resp    SpO2      Last Pain:  Vitals:   06/30/18 0733  TempSrc: Oral  PainSc: 0-No pain      Patients Stated Pain Goal: 6 (65/78/46 9629)  Complications: No apparent anesthesia complications

## 2018-07-01 ENCOUNTER — Encounter (HOSPITAL_COMMUNITY): Payer: Self-pay | Admitting: Ophthalmology

## 2018-07-07 DIAGNOSIS — H2512 Age-related nuclear cataract, left eye: Secondary | ICD-10-CM | POA: Diagnosis not present

## 2018-07-10 ENCOUNTER — Encounter (HOSPITAL_COMMUNITY)
Admission: RE | Admit: 2018-07-10 | Discharge: 2018-07-10 | Disposition: A | Discharge status: Home / Self Care | Payer: Medicare HMO | Source: Ambulatory Visit | Attending: Ophthalmology | Admitting: Ophthalmology

## 2018-07-14 ENCOUNTER — Ambulatory Visit (HOSPITAL_COMMUNITY)
Admission: RE | Admit: 2018-07-14 | Discharge: 2018-07-14 | Disposition: A | Discharge status: Home / Self Care | Payer: Medicare HMO | Source: Ambulatory Visit | Attending: Ophthalmology | Admitting: Ophthalmology

## 2018-07-14 ENCOUNTER — Encounter (HOSPITAL_COMMUNITY): Payer: Self-pay

## 2018-07-14 ENCOUNTER — Encounter (HOSPITAL_COMMUNITY)
Admission: RE | Disposition: A | Discharge status: Home / Self Care | Payer: Self-pay | Source: Ambulatory Visit | Attending: Ophthalmology

## 2018-07-14 ENCOUNTER — Ambulatory Visit (HOSPITAL_COMMUNITY): Payer: Medicare HMO | Admitting: Certified Registered Nurse Anesthetist

## 2018-07-14 DIAGNOSIS — R69 Illness, unspecified: Secondary | ICD-10-CM | POA: Diagnosis not present

## 2018-07-14 DIAGNOSIS — D649 Anemia, unspecified: Secondary | ICD-10-CM | POA: Insufficient documentation

## 2018-07-14 DIAGNOSIS — Z886 Allergy status to analgesic agent status: Secondary | ICD-10-CM | POA: Insufficient documentation

## 2018-07-14 DIAGNOSIS — Z8673 Personal history of transient ischemic attack (TIA), and cerebral infarction without residual deficits: Secondary | ICD-10-CM | POA: Insufficient documentation

## 2018-07-14 DIAGNOSIS — I10 Essential (primary) hypertension: Secondary | ICD-10-CM | POA: Insufficient documentation

## 2018-07-14 DIAGNOSIS — F17200 Nicotine dependence, unspecified, uncomplicated: Secondary | ICD-10-CM | POA: Diagnosis not present

## 2018-07-14 DIAGNOSIS — F419 Anxiety disorder, unspecified: Secondary | ICD-10-CM | POA: Insufficient documentation

## 2018-07-14 DIAGNOSIS — R51 Headache: Secondary | ICD-10-CM | POA: Diagnosis not present

## 2018-07-14 DIAGNOSIS — H2512 Age-related nuclear cataract, left eye: Secondary | ICD-10-CM | POA: Insufficient documentation

## 2018-07-14 DIAGNOSIS — R569 Unspecified convulsions: Secondary | ICD-10-CM | POA: Insufficient documentation

## 2018-07-14 DIAGNOSIS — I252 Old myocardial infarction: Secondary | ICD-10-CM | POA: Diagnosis not present

## 2018-07-14 DIAGNOSIS — F329 Major depressive disorder, single episode, unspecified: Secondary | ICD-10-CM | POA: Insufficient documentation

## 2018-07-14 DIAGNOSIS — I739 Peripheral vascular disease, unspecified: Secondary | ICD-10-CM | POA: Insufficient documentation

## 2018-07-14 DIAGNOSIS — I2510 Atherosclerotic heart disease of native coronary artery without angina pectoris: Secondary | ICD-10-CM | POA: Insufficient documentation

## 2018-07-14 HISTORY — PX: CATARACT EXTRACTION W/PHACO: SHX586

## 2018-07-14 SURGERY — PHACOEMULSIFICATION, CATARACT, WITH IOL INSERTION
Anesthesia: Monitor Anesthesia Care | Site: Eye | Laterality: Left

## 2018-07-14 MED ORDER — EPINEPHRINE PF 1 MG/ML IJ SOLN
INTRAOCULAR | Status: DC | PRN
  Administered 2018-07-14: 500 mL

## 2018-07-14 MED ORDER — NEOMYCIN-POLYMYXIN-DEXAMETH 3.5-10000-0.1 OP SUSP
OPHTHALMIC | Status: DC | PRN
  Administered 2018-07-14: 2 [drp] via OPHTHALMIC

## 2018-07-14 MED ORDER — LACTATED RINGERS IV SOLN
INTRAVENOUS | Status: DC
  Administered 2018-07-14 (×2): via INTRAVENOUS

## 2018-07-14 MED ORDER — PROVISC 10 MG/ML IO SOLN
INTRAOCULAR | Status: DC | PRN
  Administered 2018-07-14: 0.85 mL via INTRAOCULAR

## 2018-07-14 MED ORDER — BSS IO SOLN
INTRAOCULAR | Status: DC | PRN
  Administered 2018-07-14: 15 mL

## 2018-07-14 MED ORDER — CYCLOPENTOLATE-PHENYLEPHRINE 0.2-1 % OP SOLN
1.0000 [drp] | OPHTHALMIC | Status: AC
  Administered 2018-07-14 (×2): 1 [drp] via OPHTHALMIC

## 2018-07-14 MED ORDER — LIDOCAINE HCL 3.5 % OP GEL
1.0000 "application " | Freq: Once | OPHTHALMIC | Status: AC
  Administered 2018-07-14: 1 via OPHTHALMIC

## 2018-07-14 MED ORDER — POVIDONE-IODINE 5 % OP SOLN
OPHTHALMIC | Status: DC | PRN
  Administered 2018-07-14: 1 via OPHTHALMIC

## 2018-07-14 MED ORDER — MIDAZOLAM HCL 5 MG/5ML IJ SOLN
INTRAMUSCULAR | Status: DC | PRN
  Administered 2018-07-14: 2 mg via INTRAVENOUS

## 2018-07-14 MED ORDER — PHENYLEPHRINE HCL 2.5 % OP SOLN
1.0000 [drp] | OPHTHALMIC | Status: AC
  Administered 2018-07-14 (×2): 1 [drp] via OPHTHALMIC

## 2018-07-14 MED ORDER — MIDAZOLAM HCL 2 MG/2ML IJ SOLN
INTRAMUSCULAR | Status: AC
  Filled 2018-07-14: qty 2

## 2018-07-14 MED ORDER — TETRACAINE HCL 0.5 % OP SOLN
1.0000 [drp] | OPHTHALMIC | Status: AC
  Administered 2018-07-14 (×2): 1 [drp] via OPHTHALMIC

## 2018-07-14 MED ORDER — LIDOCAINE HCL (PF) 1 % IJ SOLN
INTRAOCULAR | Status: DC | PRN
  Administered 2018-07-14: .8 mL via OPHTHALMIC

## 2018-07-14 SURGICAL SUPPLY — 10 items
CLOTH BEACON ORANGE TIMEOUT ST (SAFETY) ×1 IMPLANT
EYE SHIELD UNIVERSAL CLEAR (GAUZE/BANDAGES/DRESSINGS) ×1 IMPLANT
GLOVE BIOGEL PI IND STRL 6.5 (GLOVE) IMPLANT
GLOVE BIOGEL PI INDICATOR 6.5 (GLOVE) ×2
LENS ALC ACRYL/TECN (Ophthalmic Related) ×1 IMPLANT
PAD ARMBOARD 7.5X6 YLW CONV (MISCELLANEOUS) ×1 IMPLANT
SYRINGE LUER LOK 1CC (MISCELLANEOUS) ×1 IMPLANT
TAPE SURG TRANSPORE 1 IN (GAUZE/BANDAGES/DRESSINGS) IMPLANT
TAPE SURGICAL TRANSPORE 1 IN (GAUZE/BANDAGES/DRESSINGS) ×1
WATER STERILE IRR 250ML POUR (IV SOLUTION) ×1 IMPLANT

## 2018-07-14 NOTE — Op Note (Signed)
Date of Admission: 07/14/2018  Date of Surgery: 07/14/2018  Pre-Op Dx: Cataract Left  Eye  Post-Op Dx: Senile Nuclear Cataract  Left  Eye,  Dx Code H25.12  Surgeon: Tonny Branch, M.D.  Assistants: None  Anesthesia: Topical with MAC  Indications: Painless, progressive loss of vision with compromise of daily activities.  Surgery: Cataract Extraction with Intraocular lens Implant Left Eye  Discription: The patient had dilating drops and viscous lidocaine placed into the Left eye in the pre-op holding area. After transfer to the operating room, a time out was performed. The patient was then prepped and draped. Beginning with a 86m blade a paracentesis port was made at the surgeon's 2 o'clock position. The anterior chamber was then filled with 1% non-preserved lidocaine. This was followed by filling the anterior chamber with Provisc.  A 2.485mkeratome blade was used to make a clear corneal incision at the temporal limbus.  A bent cystatome needle was used to create a continuous tear capsulotomy. Hydrodissection was performed with balanced salt solution on a Fine canula. The lens nucleus was then removed using the phacoemulsification handpiece. Residual cortex was removed with the I&A handpiece. The anterior chamber and capsular bag were refilled with Provisc. A posterior chamber intraocular lens was placed into the capsular bag with it's injector. The implant was positioned with the Kuglan hook. The Provisc was then removed from the anterior chamber and capsular bag with the I&A handpiece. Stromal hydration of the main incision and paracentesis port was performed with BSS on a Fine canula. The wounds were tested for leak which was negative. The patient tolerated the procedure well. There were no operative complications. The patient was then transferred to the recovery room in stable condition.  Complications: None  Specimen: None  EBL: None  Prosthetic device: J&J Technis, PCB00, power 16.0, SN  [id number]

## 2018-07-14 NOTE — H&P (Signed)
I have reviewed the H&P, the patient was re-examined, and I have identified no interval changes in medical condition and plan of care since the history and physical of record  

## 2018-07-14 NOTE — Transfer of Care (Signed)
Immediate Anesthesia Transfer of Care Note  Patient: Jennifer Jacobs  Procedure(s) Performed: CATARACT EXTRACTION PHACO AND INTRAOCULAR LENS PLACEMENT ([place]) (Left Eye)  Patient Location: PACU  Anesthesia Type:MAC  Level of Consciousness: awake, alert  and oriented  Airway & Oxygen Therapy: Patient Spontanous Breathing  Post-op Assessment: Report given to RN and Post -op Vital signs reviewed and stable  Post vital signs: Reviewed and stable  Last Vitals:  Vitals Value Taken Time  BP 129/64 07/14/2018  2:08 PM  Temp 36.6 C 07/14/2018  2:08 PM  Pulse 57 07/14/2018  2:08 PM  Resp 16 07/14/2018  2:08 PM  SpO2 93 % 07/14/2018  2:08 PM    Last Pain:  Vitals:   07/14/18 1408  TempSrc: Oral  PainSc:       Patients Stated Pain Goal: 6 (20/94/70 9628)  Complications: No apparent anesthesia complications

## 2018-07-14 NOTE — Anesthesia Postprocedure Evaluation (Signed)
Anesthesia Post Note  Patient: Jennifer Jacobs  Procedure(s) Performed: CATARACT EXTRACTION PHACO AND INTRAOCULAR LENS PLACEMENT ([place]) (Left Eye)  Patient location during evaluation: PACU Anesthesia Type: MAC Level of consciousness: awake and alert and oriented Pain management: pain level controlled Vital Signs Assessment: post-procedure vital signs reviewed and stable Respiratory status: spontaneous breathing and respiratory function stable Cardiovascular status: blood pressure returned to baseline and stable Postop Assessment: no headache Anesthetic complications: no     Last Vitals:  Vitals:   07/14/18 1335 07/14/18 1408  BP: (!) 134/95 129/64  Pulse: 60 (!) 57  Resp:  16  Temp: 36.5 C 36.6 C  SpO2: 97% 93%    Last Pain:  Vitals:   07/14/18 1408  TempSrc: Oral  PainSc:                  Singleton Hickox C [place]

## 2018-07-14 NOTE — Discharge Instructions (Signed)

## 2018-07-14 NOTE — Anesthesia Preprocedure Evaluation (Signed)
Anesthesia Evaluation  Patient identified by MRN, date of birth, ID band Patient awake    Reviewed: Allergy & Precautions, H&P , NPO status , Patient's Chart, lab work & pertinent test results  Airway Mallampati: III  TM Distance: >3 FB Neck ROM: full    Dental no notable dental hx. (+) Edentulous Upper, Edentulous Lower   Pulmonary neg pulmonary ROS, Current Smoker,    Pulmonary exam normal breath sounds clear to auscultation       Cardiovascular Exercise Tolerance: Good hypertension, + CAD, + Past MI and + Peripheral Vascular Disease  negative cardio ROS   Rhythm:regular Rate:Normal     Neuro/Psych  Headaches, Seizures -,  PSYCHIATRIC DISORDERS Anxiety Depression  Neuromuscular disease CVA negative neurological ROS     GI/Hepatic negative GI ROS, Neg liver ROS,   Endo/Other  negative endocrine ROS  Renal/GU ARFRenal diseasenegative Renal ROS  negative genitourinary   Musculoskeletal   Abdominal   Peds  Hematology negative hematology ROS (+) Blood dyscrasia, anemia ,   Anesthesia Other Findings Ongoing tobacco abuse 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 LV FXN  Reproductive/Obstetrics negative OB ROS                             Anesthesia Physical  Anesthesia Plan  ASA: IV  Anesthesia Plan: MAC   Post-op Pain Management:    Induction:   PONV Risk Score and Plan:   Airway Management Planned:   Additional Equipment:   Intra-op Plan:   Post-operative Plan:   Informed Consent: I have reviewed the patients History and Physical, chart, labs and discussed the procedure including the risks, benefits and alternatives for the proposed anesthesia with the patient or authorized representative who has indicated his/her understanding and acceptance.   Dental Advisory Given  Plan Discussed with: Anesthesiologist  Anesthesia Plan Comments:         Anesthesia Quick  Evaluation

## 2018-07-15 ENCOUNTER — Encounter (HOSPITAL_COMMUNITY): Payer: Self-pay | Admitting: Ophthalmology

## 2018-07-15 NOTE — Addendum Note (Signed)
Addendum  created 07/15/18 0939 by Franco NonesYates, Brittanya Winburn S, CRNA   Charge Capture section accepted

## 2018-12-01 DIAGNOSIS — Z0000 Encounter for general adult medical examination without abnormal findings: Secondary | ICD-10-CM | POA: Diagnosis not present

## 2018-12-04 ENCOUNTER — Other Ambulatory Visit: Payer: Self-pay | Admitting: Cardiology

## 2018-12-04 DIAGNOSIS — I2699 Other pulmonary embolism without acute cor pulmonale: Secondary | ICD-10-CM | POA: Diagnosis not present

## 2018-12-04 DIAGNOSIS — Z8673 Personal history of transient ischemic attack (TIA), and cerebral infarction without residual deficits: Secondary | ICD-10-CM | POA: Diagnosis not present

## 2018-12-04 DIAGNOSIS — E782 Mixed hyperlipidemia: Secondary | ICD-10-CM | POA: Diagnosis not present

## 2018-12-04 DIAGNOSIS — I10 Essential (primary) hypertension: Secondary | ICD-10-CM | POA: Diagnosis not present

## 2018-12-04 DIAGNOSIS — Z86711 Personal history of pulmonary embolism: Secondary | ICD-10-CM | POA: Diagnosis not present

## 2018-12-04 DIAGNOSIS — I639 Cerebral infarction, unspecified: Secondary | ICD-10-CM | POA: Diagnosis not present

## 2018-12-04 DIAGNOSIS — R69 Illness, unspecified: Secondary | ICD-10-CM | POA: Diagnosis not present

## 2018-12-04 DIAGNOSIS — G4089 Other seizures: Secondary | ICD-10-CM | POA: Diagnosis not present

## 2018-12-22 DIAGNOSIS — E782 Mixed hyperlipidemia: Secondary | ICD-10-CM | POA: Diagnosis not present

## 2018-12-22 DIAGNOSIS — I10 Essential (primary) hypertension: Secondary | ICD-10-CM | POA: Diagnosis not present

## 2018-12-22 DIAGNOSIS — F3181 Bipolar II disorder: Secondary | ICD-10-CM | POA: Diagnosis not present

## 2018-12-22 DIAGNOSIS — I2699 Other pulmonary embolism without acute cor pulmonale: Secondary | ICD-10-CM | POA: Diagnosis not present

## 2019-01-07 ENCOUNTER — Other Ambulatory Visit: Payer: Self-pay | Admitting: Cardiology

## 2019-01-15 ENCOUNTER — Other Ambulatory Visit: Payer: Self-pay | Admitting: Internal Medicine

## 2019-01-15 DIAGNOSIS — I10 Essential (primary) hypertension: Secondary | ICD-10-CM | POA: Diagnosis not present

## 2019-01-15 DIAGNOSIS — R0602 Shortness of breath: Secondary | ICD-10-CM

## 2019-01-15 DIAGNOSIS — G9009 Other idiopathic peripheral autonomic neuropathy: Secondary | ICD-10-CM | POA: Diagnosis not present

## 2019-01-15 DIAGNOSIS — I639 Cerebral infarction, unspecified: Secondary | ICD-10-CM | POA: Diagnosis not present

## 2019-01-15 DIAGNOSIS — G4700 Insomnia, unspecified: Secondary | ICD-10-CM | POA: Diagnosis not present

## 2019-01-15 DIAGNOSIS — Z6830 Body mass index (BMI) 30.0-30.9, adult: Secondary | ICD-10-CM | POA: Diagnosis not present

## 2019-01-15 DIAGNOSIS — G4089 Other seizures: Secondary | ICD-10-CM | POA: Diagnosis not present

## 2019-01-15 DIAGNOSIS — R600 Localized edema: Secondary | ICD-10-CM | POA: Diagnosis not present

## 2019-01-15 DIAGNOSIS — I2699 Other pulmonary embolism without acute cor pulmonale: Secondary | ICD-10-CM | POA: Diagnosis not present

## 2019-01-15 DIAGNOSIS — E782 Mixed hyperlipidemia: Secondary | ICD-10-CM | POA: Diagnosis not present

## 2019-01-15 DIAGNOSIS — N3281 Overactive bladder: Secondary | ICD-10-CM | POA: Diagnosis not present

## 2019-01-15 DIAGNOSIS — M79606 Pain in leg, unspecified: Secondary | ICD-10-CM | POA: Diagnosis not present

## 2019-01-15 DIAGNOSIS — Z86711 Personal history of pulmonary embolism: Secondary | ICD-10-CM

## 2019-01-15 DIAGNOSIS — F3181 Bipolar II disorder: Secondary | ICD-10-CM | POA: Diagnosis not present

## 2019-01-22 DIAGNOSIS — Z86711 Personal history of pulmonary embolism: Secondary | ICD-10-CM | POA: Diagnosis not present

## 2019-01-22 DIAGNOSIS — R7301 Impaired fasting glucose: Secondary | ICD-10-CM | POA: Diagnosis not present

## 2019-01-22 DIAGNOSIS — R944 Abnormal results of kidney function studies: Secondary | ICD-10-CM | POA: Diagnosis not present

## 2019-01-22 DIAGNOSIS — R5383 Other fatigue: Secondary | ICD-10-CM | POA: Diagnosis not present

## 2019-01-22 DIAGNOSIS — N3281 Overactive bladder: Secondary | ICD-10-CM | POA: Diagnosis not present

## 2019-01-22 DIAGNOSIS — R600 Localized edema: Secondary | ICD-10-CM | POA: Diagnosis not present

## 2019-02-10 DIAGNOSIS — F3181 Bipolar II disorder: Secondary | ICD-10-CM | POA: Diagnosis not present

## 2019-02-10 DIAGNOSIS — I2699 Other pulmonary embolism without acute cor pulmonale: Secondary | ICD-10-CM | POA: Diagnosis not present

## 2019-02-10 DIAGNOSIS — E782 Mixed hyperlipidemia: Secondary | ICD-10-CM | POA: Diagnosis not present

## 2019-02-10 DIAGNOSIS — I10 Essential (primary) hypertension: Secondary | ICD-10-CM | POA: Diagnosis not present

## 2019-03-06 DIAGNOSIS — F3181 Bipolar II disorder: Secondary | ICD-10-CM | POA: Diagnosis not present

## 2019-03-06 DIAGNOSIS — I2699 Other pulmonary embolism without acute cor pulmonale: Secondary | ICD-10-CM | POA: Diagnosis not present

## 2019-03-06 DIAGNOSIS — I10 Essential (primary) hypertension: Secondary | ICD-10-CM | POA: Diagnosis not present

## 2019-03-06 DIAGNOSIS — E782 Mixed hyperlipidemia: Secondary | ICD-10-CM | POA: Diagnosis not present

## 2019-03-09 ENCOUNTER — Other Ambulatory Visit: Payer: Self-pay | Admitting: Internal Medicine

## 2019-03-18 DIAGNOSIS — M545 Low back pain: Secondary | ICD-10-CM | POA: Diagnosis not present

## 2019-03-18 DIAGNOSIS — M79606 Pain in leg, unspecified: Secondary | ICD-10-CM | POA: Diagnosis not present

## 2019-03-25 ENCOUNTER — Encounter (HOSPITAL_COMMUNITY): Payer: Self-pay

## 2019-03-25 ENCOUNTER — Ambulatory Visit (HOSPITAL_COMMUNITY): Payer: Medicare HMO

## 2019-04-09 DIAGNOSIS — Z86711 Personal history of pulmonary embolism: Secondary | ICD-10-CM | POA: Diagnosis not present

## 2019-04-09 DIAGNOSIS — I10 Essential (primary) hypertension: Secondary | ICD-10-CM | POA: Diagnosis not present

## 2019-04-09 DIAGNOSIS — G4089 Other seizures: Secondary | ICD-10-CM | POA: Diagnosis not present

## 2019-04-09 DIAGNOSIS — F3181 Bipolar II disorder: Secondary | ICD-10-CM | POA: Diagnosis not present

## 2019-04-09 DIAGNOSIS — E782 Mixed hyperlipidemia: Secondary | ICD-10-CM | POA: Diagnosis not present

## 2019-04-09 DIAGNOSIS — I2699 Other pulmonary embolism without acute cor pulmonale: Secondary | ICD-10-CM | POA: Diagnosis not present

## 2019-04-09 DIAGNOSIS — Z8673 Personal history of transient ischemic attack (TIA), and cerebral infarction without residual deficits: Secondary | ICD-10-CM | POA: Diagnosis not present

## 2019-04-09 DIAGNOSIS — I639 Cerebral infarction, unspecified: Secondary | ICD-10-CM | POA: Diagnosis not present

## 2019-05-08 DIAGNOSIS — I2699 Other pulmonary embolism without acute cor pulmonale: Secondary | ICD-10-CM | POA: Diagnosis not present

## 2019-05-08 DIAGNOSIS — F3181 Bipolar II disorder: Secondary | ICD-10-CM | POA: Diagnosis not present

## 2019-05-08 DIAGNOSIS — G4089 Other seizures: Secondary | ICD-10-CM | POA: Diagnosis not present

## 2019-05-08 DIAGNOSIS — I10 Essential (primary) hypertension: Secondary | ICD-10-CM | POA: Diagnosis not present

## 2019-05-08 DIAGNOSIS — E782 Mixed hyperlipidemia: Secondary | ICD-10-CM | POA: Diagnosis not present

## 2019-05-08 DIAGNOSIS — Z8673 Personal history of transient ischemic attack (TIA), and cerebral infarction without residual deficits: Secondary | ICD-10-CM | POA: Diagnosis not present

## 2019-05-08 DIAGNOSIS — I639 Cerebral infarction, unspecified: Secondary | ICD-10-CM | POA: Diagnosis not present

## 2019-05-08 DIAGNOSIS — Z86711 Personal history of pulmonary embolism: Secondary | ICD-10-CM | POA: Diagnosis not present

## 2019-05-11 DIAGNOSIS — R7301 Impaired fasting glucose: Secondary | ICD-10-CM | POA: Diagnosis not present

## 2019-05-11 DIAGNOSIS — E782 Mixed hyperlipidemia: Secondary | ICD-10-CM | POA: Diagnosis not present

## 2019-05-11 DIAGNOSIS — R944 Abnormal results of kidney function studies: Secondary | ICD-10-CM | POA: Diagnosis not present

## 2019-05-11 DIAGNOSIS — I10 Essential (primary) hypertension: Secondary | ICD-10-CM | POA: Diagnosis not present

## 2019-05-11 DIAGNOSIS — Z1321 Encounter for screening for nutritional disorder: Secondary | ICD-10-CM | POA: Diagnosis not present

## 2019-05-14 DIAGNOSIS — R7301 Impaired fasting glucose: Secondary | ICD-10-CM | POA: Diagnosis not present

## 2019-05-14 DIAGNOSIS — E782 Mixed hyperlipidemia: Secondary | ICD-10-CM | POA: Diagnosis not present

## 2019-05-14 DIAGNOSIS — N3281 Overactive bladder: Secondary | ICD-10-CM | POA: Diagnosis not present

## 2019-05-14 DIAGNOSIS — N182 Chronic kidney disease, stage 2 (mild): Secondary | ICD-10-CM | POA: Diagnosis not present

## 2019-05-14 DIAGNOSIS — R600 Localized edema: Secondary | ICD-10-CM | POA: Diagnosis not present

## 2019-05-21 DIAGNOSIS — I2699 Other pulmonary embolism without acute cor pulmonale: Secondary | ICD-10-CM | POA: Diagnosis not present

## 2019-05-21 DIAGNOSIS — Z86711 Personal history of pulmonary embolism: Secondary | ICD-10-CM | POA: Diagnosis not present

## 2019-05-21 DIAGNOSIS — E782 Mixed hyperlipidemia: Secondary | ICD-10-CM | POA: Diagnosis not present

## 2019-05-21 DIAGNOSIS — I639 Cerebral infarction, unspecified: Secondary | ICD-10-CM | POA: Diagnosis not present

## 2019-05-21 DIAGNOSIS — G4089 Other seizures: Secondary | ICD-10-CM | POA: Diagnosis not present

## 2019-05-21 DIAGNOSIS — Z8673 Personal history of transient ischemic attack (TIA), and cerebral infarction without residual deficits: Secondary | ICD-10-CM | POA: Diagnosis not present

## 2019-05-21 DIAGNOSIS — F3181 Bipolar II disorder: Secondary | ICD-10-CM | POA: Diagnosis not present

## 2019-05-21 DIAGNOSIS — I10 Essential (primary) hypertension: Secondary | ICD-10-CM | POA: Diagnosis not present

## 2019-06-08 ENCOUNTER — Emergency Department: Payer: Medicare HMO

## 2019-06-08 ENCOUNTER — Encounter: Payer: Self-pay | Admitting: Emergency Medicine

## 2019-06-08 ENCOUNTER — Emergency Department
Admission: EM | Admit: 2019-06-08 | Discharge: 2019-06-08 | Disposition: A | Discharge status: Home / Self Care | Payer: Medicare HMO | Attending: Emergency Medicine | Admitting: Emergency Medicine

## 2019-06-08 ENCOUNTER — Other Ambulatory Visit: Payer: Self-pay

## 2019-06-08 DIAGNOSIS — M25561 Pain in right knee: Secondary | ICD-10-CM | POA: Diagnosis not present

## 2019-06-08 DIAGNOSIS — Y939 Activity, unspecified: Secondary | ICD-10-CM | POA: Insufficient documentation

## 2019-06-08 DIAGNOSIS — Y92008 Other place in unspecified non-institutional (private) residence as the place of occurrence of the external cause: Secondary | ICD-10-CM | POA: Diagnosis not present

## 2019-06-08 DIAGNOSIS — S82831A Other fracture of upper and lower end of right fibula, initial encounter for closed fracture: Secondary | ICD-10-CM | POA: Diagnosis not present

## 2019-06-08 DIAGNOSIS — J449 Chronic obstructive pulmonary disease, unspecified: Secondary | ICD-10-CM | POA: Insufficient documentation

## 2019-06-08 DIAGNOSIS — N3001 Acute cystitis with hematuria: Secondary | ICD-10-CM

## 2019-06-08 DIAGNOSIS — S79911A Unspecified injury of right hip, initial encounter: Secondary | ICD-10-CM | POA: Diagnosis not present

## 2019-06-08 DIAGNOSIS — Y999 Unspecified external cause status: Secondary | ICD-10-CM | POA: Diagnosis not present

## 2019-06-08 DIAGNOSIS — S99911A Unspecified injury of right ankle, initial encounter: Secondary | ICD-10-CM | POA: Diagnosis not present

## 2019-06-08 DIAGNOSIS — R52 Pain, unspecified: Secondary | ICD-10-CM | POA: Diagnosis not present

## 2019-06-08 DIAGNOSIS — M7989 Other specified soft tissue disorders: Secondary | ICD-10-CM | POA: Diagnosis not present

## 2019-06-08 DIAGNOSIS — S92321A Displaced fracture of second metatarsal bone, right foot, initial encounter for closed fracture: Secondary | ICD-10-CM | POA: Diagnosis not present

## 2019-06-08 DIAGNOSIS — R55 Syncope and collapse: Secondary | ICD-10-CM

## 2019-06-08 DIAGNOSIS — F17210 Nicotine dependence, cigarettes, uncomplicated: Secondary | ICD-10-CM | POA: Insufficient documentation

## 2019-06-08 DIAGNOSIS — W010XXA Fall on same level from slipping, tripping and stumbling without subsequent striking against object, initial encounter: Secondary | ICD-10-CM | POA: Diagnosis not present

## 2019-06-08 DIAGNOSIS — S92301A Fracture of unspecified metatarsal bone(s), right foot, initial encounter for closed fracture: Secondary | ICD-10-CM

## 2019-06-08 DIAGNOSIS — M25571 Pain in right ankle and joints of right foot: Secondary | ICD-10-CM | POA: Diagnosis not present

## 2019-06-08 DIAGNOSIS — I10 Essential (primary) hypertension: Secondary | ICD-10-CM | POA: Diagnosis not present

## 2019-06-08 DIAGNOSIS — W19XXXA Unspecified fall, initial encounter: Secondary | ICD-10-CM | POA: Diagnosis not present

## 2019-06-08 DIAGNOSIS — M25551 Pain in right hip: Secondary | ICD-10-CM | POA: Diagnosis not present

## 2019-06-08 HISTORY — DX: Chronic obstructive pulmonary disease, unspecified: J44.9

## 2019-06-08 LAB — URINALYSIS, COMPLETE (UACMP) WITH MICROSCOPIC
Bilirubin Urine: NEGATIVE
Glucose, UA: NEGATIVE mg/dL
Ketones, ur: NEGATIVE mg/dL
Nitrite: NEGATIVE
Protein, ur: NEGATIVE mg/dL
Specific Gravity, Urine: 1.014 (ref 1.005–1.030)
pH: 5 (ref 5.0–8.0)

## 2019-06-08 LAB — BASIC METABOLIC PANEL
Anion gap: 7 (ref 5–15)
BUN: 13 mg/dL (ref 8–23)
CO2: 32 mmol/L (ref 22–32)
Calcium: 9.8 mg/dL (ref 8.9–10.3)
Chloride: 101 mmol/L (ref 98–111)
Creatinine, Ser: 1.26 mg/dL — ABNORMAL HIGH (ref 0.44–1.00)
GFR calc Af Amer: 50 mL/min — ABNORMAL LOW (ref >60)
GFR calc non Af Amer: 43 mL/min — ABNORMAL LOW (ref >60)
Glucose, Bld: 114 mg/dL — ABNORMAL HIGH (ref 70–99)
Potassium: 3.9 mmol/L (ref 3.5–5.1)
Sodium: 140 mmol/L (ref 135–145)

## 2019-06-08 LAB — CBC
HCT: 40.3 % (ref 36.0–46.0)
Hemoglobin: 13 g/dL (ref 12.0–15.0)
MCH: 30.1 pg (ref 26.0–34.0)
MCHC: 32.3 g/dL (ref 30.0–36.0)
MCV: 93.3 fL (ref 80.0–100.0)
Platelets: 272 10*3/uL (ref 150–400)
RBC: 4.32 MIL/uL (ref 3.87–5.11)
RDW: 15.1 % (ref 11.5–15.5)
WBC: 11.5 10*3/uL — ABNORMAL HIGH (ref 4.0–10.5)
nRBC: 0 % (ref 0.0–0.2)

## 2019-06-08 LAB — ETHANOL: Alcohol, Ethyl (B): <10 mg/dL (ref <10)

## 2019-06-08 LAB — TROPONIN I (HIGH SENSITIVITY): Troponin I (High Sensitivity): 3 ng/L (ref <18)

## 2019-06-08 MED ORDER — CEPHALEXIN 250 MG PO CAPS: 250.0000 mg | ORAL_CAPSULE | Freq: Four times a day (QID) | ORAL | 0 refills | Status: AC

## 2019-06-08 MED ORDER — TRAMADOL HCL 50 MG PO TABS: 50.0000 mg | ORAL_TABLET | Freq: Four times a day (QID) | ORAL | 0 refills | Status: AC | PRN

## 2019-06-08 MED ORDER — CEPHALEXIN 500 MG PO CAPS
500.0000 mg | ORAL_CAPSULE | Freq: Once | ORAL | Status: AC
  Administered 2019-06-08: 10:00:00 500 mg via ORAL
  Filled 2019-06-08: qty 1

## 2019-06-08 MED ORDER — OXYCODONE-ACETAMINOPHEN 5-325 MG PO TABS
2.0000 | ORAL_TABLET | Freq: Once | ORAL | Status: AC
  Administered 2019-06-08: 2 via ORAL
  Filled 2019-06-08: qty 2

## 2019-06-08 NOTE — ED Notes (Signed)
Pt now says she "blacked out"; remembers getting dizzy and then "everything went black"; "I could feel myself falling" but doesn't remember hitting the floor;

## 2019-06-08 NOTE — TOC Initial Note (Signed)
Transition of Care [facility]) - Initial/Assessment Note    Patient Details  Name: Jennifer Jacobs MRN: [id number] Date of Birth: 1946/07/18  [facility] CM/SW Contact:    Fredric Mare, LCSW Phone Number: 06/08/2019, 10:11 AM  Clinical Narrative:                 Patient is a 73 year old female that presents to the ED after a fall. Patient shared that she has had several falls since her "head surgery" two years ago. Patient states that she lives alone with her dog, and uses a cane to get around. Patient reports that she has several neighbors that assist her and watch her dog when she is not around.  Patient is open to going to SNF if physical therapy recommends it, but she is also open to home health services. Patient would prefer to go to SNF in [place], if possible.   CSW awaiting PT recommendations.     Expected Discharge Plan: Skilled Nursing Facility((OR HOME HEALTH)) Barriers to Discharge: Continued Medical Work up   Patient Goals and CMS Choice        Expected Discharge Plan and Services Expected Discharge Plan: Skilled Nursing Facility((OR HOME HEALTH))   Discharge Planning Services: CM Consult   Living arrangements for the past 2 months: Apartment                                      Prior Living Arrangements/Services Living arrangements for the past 2 months: Apartment Lives with:: Self Patient language and need for interpreter reviewed:: Yes Do you feel safe going back to the place where you live?: Yes      Need for Family Participation in Patient Care: No (Comment) Care giver support system in place?: No (comment) Current home services: DME(cane) Criminal Activity/Legal Involvement Pertinent to Current Situation/Hospitalization: No - Comment as needed  Activities of Daily Living      Permission Sought/Granted Permission sought to share information with : Chartered certified accountant granted to share information with :  Yes, Verbal Permission Granted              Emotional Assessment Appearance:: Appears stated age Attitude/Demeanor/Rapport: Engaged Affect (typically observed): Appropriate, Calm Orientation: : Oriented to Self, Oriented to Place, Oriented to  Time, Oriented to Situation Alcohol / Substance Use: Tobacco Use Psych Involvement: No (comment)  Admission diagnosis:  [facility] - Ankle pain Patient Active Problem List   Diagnosis Date Noted  . Acute pulmonary embolism ([place]) 01/13/2018  . Chest pain 01/11/2018  . Elevated troponin 01/11/2018  . AKI (acute kidney injury) ([place]) 01/11/2018  . NSTEMI (non-ST elevated myocardial infarction) ([place]) 01/11/2018  . Vascular headache   . Seizure prophylaxis   . Weakness of left third cranial nerve   . Gait disturbance, post-stroke 11/22/2016  . Benign essential HTN   . Seizure disorder ([place])   . Acute blood loss anemia   . 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  . Bilateral leg pain 06/26/2016  . AAA (abdominal aortic aneurysm) without rupture ([place]) 06/26/2016  . Insomnia 06/07/2015  . Urinary incontinence 06/07/2015  . Chronic pain syndrome 03/04/2015  . Fibromyalgia 03/04/2015  . Cervical radiculitis 03/04/2015  . Lumbar radicular pain 03/04/2015  . Major depression 07/05/2014  . Claudication, class I ([place]) 03/18/2013  . Smoker unmotivated to quit    PCP:  Celene Squibb, MD  Pharmacy:   [facility] [address] [place] - [address] [place] [address] [place] [address] [place] [address] Phone: [phone] Fax: [phone]  [facility] [address] - [place], [place] - [address] [place] [address] Phone: [phone] Fax: [phone]  [facility] - [address] ([place]), [place] - [address] [place] [address] Phone: [phone] Fax: [phone]     Social Determinants of Health (SDOH) Interventions     Readmission Risk Interventions No flowsheet data found.

## 2019-06-08 NOTE — TOC Progression Note (Addendum)
Transition of Care [facility]) - Progression Note    Patient Details  Name: Jennifer Jacobs MRN: [id number] Date of Birth: [birth year]  [facility] CM/SW Contact  Tania Sabrin Dunlevy, LCSW Phone Number: 06/08/2019, 12:39 PM  Clinical Narrative:     Patient no longer wants to stay for PT consult, and wants to leave. Patient would like to go home with home health.  EDP notified.   Expected Discharge Plan: Skilled Nursing Facility((OR HOME HEALTH)) Barriers to Discharge: Continued Medical Work up  Expected Discharge Plan and Services Expected Discharge Plan: Skilled Nursing Facility((OR [place]))   Discharge Planning Services: CM Consult   Living arrangements for the past 2 months: Apartment                                       Social Determinants of Health (SDOH) Interventions    Readmission Risk Interventions No flowsheet data found.

## 2019-06-08 NOTE — ED Notes (Signed)
Pt assisted to use bedpan at this time. Bed linens soiled. Pt cleaned and bed linen changed. Pt cleared by MD to eat and drink pt given coffee and warm blankets by this RN

## 2019-06-08 NOTE — ED Notes (Signed)
Post op shoe placed on pt right foot. Pt Rt knee wrapped with ace wrap.

## 2019-06-08 NOTE — ED Provider Notes (Signed)
[facility] Emergency Department Provider Note       Time seen: ----------------------------------------- 7:27 AM on 06/08/2019 -----------------------------------------   I have reviewed the triage vital signs and the nursing notes.  HISTORY   Chief Complaint Fall, Ankle Pain, Knee Pain, and Dizziness    HPI Jennifer Jacobs is a 73 y.o. female with a history of anemia, alcohol abuse, COPD, fibromyalgia, hypertension, hyperlipidemia, seizure disorder, neuromuscular disorder who presents to the ED for syncope.  Patient states she was walking to the back door at a friend's house when she got dizzy and fell to the floor, she twisted her right knee and ankle when she fell.  Patient states she cannot stand on the right leg since she fell.  She did have loss of consciousness which differs from the triage note.  She has passed out in the past.  Past Medical History:  Diagnosis Date  . Acute blood loss anemia   . AKI (acute kidney injury) ([place]) 01/11/2018  . Alcohol abuse 07/05/2014  . Allergy   . Anxiety   . Anxiety and depression   . Arthritis   . Cognitive deficit due to recent stroke 11/22/2016  . COPD (chronic obstructive pulmonary disease) ([place])   . Depression   . Dyslipidemia   . Fibromyalgia   . Hepatic cyst   . Hyperlipidemia   . Hypertension   . Leukocytosis   . Liver cyst 03/18/2014   01/26/2014 ultrasound Hypoechoic structure adjacent to gallbladder right lobe liver,  measuring 29 x 18 x 17 mm, possibly septated cyst    . MI (myocardial infarction) ([place]) 2007   By patient report, not substantiated by recent nuclear stress test.  . Neuromuscular disorder ([place])   . Seizure disorder ([place])    none since 2002  . Seizures ([place])   . Smoker unmotivated to quit    Quit for 3 years and then restarted  . Substance abuse ([place])    ETOH  . Vision abnormalities     Patient Active Problem List   Diagnosis Date Noted  . Acute pulmonary embolism ([place])  01/13/2018  . Chest pain 01/11/2018  . Elevated troponin 01/11/2018  . AKI (acute kidney injury) ([place]) 01/11/2018  . NSTEMI (non-ST elevated myocardial infarction) ([place]) 01/11/2018  . Vascular headache   . Seizure prophylaxis   . Weakness of left third cranial nerve   . Gait disturbance, post-stroke 11/22/2016  . Benign essential HTN   . Seizure disorder ([place])   . Acute blood loss anemia   . 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  . Bilateral leg pain 06/26/2016  . AAA (abdominal aortic aneurysm) without rupture ([place]) 06/26/2016  . Insomnia 06/07/2015  . Urinary incontinence 06/07/2015  . Chronic pain syndrome 03/04/2015  . Fibromyalgia 03/04/2015  . Cervical radiculitis 03/04/2015  . Lumbar radicular pain 03/04/2015  . Major depression 07/05/2014  . Claudication, class I ([place]) 03/18/2013  . Smoker unmotivated to quit     Past Surgical History:  Procedure Laterality Date  . CATARACT EXTRACTION W/PHACO Right 06/30/2018   Procedure: CATARACT EXTRACTION PHACO AND INTRAOCULAR LENS PLACEMENT RIGHT EYE;  Surgeon: Tonny Branch, MD;  Location: [facility];  Service: Ophthalmology;  Laterality: Right;  CDE: 9.90  . CATARACT EXTRACTION W/PHACO Left 07/14/2018   Procedure: CATARACT EXTRACTION PHACO AND INTRAOCULAR LENS PLACEMENT (IOC);  Surgeon: Tonny Branch, MD;  Location: [facility];  Service: Ophthalmology;  Laterality: Left;  CDE: 6.96  . CRANIOTOMY Left 11/19/2016   Procedure: CRANIOTOMY INTRACRANIAL FOR  ANEURYSM CLIPPING;  Surgeon: Kevan Ny Ditty, MD;  Location: [facility];  Service: Neurosurgery;  Laterality: Left;  . ELBOW ARTHROPLASTY Left    rods  . Exercise tolerance test  04/27/2013   CPET-MET: Submaximal effort (0.96, with goal of greater than 1.09) -- patient states that her effort was limited due to knee pain.  This makes the rest of the interpretation difficult: Peak VO2 - 10.5 (59% moderate), peak 02-pulse -  7..11 (low); heart rate 114 (73% -chronic incompetence  considered);; PFTs normal   . MOUTH SURGERY    . NM MYOVIEW LTD  05/19/2013   LexiScan: EF 80%, no evidence of ischemia or infarction  . TRANSTHORACIC ECHOCARDIOGRAM  03/25/2013   EF 55-60%, no regional wall motion abnormalities; normal diastolic parameters, normal pulmonary pressures.  No valvular lesions noted.  Essentially normal echo     Allergies Aspirin and Prozac [fluoxetine hcl]  Social History Social History   Tobacco Use  . Smoking status: Current Every Day Smoker    Packs/day: 0.50    Types: Cigarettes  . Smokeless tobacco: Never Used  . Tobacco comment: one pack every 3 months  Substance Use Topics  . Alcohol use: Yes    Comment: occasionally  . Drug use: Yes    Frequency: 2.0 times per week    Types: Marijuana    Comment: last smoked 1 week ago   Review of Systems Constitutional: Negative for fever. Cardiovascular: Negative for chest pain. Respiratory: Negative for shortness of breath. Gastrointestinal: Negative for abdominal pain, vomiting and diarrhea. Musculoskeletal: Positive for right leg pain Skin: Negative for rash. Neurological: Negative for headaches, focal weakness or numbness.  All systems negative/normal/unremarkable except as stated in the HPI  ____________________________________________   PHYSICAL EXAM:  VITAL SIGNS: ED Triage Vitals  Enc Vitals Group     BP 06/08/19 0641 (!) 142/81     Pulse Rate 06/08/19 0641 65     Resp 06/08/19 0641 17     Temp 06/08/19 0641 98.4 F (36.9 C)     Temp Source 06/08/19 0641 Oral     SpO2 06/08/19 0641 98 %     Weight 06/08/19 0646 190 lb (86.2 kg)     Height 06/08/19 0646 _0  (1.702 m)     Head Circumference --      Peak Flow --      Pain Score 06/08/19 0646 9     Pain Loc --      Pain Edu? --      Excl. in [place]? --    Constitutional: Alert and oriented. Well appearing and in no distress. Eyes: Conjunctivae are normal. Normal extraocular movements. ENT      Head: Normocephalic and  atraumatic.      Nose: No congestion/rhinnorhea.      Mouth/Throat: Mucous membranes are moist.      Neck: No stridor. Cardiovascular: Normal rate, regular rhythm. No murmurs, rubs, or gallops. Respiratory: Normal respiratory effort without tachypnea nor retractions. Breath sounds are clear and equal bilaterally. No wheezes/rales/rhonchi. Gastrointestinal: Soft and nontender. Normal bowel sounds Musculoskeletal: Severe pain with range of motion the right lower extremity, particular around the right knee and ankle.  Right ankle effusion is noted Neurologic:  Normal speech and language. No gross focal neurologic deficits are appreciated.  Skin:  Skin is warm, dry and intact. No rash noted. Psychiatric: Mood and affect are normal. Speech and behavior are normal.  ____________________________________________  EKG: Interpreted by me.  Sinus rhythm with rate of 68  bpm, left anterior fascicular block, nonspecific T wave changes, normal QT  ____________________________________________  ED COURSE:  As part of my medical decision making, I reviewed the following data within the [facility] History obtained from family if available, nursing notes, old chart and ekg, as well as notes from prior ED visits. Patient presented for syncope and right leg pain, we will assess with labs and imaging as indicated at this time.   Procedures  Jennifer Jacobs was evaluated in Emergency Department on 06/08/2019 for the symptoms described in the history of present illness. She was evaluated in the context of the global COVID-19 pandemic, which necessitated consideration that the patient might be at risk for infection with the SARS-CoV-2 virus that causes COVID-19. Institutional protocols and algorithms that pertain to the evaluation of patients at risk for COVID-19 are in a state of rapid change based on information released by regulatory bodies including the CDC and federal and state organizations.  These policies and algorithms were followed during the patient's care in the ED.  ____________________________________________   LABS (pertinent positives/negatives)  Labs Reviewed  BASIC METABOLIC PANEL - Abnormal; Notable for the following components:      Result Value   Glucose, Bld 114 (*)    Creatinine, Ser 1.26 (*)    GFR calc non Af Amer 43 (*)    GFR calc Af Amer 50 (*)    All other components within normal limits  CBC - Abnormal; Notable for the following components:   WBC 11.5 (*)    All other components within normal limits  URINALYSIS, COMPLETE (UACMP) WITH MICROSCOPIC - Abnormal; Notable for the following components:   Color, Urine YELLOW (*)    APPearance CLOUDY (*)    Hgb urine dipstick MODERATE (*)    Leukocytes,Ua SMALL (*)    Bacteria, UA RARE (*)    All other components within normal limits  ETHANOL  TROPONIN I (HIGH SENSITIVITY)  TROPONIN I (HIGH SENSITIVITY)    RADIOLOGY Images were viewed by me  Right hip, right ankle, right knee x-rays  IMPRESSION:  Moderate symmetric narrowing of each hip joint. Moderate  osteoarthritic change in the pubic symphysis region. No acute  fracture or dislocation.   IMPRESSION:  Soft tissue swelling without acute bony abnormality.  IMPRESSION:  Fracture at the base of the second metatarsal.   IMPRESSION:  Mildly displaced proximal fibular neck fracture.  ____________________________________________   DIFFERENTIAL DIAGNOSIS   Syncope, arrhythmia, MI, dehydration, electrolyte abnormality, intoxication, fracture, dislocation  FINAL ASSESSMENT AND PLAN  Syncope, UTI, proximal fibula fracture, second metatarsal fracture   Plan: The patient had presented for syncope and resulting right leg pain. Patient's labs reveal possible UTI but no other acute process. Patient's imaging revealed a proximal fibular neck fracture as well as second metatarsal fracture.  I have discussed with orthopedics, fibular fracture is  nonoperative and we have placed her in an Ace wrap.  We have placed her in a postoperative shoe for the metatarsal fracture.  We will consult physical therapy and orthopedics to assess for placement.   Laurence Aly, MD    Note: This note was generated in part or whole with voice recognition software. Voice recognition is usually quite accurate but there are transcription errors that can and very often do occur. I apologize for any typographical errors that were not detected and corrected.     Earleen Newport, MD 06/08/19 [phone]

## 2019-06-08 NOTE — ED Provider Notes (Signed)
Patient is no longer willing to stay for physical therapy assessment or placement.  She states someone is coming to pick her up.  We will attempt to facilitate care at home with home health and physical therapy.   Earleen Newport, MD 06/08/19 1248

## 2019-06-08 NOTE — ED Notes (Signed)
Lab contacted at this time. Lab states they have a red top to run ethanol from previous blood draw.

## 2019-06-08 NOTE — ED Notes (Signed)
Pt placed on bed pan due to inability to stand at this time. Pt able to lift bottom with left leg when laying flat to assist in bed pan placement

## 2019-06-08 NOTE — ED Notes (Signed)
Pt given another cup of coffee, and a cup of ice water.

## 2019-06-08 NOTE — ED Triage Notes (Addendum)
Pt says she was walking to the back door at a friend's house when she got dizzy and fell to the floor; twisted her right knee and right ankle when she fell; pt says she has not been able to stand on that leg since she fell; pt denies loss of consciousness; pt arrives awake and alert; talking in complete coherent sentences; pt says she is not dizzy now but has "black out" spells every now and again;

## 2019-06-08 NOTE — Social Work (Signed)
Post discharge note:   Patient chose [facility] for her home health services (PT, nurse aide). Cory from [facility] was contacted with patient's information.   [place], [place] [facility]  [phone]

## 2019-06-11 DIAGNOSIS — S82831D Other fracture of upper and lower end of right fibula, subsequent encounter for closed fracture with routine healing: Secondary | ICD-10-CM | POA: Diagnosis not present

## 2019-06-11 DIAGNOSIS — I69354 Hemiplegia and hemiparesis following cerebral infarction affecting left non-dominant side: Secondary | ICD-10-CM | POA: Diagnosis not present

## 2019-06-11 DIAGNOSIS — I714 Abdominal aortic aneurysm, without rupture: Secondary | ICD-10-CM | POA: Diagnosis not present

## 2019-06-11 DIAGNOSIS — I739 Peripheral vascular disease, unspecified: Secondary | ICD-10-CM | POA: Diagnosis not present

## 2019-06-11 DIAGNOSIS — S92324D Nondisplaced fracture of second metatarsal bone, right foot, subsequent encounter for fracture with routine healing: Secondary | ICD-10-CM | POA: Diagnosis not present

## 2019-06-11 DIAGNOSIS — I69315 Cognitive social or emotional deficit following cerebral infarction: Secondary | ICD-10-CM | POA: Diagnosis not present

## 2019-06-11 DIAGNOSIS — I119 Hypertensive heart disease without heart failure: Secondary | ICD-10-CM | POA: Diagnosis not present

## 2019-06-11 DIAGNOSIS — N3001 Acute cystitis with hematuria: Secondary | ICD-10-CM | POA: Diagnosis not present

## 2019-06-11 DIAGNOSIS — D649 Anemia, unspecified: Secondary | ICD-10-CM | POA: Diagnosis not present

## 2019-06-17 DIAGNOSIS — S92324D Nondisplaced fracture of second metatarsal bone, right foot, subsequent encounter for fracture with routine healing: Secondary | ICD-10-CM | POA: Diagnosis not present

## 2019-06-17 DIAGNOSIS — S82831D Other fracture of upper and lower end of right fibula, subsequent encounter for closed fracture with routine healing: Secondary | ICD-10-CM | POA: Diagnosis not present

## 2019-06-17 DIAGNOSIS — D649 Anemia, unspecified: Secondary | ICD-10-CM | POA: Diagnosis not present

## 2019-06-17 DIAGNOSIS — S82831A Other fracture of upper and lower end of right fibula, initial encounter for closed fracture: Secondary | ICD-10-CM | POA: Diagnosis not present

## 2019-06-17 DIAGNOSIS — N3001 Acute cystitis with hematuria: Secondary | ICD-10-CM | POA: Diagnosis not present

## 2019-06-17 DIAGNOSIS — S93491A Sprain of other ligament of right ankle, initial encounter: Secondary | ICD-10-CM | POA: Diagnosis not present

## 2019-06-17 DIAGNOSIS — I714 Abdominal aortic aneurysm, without rupture: Secondary | ICD-10-CM | POA: Diagnosis not present

## 2019-06-17 DIAGNOSIS — I69315 Cognitive social or emotional deficit following cerebral infarction: Secondary | ICD-10-CM | POA: Diagnosis not present

## 2019-06-17 DIAGNOSIS — I119 Hypertensive heart disease without heart failure: Secondary | ICD-10-CM | POA: Diagnosis not present

## 2019-06-17 DIAGNOSIS — I69354 Hemiplegia and hemiparesis following cerebral infarction affecting left non-dominant side: Secondary | ICD-10-CM | POA: Diagnosis not present

## 2019-06-17 DIAGNOSIS — M79671 Pain in right foot: Secondary | ICD-10-CM | POA: Diagnosis not present

## 2019-06-17 DIAGNOSIS — I739 Peripheral vascular disease, unspecified: Secondary | ICD-10-CM | POA: Diagnosis not present

## 2019-06-17 DIAGNOSIS — S92324A Nondisplaced fracture of second metatarsal bone, right foot, initial encounter for closed fracture: Secondary | ICD-10-CM | POA: Diagnosis not present

## 2019-06-18 ENCOUNTER — Other Ambulatory Visit (HOSPITAL_COMMUNITY): Payer: Self-pay | Admitting: Internal Medicine

## 2019-06-18 DIAGNOSIS — Z1231 Encounter for screening mammogram for malignant neoplasm of breast: Secondary | ICD-10-CM

## 2019-06-18 DIAGNOSIS — M25571 Pain in right ankle and joints of right foot: Secondary | ICD-10-CM | POA: Diagnosis not present

## 2019-06-18 DIAGNOSIS — S92311A Displaced fracture of first metatarsal bone, right foot, initial encounter for closed fracture: Secondary | ICD-10-CM | POA: Diagnosis not present

## 2019-06-18 DIAGNOSIS — S92324D Nondisplaced fracture of second metatarsal bone, right foot, subsequent encounter for fracture with routine healing: Secondary | ICD-10-CM | POA: Diagnosis not present

## 2019-06-18 DIAGNOSIS — D649 Anemia, unspecified: Secondary | ICD-10-CM | POA: Diagnosis not present

## 2019-06-18 DIAGNOSIS — I714 Abdominal aortic aneurysm, without rupture: Secondary | ICD-10-CM | POA: Diagnosis not present

## 2019-06-18 DIAGNOSIS — I739 Peripheral vascular disease, unspecified: Secondary | ICD-10-CM | POA: Diagnosis not present

## 2019-06-18 DIAGNOSIS — I69354 Hemiplegia and hemiparesis following cerebral infarction affecting left non-dominant side: Secondary | ICD-10-CM | POA: Diagnosis not present

## 2019-06-18 DIAGNOSIS — I69315 Cognitive social or emotional deficit following cerebral infarction: Secondary | ICD-10-CM | POA: Diagnosis not present

## 2019-06-18 DIAGNOSIS — G4089 Other seizures: Secondary | ICD-10-CM | POA: Diagnosis not present

## 2019-06-18 DIAGNOSIS — N3001 Acute cystitis with hematuria: Secondary | ICD-10-CM | POA: Diagnosis not present

## 2019-06-18 DIAGNOSIS — I119 Hypertensive heart disease without heart failure: Secondary | ICD-10-CM | POA: Diagnosis not present

## 2019-06-18 DIAGNOSIS — S82831D Other fracture of upper and lower end of right fibula, subsequent encounter for closed fracture with routine healing: Secondary | ICD-10-CM | POA: Diagnosis not present

## 2019-06-19 DIAGNOSIS — N3001 Acute cystitis with hematuria: Secondary | ICD-10-CM | POA: Diagnosis not present

## 2019-06-19 DIAGNOSIS — I119 Hypertensive heart disease without heart failure: Secondary | ICD-10-CM | POA: Diagnosis not present

## 2019-06-19 DIAGNOSIS — I69315 Cognitive social or emotional deficit following cerebral infarction: Secondary | ICD-10-CM | POA: Diagnosis not present

## 2019-06-19 DIAGNOSIS — D649 Anemia, unspecified: Secondary | ICD-10-CM | POA: Diagnosis not present

## 2019-06-19 DIAGNOSIS — I714 Abdominal aortic aneurysm, without rupture: Secondary | ICD-10-CM | POA: Diagnosis not present

## 2019-06-19 DIAGNOSIS — I69354 Hemiplegia and hemiparesis following cerebral infarction affecting left non-dominant side: Secondary | ICD-10-CM | POA: Diagnosis not present

## 2019-06-19 DIAGNOSIS — S92324D Nondisplaced fracture of second metatarsal bone, right foot, subsequent encounter for fracture with routine healing: Secondary | ICD-10-CM | POA: Diagnosis not present

## 2019-06-19 DIAGNOSIS — I739 Peripheral vascular disease, unspecified: Secondary | ICD-10-CM | POA: Diagnosis not present

## 2019-06-19 DIAGNOSIS — S82831D Other fracture of upper and lower end of right fibula, subsequent encounter for closed fracture with routine healing: Secondary | ICD-10-CM | POA: Diagnosis not present

## 2019-06-24 DIAGNOSIS — I714 Abdominal aortic aneurysm, without rupture: Secondary | ICD-10-CM | POA: Diagnosis not present

## 2019-06-24 DIAGNOSIS — S82831D Other fracture of upper and lower end of right fibula, subsequent encounter for closed fracture with routine healing: Secondary | ICD-10-CM | POA: Diagnosis not present

## 2019-06-24 DIAGNOSIS — N3001 Acute cystitis with hematuria: Secondary | ICD-10-CM | POA: Diagnosis not present

## 2019-06-24 DIAGNOSIS — S92324D Nondisplaced fracture of second metatarsal bone, right foot, subsequent encounter for fracture with routine healing: Secondary | ICD-10-CM | POA: Diagnosis not present

## 2019-06-24 DIAGNOSIS — I69315 Cognitive social or emotional deficit following cerebral infarction: Secondary | ICD-10-CM | POA: Diagnosis not present

## 2019-06-24 DIAGNOSIS — I739 Peripheral vascular disease, unspecified: Secondary | ICD-10-CM | POA: Diagnosis not present

## 2019-06-24 DIAGNOSIS — D649 Anemia, unspecified: Secondary | ICD-10-CM | POA: Diagnosis not present

## 2019-06-24 DIAGNOSIS — I119 Hypertensive heart disease without heart failure: Secondary | ICD-10-CM | POA: Diagnosis not present

## 2019-06-24 DIAGNOSIS — I69354 Hemiplegia and hemiparesis following cerebral infarction affecting left non-dominant side: Secondary | ICD-10-CM | POA: Diagnosis not present

## 2019-06-25 DIAGNOSIS — I69315 Cognitive social or emotional deficit following cerebral infarction: Secondary | ICD-10-CM | POA: Diagnosis not present

## 2019-06-25 DIAGNOSIS — I714 Abdominal aortic aneurysm, without rupture: Secondary | ICD-10-CM | POA: Diagnosis not present

## 2019-06-25 DIAGNOSIS — I739 Peripheral vascular disease, unspecified: Secondary | ICD-10-CM | POA: Diagnosis not present

## 2019-06-25 DIAGNOSIS — D649 Anemia, unspecified: Secondary | ICD-10-CM | POA: Diagnosis not present

## 2019-06-25 DIAGNOSIS — S82831D Other fracture of upper and lower end of right fibula, subsequent encounter for closed fracture with routine healing: Secondary | ICD-10-CM | POA: Diagnosis not present

## 2019-06-25 DIAGNOSIS — N3001 Acute cystitis with hematuria: Secondary | ICD-10-CM | POA: Diagnosis not present

## 2019-06-25 DIAGNOSIS — S92324D Nondisplaced fracture of second metatarsal bone, right foot, subsequent encounter for fracture with routine healing: Secondary | ICD-10-CM | POA: Diagnosis not present

## 2019-06-25 DIAGNOSIS — I119 Hypertensive heart disease without heart failure: Secondary | ICD-10-CM | POA: Diagnosis not present

## 2019-06-25 DIAGNOSIS — I69354 Hemiplegia and hemiparesis following cerebral infarction affecting left non-dominant side: Secondary | ICD-10-CM | POA: Diagnosis not present

## 2019-06-26 DIAGNOSIS — I739 Peripheral vascular disease, unspecified: Secondary | ICD-10-CM | POA: Diagnosis not present

## 2019-06-26 DIAGNOSIS — I69354 Hemiplegia and hemiparesis following cerebral infarction affecting left non-dominant side: Secondary | ICD-10-CM | POA: Diagnosis not present

## 2019-06-26 DIAGNOSIS — I69315 Cognitive social or emotional deficit following cerebral infarction: Secondary | ICD-10-CM | POA: Diagnosis not present

## 2019-06-26 DIAGNOSIS — S82831D Other fracture of upper and lower end of right fibula, subsequent encounter for closed fracture with routine healing: Secondary | ICD-10-CM | POA: Diagnosis not present

## 2019-06-26 DIAGNOSIS — D649 Anemia, unspecified: Secondary | ICD-10-CM | POA: Diagnosis not present

## 2019-06-26 DIAGNOSIS — S92324D Nondisplaced fracture of second metatarsal bone, right foot, subsequent encounter for fracture with routine healing: Secondary | ICD-10-CM | POA: Diagnosis not present

## 2019-06-26 DIAGNOSIS — N3001 Acute cystitis with hematuria: Secondary | ICD-10-CM | POA: Diagnosis not present

## 2019-06-26 DIAGNOSIS — I119 Hypertensive heart disease without heart failure: Secondary | ICD-10-CM | POA: Diagnosis not present

## 2019-06-26 DIAGNOSIS — I714 Abdominal aortic aneurysm, without rupture: Secondary | ICD-10-CM | POA: Diagnosis not present

## 2019-06-29 DIAGNOSIS — N3001 Acute cystitis with hematuria: Secondary | ICD-10-CM | POA: Diagnosis not present

## 2019-06-29 DIAGNOSIS — S82831D Other fracture of upper and lower end of right fibula, subsequent encounter for closed fracture with routine healing: Secondary | ICD-10-CM | POA: Diagnosis not present

## 2019-06-29 DIAGNOSIS — I739 Peripheral vascular disease, unspecified: Secondary | ICD-10-CM | POA: Diagnosis not present

## 2019-06-29 DIAGNOSIS — I69315 Cognitive social or emotional deficit following cerebral infarction: Secondary | ICD-10-CM | POA: Diagnosis not present

## 2019-06-29 DIAGNOSIS — S92324D Nondisplaced fracture of second metatarsal bone, right foot, subsequent encounter for fracture with routine healing: Secondary | ICD-10-CM | POA: Diagnosis not present

## 2019-06-29 DIAGNOSIS — I69354 Hemiplegia and hemiparesis following cerebral infarction affecting left non-dominant side: Secondary | ICD-10-CM | POA: Diagnosis not present

## 2019-06-29 DIAGNOSIS — D649 Anemia, unspecified: Secondary | ICD-10-CM | POA: Diagnosis not present

## 2019-06-29 DIAGNOSIS — I119 Hypertensive heart disease without heart failure: Secondary | ICD-10-CM | POA: Diagnosis not present

## 2019-06-29 DIAGNOSIS — I714 Abdominal aortic aneurysm, without rupture: Secondary | ICD-10-CM | POA: Diagnosis not present

## 2019-06-30 DIAGNOSIS — I69354 Hemiplegia and hemiparesis following cerebral infarction affecting left non-dominant side: Secondary | ICD-10-CM | POA: Diagnosis not present

## 2019-06-30 DIAGNOSIS — D649 Anemia, unspecified: Secondary | ICD-10-CM | POA: Diagnosis not present

## 2019-06-30 DIAGNOSIS — I69315 Cognitive social or emotional deficit following cerebral infarction: Secondary | ICD-10-CM | POA: Diagnosis not present

## 2019-06-30 DIAGNOSIS — N3001 Acute cystitis with hematuria: Secondary | ICD-10-CM | POA: Diagnosis not present

## 2019-06-30 DIAGNOSIS — I714 Abdominal aortic aneurysm, without rupture: Secondary | ICD-10-CM | POA: Diagnosis not present

## 2019-06-30 DIAGNOSIS — I119 Hypertensive heart disease without heart failure: Secondary | ICD-10-CM | POA: Diagnosis not present

## 2019-06-30 DIAGNOSIS — I739 Peripheral vascular disease, unspecified: Secondary | ICD-10-CM | POA: Diagnosis not present

## 2019-06-30 DIAGNOSIS — S82831D Other fracture of upper and lower end of right fibula, subsequent encounter for closed fracture with routine healing: Secondary | ICD-10-CM | POA: Diagnosis not present

## 2019-06-30 DIAGNOSIS — S92324D Nondisplaced fracture of second metatarsal bone, right foot, subsequent encounter for fracture with routine healing: Secondary | ICD-10-CM | POA: Diagnosis not present

## 2019-07-01 DIAGNOSIS — N3001 Acute cystitis with hematuria: Secondary | ICD-10-CM | POA: Diagnosis not present

## 2019-07-01 DIAGNOSIS — S82831D Other fracture of upper and lower end of right fibula, subsequent encounter for closed fracture with routine healing: Secondary | ICD-10-CM | POA: Diagnosis not present

## 2019-07-01 DIAGNOSIS — I69315 Cognitive social or emotional deficit following cerebral infarction: Secondary | ICD-10-CM | POA: Diagnosis not present

## 2019-07-01 DIAGNOSIS — I69354 Hemiplegia and hemiparesis following cerebral infarction affecting left non-dominant side: Secondary | ICD-10-CM | POA: Diagnosis not present

## 2019-07-01 DIAGNOSIS — S92324D Nondisplaced fracture of second metatarsal bone, right foot, subsequent encounter for fracture with routine healing: Secondary | ICD-10-CM | POA: Diagnosis not present

## 2019-07-01 DIAGNOSIS — I739 Peripheral vascular disease, unspecified: Secondary | ICD-10-CM | POA: Diagnosis not present

## 2019-07-01 DIAGNOSIS — I714 Abdominal aortic aneurysm, without rupture: Secondary | ICD-10-CM | POA: Diagnosis not present

## 2019-07-01 DIAGNOSIS — I119 Hypertensive heart disease without heart failure: Secondary | ICD-10-CM | POA: Diagnosis not present

## 2019-07-01 DIAGNOSIS — D649 Anemia, unspecified: Secondary | ICD-10-CM | POA: Diagnosis not present

## 2019-07-03 DIAGNOSIS — R5381 Other malaise: Secondary | ICD-10-CM | POA: Diagnosis not present

## 2019-07-08 DIAGNOSIS — S92324D Nondisplaced fracture of second metatarsal bone, right foot, subsequent encounter for fracture with routine healing: Secondary | ICD-10-CM | POA: Diagnosis not present

## 2019-07-08 DIAGNOSIS — I69354 Hemiplegia and hemiparesis following cerebral infarction affecting left non-dominant side: Secondary | ICD-10-CM | POA: Diagnosis not present

## 2019-07-08 DIAGNOSIS — I714 Abdominal aortic aneurysm, without rupture: Secondary | ICD-10-CM | POA: Diagnosis not present

## 2019-07-08 DIAGNOSIS — N3001 Acute cystitis with hematuria: Secondary | ICD-10-CM | POA: Diagnosis not present

## 2019-07-08 DIAGNOSIS — I119 Hypertensive heart disease without heart failure: Secondary | ICD-10-CM | POA: Diagnosis not present

## 2019-07-08 DIAGNOSIS — I739 Peripheral vascular disease, unspecified: Secondary | ICD-10-CM | POA: Diagnosis not present

## 2019-07-08 DIAGNOSIS — D649 Anemia, unspecified: Secondary | ICD-10-CM | POA: Diagnosis not present

## 2019-07-08 DIAGNOSIS — I69315 Cognitive social or emotional deficit following cerebral infarction: Secondary | ICD-10-CM | POA: Diagnosis not present

## 2019-07-08 DIAGNOSIS — S82831D Other fracture of upper and lower end of right fibula, subsequent encounter for closed fracture with routine healing: Secondary | ICD-10-CM | POA: Diagnosis not present

## 2019-07-09 DIAGNOSIS — I69354 Hemiplegia and hemiparesis following cerebral infarction affecting left non-dominant side: Secondary | ICD-10-CM | POA: Diagnosis not present

## 2019-07-09 DIAGNOSIS — I119 Hypertensive heart disease without heart failure: Secondary | ICD-10-CM | POA: Diagnosis not present

## 2019-07-09 DIAGNOSIS — S92324D Nondisplaced fracture of second metatarsal bone, right foot, subsequent encounter for fracture with routine healing: Secondary | ICD-10-CM | POA: Diagnosis not present

## 2019-07-09 DIAGNOSIS — D649 Anemia, unspecified: Secondary | ICD-10-CM | POA: Diagnosis not present

## 2019-07-09 DIAGNOSIS — I69315 Cognitive social or emotional deficit following cerebral infarction: Secondary | ICD-10-CM | POA: Diagnosis not present

## 2019-07-09 DIAGNOSIS — I714 Abdominal aortic aneurysm, without rupture: Secondary | ICD-10-CM | POA: Diagnosis not present

## 2019-07-09 DIAGNOSIS — S82831D Other fracture of upper and lower end of right fibula, subsequent encounter for closed fracture with routine healing: Secondary | ICD-10-CM | POA: Diagnosis not present

## 2019-07-09 DIAGNOSIS — N3001 Acute cystitis with hematuria: Secondary | ICD-10-CM | POA: Diagnosis not present

## 2019-07-09 DIAGNOSIS — I739 Peripheral vascular disease, unspecified: Secondary | ICD-10-CM | POA: Diagnosis not present

## 2019-07-14 DIAGNOSIS — S82831D Other fracture of upper and lower end of right fibula, subsequent encounter for closed fracture with routine healing: Secondary | ICD-10-CM | POA: Diagnosis not present

## 2019-07-14 DIAGNOSIS — I714 Abdominal aortic aneurysm, without rupture: Secondary | ICD-10-CM | POA: Diagnosis not present

## 2019-07-14 DIAGNOSIS — D649 Anemia, unspecified: Secondary | ICD-10-CM | POA: Diagnosis not present

## 2019-07-14 DIAGNOSIS — N3001 Acute cystitis with hematuria: Secondary | ICD-10-CM | POA: Diagnosis not present

## 2019-07-14 DIAGNOSIS — S92324D Nondisplaced fracture of second metatarsal bone, right foot, subsequent encounter for fracture with routine healing: Secondary | ICD-10-CM | POA: Diagnosis not present

## 2019-07-14 DIAGNOSIS — I739 Peripheral vascular disease, unspecified: Secondary | ICD-10-CM | POA: Diagnosis not present

## 2019-07-14 DIAGNOSIS — I119 Hypertensive heart disease without heart failure: Secondary | ICD-10-CM | POA: Diagnosis not present

## 2019-07-14 DIAGNOSIS — I69354 Hemiplegia and hemiparesis following cerebral infarction affecting left non-dominant side: Secondary | ICD-10-CM | POA: Diagnosis not present

## 2019-07-14 DIAGNOSIS — I69315 Cognitive social or emotional deficit following cerebral infarction: Secondary | ICD-10-CM | POA: Diagnosis not present

## 2019-07-20 DIAGNOSIS — Z961 Presence of intraocular lens: Secondary | ICD-10-CM | POA: Diagnosis not present

## 2019-07-20 DIAGNOSIS — H26493 Other secondary cataract, bilateral: Secondary | ICD-10-CM | POA: Diagnosis not present

## 2019-07-22 DIAGNOSIS — M7989 Other specified soft tissue disorders: Secondary | ICD-10-CM | POA: Diagnosis not present

## 2019-07-22 DIAGNOSIS — M25571 Pain in right ankle and joints of right foot: Secondary | ICD-10-CM | POA: Diagnosis not present

## 2019-07-22 DIAGNOSIS — S82831D Other fracture of upper and lower end of right fibula, subsequent encounter for closed fracture with routine healing: Secondary | ICD-10-CM | POA: Diagnosis not present

## 2019-07-22 DIAGNOSIS — S92324D Nondisplaced fracture of second metatarsal bone, right foot, subsequent encounter for fracture with routine healing: Secondary | ICD-10-CM | POA: Diagnosis not present

## 2019-07-22 DIAGNOSIS — M79671 Pain in right foot: Secondary | ICD-10-CM | POA: Diagnosis not present

## 2019-07-28 DIAGNOSIS — I69315 Cognitive social or emotional deficit following cerebral infarction: Secondary | ICD-10-CM | POA: Diagnosis not present

## 2019-07-28 DIAGNOSIS — I739 Peripheral vascular disease, unspecified: Secondary | ICD-10-CM | POA: Diagnosis not present

## 2019-07-28 DIAGNOSIS — J449 Chronic obstructive pulmonary disease, unspecified: Secondary | ICD-10-CM | POA: Diagnosis not present

## 2019-07-28 DIAGNOSIS — S92324D Nondisplaced fracture of second metatarsal bone, right foot, subsequent encounter for fracture with routine healing: Secondary | ICD-10-CM | POA: Diagnosis not present

## 2019-07-28 DIAGNOSIS — I714 Abdominal aortic aneurysm, without rupture: Secondary | ICD-10-CM | POA: Diagnosis not present

## 2019-07-28 DIAGNOSIS — I119 Hypertensive heart disease without heart failure: Secondary | ICD-10-CM | POA: Diagnosis not present

## 2019-07-28 DIAGNOSIS — I69354 Hemiplegia and hemiparesis following cerebral infarction affecting left non-dominant side: Secondary | ICD-10-CM | POA: Diagnosis not present

## 2019-07-28 DIAGNOSIS — S82831D Other fracture of upper and lower end of right fibula, subsequent encounter for closed fracture with routine healing: Secondary | ICD-10-CM | POA: Diagnosis not present

## 2019-07-28 DIAGNOSIS — N3001 Acute cystitis with hematuria: Secondary | ICD-10-CM | POA: Diagnosis not present

## 2019-08-10 ENCOUNTER — Other Ambulatory Visit: Payer: Self-pay

## 2019-08-10 ENCOUNTER — Ambulatory Visit (HOSPITAL_COMMUNITY)
Admission: RE | Admit: 2019-08-10 | Discharge: 2019-08-10 | Disposition: A | Discharge status: Home / Self Care | Payer: Medicare HMO | Source: Ambulatory Visit | Attending: Internal Medicine | Admitting: Internal Medicine

## 2019-08-10 DIAGNOSIS — Z1231 Encounter for screening mammogram for malignant neoplasm of breast: Secondary | ICD-10-CM

## 2019-08-12 DIAGNOSIS — R7301 Impaired fasting glucose: Secondary | ICD-10-CM | POA: Diagnosis not present

## 2019-08-12 DIAGNOSIS — E782 Mixed hyperlipidemia: Secondary | ICD-10-CM | POA: Diagnosis not present

## 2019-08-12 DIAGNOSIS — D649 Anemia, unspecified: Secondary | ICD-10-CM | POA: Diagnosis not present

## 2019-08-12 DIAGNOSIS — I10 Essential (primary) hypertension: Secondary | ICD-10-CM | POA: Diagnosis not present

## 2019-08-20 ENCOUNTER — Other Ambulatory Visit (HOSPITAL_COMMUNITY): Payer: Self-pay | Admitting: Internal Medicine

## 2019-08-20 DIAGNOSIS — N3281 Overactive bladder: Secondary | ICD-10-CM | POA: Diagnosis not present

## 2019-08-20 DIAGNOSIS — E782 Mixed hyperlipidemia: Secondary | ICD-10-CM | POA: Diagnosis not present

## 2019-08-20 DIAGNOSIS — R944 Abnormal results of kidney function studies: Secondary | ICD-10-CM | POA: Diagnosis not present

## 2019-08-20 DIAGNOSIS — R600 Localized edema: Secondary | ICD-10-CM | POA: Diagnosis not present

## 2019-08-20 DIAGNOSIS — N183 Chronic kidney disease, stage 3 unspecified: Secondary | ICD-10-CM | POA: Diagnosis not present

## 2019-08-20 DIAGNOSIS — D509 Iron deficiency anemia, unspecified: Secondary | ICD-10-CM | POA: Diagnosis not present

## 2019-08-20 DIAGNOSIS — R928 Other abnormal and inconclusive findings on diagnostic imaging of breast: Secondary | ICD-10-CM

## 2019-08-20 DIAGNOSIS — R7301 Impaired fasting glucose: Secondary | ICD-10-CM | POA: Diagnosis not present

## 2019-09-01 ENCOUNTER — Ambulatory Visit (HOSPITAL_COMMUNITY): Admission: RE | Admit: 2019-09-01 | Payer: Medicare HMO | Source: Ambulatory Visit

## 2019-09-10 DIAGNOSIS — Z0100 Encounter for examination of eyes and vision without abnormal findings: Secondary | ICD-10-CM | POA: Diagnosis not present

## 2019-09-10 DIAGNOSIS — Z961 Presence of intraocular lens: Secondary | ICD-10-CM | POA: Diagnosis not present

## 2019-09-10 DIAGNOSIS — E7800 Pure hypercholesterolemia, unspecified: Secondary | ICD-10-CM | POA: Diagnosis not present

## 2019-09-10 DIAGNOSIS — H524 Presbyopia: Secondary | ICD-10-CM | POA: Diagnosis not present

## 2019-09-10 DIAGNOSIS — I10 Essential (primary) hypertension: Secondary | ICD-10-CM | POA: Diagnosis not present

## 2019-09-15 ENCOUNTER — Ambulatory Visit (HOSPITAL_COMMUNITY)
Admission: RE | Admit: 2019-09-15 | Discharge: 2019-09-15 | Disposition: A | Discharge status: Home / Self Care | Payer: Medicare HMO | Source: Ambulatory Visit | Attending: Internal Medicine | Admitting: Internal Medicine

## 2019-09-15 ENCOUNTER — Telehealth: Payer: Self-pay

## 2019-09-15 ENCOUNTER — Other Ambulatory Visit: Payer: Self-pay

## 2019-09-15 DIAGNOSIS — R928 Other abnormal and inconclusive findings on diagnostic imaging of breast: Secondary | ICD-10-CM | POA: Insufficient documentation

## 2019-09-15 DIAGNOSIS — N6011 Diffuse cystic mastopathy of right breast: Secondary | ICD-10-CM | POA: Diagnosis not present

## 2019-09-15 NOTE — Telephone Encounter (Signed)
Virtual Visit Pre-Appointment Phone Call  "(Name), I am calling you today to discuss your upcoming appointment. We are currently trying to limit exposure to the virus that causes COVID-19 by seeing patients at home rather than in the office."  "What is the BEST phone number to call the day of the visit?" - include this in appointment notes  "Do you have or have access to (through a family member/friend) a smartphone with video capability that we can use for your visit?" If yes - list this number in appt notes as "cell" (if different from BEST phone #) and list the appointment type as a VIDEO visit in appointment notes If no - list the appointment type as a PHONE visit in appointment notes  Confirm consent - "In the setting of the current Covid19 crisis, you are scheduled for a (phone or video) visit with your provider on (date) at (time).  Just as we do with many in-office visits, in order for you to participate in this visit, we must obtain consent.  If you'd like, I can send this to your mychart (if signed up) or email for you to review.  Otherwise, I can obtain your verbal consent now.  All virtual visits are billed to your insurance company just like a normal visit would be.  By agreeing to a virtual visit, we'd like you to understand that the technology does not allow for your provider to perform an examination, and thus may limit your provider's ability to fully assess your condition. If your provider identifies any concerns that need to be evaluated in person, we will make arrangements to do so.  Finally, though the technology is pretty good, we cannot assure that it will always work on either your or our end, and in the setting of a video visit, we may have to convert it to a phone-only visit.  In either situation, we cannot ensure that we have a secure connection.  Are you willing to proceed?" STAFF: Did the patient verbally acknowledge consent to telehealth visit? Document YES/NO  here:  Advise patient to be prepared - "Two hours prior to your appointment, go ahead and check your blood pressure, pulse, oxygen saturation, and your weight (if you have the equipment to check those) and write them all down. When your visit starts, your provider will ask you for this information. If you have an Apple Watch or Kardia device, please plan to have heart rate information ready on the day of your appointment. Please have a pen and paper handy nearby the day of the visit as well."  Give patient instructions for MyChart download to smartphone OR [email or web address] as below if video visit (depending on what platform provider is using)  Inform patient they will receive a phone call 15 minutes prior to their appointment time (may be from unknown caller ID) so they should be prepared to answer    TELEPHONE CALL NOTE  Jennifer Jacobs has been deemed a candidate for a follow-up tele-health visit to limit community exposure during the Covid-19 pandemic. I spoke with the patient via phone to ensure availability of phone/video source, confirm preferred email & phone number, and discuss instructions and expectations.  I reminded Jennifer Jacobs to be prepared with any vital sign and/or heart rhythm information that could potentially be obtained via home monitoring, at the time of her visit. I reminded Jennifer Jacobs to expect a phone call prior to her visit.  [name] 09/15/2019 8:50 AM  INSTRUCTIONS FOR DOWNLOADING THE MYCHART APP TO SMARTPHONE  - The patient must first make sure to have activated MyChart and know their login information - If Apple, go to [employer] and type in MyChart in the search bar and download the app. If Android, ask patient to go to [employer] and type in [place] in the search bar and download the app. The app is free but as with any other app downloads, their phone may require them to verify saved payment information or Apple/Android password.  -  The patient will need to then log into the app with their MyChart username and password, and select [facility] as their healthcare provider to link the account. When it is time for your visit, go to the MyChart app, find appointments, and click Begin Video Visit. Be sure to Select Allow for your device to access the Microphone and Camera for your visit. You will then be connected, and your provider will be with you shortly.  **If they have any issues connecting, or need assistance please contact MyChart service desk [phone])**  **If using a computer, in order to ensure the best quality for their visit they will need to use either of the following Internet Browsers: [employer], or Google Chrome**  IF USING DOXIMITY or DOXY.ME - The patient will receive a link just prior to their visit by text.     FULL LENGTH CONSENT FOR TELE-HEALTH VISIT   I hereby voluntarily request, consent and authorize [facility] and its employed or contracted physicians, physician assistants, nurse practitioners or other licensed health care professionals (the Practitioner), to provide me with telemedicine health care services (the "Services") as deemed necessary by the treating Practitioner. I acknowledge and consent to receive the Services by the Practitioner via telemedicine. I understand that the telemedicine visit will involve communicating with the Practitioner through live audiovisual communication technology and the disclosure of certain medical information by electronic transmission. I acknowledge that I have been given the opportunity to request an in-person assessment or other available alternative prior to the telemedicine visit and am voluntarily participating in the telemedicine visit.  I understand that I have the right to withhold or withdraw my consent to the use of telemedicine in the course of my care at any time, without affecting my right to future care or treatment, and that the  Practitioner or I may terminate the telemedicine visit at any time. I understand that I have the right to inspect all information obtained and/or recorded in the course of the telemedicine visit and may receive copies of available information for a reasonable fee.  I understand that some of the potential risks of receiving the Services via telemedicine include:  Delay or interruption in medical evaluation due to technological equipment failure or disruption; Information transmitted may not be sufficient (e.g. poor resolution of images) to allow for appropriate medical decision making by the Practitioner; and/or  In rare instances, security protocols could fail, causing a breach of personal health information.  Furthermore, I acknowledge that it is my responsibility to provide information about my medical history, conditions and care that is complete and accurate to the best of my ability. I acknowledge that Practitioner's advice, recommendations, and/or decision may be based on factors not within their control, such as incomplete or inaccurate data provided by me or distortions of diagnostic images or specimens that may result from electronic transmissions. I understand that the practice of medicine is not an exact science and that Practitioner  makes no warranties or guarantees regarding treatment outcomes. I acknowledge that I will receive a copy of this consent concurrently upon execution via email to the email address I last provided but may also request a printed copy by calling the office of [facility].    I understand that my insurance will be billed for this visit.   I have read or had this consent read to me. I understand the contents of this consent, which adequately explains the benefits and risks of the Services being provided via telemedicine.  I have been provided ample opportunity to ask questions regarding this consent and the Services and have had my questions answered to my  satisfaction. I give my informed consent for the services to be provided through the use of telemedicine in my medical care  By participating in this telemedicine visit I agree to the above.

## 2019-09-16 ENCOUNTER — Telehealth (INDEPENDENT_AMBULATORY_CARE_PROVIDER_SITE_OTHER): Payer: Medicare HMO | Admitting: Cardiology

## 2019-09-16 ENCOUNTER — Encounter: Payer: Self-pay | Admitting: Cardiology

## 2019-09-16 ENCOUNTER — Telehealth: Payer: Self-pay | Admitting: Cardiology

## 2019-09-16 VITALS — Ht 68.0 in | Wt 215.0 lb

## 2019-09-16 DIAGNOSIS — R55 Syncope and collapse: Secondary | ICD-10-CM | POA: Diagnosis not present

## 2019-09-16 DIAGNOSIS — R002 Palpitations: Secondary | ICD-10-CM

## 2019-09-16 NOTE — Addendum Note (Signed)
Addended by: Burman Nieves T on: 09/16/2019 09:05 AM   Modules accepted: Orders

## 2019-09-16 NOTE — Progress Notes (Signed)
Virtual Visit via Telephone Note   This visit type was conducted due to national recommendations for restrictions regarding the COVID-19 Pandemic (e.g. social distancing) in an effort to limit this patient's exposure and mitigate transmission in our community.  Due to her co-morbid illnesses, this patient is at least at moderate risk for complications without adequate follow up.  This format is felt to be most appropriate for this patient at this time.  The patient did not have access to video technology/had technical difficulties with video requiring transitioning to audio format only (telephone).  All issues noted in this document were discussed and addressed.  No physical exam could be performed with this format.  Please refer to the patient's chart for her  consent to telehealth for [facility].   Date:  09/16/2019   ID:  Jennifer Jacobs, DOB [birth year], MRN [id number]  Patient Location: Home Provider Location: Office  PCP:  Celene Squibb, MD  Cardiologist:  Dorris Carnes, MD  Electrophysiologist:  None   Evaluation Performed:  Follow-Up Visit  Chief Complaint:  Follow up visit  History of Present Illness:    Jennifer Jacobs is a 74 y.o. female seen today for hospital follow up, this is our first visit together. This is a focused visit on recent issues with syncope   1. Syncope - seen in ER 05/2019 with syncope - fall complicated by fibular fracture - found to have UTI at the time  - reports intermittent episodes of syncope since ER visit - whiles standing gets dizzy, everything just went black. LOC  - difficult historian, unable to get more specific detials    The patient does not have symptoms concerning for COVID-19 infection (fever, chills, cough, or new shortness of breath).    Past Medical History:  Diagnosis Date  . Acute blood loss anemia   . AKI (acute kidney injury) ([place]) 01/11/2018  . Alcohol abuse 07/05/2014  . Allergy   . Anxiety   . Anxiety and  depression   . Arthritis   . Cognitive deficit due to recent stroke 11/22/2016  . COPD (chronic obstructive pulmonary disease) ([place])   . Depression   . Dyslipidemia   . Fibromyalgia   . Hepatic cyst   . Hyperlipidemia   . Hypertension   . Leukocytosis   . Liver cyst 03/18/2014   01/26/2014 ultrasound Hypoechoic structure adjacent to gallbladder right lobe liver,  measuring 29 x 18 x 17 mm, possibly septated cyst    . MI (myocardial infarction) ([place]) 2007   By patient report, not substantiated by recent nuclear stress test.  . Neuromuscular disorder ([place])   . Seizure disorder ([place])    none since 2002  . Seizures ([place])   . Smoker unmotivated to quit    Quit for 3 years and then restarted  . Substance abuse ([place])    ETOH  . Vision abnormalities    Past Surgical History:  Procedure Laterality Date  . CATARACT EXTRACTION W/PHACO Right 06/30/2018   Procedure: CATARACT EXTRACTION PHACO AND INTRAOCULAR LENS PLACEMENT RIGHT EYE;  Surgeon: Tonny , MD;  Location: [facility];  Service: Ophthalmology;  Laterality: Right;  CDE: 9.90  . CATARACT EXTRACTION W/PHACO Left 07/14/2018   Procedure: CATARACT EXTRACTION PHACO AND INTRAOCULAR LENS PLACEMENT (IOC);  Surgeon: Tonny , MD;  Location: [facility];  Service: Ophthalmology;  Laterality: Left;  CDE: 6.96  . CRANIOTOMY Left 11/19/2016   Procedure: CRANIOTOMY INTRACRANIAL FOR  ANEURYSM CLIPPING;  Surgeon: Kevan Ny Ditty, MD;  Location: [facility];  Service: Neurosurgery;  Laterality: Left;  . ELBOW ARTHROPLASTY Left    rods  . Exercise tolerance test  04/27/2013   CPET-MET: Submaximal effort (0.96, with goal of greater than 1.09) -- patient states that her effort was limited due to knee pain.  This makes the rest of the interpretation difficult: Peak VO2 - 10.5 (59% moderate), peak 02-pulse -  7..11 (low); heart rate 114 (73% -chronic incompetence considered);; PFTs normal   . MOUTH SURGERY    . NM MYOVIEW LTD  05/19/2013   LexiScan: EF 80%, no  evidence of ischemia or infarction  . TRANSTHORACIC ECHOCARDIOGRAM  03/25/2013   EF 55-60%, no regional wall motion abnormalities; normal diastolic parameters, normal pulmonary pressures.  No valvular lesions noted.  Essentially normal echo      No outpatient medications have been marked as taking for the 09/16/19 encounter (Appointment) with [name], MD.     Allergies:   Aspirin and Prozac [fluoxetine hcl]   Social History   Tobacco Use  . Smoking status: Current Every Day Smoker    Packs/day: 0.50    Types: Cigarettes  . Smokeless tobacco: Never Used  . Tobacco comment: one pack every 3 months  Substance Use Topics  . Alcohol use: Yes    Comment: occasionally  . Drug use: Yes    Frequency: 2.0 times per week    Types: Marijuana    Comment: last smoked 1 week ago     Family Hx: The patient's family history includes Alcohol abuse in her brother, brother, father, mother, sister, sister, and son; Cancer in her maternal grandfather, paternal aunt, and paternal grandmother; Depression in her mother, sister, sister, and sister; Diabetes in her daughter; Heart attack in her maternal grandmother; Heart attack (age of onset: 49) in her father; Heart disease (age of onset: 81) in her daughter; Hypertension in her father; Ovarian cancer in her paternal aunt; Prostate cancer in her paternal uncle; Stomach cancer in her paternal aunt.  ROS:   Please see the history of present illness.     All other systems reviewed and are negative.   Prior CV studies:   The following studies were reviewed today:  TTE: 01/11/18  Study Conclusions  - Left ventricle: The cavity size was normal. Wall thickness was increased in a pattern of moderate LVH. Systolic function was normal. The estimated ejection fraction was in the range of 60% to 65%. Wall motion was normal; there were no regional wall motion abnormalities. Doppler parameters are consistent with abnormal left  ventricular relaxation (grade 1 diastolic dysfunction). The E/e&' ratio is <8, suggesting normal LV filling pressure. - Left atrium: The atrium was normal in size. - Right ventricle: The cavity size was mildly dilated. Mildly reduced systolic function. - Right atrium: The atrium was mildly dilated. - Tricuspid valve: There was mild regurgitation. - Pulmonary arteries: PA peak pressure: 57 mm Hg (S). - Inferior vena cava: The vessel was normal in size. The respirophasic diameter changes were in the normal range (>= 50%), consistent with normal central venous pressure.  Impressions:  - Compared to a prior study in 03/2013, the LVEF is higher at 60-65%. The RVSP is also higher at 57 mmHg.  Lexiscan: 01/13/18   There was no ST segment deviation noted during stress.  T wave inversion was noted during stress in the V1, V2, V4, V3, V5, V6, II, III and aVF leads.  Defect 1: There is a medium defect of severe severity present  in the basal inferior and mid inferior location.  This is a low risk study.  Nuclear stress EF: 78%.  The left ventricular ejection fraction is hyperdynamic (>65%).  Abnormal, low risk stress nuclear study with significant soft tissue attenuation (inferior wall) vs prior infarct; no significant ischemia; EF    Labs/Other Tests and Data Reviewed:    EKG:  No ECG reviewed.  Recent Labs: 06/08/2019: BUN 13; Creatinine, Ser 1.26; Hemoglobin 13.0; Platelets 272; Potassium 3.9; Sodium 140   Recent Lipid Panel No results found for: CHOL, TRIG, HDL, CHOLHDL, LDLCALC, LDLDIRECT  Wt Readings from Last 3 Encounters:  06/08/19 190 lb (86.2 kg)  07/14/18 198 lb (89.8 kg)  06/30/18 188 lb 15 oz (85.7 kg)     Objective:    Vital Signs:   Today's Vitals   09/16/19 0819  Weight: 215 lb (97.5 kg)  Height: '5\' 8"'  (1.727 m)   Body mass index is 32.69 kg/m. Normal affect. Normal speech pattern and tone. Comfortable, no apparent distress. No audible  signs of SOB or wheezing.   ASSESSMENT & PLAN:    1. Syncope - unclear etiology, somewhat difficult historian  - obtain 30 day monitor to evaluate for arrhythmia - f/u 6 weeks, obtain orthostatics at that time. Possible echo pending monitor results.   COVID-19 Education: The signs and symptoms of COVID-19 were discussed with the patient and how to seek care for testing (follow up with PCP or arrange E-visit).  The importance of social distancing was discussed today.  Time:   Today, I have spent 14 minutes with the patient with telehealth technology discussing the above problems.     Medication Adjustments/Labs and Tests Ordered: Current medicines are reviewed at length with the patient today.  Concerns regarding medicines are outlined above.   Tests Ordered: No orders of the defined types were placed in this encounter.   Medication Changes: No orders of the defined types were placed in this encounter.   Follow Up:  In Person 6 weeks  Signed, Carlyle Dolly, MD  09/16/2019 7:53 AM    [place]

## 2019-09-16 NOTE — Telephone Encounter (Signed)
  Precert needed for:  30 DAY EVENT MONITOR

## 2019-09-16 NOTE — Patient Instructions (Signed)
Your physician recommends that you schedule a follow-up appointment in: 6 WEEKS WITH DR [facility]  Your physician recommends that you continue on your current medications as directed. Please refer to the Current Medication list given to you today.  Your physician has recommended that you wear an event monitor FOR 30 DAYS. Event monitors are medical devices that record the heart's electrical activity. Doctors most often [place] these monitors to diagnose arrhythmias. Arrhythmias are problems with the speed or rhythm of the heartbeat. The monitor is a small, portable device. You can wear one while you do your normal daily activities. This is usually used to diagnose what is causing palpitations/syncope (passing out).  Thank you for choosing [facility]!!

## 2019-09-17 DIAGNOSIS — Z8673 Personal history of transient ischemic attack (TIA), and cerebral infarction without residual deficits: Secondary | ICD-10-CM | POA: Diagnosis not present

## 2019-09-17 DIAGNOSIS — I10 Essential (primary) hypertension: Secondary | ICD-10-CM | POA: Diagnosis not present

## 2019-09-17 DIAGNOSIS — I639 Cerebral infarction, unspecified: Secondary | ICD-10-CM | POA: Diagnosis not present

## 2019-09-17 DIAGNOSIS — I2699 Other pulmonary embolism without acute cor pulmonale: Secondary | ICD-10-CM | POA: Diagnosis not present

## 2019-09-17 DIAGNOSIS — G4089 Other seizures: Secondary | ICD-10-CM | POA: Diagnosis not present

## 2019-09-17 DIAGNOSIS — E782 Mixed hyperlipidemia: Secondary | ICD-10-CM | POA: Diagnosis not present

## 2019-09-17 DIAGNOSIS — F3181 Bipolar II disorder: Secondary | ICD-10-CM | POA: Diagnosis not present

## 2019-09-17 DIAGNOSIS — E7849 Other hyperlipidemia: Secondary | ICD-10-CM | POA: Diagnosis not present

## 2019-09-17 DIAGNOSIS — Z86711 Personal history of pulmonary embolism: Secondary | ICD-10-CM | POA: Diagnosis not present

## 2019-09-21 DIAGNOSIS — I639 Cerebral infarction, unspecified: Secondary | ICD-10-CM | POA: Diagnosis not present

## 2019-09-21 DIAGNOSIS — I2699 Other pulmonary embolism without acute cor pulmonale: Secondary | ICD-10-CM | POA: Diagnosis not present

## 2019-09-21 DIAGNOSIS — Z86711 Personal history of pulmonary embolism: Secondary | ICD-10-CM | POA: Diagnosis not present

## 2019-09-21 DIAGNOSIS — E782 Mixed hyperlipidemia: Secondary | ICD-10-CM | POA: Diagnosis not present

## 2019-09-21 DIAGNOSIS — G4089 Other seizures: Secondary | ICD-10-CM | POA: Diagnosis not present

## 2019-09-21 DIAGNOSIS — E7849 Other hyperlipidemia: Secondary | ICD-10-CM | POA: Diagnosis not present

## 2019-09-21 DIAGNOSIS — F3181 Bipolar II disorder: Secondary | ICD-10-CM | POA: Diagnosis not present

## 2019-09-21 DIAGNOSIS — I10 Essential (primary) hypertension: Secondary | ICD-10-CM | POA: Diagnosis not present

## 2019-09-21 DIAGNOSIS — Z8673 Personal history of transient ischemic attack (TIA), and cerebral infarction without residual deficits: Secondary | ICD-10-CM | POA: Diagnosis not present

## 2019-09-26 DIAGNOSIS — J069 Acute upper respiratory infection, unspecified: Secondary | ICD-10-CM | POA: Diagnosis not present

## 2019-10-14 ENCOUNTER — Ambulatory Visit: Payer: Medicare HMO

## 2019-10-14 DIAGNOSIS — R002 Palpitations: Secondary | ICD-10-CM | POA: Diagnosis not present

## 2019-10-21 DIAGNOSIS — Z86711 Personal history of pulmonary embolism: Secondary | ICD-10-CM | POA: Diagnosis not present

## 2019-10-21 DIAGNOSIS — Z8673 Personal history of transient ischemic attack (TIA), and cerebral infarction without residual deficits: Secondary | ICD-10-CM | POA: Diagnosis not present

## 2019-10-21 DIAGNOSIS — G4089 Other seizures: Secondary | ICD-10-CM | POA: Diagnosis not present

## 2019-10-21 DIAGNOSIS — F3181 Bipolar II disorder: Secondary | ICD-10-CM | POA: Diagnosis not present

## 2019-10-21 DIAGNOSIS — I639 Cerebral infarction, unspecified: Secondary | ICD-10-CM | POA: Diagnosis not present

## 2019-10-21 DIAGNOSIS — I2699 Other pulmonary embolism without acute cor pulmonale: Secondary | ICD-10-CM | POA: Diagnosis not present

## 2019-10-21 DIAGNOSIS — I10 Essential (primary) hypertension: Secondary | ICD-10-CM | POA: Diagnosis not present

## 2019-10-21 DIAGNOSIS — E782 Mixed hyperlipidemia: Secondary | ICD-10-CM | POA: Diagnosis not present

## 2019-11-05 ENCOUNTER — Encounter: Payer: Self-pay | Admitting: Cardiology

## 2019-11-05 ENCOUNTER — Ambulatory Visit (INDEPENDENT_AMBULATORY_CARE_PROVIDER_SITE_OTHER): Payer: Medicare HMO | Admitting: Cardiology

## 2019-11-05 VITALS — BP 124/70 | HR 72 | Temp 96.5°F | Ht 67.0 in | Wt 202.0 lb

## 2019-11-05 DIAGNOSIS — R55 Syncope and collapse: Secondary | ICD-10-CM

## 2019-11-05 NOTE — Patient Instructions (Signed)
Medication Instructions:  STOP AMLODIPINE   Labwork: NONE  Testing/Procedures: NONE  Follow-Up: Your physician recommends that you schedule a follow-up appointment in: NURSE 1 WEEK  Your physician recommends that you schedule a follow-up appointment in: 1 MONTH VIRTUAL VISIT     Any Other Special Instructions Will Be Listed Below (If Applicable).  PLEASE BRING MEDICATIONS TO NURSE VISIT NEXT WEEK    If you need a refill on your cardiac medications before your next appointment, please call your pharmacy.

## 2019-11-05 NOTE — Progress Notes (Signed)
Clinical Summary Jennifer Jacobs is a 74 y.o.female seen today for the following medical problems   1. Syncope - seen in ER 05/2019 with syncope - fall complicated by fibular fracture - found to have UTI at the time  - reports intermittent episodes of syncope since ER visit - whiles standing gets dizzy, everything just went black. LOC  - difficult historian, unable to get more specific detials    - orthostatics todaynegative, though soft bp's at baseline high 90s - still with ongoing episodes of dizziness. Last passing out about 1.5 months ago - typically occurs when getting up and walking.   - water bottles x 2, kool aid/lemonade x 4, occasional sodas.    2. Chest pain - admit 12/2017 with chest pain. Elevated troponins Lexiscan at that time without ischemia. Found to have bilateral PEs - no recent symptoms    3. Pulmonary embolism - bilateral PEs 12/2017 - echo with LVEF 60-65%, no WMAs, grade I diastoilc dysfunction. PASP 57, mild RV dysfunction   - no prior history of blood clots. From history appears to be unprovoked.  - no bleeding on eliquis     Past Medical History:  Diagnosis Date  . Acute blood loss anemia   . AKI (acute kidney injury) ([place]) 01/11/2018  . Alcohol abuse 07/05/2014  . Allergy   . Anxiety   . Anxiety and depression   . Arthritis   . Cognitive deficit due to recent stroke 11/22/2016  . COPD (chronic obstructive pulmonary disease) ([place])   . Depression   . Dyslipidemia   . Fibromyalgia   . Hepatic cyst   . Hyperlipidemia   . Hypertension   . Leukocytosis   . Liver cyst 03/18/2014   01/26/2014 ultrasound Hypoechoic structure adjacent to gallbladder right lobe liver,  measuring 29 x 18 x 17 mm, possibly septated cyst    . MI (myocardial infarction) ([place]) 2007   By patient report, not substantiated by recent nuclear stress test.  . Neuromuscular disorder ([place])   . Seizure disorder ([place])    none since 2002  . Seizures ([place])   .  Smoker unmotivated to quit    Quit for 3 years and then restarted  . Substance abuse ([place])    ETOH  . Vision abnormalities      Allergies  Allergen Reactions  . Aspirin Nausea Only  . Prozac [Fluoxetine Hcl] Other (See Comments)    Headaches     Current Outpatient Medications  Medication Sig Dispense Refill  . amLODipine (NORVASC) 5 MG tablet Take 1 tablet by mouth once daily 30 tablet 0  . apixaban (ELIQUIS) 5 MG TABS tablet Take 5 mg by mouth daily.    Marland Kitchen BESIVANCE 0.6 % SUSP Place 1 drop into the left eye 3 (three) times daily.   1  . DUREZOL 0.05 % EMUL Place 1 drop into the left eye 3 (three) times daily.   1  . escitalopram (LEXAPRO) 20 MG tablet Take 1 tablet (20 mg total) by mouth 2 (two) times daily. 60 tablet 2  . gabapentin (NEURONTIN) 800 MG tablet Take 800 mg by mouth 3 (three) times daily.     Marland Kitchen lamoTRIgine (LAMICTAL) 100 MG tablet Take 1 tablet (100 mg total) by mouth 2 (two) times daily. 180 tablet 1  . levETIRAcetam (KEPPRA) 500 MG tablet Take 1 tablet (500 mg total) 2 (two) times daily by mouth. 180 tablet 1  . metoprolol tartrate (LOPRESSOR) 50 MG tablet Take 1 tablet  by mouth twice daily 180 tablet 3  . nicotine (NICODERM CQ - DOSED IN MG/24 HR) 7 mg/24hr patch Place 1 patch (7 mg total) onto the skin daily. (Patient taking differently: Place 7 mg onto the skin daily as needed (smoke cessation). ) 28 patch 0  . olmesartan (BENICAR) 20 MG tablet Take 1 tablet (20 mg total) by mouth daily. 30 tablet 6  . oxybutynin (DITROPAN-XL) 10 MG 24 hr tablet Take 1 tablet (10 mg total) by mouth at bedtime. 90 tablet 0  . PROLENSA 0.07 % SOLN Place 1 drop into the left eye daily.   1  . risperiDONE (RISPERDAL) 0.5 MG tablet Take 1 tablet (0.5 mg total) by mouth at bedtime. 90 tablet 2  . traMADol (ULTRAM) 50 MG tablet Take 1 tablet (50 mg total) by mouth every 6 (six) hours as needed. 20 tablet 0  . traZODone (DESYREL) 50 MG tablet Take 1 tablet (50 mg total) by mouth at  bedtime. 90 tablet 2   No current facility-administered medications for this visit.     Past Surgical History:  Procedure Laterality Date  . CATARACT EXTRACTION W/PHACO Right 06/30/2018   Procedure: CATARACT EXTRACTION PHACO AND INTRAOCULAR LENS PLACEMENT RIGHT EYE;  Surgeon: Tonny , MD;  Location: [facility];  Service: Ophthalmology;  Laterality: Right;  CDE: 9.90  . CATARACT EXTRACTION W/PHACO Left 07/14/2018   Procedure: CATARACT EXTRACTION PHACO AND INTRAOCULAR LENS PLACEMENT (IOC);  Surgeon: Tonny , MD;  Location: [facility];  Service: Ophthalmology;  Laterality: Left;  CDE: 6.96  . CRANIOTOMY Left 11/19/2016   Procedure: CRANIOTOMY INTRACRANIAL FOR  ANEURYSM CLIPPING;  Surgeon: Kevan Ny Ditty, MD;  Location: [facility];  Service: Neurosurgery;  Laterality: Left;  . ELBOW ARTHROPLASTY Left    rods  . Exercise tolerance test  04/27/2013   CPET-MET: Submaximal effort (0.96, with goal of greater than 1.09) -- patient states that her effort was limited due to knee pain.  This makes the rest of the interpretation difficult: Peak VO2 - 10.5 (59% moderate), peak 02-pulse -  7..11 (low); heart rate 114 (73% -chronic incompetence considered);; PFTs normal   . MOUTH SURGERY    . NM MYOVIEW LTD  05/19/2013   LexiScan: EF 80%, no evidence of ischemia or infarction  . TRANSTHORACIC ECHOCARDIOGRAM  03/25/2013   EF 55-60%, no regional wall motion abnormalities; normal diastolic parameters, normal pulmonary pressures.  No valvular lesions noted.  Essentially normal echo      Allergies  Allergen Reactions  . Aspirin Nausea Only  . Prozac [Fluoxetine Hcl] Other (See Comments)    Headaches      Family History  Problem Relation Age of Onset  . Heart attack Father 43  . Hypertension Father   . Alcohol abuse Father   . Heart attack Maternal Grandmother   . Cancer Maternal Grandfather        type unknown  . Cancer Paternal Grandmother        type unknown  . Depression Mother   . Alcohol  abuse Mother   . Ovarian cancer Paternal Aunt   . Cancer Paternal Aunt        ovarian  . Prostate cancer Paternal Uncle   . Stomach cancer Paternal Aunt   . Diabetes Daughter   . Heart disease Daughter 62       heart attack  . Depression Sister   . Alcohol abuse Brother   . Depression Sister   . Alcohol abuse Sister   . Depression  Sister   . Alcohol abuse Sister   . Alcohol abuse Brother   . Alcohol abuse Son      Social History Ms. Yamashiro reports that she has been smoking cigarettes. She has been smoking about 0.50 packs per day. She has never used smokeless tobacco. Ms. Pandit reports current alcohol use.   Review of Systems CONSTITUTIONAL: No weight loss, fever, chills, weakness or fatigue.  HEENT: Eyes: No visual loss, blurred vision, double vision or yellow sclerae.No hearing loss, sneezing, congestion, runny nose or sore throat.  SKIN: No rash or itching.  CARDIOVASCULAR: perhpi RESPIRATORY: No shortness of breath, cough or sputum.  GASTROINTESTINAL: No anorexia, nausea, vomiting or diarrhea. No abdominal pain or blood.  GENITOURINARY: No burning on urination, no polyuria NEUROLOGICAL: No headache, dizziness, syncope, paralysis, ataxia, numbness or tingling in the extremities. No change in bowel or bladder control.  MUSCULOSKELETAL: No muscle, back pain, joint pain or stiffness.  LYMPHATICS: No enlarged nodes. No history of splenectomy.  PSYCHIATRIC: No history of depression or anxiety.  ENDOCRINOLOGIC: No reports of sweating, cold or heat intolerance. No polyuria or polydipsia.  Marland Kitchen   Physical Examination Today's Vitals   11/05/19 1125  BP: 124/70  Pulse: 72  Temp: (!) 96.5 F (35.8 C)  SpO2: 92%  Weight: 202 lb (91.6 kg)  Height: '5\' 7"'  (1.702 m)   Body mass index is 31.64 kg/m.  Gen: resting comfortably, no acute distress HEENT: no scleral icterus, pupils equal round and reactive, no palptable cervical adenopathy,  CV: RRR, no mrg, no jvd Resp: Clear to  auscultation bilaterally GI: abdomen is soft, non-tender, non-distended, normal bowel sounds, no hepatosplenomegaly MSK: extremities are warm, no edema.  Skin: warm, no rash Neuro:  no focal deficits Psych: appropriate affect   Diagnostic Studies  TTE: 01/11/18  Study Conclusions  - Left ventricle: The cavity size was normal. Wall thickness was increased in a pattern of moderate LVH. Systolic function was normal. The estimated ejection fraction was in the range of 60% to 65%. Wall motion was normal; there were no regional wall motion abnormalities. Doppler parameters are consistent with abnormal left ventricular relaxation (grade 1 diastolic dysfunction). The E/e&' ratio is <8, suggesting normal LV filling pressure. - Left atrium: The atrium was normal in size. - Right ventricle: The cavity size was mildly dilated. Mildly reduced systolic function. - Right atrium: The atrium was mildly dilated. - Tricuspid valve: There was mild regurgitation. - Pulmonary arteries: PA peak pressure: 57 mm Hg (S). - Inferior vena cava: The vessel was normal in size. The respirophasic diameter changes were in the normal range (>= 50%), consistent with normal central venous pressure.  Impressions:  - Compared to a prior study in 03/2013, the LVEF is higher at 60-65%. The RVSP is also higher at 57 mmHg.  Lexiscan: 01/13/18   There was no ST segment deviation noted during stress.  T wave inversion was noted during stress in the V1, V2, V4, V3, V5, V6, II, III and aVF leads.  Defect 1: There is a medium defect of severe severity present in the basal inferior and mid inferior location.  This is a low risk study.  Nuclear stress EF: 78%.  The left ventricular ejection fraction is hyperdynamic (>65%).    Assessment and Plan  1. Syncope - unclear etiology, somewhat difficult historian  - awaiting 30 day event monitor - stop norvasc for now for possible  orthostatic syncope     [name], M.D.

## 2019-11-09 ENCOUNTER — Ambulatory Visit: Payer: Medicare HMO

## 2019-11-12 ENCOUNTER — Encounter: Payer: Self-pay | Admitting: Internal Medicine

## 2019-11-23 DIAGNOSIS — Z86711 Personal history of pulmonary embolism: Secondary | ICD-10-CM | POA: Diagnosis not present

## 2019-11-23 DIAGNOSIS — I639 Cerebral infarction, unspecified: Secondary | ICD-10-CM | POA: Diagnosis not present

## 2019-11-23 DIAGNOSIS — I10 Essential (primary) hypertension: Secondary | ICD-10-CM | POA: Diagnosis not present

## 2019-11-23 DIAGNOSIS — E782 Mixed hyperlipidemia: Secondary | ICD-10-CM | POA: Diagnosis not present

## 2019-11-23 DIAGNOSIS — I2699 Other pulmonary embolism without acute cor pulmonale: Secondary | ICD-10-CM | POA: Diagnosis not present

## 2019-11-23 DIAGNOSIS — G4089 Other seizures: Secondary | ICD-10-CM | POA: Diagnosis not present

## 2019-11-23 DIAGNOSIS — F3181 Bipolar II disorder: Secondary | ICD-10-CM | POA: Diagnosis not present

## 2019-11-23 DIAGNOSIS — Z8673 Personal history of transient ischemic attack (TIA), and cerebral infarction without residual deficits: Secondary | ICD-10-CM | POA: Diagnosis not present

## 2019-12-09 ENCOUNTER — Telehealth: Payer: Medicare HMO | Admitting: Cardiology

## 2019-12-09 ENCOUNTER — Encounter: Payer: Self-pay | Admitting: Cardiology

## 2019-12-09 NOTE — Progress Notes (Unsigned)
{Choose 1 Note Type (Video or Telephone):[id number]}   The patient was identified using 2 identifiers.  Date:  12/09/2019   ID:  Jennifer Jacobs, DOB 31-Aug-1945, MRN [id number]  {Patient [phone]::"Home"} {Provider [phone]::"Home"}  PCP:  Celene Squibb, MD  Cardiologist:  Dorris Carnes, MD *** Electrophysiologist:  None   Evaluation Performed:  {Choose Visit Type:[id number]::"Follow-Up Visit"}  Chief Complaint:  ***  History of Present Illness:    Jennifer Jacobs is a 74 y.o. female with ***   1. Syncope - seen in ER 05/2019 with syncope - fall complicated by fibular fracture - found to have UTIat the time  - reports intermittent episodes of syncope since ER visit - whiles standing gets dizzy, everything just went black. LOC  - difficult historian, unable to get more specific detials    - orthostatics todaynegative, though soft bp's at baseline high 90s - still with ongoing episodes of dizziness. Last passing out about 1.5 months ago - typically occurs when getting up and walking.   - water bottles x 2, kool aid/lemonade x 4, occasional sodas.    2. Chest pain - admit 12/2017 with chest pain.Elevated troponinsLexiscan at that time without ischemia. Found to have bilateral PEs - no recent chest pain, can have some SOB.    3. Pulmonary embolism - bilateral PEs 12/2017 - echo with LVEF 60-65%, no WMAs, grade I diastoilc dysfunction. PASP 57, mild RV dysfunction   - no prior history of blood clots. From history appears to be unprovoked.  - no bleeding on eliquis.  - ongoing SOB. Some dizziness.   The patient {does/does not:200015} have symptoms concerning for COVID-19 infection (fever, chills, cough, or new shortness of breath).    Past Medical History:  Diagnosis Date  . Acute blood loss anemia   . AKI (acute kidney injury) ([place]) 01/11/2018  . Alcohol abuse 07/05/2014  . Allergy   . Anxiety   . Anxiety and depression   .  Arthritis   . Cognitive deficit due to recent stroke 11/22/2016  . COPD (chronic obstructive pulmonary disease) ([place])   . Depression   . Dyslipidemia   . Fibromyalgia   . Hepatic cyst   . Hyperlipidemia   . Hypertension   . Leukocytosis   . Liver cyst 03/18/2014   01/26/2014 ultrasound Hypoechoic structure adjacent to gallbladder right lobe liver,  measuring 29 x 18 x 17 mm, possibly septated cyst    . MI (myocardial infarction) ([place]) 2007   By patient report, not substantiated by recent nuclear stress test.  . Neuromuscular disorder ([place])   . Seizure disorder ([place])    none since 2002  . Seizures ([place])   . Smoker unmotivated to quit    Quit for 3 years and then restarted  . Substance abuse ([place])    ETOH  . Vision abnormalities    Past Surgical History:  Procedure Laterality Date  . CATARACT EXTRACTION W/PHACO Right 06/30/2018   Procedure: CATARACT EXTRACTION PHACO AND INTRAOCULAR LENS PLACEMENT RIGHT EYE;  Surgeon: Tonny , MD;  Location: [facility];  Service: Ophthalmology;  Laterality: Right;  CDE: 9.90  . CATARACT EXTRACTION W/PHACO Left 07/14/2018   Procedure: CATARACT EXTRACTION PHACO AND INTRAOCULAR LENS PLACEMENT (IOC);  Surgeon: Tonny , MD;  Location: [facility];  Service: Ophthalmology;  Laterality: Left;  CDE: 6.96  . CRANIOTOMY Left 11/19/2016   Procedure: CRANIOTOMY INTRACRANIAL FOR  ANEURYSM CLIPPING;  Surgeon: Kevan Ny Ditty, MD;  Location: [facility];  Service: Neurosurgery;  Laterality: Left;  .  ELBOW ARTHROPLASTY Left    rods  . Exercise tolerance test  04/27/2013   CPET-MET: Submaximal effort (0.96, with goal of greater than 1.09) -- patient states that her effort was limited due to knee pain.  This makes the rest of the interpretation difficult: Peak VO2 - 10.5 (59% moderate), peak 02-pulse -  7..11 (low); heart rate 114 (73% -chronic incompetence considered);; PFTs normal   . MOUTH SURGERY    . NM MYOVIEW LTD  05/19/2013   LexiScan: EF 80%, no evidence of  ischemia or infarction  . TRANSTHORACIC ECHOCARDIOGRAM  03/25/2013   EF 55-60%, no regional wall motion abnormalities; normal diastolic parameters, normal pulmonary pressures.  No valvular lesions noted.  Essentially normal echo      No outpatient medications have been marked as taking for the 12/09/19 encounter (Appointment) with [name], MD.     Allergies:   Aspirin and Prozac [fluoxetine hcl]   Social History   Tobacco Use  . Smoking status: Current Every Day Smoker    Packs/day: 0.50    Types: Cigarettes  . Smokeless tobacco: Never Used  . Tobacco comment: one pack every 3 months  Substance Use Topics  . Alcohol use: Yes    Comment: occasionally  . Drug use: Yes    Frequency: 2.0 times per week    Types: Marijuana    Comment: last smoked 1 week ago     Family Hx: The patient's family history includes Alcohol abuse in her brother, brother, father, mother, sister, sister, and son; Cancer in her maternal grandfather, paternal aunt, and paternal grandmother; Depression in her mother, sister, sister, and sister; Diabetes in her daughter; Heart attack in her maternal grandmother; Heart attack (age of onset: 4) in her father; Heart disease (age of onset: 61) in her daughter; Hypertension in her father; Ovarian cancer in her paternal aunt; Prostate cancer in her paternal uncle; Stomach cancer in her paternal aunt.  ROS:   Please see the history of present illness.    *** All other systems reviewed and are negative.   Prior CV studies:   The following studies were reviewed today:  TTE: 01/11/18  Study Conclusions  - Left ventricle: The cavity size was normal. Wall thickness was increased in a pattern of moderate LVH. Systolic function was normal. The estimated ejection fraction was in the range of 60% to 65%. Wall motion was normal; there were no regional wall motion abnormalities. Doppler parameters are consistent with abnormal left ventricular  relaxation (grade 1 diastolic dysfunction). The E/e&' ratio is <8, suggesting normal LV filling pressure. - Left atrium: The atrium was normal in size. - Right ventricle: The cavity size was mildly dilated. Mildly reduced systolic function. - Right atrium: The atrium was mildly dilated. - Tricuspid valve: There was mild regurgitation. - Pulmonary arteries: PA peak pressure: 57 mm Hg (S). - Inferior vena cava: The vessel was normal in size. The respirophasic diameter changes were in the normal range (>= 50%), consistent with normal central venous pressure.  Impressions:  - Compared to a prior study in 03/2013, the LVEF is higher at 60-65%. The RVSP is also higher at 57 mmHg.  Lexiscan: 01/13/18   There was no ST segment deviation noted during stress.  T wave inversion was noted during stress in the V1, V2, V4, V3, V5, V6, II, III and aVF leads.  Defect 1: There is a medium defect of severe severity present in the basal inferior and mid inferior location.  This is  a low risk study.  Nuclear stress EF: 78%.  The left ventricular ejection fraction is hyperdynamic (>65%).  Labs/Other Tests and Data Reviewed:    EKG:  {EKG/Telemetry Strips Reviewed:[phone]}  Recent Labs: 06/08/2019: BUN 13; Creatinine, Ser 1.26; Hemoglobin 13.0; Platelets 272; Potassium 3.9; Sodium 140   Recent Lipid Panel No results found for: CHOL, TRIG, HDL, CHOLHDL, LDLCALC, LDLDIRECT  Wt Readings from Last 3 Encounters:  11/05/19 202 lb (91.6 kg)  09/16/19 215 lb (97.5 kg)  06/08/19 190 lb (86.2 kg)     Objective:    Vital Signs:  There were no vitals taken for this visit.   {HeartCare Virtual Exam (Optional):[phone]::"VITAL SIGNS:  reviewed"}  ASSESSMENT & PLAN:    1. Syncope -unclear etiology, somewhat difficult historian  - obtain 30 day monitor to evaluate for arrhythmia - f/u 6 weeks, obtain orthostatics at that time. Possible echo pending monitor results.  2.  Chest pain - negative cardiac workup during recent admission, was found to have a PE - no rececent symptoms  3. Pulmonary embolism - from what I can tell from her history and records appears to be unprovoked, I don't see where this was clearly documented during her admission - continue indefinite anticoagulation  COVID-19 Education: The signs and symptoms of COVID-19 were discussed with the patient and how to seek care for testing (follow up with PCP or arrange E-visit).  ***The importance of social distancing was discussed today.  Time:   Today, I have spent *** minutes with the patient with telehealth technology discussing the above problems.     Medication Adjustments/Labs and Tests Ordered: Current medicines are reviewed at length with the patient today.  Concerns regarding medicines are outlined above.   Tests Ordered: No orders of the defined types were placed in this encounter.   Medication Changes: No orders of the defined types were placed in this encounter.   Follow Up:  {F/U Format:[phone]} {follow up:15908}  Signed, Carlyle Dolly, MD  12/09/2019 8:07 AM    [place]

## 2019-12-14 DIAGNOSIS — G4089 Other seizures: Secondary | ICD-10-CM | POA: Diagnosis not present

## 2019-12-14 DIAGNOSIS — Z6830 Body mass index (BMI) 30.0-30.9, adult: Secondary | ICD-10-CM | POA: Diagnosis not present

## 2019-12-14 DIAGNOSIS — E782 Mixed hyperlipidemia: Secondary | ICD-10-CM | POA: Diagnosis not present

## 2019-12-14 DIAGNOSIS — F3181 Bipolar II disorder: Secondary | ICD-10-CM | POA: Diagnosis not present

## 2019-12-14 DIAGNOSIS — D649 Anemia, unspecified: Secondary | ICD-10-CM | POA: Diagnosis not present

## 2019-12-14 DIAGNOSIS — S82831D Other fracture of upper and lower end of right fibula, subsequent encounter for closed fracture with routine healing: Secondary | ICD-10-CM | POA: Diagnosis not present

## 2019-12-14 DIAGNOSIS — E7849 Other hyperlipidemia: Secondary | ICD-10-CM | POA: Diagnosis not present

## 2019-12-14 DIAGNOSIS — S92311A Displaced fracture of first metatarsal bone, right foot, initial encounter for closed fracture: Secondary | ICD-10-CM | POA: Diagnosis not present

## 2019-12-14 DIAGNOSIS — Z0000 Encounter for general adult medical examination without abnormal findings: Secondary | ICD-10-CM | POA: Diagnosis not present

## 2019-12-14 DIAGNOSIS — R7301 Impaired fasting glucose: Secondary | ICD-10-CM | POA: Diagnosis not present

## 2019-12-14 DIAGNOSIS — Z0001 Encounter for general adult medical examination with abnormal findings: Secondary | ICD-10-CM | POA: Diagnosis not present

## 2019-12-14 DIAGNOSIS — G40909 Epilepsy, unspecified, not intractable, without status epilepticus: Secondary | ICD-10-CM | POA: Diagnosis not present

## 2019-12-14 DIAGNOSIS — D509 Iron deficiency anemia, unspecified: Secondary | ICD-10-CM | POA: Diagnosis not present

## 2019-12-14 DIAGNOSIS — R944 Abnormal results of kidney function studies: Secondary | ICD-10-CM | POA: Diagnosis not present

## 2019-12-14 DIAGNOSIS — S92324D Nondisplaced fracture of second metatarsal bone, right foot, subsequent encounter for fracture with routine healing: Secondary | ICD-10-CM | POA: Diagnosis not present

## 2019-12-17 DIAGNOSIS — L853 Xerosis cutis: Secondary | ICD-10-CM | POA: Diagnosis not present

## 2019-12-17 DIAGNOSIS — R7301 Impaired fasting glucose: Secondary | ICD-10-CM | POA: Diagnosis not present

## 2019-12-17 DIAGNOSIS — Z712 Person consulting for explanation of examination or test findings: Secondary | ICD-10-CM | POA: Diagnosis not present

## 2019-12-17 DIAGNOSIS — N182 Chronic kidney disease, stage 2 (mild): Secondary | ICD-10-CM | POA: Diagnosis not present

## 2019-12-17 DIAGNOSIS — M25562 Pain in left knee: Secondary | ICD-10-CM | POA: Diagnosis not present

## 2019-12-18 ENCOUNTER — Other Ambulatory Visit: Payer: Self-pay | Admitting: *Deleted

## 2019-12-18 ENCOUNTER — Encounter: Payer: Self-pay | Admitting: Cardiology

## 2019-12-18 ENCOUNTER — Telehealth (INDEPENDENT_AMBULATORY_CARE_PROVIDER_SITE_OTHER): Payer: Medicare HMO | Admitting: Cardiology

## 2019-12-18 ENCOUNTER — Ambulatory Visit (INDEPENDENT_AMBULATORY_CARE_PROVIDER_SITE_OTHER): Payer: Medicare HMO

## 2019-12-18 VITALS — Ht 68.5 in | Wt 195.0 lb

## 2019-12-18 DIAGNOSIS — R002 Palpitations: Secondary | ICD-10-CM

## 2019-12-18 DIAGNOSIS — R55 Syncope and collapse: Secondary | ICD-10-CM

## 2019-12-18 MED ORDER — METOPROLOL TARTRATE 25 MG PO TABS: 25.0000 mg | ORAL_TABLET | Freq: Two times a day (BID) | ORAL | 3 refills | Status: DC

## 2019-12-18 NOTE — Patient Instructions (Signed)
Medication Instructions:  Your physician has recommended you make the following change in your medication:  Decrease Lopressor to 25 mg Two Times Daily   *If you need a refill on your cardiac medications before your next appointment, please call your pharmacy*   Lab Work: NONE   If you have labs (blood work) drawn today and your tests are completely normal, you will receive your results only by: Marland Kitchen MyChart Message (if you have MyChart) OR . A paper copy in the mail If you have any lab test that is abnormal or we need to change your treatment, we will call you to review the results.   Testing/Procedures: NONE    Follow-Up: At [facility], you and your health needs are our priority.  As part of our continuing mission to provide you with exceptional heart care, we have created designated Provider Care Teams.  These Care Teams include your primary Cardiologist (physician) and Advanced Practice Providers (APPs -  Physician Assistants and Nurse Practitioners) who all work together to provide you with the care you need, when you need it.  We recommend signing up for the patient portal called "MyChart".  Sign up information is provided on this After Visit Summary.  MyChart is used to connect with patients for Virtual Visits (Telemedicine).  Patients are able to view lab/test results, encounter notes, upcoming appointments, etc.  Non-urgent messages can be sent to your provider as well.   To learn more about what you can do with MyChart, go to [email or web address].    Your next appointment:   3 week(s)  The format for your next appointment:   In Person  Provider:   Dina Rich, MD   Other Instructions Thank you for choosing [facility]!

## 2019-12-18 NOTE — Progress Notes (Signed)
Virtual Visit via Telephone Note   This visit type was conducted due to national recommendations for restrictions regarding the COVID-19 Pandemic (e.g. social distancing) in an effort to limit this patient's exposure and mitigate transmission in our community.  Due to her co-morbid illnesses, this patient is at least at moderate risk for complications without adequate follow up.  This format is felt to be most appropriate for this patient at this time.  The patient did not have access to video technology/had technical difficulties with video requiring transitioning to audio format only (telephone).  All issues noted in this document were discussed and addressed.  No physical exam could be performed with this format.  Please refer to the patient's chart for her  consent to telehealth for [facility].   The patient was identified using 2 identifiers.  Date:  12/18/2019   ID:  Jennifer Jacobs, DOB Sep 09, 1945, MRN [id number]  Patient Location: Home Provider Location: Office  PCP:  Celene Squibb, MD  Cardiologist:  Dorris Carnes, MD  Electrophysiologist:  None   Evaluation Performed:  Follow-Up Visit  Chief Complaint:  FOllow up visit  History of Present Illness:    Jennifer Jacobs is a 74 y.o. female seen today for the following medical problems   1. Syncope - seen in ER 05/2019 with syncope - fall complicated by fibular fracture - found to have UTIat the time  - reports intermittent episodes of syncope since ER visit - whiles standing gets dizzy, everything just went black. LOC  - difficult historian, unable to get more specific detials    - orthostatics todaynegative, though soft bp's at baseline high 90s - still with ongoing episodes of dizziness. Last passing out about 1.5 months ago - typically occurs when getting up and walking.   - water bottles x 2, kool aid/lemonade x 4, occasional sodas.    - some ongoing dizziness - occurs with standing for long  periods.  - she wore the monitor for 30 days which was benign - ongoign symptoms   2. Chest pain - admit 12/2017 with chest pain.Elevated troponinsLexiscan at that time without ischemia. Found to have bilateral PEs - no recent symptoms    3. Pulmonary embolism - bilateral PEs 12/2017 - echo with LVEF 60-65%, no WMAs, grade I diastoilc dysfunction. PASP 57, mild RV dysfunction  - no prior history of blood clots. From history appears to be unprovoked.  - no bleeding on eliquis    The patient does not have symptoms concerning for COVID-19 infection (fever, chills, cough, or new shortness of breath).    Past Medical History:  Diagnosis Date  . Acute blood loss anemia   . AKI (acute kidney injury) ([place]) 01/11/2018  . Alcohol abuse 07/05/2014  . Allergy   . Anxiety   . Anxiety and depression   . Arthritis   . Cognitive deficit due to recent stroke 11/22/2016  . COPD (chronic obstructive pulmonary disease) ([place])   . Depression   . Dyslipidemia   . Fibromyalgia   . Hepatic cyst   . Hyperlipidemia   . Hypertension   . Leukocytosis   . Liver cyst 03/18/2014   01/26/2014 ultrasound Hypoechoic structure adjacent to gallbladder right lobe liver,  measuring 29 x 18 x 17 mm, possibly septated cyst    . MI (myocardial infarction) ([place]) 2007   By patient report, not substantiated by recent nuclear stress test.  . Neuromuscular disorder ([place])   . Seizure disorder ([place])  none since 2002  . Seizures ([place])   . Smoker unmotivated to quit    Quit for 3 years and then restarted  . Substance abuse ([place])    ETOH  . Vision abnormalities    Past Surgical History:  Procedure Laterality Date  . CATARACT EXTRACTION W/PHACO Right 06/30/2018   Procedure: CATARACT EXTRACTION PHACO AND INTRAOCULAR LENS PLACEMENT RIGHT EYE;  Surgeon: Tonny , MD;  Location: [facility];  Service: Ophthalmology;  Laterality: Right;  CDE: 9.90  . CATARACT EXTRACTION W/PHACO Left 07/14/2018   Procedure:  CATARACT EXTRACTION PHACO AND INTRAOCULAR LENS PLACEMENT (IOC);  Surgeon: Tonny , MD;  Location: [facility];  Service: Ophthalmology;  Laterality: Left;  CDE: 6.96  . CRANIOTOMY Left 11/19/2016   Procedure: CRANIOTOMY INTRACRANIAL FOR  ANEURYSM CLIPPING;  Surgeon: Kevan Ny Ditty, MD;  Location: [facility];  Service: Neurosurgery;  Laterality: Left;  . ELBOW ARTHROPLASTY Left    rods  . Exercise tolerance test  04/27/2013   CPET-MET: Submaximal effort (0.96, with goal of greater than 1.09) -- patient states that her effort was limited due to knee pain.  This makes the rest of the interpretation difficult: Peak VO2 - 10.5 (59% moderate), peak 02-pulse -  7..11 (low); heart rate 114 (73% -chronic incompetence considered);; PFTs normal   . MOUTH SURGERY    . NM MYOVIEW LTD  05/19/2013   LexiScan: EF 80%, no evidence of ischemia or infarction  . TRANSTHORACIC ECHOCARDIOGRAM  03/25/2013   EF 55-60%, no regional wall motion abnormalities; normal diastolic parameters, normal pulmonary pressures.  No valvular lesions noted.  Essentially normal echo      Current Meds  Medication Sig  . apixaban (ELIQUIS) 5 MG TABS tablet Take 5 mg by mouth daily.  Marland Kitchen escitalopram (LEXAPRO) 20 MG tablet Take 1 tablet (20 mg total) by mouth 2 (two) times daily.  Marland Kitchen gabapentin (NEURONTIN) 800 MG tablet Take 800 mg by mouth 3 (three) times daily.   Marland Kitchen lamoTRIgine (LAMICTAL) 100 MG tablet Take 1 tablet (100 mg total) by mouth 2 (two) times daily.  . metoprolol tartrate (LOPRESSOR) 50 MG tablet Take 1 tablet by mouth twice daily  . olmesartan (BENICAR) 20 MG tablet Take 1 tablet (20 mg total) by mouth daily.  Marland Kitchen oxybutynin (DITROPAN-XL) 10 MG 24 hr tablet Take 1 tablet (10 mg total) by mouth at bedtime.  . risperiDONE (RISPERDAL) 0.5 MG tablet Take 1 tablet (0.5 mg total) by mouth at bedtime.  . traMADol (ULTRAM) 50 MG tablet Take 1 tablet (50 mg total) by mouth every 6 (six) hours as needed.  . traZODone (DESYREL) 50 MG tablet  Take 1 tablet (50 mg total) by mouth at bedtime.     Allergies:   Aspirin and Prozac [fluoxetine hcl]   Social History   Tobacco Use  . Smoking status: Current Every Day Smoker    Packs/day: 0.50    Types: Cigarettes  . Smokeless tobacco: Never Used  . Tobacco comment: one pack every 3 months  Substance Use Topics  . Alcohol use: Not Currently    Comment: occasionally  . Drug use: Yes    Frequency: 2.0 times per week    Types: Marijuana    Comment: last smoked 1 week ago     Family Hx: The patient's family history includes Alcohol abuse in her brother, brother, father, mother, sister, sister, and son; Cancer in her maternal grandfather, paternal aunt, and paternal grandmother; Depression in her mother, sister, sister, and sister; Diabetes in her  daughter; Heart attack in her maternal grandmother; Heart attack (age of onset: 38) in her father; Heart disease (age of onset: 47) in her daughter; Hypertension in her father; Ovarian cancer in her paternal aunt; Prostate cancer in her paternal uncle; Stomach cancer in her paternal aunt.  ROS:   Please see the history of present illness.    All other systems reviewed and are negative.   Prior CV studies:   The following studies were reviewed today:  TTE: 01/11/18  Study Conclusions  - Left ventricle: The cavity size was normal. Wall thickness was increased in a pattern of moderate LVH. Systolic function was normal. The estimated ejection fraction was in the range of 60% to 65%. Wall motion was normal; there were no regional wall motion abnormalities. Doppler parameters are consistent with abnormal left ventricular relaxation (grade 1 diastolic dysfunction). The E/e&' ratio is <8, suggesting normal LV filling pressure. - Left atrium: The atrium was normal in size. - Right ventricle: The cavity size was mildly dilated. Mildly reduced systolic function. - Right atrium: The atrium was mildly dilated. - Tricuspid  valve: There was mild regurgitation. - Pulmonary arteries: PA peak pressure: 57 mm Hg (S). - Inferior vena cava: The vessel was normal in size. The respirophasic diameter changes were in the normal range (>= 50%), consistent with normal central venous pressure.  Impressions:  - Compared to a prior study in 03/2013, the LVEF is higher at 60-65%. The RVSP is also higher at 57 mmHg.  Lexiscan: 01/13/18   There was no ST segment deviation noted during stress.  T wave inversion was noted during stress in the V1, V2, V4, V3, V5, V6, II, III and aVF leads.  Defect 1: There is a medium defect of severe severity present in the basal inferior and mid inferior location.  This is a low risk study.  Nuclear stress EF: 78%.  The left ventricular ejection fraction is hyperdynamic (>65%).  Labs/Other Tests and Data Reviewed:    EKG:  n/a  Recent Labs: 06/08/2019: BUN 13; Creatinine, Ser 1.26; Hemoglobin 13.0; Platelets 272; Potassium 3.9; Sodium 140   Recent Lipid Panel No results found for: CHOL, TRIG, HDL, CHOLHDL, LDLCALC, LDLDIRECT  Wt Readings from Last 3 Encounters:  12/18/19 195 lb (88.5 kg)  12/09/19 210 lb (95.3 kg)  11/05/19 202 lb (91.6 kg)     Objective:    Vital Signs:  Ht 5' 8.5" (1.74 m)   Wt 195 lb (88.5 kg)   BMI 29.22 kg/m    Normal affect. Normal speech pattern and tone. Comfortable, no apparent distress.   ASSESSMENT & PLAN:    1. Syncope -unclear etiology, somewhat difficult historian  - benign cardiac event monitor - recheck orthostatics at next visit - try lowering lopressor to 75m bid for possibly orthostatic syncope  COVID-19 Education: The signs and symptoms of COVID-19 were discussed with the patient and how to seek care for testing (follow up with PCP or arrange E-visit).  The importance of social distancing was discussed today.  Time:   Today, I have spent 13 minutes with the patient with telehealth technology discussing the above  problems.     Medication Adjustments/Labs and Tests Ordered: Current medicines are reviewed at length with the patient today.  Concerns regarding medicines are outlined above.   Tests Ordered: No orders of the defined types were placed in this encounter.   Medication Changes: No orders of the defined types were placed in this encounter.   Follow Up:  Either In Person or Virtual in 3 week(s)  Signed, Carlyle Dolly, MD  12/18/2019 2:01 PM    [facility]

## 2019-12-21 DIAGNOSIS — E785 Hyperlipidemia, unspecified: Secondary | ICD-10-CM | POA: Diagnosis not present

## 2019-12-21 DIAGNOSIS — I10 Essential (primary) hypertension: Secondary | ICD-10-CM | POA: Diagnosis not present

## 2020-01-07 ENCOUNTER — Encounter: Payer: Self-pay | Admitting: Cardiology

## 2020-01-07 ENCOUNTER — Ambulatory Visit: Payer: Medicare HMO | Admitting: Cardiology

## 2020-01-07 NOTE — Progress Notes (Deleted)
Clinical Summary Ms. Naples is a 74 y.o.female  1. Syncope - seen in ER 05/2019 with syncope - fall complicated by fibular fracture - found to have UTIat the time  - reports intermittent episodes of syncope since ER visit - whiles standing gets dizzy, everything just went black. LOC  - difficult historian, unable to get more specific detials    - orthostatics todaynegative, though soft bp's at baseline high 90s - still with ongoing episodes of dizziness. Last passing out about 1.5 months ago - typically occurs when getting up and walking.   - water bottles x 2, kool aid/lemonade x 4, occasional sodas.    11/2019 event monitor no significant arrhythmias  2. Chest pain - admit 12/2017 with chest pain.Elevated troponinsLexiscan at that time without ischemia. Found to have bilateral PEs - no recent symptoms    3. Pulmonary embolism - bilateral PEs 12/2017 - echo with LVEF 60-65%, no WMAs, grade I diastoilc dysfunction. PASP 57, mild RV dysfunction   - no prior history of blood clots. From history appears to be unprovoked.  - no bleeding on eliquis  Past Medical History:  Diagnosis Date  . Acute blood loss anemia   . AKI (acute kidney injury) ([place]) 01/11/2018  . Alcohol abuse 07/05/2014  . Allergy   . Anxiety   . Anxiety and depression   . Arthritis   . Cognitive deficit due to recent stroke 11/22/2016  . COPD (chronic obstructive pulmonary disease) ([place])   . Depression   . Dyslipidemia   . Fibromyalgia   . Hepatic cyst   . Hyperlipidemia   . Hypertension   . Leukocytosis   . Liver cyst 03/18/2014   01/26/2014 ultrasound Hypoechoic structure adjacent to gallbladder right lobe liver,  measuring 29 x 18 x 17 mm, possibly septated cyst    . MI (myocardial infarction) ([place]) 2007   By patient report, not substantiated by recent nuclear stress test.  . Neuromuscular disorder ([place])   . Seizure disorder ([place])    none since 2002  . Seizures ([place])   .  Smoker unmotivated to quit    Quit for 3 years and then restarted  . Substance abuse ([place])    ETOH  . Vision abnormalities      Allergies  Allergen Reactions  . Aspirin Nausea Only  . Prozac [Fluoxetine Hcl] Other (See Comments)    Headaches     Current Outpatient Medications  Medication Sig Dispense Refill  . apixaban (ELIQUIS) 5 MG TABS tablet Take 5 mg by mouth daily.    Marland Kitchen BESIVANCE 0.6 % SUSP Place 1 drop into the left eye 3 (three) times daily.   1  . DUREZOL 0.05 % EMUL Place 1 drop into the left eye 3 (three) times daily.   1  . escitalopram (LEXAPRO) 20 MG tablet Take 1 tablet (20 mg total) by mouth 2 (two) times daily. 60 tablet 2  . gabapentin (NEURONTIN) 800 MG tablet Take 800 mg by mouth 3 (three) times daily.     Marland Kitchen lamoTRIgine (LAMICTAL) 100 MG tablet Take 1 tablet (100 mg total) by mouth 2 (two) times daily. 180 tablet 1  . metoprolol tartrate (LOPRESSOR) 25 MG tablet Take 1 tablet (25 mg total) by mouth 2 (two) times daily. 180 tablet 3  . olmesartan (BENICAR) 20 MG tablet Take 1 tablet (20 mg total) by mouth daily. 30 tablet 6  . oxybutynin (DITROPAN-XL) 10 MG 24 hr tablet Take 1 tablet (10 mg total)  by mouth at bedtime. 90 tablet 0  . PROLENSA 0.07 % SOLN Place 1 drop into the left eye daily.   1  . risperiDONE (RISPERDAL) 0.5 MG tablet Take 1 tablet (0.5 mg total) by mouth at bedtime. 90 tablet 2  . traMADol (ULTRAM) 50 MG tablet Take 1 tablet (50 mg total) by mouth every 6 (six) hours as needed. 20 tablet 0  . traZODone (DESYREL) 50 MG tablet Take 1 tablet (50 mg total) by mouth at bedtime. 90 tablet 2   No current facility-administered medications for this visit.     Past Surgical History:  Procedure Laterality Date  . CATARACT EXTRACTION W/PHACO Right 06/30/2018   Procedure: CATARACT EXTRACTION PHACO AND INTRAOCULAR LENS PLACEMENT RIGHT EYE;  Surgeon: Tonny , MD;  Location: [facility];  Service: Ophthalmology;  Laterality: Right;  CDE: 9.90  . CATARACT  EXTRACTION W/PHACO Left 07/14/2018   Procedure: CATARACT EXTRACTION PHACO AND INTRAOCULAR LENS PLACEMENT (IOC);  Surgeon: Tonny , MD;  Location: [facility];  Service: Ophthalmology;  Laterality: Left;  CDE: 6.96  . CRANIOTOMY Left 11/19/2016   Procedure: CRANIOTOMY INTRACRANIAL FOR  ANEURYSM CLIPPING;  Surgeon: Kevan Ny Ditty, MD;  Location: [facility];  Service: Neurosurgery;  Laterality: Left;  . ELBOW ARTHROPLASTY Left    rods  . Exercise tolerance test  04/27/2013   CPET-MET: Submaximal effort (0.96, with goal of greater than 1.09) -- patient states that her effort was limited due to knee pain.  This makes the rest of the interpretation difficult: Peak VO2 - 10.5 (59% moderate), peak 02-pulse -  7..11 (low); heart rate 114 (73% -chronic incompetence considered);; PFTs normal   . MOUTH SURGERY    . NM MYOVIEW LTD  05/19/2013   LexiScan: EF 80%, no evidence of ischemia or infarction  . TRANSTHORACIC ECHOCARDIOGRAM  03/25/2013   EF 55-60%, no regional wall motion abnormalities; normal diastolic parameters, normal pulmonary pressures.  No valvular lesions noted.  Essentially normal echo      Allergies  Allergen Reactions  . Aspirin Nausea Only  . Prozac [Fluoxetine Hcl] Other (See Comments)    Headaches      Family History  Problem Relation Age of Onset  . Heart attack Father 46  . Hypertension Father   . Alcohol abuse Father   . Heart attack Maternal Grandmother   . Cancer Maternal Grandfather        type unknown  . Cancer Paternal Grandmother        type unknown  . Depression Mother   . Alcohol abuse Mother   . Ovarian cancer Paternal Aunt   . Cancer Paternal Aunt        ovarian  . Prostate cancer Paternal Uncle   . Stomach cancer Paternal Aunt   . Diabetes Daughter   . Heart disease Daughter 61       heart attack  . Depression Sister   . Alcohol abuse Brother   . Depression Sister   . Alcohol abuse Sister   . Depression Sister   . Alcohol abuse Sister   . Alcohol  abuse Brother   . Alcohol abuse Son      Social History Ms. Aldea reports that she has been smoking cigarettes. She has been smoking about 0.50 packs per day. She has never used smokeless tobacco. Ms. Kreischer reports previous alcohol use.   Review of Systems CONSTITUTIONAL: No weight loss, fever, chills, weakness or fatigue.  HEENT: Eyes: No visual loss, blurred vision, double vision or yellow  sclerae.No hearing loss, sneezing, congestion, runny nose or sore throat.  SKIN: No rash or itching.  CARDIOVASCULAR:  RESPIRATORY: No shortness of breath, cough or sputum.  GASTROINTESTINAL: No anorexia, nausea, vomiting or diarrhea. No abdominal pain or blood.  GENITOURINARY: No burning on urination, no polyuria NEUROLOGICAL: No headache, dizziness, syncope, paralysis, ataxia, numbness or tingling in the extremities. No change in bowel or bladder control.  MUSCULOSKELETAL: No muscle, back pain, joint pain or stiffness.  LYMPHATICS: No enlarged nodes. No history of splenectomy.  PSYCHIATRIC: No history of depression or anxiety.  ENDOCRINOLOGIC: No reports of sweating, cold or heat intolerance. No polyuria or polydipsia.  Marland Kitchen   Physical Examination There were no vitals filed for this visit. There were no vitals filed for this visit.  Gen: resting comfortably, no acute distress HEENT: no scleral icterus, pupils equal round and reactive, no palptable cervical adenopathy,  CV Resp: Clear to auscultation bilaterally GI: abdomen is soft, non-tender, non-distended, normal bowel sounds, no hepatosplenomegaly MSK: extremities are warm, no edema.  Skin: warm, no rash Neuro:  no focal deficits Psych: appropriate affect   Diagnostic Studies  TTE: 01/11/18  Study Conclusions  - Left ventricle: The cavity size was normal. Wall thickness was increased in a pattern of moderate LVH. Systolic function was normal. The estimated ejection fraction was in the range of 60% to 65%. Wall motion was  normal; there were no regional wall motion abnormalities. Doppler parameters are consistent with abnormal left ventricular relaxation (grade 1 diastolic dysfunction). The E/e&' ratio is <8, suggesting normal LV filling pressure. - Left atrium: The atrium was normal in size. - Right ventricle: The cavity size was mildly dilated. Mildly reduced systolic function. - Right atrium: The atrium was mildly dilated. - Tricuspid valve: There was mild regurgitation. - Pulmonary arteries: PA peak pressure: 57 mm Hg (S). - Inferior vena cava: The vessel was normal in size. The respirophasic diameter changes were in the normal range (>= 50%), consistent with normal central venous pressure.  Impressions:  - Compared to a prior study in 03/2013, the LVEF is higher at 60-65%. The RVSP is also higher at 57 mmHg.  Lexiscan: 01/13/18   There was no ST segment deviation noted during stress.  T wave inversion was noted during stress in the V1, V2, V4, V3, V5, V6, II, III and aVF leads.  Defect 1: There is a medium defect of severe severity present in the basal inferior and mid inferior location.  This is a low risk study.  Nuclear stress EF: 78%.  The left ventricular ejection fraction is hyperdynamic (>65%).  11/2019 event monitor  30 day event monitor  Min HR 52, Max HR 110, Avg HR 69  Reported symptoms correlated with sinus rhythm  No significant arrhythmias   Assessment and Plan  1. Syncope -unclear etiology, somewhat difficult historian  - awaiting 30 day event monitor - stop norvasc for now for possible orthostatic syncope       [name], M.D., F.A.C.C.

## 2020-01-11 DIAGNOSIS — F4320 Adjustment disorder, unspecified: Secondary | ICD-10-CM | POA: Diagnosis not present

## 2020-01-14 ENCOUNTER — Ambulatory Visit: Payer: Medicare HMO | Admitting: Student

## 2020-01-20 DIAGNOSIS — F4320 Adjustment disorder, unspecified: Secondary | ICD-10-CM | POA: Diagnosis not present

## 2020-01-21 ENCOUNTER — Telehealth: Payer: Self-pay | Admitting: *Deleted

## 2020-01-21 NOTE — Telephone Encounter (Signed)
-----   Message from Antoine Poche, MD sent at 01/19/2020  3:39 PM EDT ----- Normal heart monitor

## 2020-01-21 NOTE — Telephone Encounter (Signed)
Patient informed. Copy sent to PCP °

## 2020-01-25 ENCOUNTER — Ambulatory Visit (HOSPITAL_COMMUNITY): Payer: Self-pay | Admitting: Psychiatry

## 2020-01-27 ENCOUNTER — Encounter: Payer: Self-pay | Admitting: Neurology

## 2020-02-15 DIAGNOSIS — I10 Essential (primary) hypertension: Secondary | ICD-10-CM | POA: Diagnosis not present

## 2020-02-15 DIAGNOSIS — E785 Hyperlipidemia, unspecified: Secondary | ICD-10-CM | POA: Diagnosis not present

## 2020-02-19 ENCOUNTER — Encounter: Payer: Self-pay | Admitting: Student

## 2020-02-19 ENCOUNTER — Ambulatory Visit: Payer: Medicare HMO | Admitting: Student

## 2020-02-19 NOTE — Progress Notes (Deleted)
Cardiology Office Note    Date:  02/19/2020   ID:  Jennifer Jacobs, Jennifer Jacobs 09-04-1945, MRN [id number]  PCP:  Celene Squibb, MD  Cardiologist: Dorris Carnes, MD    No chief complaint on file.   History of Present Illness:    Jennifer Jacobs is a 74 y.o. female with past medical history of syncope, history of chest pain (low-risk NST in 12/2017), history of PE, and HTN who presents to the office today for 78-monthfollow-up.  She was last examined by Dr. BHarl Bowiein 11/2019 and reported having ongoing dizziness with standing for long periods at a time. Recent event monitor had shown normal sinus rhythm with no significant arrhythmias. Lopressor was reduced to 25 mg twice daily to see if this would help with her symptoms.    Past Medical History:  Diagnosis Date  . Acute blood loss anemia   . AKI (acute kidney injury) ([facility] 01/11/2018  . Alcohol abuse 07/05/2014  . Allergy   . Anxiety   . Anxiety and depression   . Arthritis   . Cognitive deficit due to recent stroke 11/22/2016  . COPD (chronic obstructive pulmonary disease) ([facility]   . Depression   . Dyslipidemia   . Fibromyalgia   . Hepatic cyst   . Hyperlipidemia   . Hypertension   . Leukocytosis   . Liver cyst 03/18/2014   01/26/2014 ultrasound Hypoechoic structure adjacent to gallbladder right lobe liver,  measuring 29 x 18 x 17 mm, possibly septated cyst    . MI (myocardial infarction) ([facility] 2007   By patient report, not substantiated by recent nuclear stress test.  . Neuromuscular disorder ([facility]   . Seizure disorder ([facility]    none since 2002  . Seizures ([facility]   . Smoker unmotivated to quit    Quit for 3 years and then restarted  . Substance abuse ([facility]    ETOH  . Vision abnormalities     Past Surgical History:  Procedure Laterality Date  . CATARACT EXTRACTION W/PHACO Right 06/30/2018   Procedure: CATARACT EXTRACTION PHACO AND INTRAOCULAR LENS PLACEMENT RIGHT EYE;  Surgeon: HTonny Branch MD;  [facility];  Service:  Ophthalmology;  Laterality: Right;  CDE: 9.90  . CATARACT EXTRACTION W/PHACO Left 07/14/2018   Procedure: CATARACT EXTRACTION PHACO AND INTRAOCULAR LENS PLACEMENT (IOC);  Surgeon: HTonny Branch MD;  [facility];  Service: Ophthalmology;  Laterality: Left;  CDE: 6.96  . CRANIOTOMY Left 11/19/2016   Procedure: CRANIOTOMY INTRACRANIAL FOR  ANEURYSM CLIPPING;  Surgeon: BKevan NyDitty, MD;  [facility]  Service: Neurosurgery;  Laterality: Left;  . ELBOW ARTHROPLASTY Left    rods  . Exercise tolerance test  04/27/2013   CPET-MET: Submaximal effort (0.96, with goal of greater than 1.09) -- patient states that her effort was limited due to knee pain.  This makes the rest of the interpretation difficult: Peak VO2 - 10.5 (59% moderate), peak 02-pulse -  7..11 (low); heart rate 114 (73% -chronic incompetence considered);; PFTs normal   . MOUTH SURGERY    . NM MYOVIEW LTD  05/19/2013   LexiScan: EF 80%, no evidence of ischemia or infarction  . TRANSTHORACIC ECHOCARDIOGRAM  03/25/2013   EF 55-60%, no regional wall motion abnormalities; normal diastolic parameters, normal pulmonary pressures.  No valvular lesions noted.  Essentially normal echo     Current Medications: Outpatient Medications Prior to Visit  Medication Sig Dispense Refill  . apixaban (ELIQUIS) 5 MG TABS tablet Take 5 mg by  mouth daily.    Marland Kitchen BESIVANCE 0.6 % SUSP Place 1 drop into the left eye 3 (three) times daily.   1  . DUREZOL 0.05 % EMUL Place 1 drop into the left eye 3 (three) times daily.   1  . escitalopram (LEXAPRO) 20 MG tablet Take 1 tablet (20 mg total) by mouth 2 (two) times daily. 60 tablet 2  . gabapentin (NEURONTIN) 800 MG tablet Take 800 mg by mouth 3 (three) times daily.     Marland Kitchen lamoTRIgine (LAMICTAL) 100 MG tablet Take 1 tablet (100 mg total) by mouth 2 (two) times daily. 180 tablet 1  . metoprolol tartrate (LOPRESSOR) 25 MG tablet Take 1 tablet (25 mg total) by mouth 2 (two) times daily. 180 tablet 3  .  olmesartan (BENICAR) 20 MG tablet Take 1 tablet (20 mg total) by mouth daily. 30 tablet 6  . oxybutynin (DITROPAN-XL) 10 MG 24 hr tablet Take 1 tablet (10 mg total) by mouth at bedtime. 90 tablet 0  . PROLENSA 0.07 % SOLN Place 1 drop into the left eye daily.   1  . risperiDONE (RISPERDAL) 0.5 MG tablet Take 1 tablet (0.5 mg total) by mouth at bedtime. 90 tablet 2  . traMADol (ULTRAM) 50 MG tablet Take 1 tablet (50 mg total) by mouth every 6 (six) hours as needed. 20 tablet 0  . traZODone (DESYREL) 50 MG tablet Take 1 tablet (50 mg total) by mouth at bedtime. 90 tablet 2   No facility-administered medications prior to visit.     Allergies:   Aspirin and Prozac [fluoxetine hcl]   Social History   Socioeconomic History  . Marital status: Legally Separated    Spouse name: Not on file  . Number of children: 4  . Years of education: 9  . Highest education level: Not on file  Occupational History  . 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 Smoking status: Current Every Day Smoker    Packs/day: 0.50    Types: Cigarettes  . Smokeless tobacco: Never Used  . Tobacco comment: one pack every 3 months  Vaping Use  . Vaping Use: Never used  Substance and Sexual Activity  . Alcohol use: Not Currently    Comment: occasionally  . Drug use: Yes    Frequency: 2.0 times per week    Types: Marijuana    Comment: last smoked 1 week ago  . Sexual activity: Not Currently    Partners: Male    Birth control/protection: None  Other Topics Concern  . Not on file  Social History Narrative   Lives alone with dog   Works as a Microbiologist    Does drink about 5 alcoholic beverages a week.            Husband: Coralyn Mark - divorced   Daughters: Gilmore Laroche (passed away) , Engineering geologist   Social Determinants of Health   Financial Resource Strain:   . Difficulty of Paying Living Expenses:   Food Insecurity:   . Worried About Charity fundraiser in the Last Year:   .  Arboriculturist in the Last Year:   Transportation Needs:   . Film/video editor (Medical):   Marland Kitchen Lack of Transportation (Non-Medical):   Physical Activity:   . Days of Exercise per Week:   . Minutes of Exercise per Session:   Stress:   . Feeling of Stress :   Social Connections:   .  Frequency of Communication with Friends and Family:   . Frequency of Social Gatherings with Friends and Family:   . Attends Religious Services:   . Active Member of Clubs or Organizations:   . Attends Archivist Meetings:   Marland Kitchen Marital Status:      Family History:  The patient's ***family history includes Alcohol abuse in her brother, brother, father, mother, sister, sister, and son; Cancer in her maternal grandfather, paternal aunt, and paternal grandmother; Depression in her mother, sister, sister, and sister; Diabetes in her daughter; Heart attack in her maternal grandmother; Heart attack (age of onset: 44) in her father; Heart disease (age of onset: 77) in her daughter; Hypertension in her father; Ovarian cancer in her paternal aunt; Prostate cancer in her paternal uncle; Stomach cancer in her paternal aunt.   Review of Systems:   Please see the history of present illness.     General:  No chills, fever, night sweats or weight changes.  Cardiovascular:  No chest pain, dyspnea on exertion, edema, orthopnea, palpitations, paroxysmal nocturnal dyspnea. Dermatological: No rash, lesions/masses Respiratory: No cough, dyspnea Urologic: No hematuria, dysuria Abdominal:   No nausea, vomiting, diarrhea, bright red blood per rectum, melena, or hematemesis Neurologic:  No visual changes, wkns, changes in mental status. All other systems reviewed and are otherwise negative except as noted above.   Physical Exam:    VS:  There were no vitals taken for this visit.   General: Well developed, well nourished,female appearing in no acute distress. Head: Normocephalic, atraumatic, sclera non-icteric.    Neck: No carotid bruits. JVD not elevated.  Lungs: Respirations regular and unlabored, without wheezes or rales.  Heart: ***Regular rate and rhythm. No S3 or S4.  No murmur, no rubs, or gallops appreciated. Abdomen: Soft, non-tender, non-distended. No obvious abdominal masses. Msk:  Strength and tone appear normal for age. No obvious joint deformities or effusions. Extremities: No clubbing or cyanosis. No edema.  Distal pedal pulses are 2+ bilaterally. Neuro: Alert and oriented X 3. Moves all extremities spontaneously. No focal deficits noted. Psych:  Responds to questions appropriately with a normal affect. Skin: No rashes or lesions noted  Wt Readings from Last 3 Encounters:  12/18/19 195 lb (88.5 kg)  12/09/19 210 lb (95.3 kg)  11/05/19 202 lb (91.6 kg)        Studies/Labs Reviewed:   EKG:  EKG is*** ordered today.  The ekg ordered today demonstrates ***  Recent Labs: 06/08/2019: BUN 13; Creatinine, Ser 1.26; Hemoglobin 13.0; Platelets 272; Potassium 3.9; Sodium 140   Lipid Panel No results found for: CHOL, TRIG, HDL, CHOLHDL, VLDL, LDLCALC, LDLDIRECT  Additional studies/ records that were reviewed today include:   Echocardiogram: 12/2017 Study Conclusions   - Left ventricle: The cavity size was normal. Wall thickness was  increased in a pattern of moderate LVH. Systolic function was  normal. The estimated ejection fraction was in the range of 60%  to 65%. Wall motion was normal; there were no regional wall  motion abnormalities. Doppler parameters are consistent with  abnormal left ventricular relaxation (grade 1 diastolic  dysfunction). The E/e&' ratio is <8, suggesting normal LV filling  pressure.  - Left atrium: The atrium was normal in size.  - Right ventricle: The cavity size was mildly dilated. Mildly  reduced systolic function.  - Right atrium: The atrium was mildly dilated.  - Tricuspid valve: There was mild regurgitation.  - Pulmonary  arteries: PA peak pressure: 57 mm Hg (S).  - Inferior  vena cava: The vessel was normal in size. The  respirophasic diameter changes were in the normal range (>= 50%),  consistent with normal central venous pressure.   Impressions:   - Compared to a prior study in 03/2013, the LVEF is higher at  60-65%. The RVSP is also higher at 57 mmHg.    NST: 12/2017  There was no ST segment deviation noted during stress.  T wave inversion was noted during stress in the V1, V2, V4, V3, V5, V6, II, III and aVF leads.  Defect 1: There is a medium defect of severe severity present in the basal inferior and mid inferior location.  This is a low risk study.  Nuclear stress EF: 78%.  The left ventricular ejection fraction is hyperdynamic (>65%).   Abnormal, low risk stress nuclear study with significant soft tissue attenuation (inferior wall) vs prior infarct; no significant ischemia; EF 78 with normal wall motion.   Event Monitor: 11/2019  30 day event monitor  Min HR 52, Max HR 110, Avg HR 69  Reported symptoms correlated with sinus rhythm  No significant arrhythmias   Assessment:    No diagnosis found.   Plan:   In order of problems listed above:  1. ***    Medication Adjustments/Labs and Tests Ordered: Current medicines are reviewed at length with the patient today.  Concerns regarding medicines are outlined above.  Medication changes, Labs and Tests ordered today are listed in the Patient Instructions below. There are no Patient Instructions on file for this visit.   Signed, Erma Heritage, PA-C  02/19/2020 10:41 AM    [facility] [address] [place], [place] [address] Phone: [phone] Fax: [phone]

## 2020-03-18 DIAGNOSIS — I10 Essential (primary) hypertension: Secondary | ICD-10-CM | POA: Diagnosis not present

## 2020-03-18 DIAGNOSIS — E785 Hyperlipidemia, unspecified: Secondary | ICD-10-CM | POA: Diagnosis not present

## 2020-03-29 DIAGNOSIS — I739 Peripheral vascular disease, unspecified: Secondary | ICD-10-CM | POA: Diagnosis not present

## 2020-03-29 DIAGNOSIS — S82831D Other fracture of upper and lower end of right fibula, subsequent encounter for closed fracture with routine healing: Secondary | ICD-10-CM | POA: Diagnosis not present

## 2020-03-29 DIAGNOSIS — I714 Abdominal aortic aneurysm, without rupture: Secondary | ICD-10-CM | POA: Diagnosis not present

## 2020-03-29 DIAGNOSIS — I69354 Hemiplegia and hemiparesis following cerebral infarction affecting left non-dominant side: Secondary | ICD-10-CM | POA: Diagnosis not present

## 2020-03-29 DIAGNOSIS — N3001 Acute cystitis with hematuria: Secondary | ICD-10-CM | POA: Diagnosis not present

## 2020-03-29 DIAGNOSIS — I69315 Cognitive social or emotional deficit following cerebral infarction: Secondary | ICD-10-CM | POA: Diagnosis not present

## 2020-03-29 DIAGNOSIS — S92324D Nondisplaced fracture of second metatarsal bone, right foot, subsequent encounter for fracture with routine healing: Secondary | ICD-10-CM | POA: Diagnosis not present

## 2020-03-29 DIAGNOSIS — D649 Anemia, unspecified: Secondary | ICD-10-CM | POA: Diagnosis not present

## 2020-03-29 DIAGNOSIS — N182 Chronic kidney disease, stage 2 (mild): Secondary | ICD-10-CM | POA: Diagnosis not present

## 2020-03-29 DIAGNOSIS — I119 Hypertensive heart disease without heart failure: Secondary | ICD-10-CM | POA: Diagnosis not present

## 2020-04-18 IMAGING — CT CT ANGIO CHEST
2 of 6 series · 18 of 36 positions shown · IV contrast (Omni 300)
Comparison: CT abdomen and pelvis 03/22/2015

CLINICAL DATA: Short of breath.  Positive D-dimer.

EXAM:
CT ANGIOGRAPHY CHEST WITH CONTRAST
TECHNIQUE: Multidetector CT imaging of the chest was performed using the
standard protocol during bolus administration of intravenous
contrast. Multiplanar CT image reconstructions and MIPs were
obtained to evaluate the vascular anatomy.
CONTRAST:  100mL AX6YIW-RW7 IOPAMIDOL (AX6YIW-RW7) INJECTION 76%

[Series 7: pe thins · axial · 0.74mm/px · z∈[+970,+1198]mm · 17 of 258 slices shown]
[im 15/258  lung]
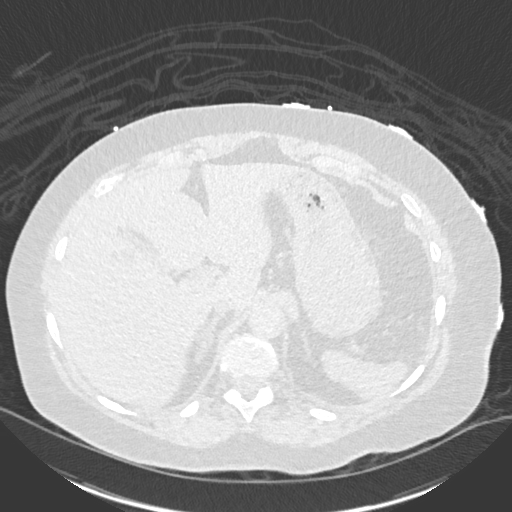
[im 29/258  mediastinal]
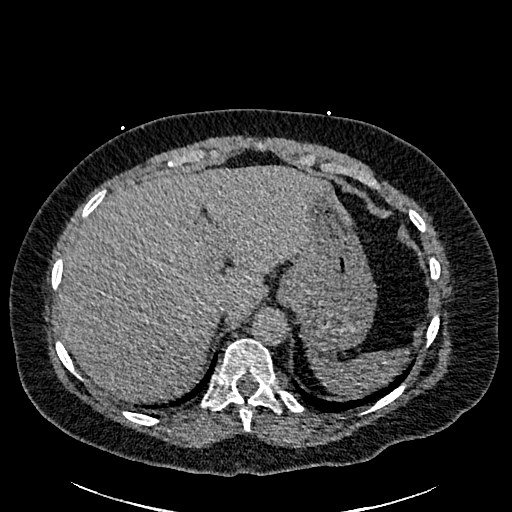
[im 43/258  lung]
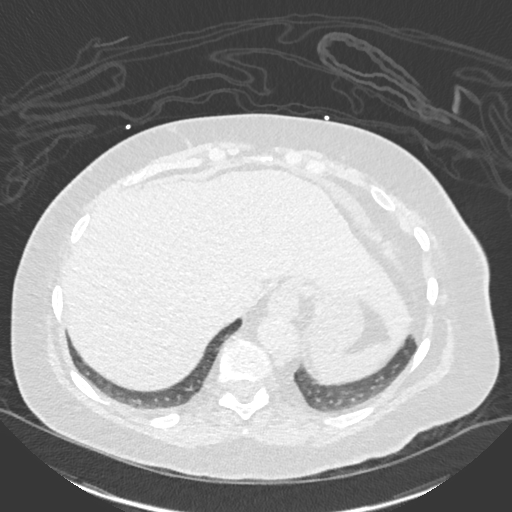
[im 58/258  mediastinal]
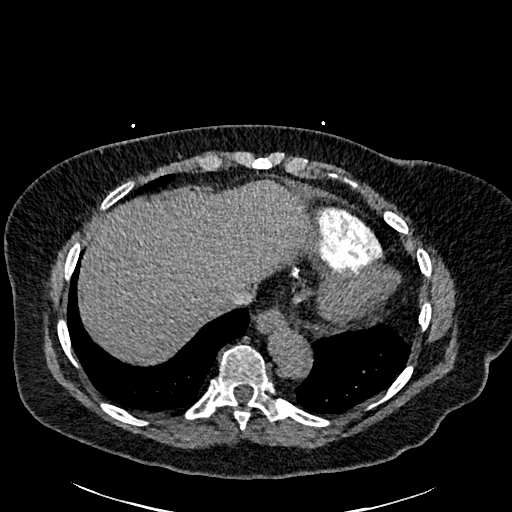
[im 72/258  lung]
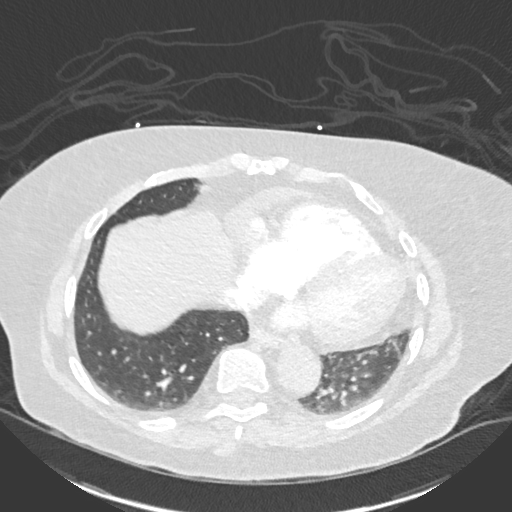
[im 86/258  mediastinal]
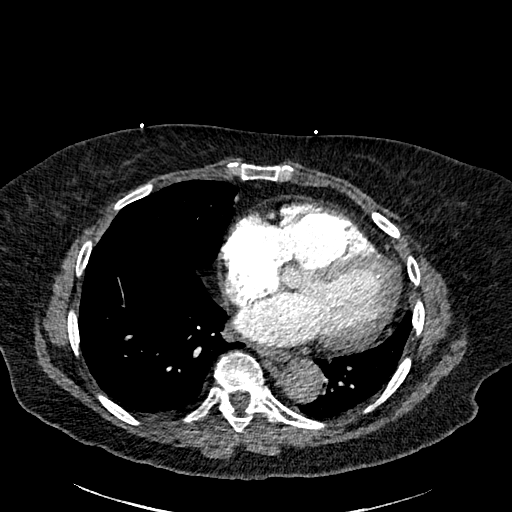
[im 100/258  lung]
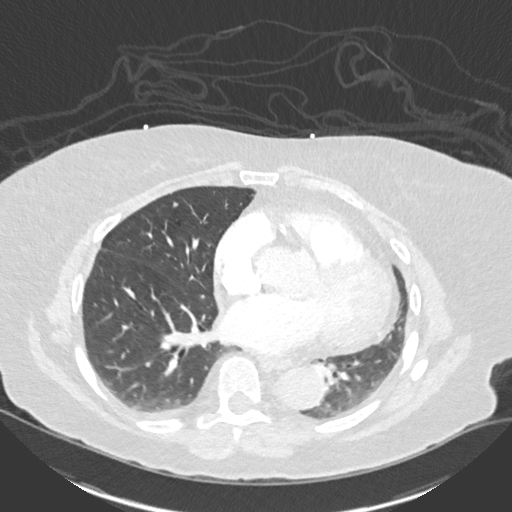
[im 115/258  mediastinal]
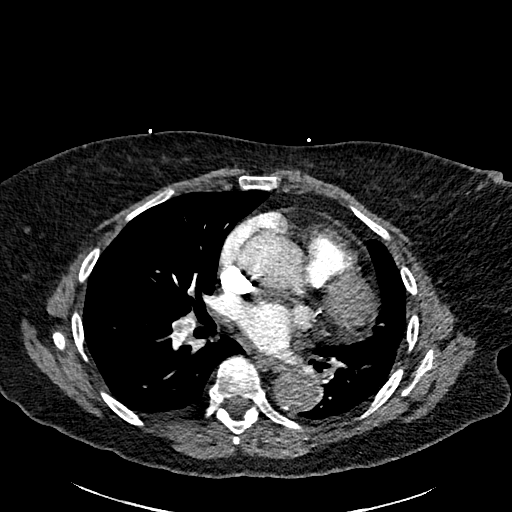
[im 129/258  lung]
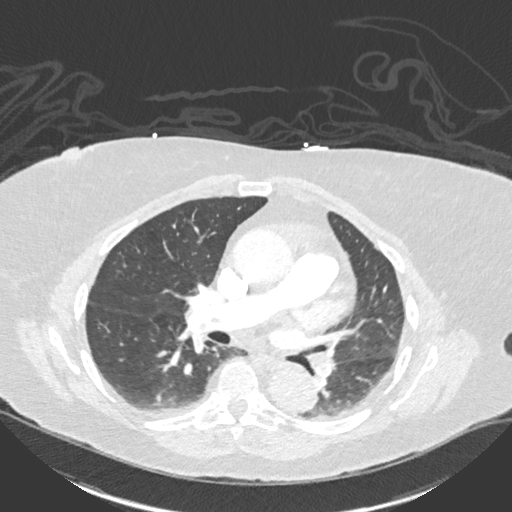
[im 143/258  mediastinal]
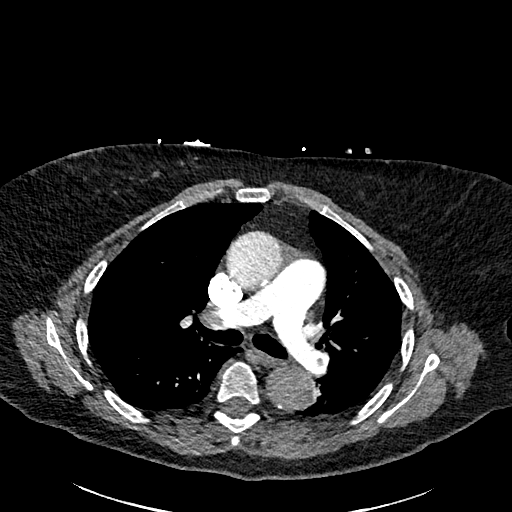
[im 158/258  lung]
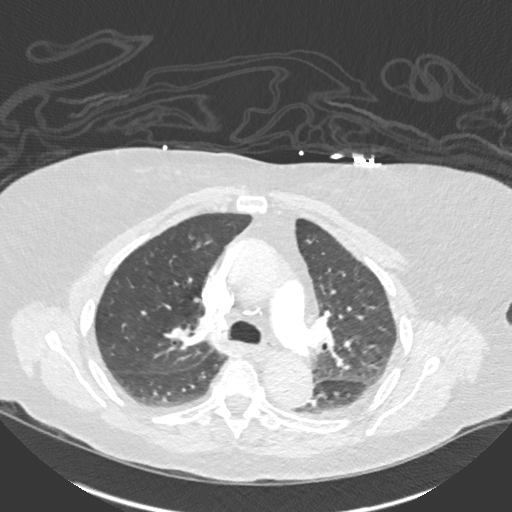
[im 172/258  mediastinal]
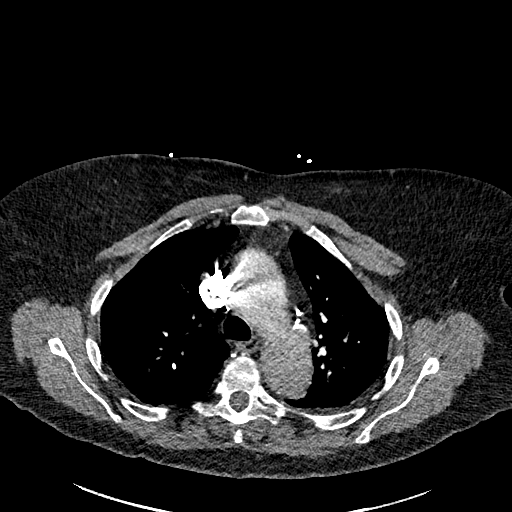
[im 186/258  lung]
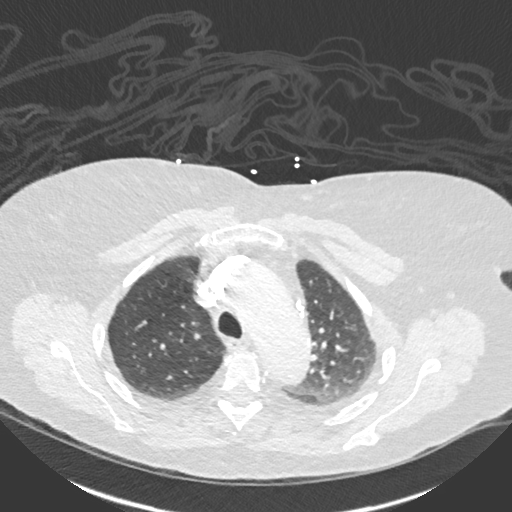
[im 200/258  mediastinal]
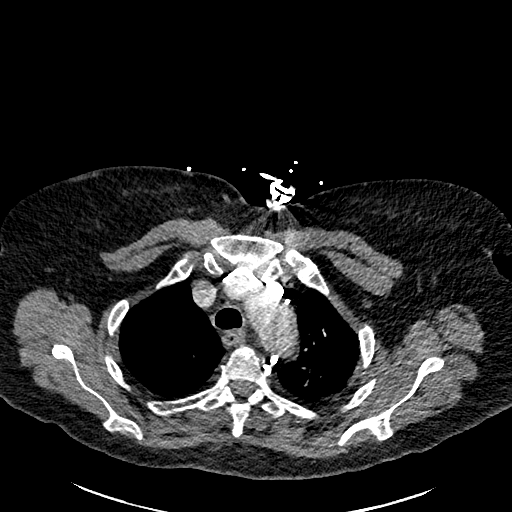
[im 215/258  lung]
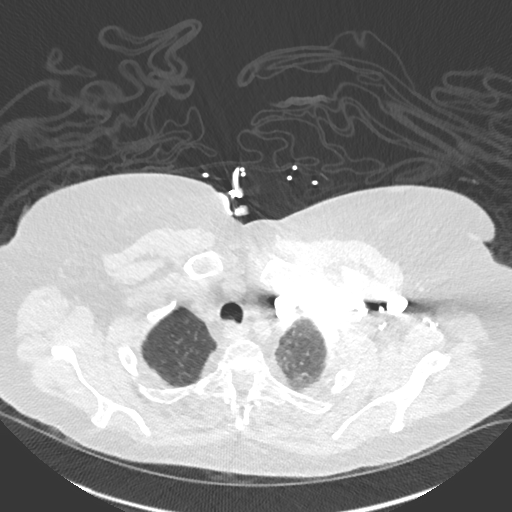
[im 229/258  mediastinal]
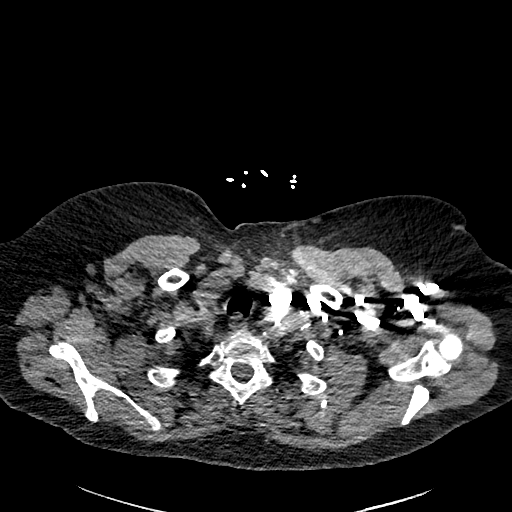
[im 243/258  lung]
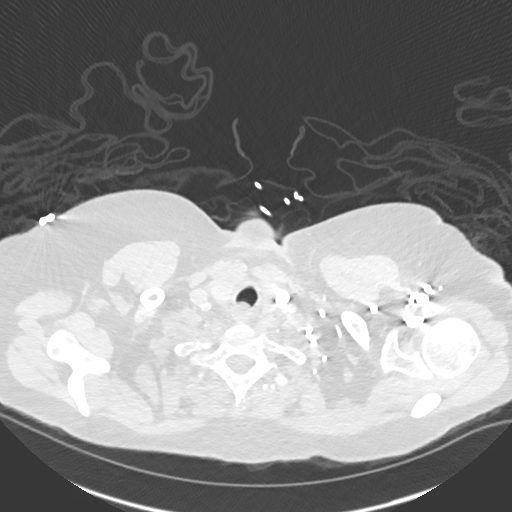

[Series 8: pe 2mm cor · coronal · 0.52mm/px · 1 of 145 slices shown]
[im 73/145  mediastinal]
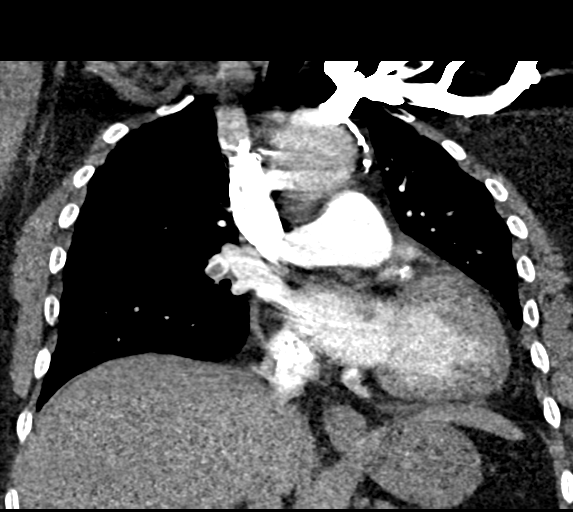

[18 of 36 positions shown; findings below may reference images not displayed]

FINDINGS: Cardiovascular: Positive central right and left pulmonary artery
thromboembolism is present. There are low-density serpiginous
filling defects in the main right pulmonary artery extending into
lobar and segmental vessels. There are similar low-density
serpiginous filling defects in the left pulmonary artery extending
into branches of the lingula and left lower lobe. The heart is
enlarged. Right ventricle to left ventricle ratio is 1.2 compatible
with mild elevated right heart pressure in comparison to the prior
CT, the RV to LV ratio is stable. Aortic arch atherosclerotic
calcification. No obvious evidence of dissection. Maximal diameter
of the ascending aorta is 4.0 cm. Mild coronary artery
calcification.

Mediastinum/Nodes: No abnormal mediastinal adenopathy. Physiologic
pericardial fluid. Thyroid is unremarkable.

Lungs/Pleura: No pneumothorax. No pleural effusion. Dependent
atelectasis and low lung volumes.

Upper Abdomen: Small cyst adjacent to the gallbladder is stable.

Musculoskeletal: Degenerative disc disease at C6-7 is partially
imaged. No vertebral compression deformity.

Review of the MIP images confirms the above findings.
IMPRESSION: The study is positive for bilateral central pulmonary
thromboembolism. There is mild right heart strain based on the RV to
LV ratio, however based on the prior CT, this is stable and
therefore a chronic finding. Critical Value/emergent results were
called by telephone at the time of interpretation on 01/13/2018 at
[DATE] to [REDACTED], who verbally acknowledged these
results.

Aortic Atherosclerosis ([id number].A).

## 2020-04-29 ENCOUNTER — Encounter: Payer: Self-pay | Admitting: Neurology

## 2020-05-02 ENCOUNTER — Ambulatory Visit: Payer: Medicare HMO | Admitting: Neurology

## 2020-05-02 ENCOUNTER — Other Ambulatory Visit: Payer: Self-pay

## 2020-05-02 DIAGNOSIS — R202 Paresthesia of skin: Secondary | ICD-10-CM

## 2020-08-05 ENCOUNTER — Ambulatory Visit: Payer: Medicare HMO | Admitting: Neurology

## 2020-09-17 DIAGNOSIS — F3181 Bipolar II disorder: Secondary | ICD-10-CM | POA: Diagnosis not present

## 2020-09-17 DIAGNOSIS — I2699 Other pulmonary embolism without acute cor pulmonale: Secondary | ICD-10-CM | POA: Diagnosis not present

## 2020-09-17 DIAGNOSIS — Z86711 Personal history of pulmonary embolism: Secondary | ICD-10-CM | POA: Diagnosis not present

## 2020-09-17 DIAGNOSIS — E782 Mixed hyperlipidemia: Secondary | ICD-10-CM | POA: Diagnosis not present

## 2020-09-17 DIAGNOSIS — I10 Essential (primary) hypertension: Secondary | ICD-10-CM | POA: Diagnosis not present

## 2020-09-17 DIAGNOSIS — G4089 Other seizures: Secondary | ICD-10-CM | POA: Diagnosis not present

## 2020-09-17 DIAGNOSIS — I639 Cerebral infarction, unspecified: Secondary | ICD-10-CM | POA: Diagnosis not present

## 2020-09-17 DIAGNOSIS — Z8673 Personal history of transient ischemic attack (TIA), and cerebral infarction without residual deficits: Secondary | ICD-10-CM | POA: Diagnosis not present

## 2020-09-19 DIAGNOSIS — R110 Nausea: Secondary | ICD-10-CM | POA: Diagnosis not present

## 2020-09-19 DIAGNOSIS — R059 Cough, unspecified: Secondary | ICD-10-CM | POA: Diagnosis not present

## 2020-09-19 DIAGNOSIS — R5382 Chronic fatigue, unspecified: Secondary | ICD-10-CM | POA: Diagnosis not present

## 2020-09-19 DIAGNOSIS — R6883 Chills (without fever): Secondary | ICD-10-CM | POA: Diagnosis not present

## 2020-09-19 DIAGNOSIS — R0602 Shortness of breath: Secondary | ICD-10-CM | POA: Diagnosis not present

## 2020-09-21 ENCOUNTER — Encounter: Payer: Self-pay | Admitting: Neurology

## 2020-10-17 DIAGNOSIS — I10 Essential (primary) hypertension: Secondary | ICD-10-CM | POA: Diagnosis not present

## 2020-10-17 DIAGNOSIS — E782 Mixed hyperlipidemia: Secondary | ICD-10-CM | POA: Diagnosis not present

## 2020-10-17 DIAGNOSIS — I639 Cerebral infarction, unspecified: Secondary | ICD-10-CM | POA: Diagnosis not present

## 2020-10-17 DIAGNOSIS — Z8673 Personal history of transient ischemic attack (TIA), and cerebral infarction without residual deficits: Secondary | ICD-10-CM | POA: Diagnosis not present

## 2020-10-17 DIAGNOSIS — G4089 Other seizures: Secondary | ICD-10-CM | POA: Diagnosis not present

## 2020-10-17 DIAGNOSIS — F3181 Bipolar II disorder: Secondary | ICD-10-CM | POA: Diagnosis not present

## 2020-10-17 DIAGNOSIS — I2699 Other pulmonary embolism without acute cor pulmonale: Secondary | ICD-10-CM | POA: Diagnosis not present

## 2020-10-31 ENCOUNTER — Ambulatory Visit: Payer: Medicare HMO | Admitting: Neurology

## 2020-11-10 ENCOUNTER — Encounter: Payer: Self-pay | Admitting: Neurology

## 2020-11-10 ENCOUNTER — Ambulatory Visit: Payer: Medicare HMO | Admitting: Neurology

## 2020-11-16 DIAGNOSIS — I10 Essential (primary) hypertension: Secondary | ICD-10-CM | POA: Diagnosis not present

## 2020-11-16 DIAGNOSIS — I639 Cerebral infarction, unspecified: Secondary | ICD-10-CM | POA: Diagnosis not present

## 2020-11-16 DIAGNOSIS — G4089 Other seizures: Secondary | ICD-10-CM | POA: Diagnosis not present

## 2020-11-16 DIAGNOSIS — F3181 Bipolar II disorder: Secondary | ICD-10-CM | POA: Diagnosis not present

## 2020-11-16 DIAGNOSIS — E782 Mixed hyperlipidemia: Secondary | ICD-10-CM | POA: Diagnosis not present

## 2020-11-16 DIAGNOSIS — I2699 Other pulmonary embolism without acute cor pulmonale: Secondary | ICD-10-CM | POA: Diagnosis not present

## 2020-12-01 DIAGNOSIS — M545 Low back pain, unspecified: Secondary | ICD-10-CM | POA: Diagnosis not present

## 2020-12-01 DIAGNOSIS — M5432 Sciatica, left side: Secondary | ICD-10-CM | POA: Diagnosis not present

## 2020-12-01 DIAGNOSIS — M25511 Pain in right shoulder: Secondary | ICD-10-CM | POA: Diagnosis not present

## 2020-12-18 DIAGNOSIS — I10 Essential (primary) hypertension: Secondary | ICD-10-CM | POA: Diagnosis not present

## 2020-12-18 DIAGNOSIS — D649 Anemia, unspecified: Secondary | ICD-10-CM | POA: Diagnosis not present

## 2021-01-01 DIAGNOSIS — F3181 Bipolar II disorder: Secondary | ICD-10-CM | POA: Insufficient documentation

## 2021-01-01 DIAGNOSIS — I119 Hypertensive heart disease without heart failure: Secondary | ICD-10-CM | POA: Insufficient documentation

## 2021-01-01 DIAGNOSIS — I639 Cerebral infarction, unspecified: Secondary | ICD-10-CM | POA: Insufficient documentation

## 2021-01-01 DIAGNOSIS — N189 Chronic kidney disease, unspecified: Secondary | ICD-10-CM | POA: Insufficient documentation

## 2021-01-01 DIAGNOSIS — I69319 Unspecified symptoms and signs involving cognitive functions following cerebral infarction: Secondary | ICD-10-CM | POA: Insufficient documentation

## 2021-01-01 DIAGNOSIS — G459 Transient cerebral ischemic attack, unspecified: Secondary | ICD-10-CM | POA: Insufficient documentation

## 2021-01-01 DIAGNOSIS — I739 Peripheral vascular disease, unspecified: Secondary | ICD-10-CM | POA: Insufficient documentation

## 2021-01-01 DIAGNOSIS — E785 Hyperlipidemia, unspecified: Secondary | ICD-10-CM | POA: Insufficient documentation

## 2021-01-01 DIAGNOSIS — I10 Essential (primary) hypertension: Secondary | ICD-10-CM | POA: Insufficient documentation

## 2021-01-03 ENCOUNTER — Encounter (HOSPITAL_COMMUNITY): Payer: Self-pay | Admitting: Emergency Medicine

## 2021-01-03 ENCOUNTER — Emergency Department (HOSPITAL_COMMUNITY): Payer: Medicare HMO

## 2021-01-03 ENCOUNTER — Emergency Department (HOSPITAL_COMMUNITY)
Admission: EM | Admit: 2021-01-03 | Discharge: 2021-01-03 | Disposition: A | Discharge status: Home / Self Care | Payer: Medicare HMO | Attending: Emergency Medicine | Admitting: Emergency Medicine

## 2021-01-03 ENCOUNTER — Other Ambulatory Visit: Payer: Self-pay

## 2021-01-03 DIAGNOSIS — R42 Dizziness and giddiness: Secondary | ICD-10-CM | POA: Diagnosis not present

## 2021-01-03 DIAGNOSIS — Z5321 Procedure and treatment not carried out due to patient leaving prior to being seen by health care provider: Secondary | ICD-10-CM | POA: Insufficient documentation

## 2021-01-03 DIAGNOSIS — W19XXXA Unspecified fall, initial encounter: Secondary | ICD-10-CM | POA: Diagnosis not present

## 2021-01-03 DIAGNOSIS — M25512 Pain in left shoulder: Secondary | ICD-10-CM | POA: Diagnosis not present

## 2021-01-03 DIAGNOSIS — Z043 Encounter for examination and observation following other accident: Secondary | ICD-10-CM | POA: Diagnosis not present

## 2021-01-03 DIAGNOSIS — M25562 Pain in left knee: Secondary | ICD-10-CM | POA: Diagnosis not present

## 2021-01-03 DIAGNOSIS — M19012 Primary osteoarthritis, left shoulder: Secondary | ICD-10-CM | POA: Diagnosis not present

## 2021-01-03 NOTE — ED Triage Notes (Addendum)
Pt c/o falling x 2 today. Pt states she was standing and got dizzy both times that she fell. pt states that she has been dizzy x 1 year that causes her to fall. Pt c/o left shoulder and left knee pain.

## 2021-01-09 ENCOUNTER — Ambulatory Visit (INDEPENDENT_AMBULATORY_CARE_PROVIDER_SITE_OTHER): Payer: Medicare HMO | Admitting: Orthopedic Surgery

## 2021-01-09 ENCOUNTER — Other Ambulatory Visit: Payer: Self-pay

## 2021-01-09 ENCOUNTER — Ambulatory Visit: Payer: Medicare HMO

## 2021-01-09 ENCOUNTER — Encounter: Payer: Self-pay | Admitting: Orthopedic Surgery

## 2021-01-09 VITALS — BP 149/109 | HR 79 | Ht 68.5 in | Wt 198.0 lb

## 2021-01-09 DIAGNOSIS — M545 Low back pain, unspecified: Secondary | ICD-10-CM

## 2021-01-09 DIAGNOSIS — M25512 Pain in left shoulder: Secondary | ICD-10-CM

## 2021-01-09 DIAGNOSIS — G8929 Other chronic pain: Secondary | ICD-10-CM | POA: Diagnosis not present

## 2021-01-09 DIAGNOSIS — M25511 Pain in right shoulder: Secondary | ICD-10-CM

## 2021-01-09 NOTE — Patient Instructions (Addendum)
Recommend:  Physical therapy lumbar spine  Physical therapy shoulders  See primary care doctor for referral to neurosurgery if patient does not improve after therapy  Extra strength tylenol for pain 500 mg every 6 hrs

## 2021-01-09 NOTE — Progress Notes (Addendum)
Chief Complaint  Patient presents with  . Back Pain    Back and legs painful for years, no previous surgery    75 year old female comes in with bilateral lower back pain radiating left leg pain down to her foot associated with some knee pain as well.  Also complains of bilateral periscapular pain and lower neck pain  Review of systems abdominal pain intermittently constipation chest pain shortness of breath depression no suicidal thoughts joint pain hand tremor leg weakness dizziness frequent falls  Past Medical History:  Diagnosis Date  . Acute blood loss anemia   . AKI (acute kidney injury) (HCC) 01/11/2018  . Alcohol abuse 07/05/2014  . Allergy   . Anxiety   . Anxiety and depression   . Arthritis   . Cognitive deficit due to recent stroke 11/22/2016  . COPD (chronic obstructive pulmonary disease) (HCC)   . Depression   . Dyslipidemia   . Fibromyalgia   . Hepatic cyst   . Hyperlipidemia   . Hypertension   . Leukocytosis   . Liver cyst 03/18/2014   01/26/2014 ultrasound Hypoechoic structure adjacent to gallbladder right lobe liver,  measuring 29 x 18 x 17 mm, possibly septated cyst    . MI (myocardial infarction) (HCC) 2007   By patient report, not substantiated by recent nuclear stress test.  . Neuromuscular disorder (HCC)   . Seizure disorder (HCC)    none since 2002  . Seizures (HCC)   . Smoker unmotivated to quit    Quit for 3 years and then restarted  . Substance abuse (HCC)    ETOH  . Vision abnormalities      Current Outpatient Medications:  .  albuterol (VENTOLIN HFA) 108 (90 Base) MCG/ACT inhaler, Inhale into the lungs every 6 (six) hours as needed for wheezing or shortness of breath., Disp: , Rfl:  .  apixaban (ELIQUIS) 5 MG TABS tablet, Take 5 mg by mouth daily., Disp: , Rfl:  .  BESIVANCE 0.6 % SUSP, Place 1 drop into the left eye 3 (three) times daily. , Disp: , Rfl: 1 .  DUREZOL 0.05 % EMUL, Place 1 drop into the left eye 3 (three) times daily. , Disp: ,  Rfl: 1 .  escitalopram (LEXAPRO) 20 MG tablet, Take 1 tablet (20 mg total) by mouth 2 (two) times daily., Disp: 60 tablet, Rfl: 2 .  gabapentin (NEURONTIN) 800 MG tablet, Take 800 mg by mouth 3 (three) times daily. , Disp: , Rfl:  .  olmesartan (BENICAR) 20 MG tablet, Take 1 tablet (20 mg total) by mouth daily., Disp: 30 tablet, Rfl: 6 .  oxybutynin (DITROPAN-XL) 10 MG 24 hr tablet, Take 1 tablet (10 mg total) by mouth at bedtime., Disp: 90 tablet, Rfl: 0 .  PROLENSA 0.07 % SOLN, Place 1 drop into the left eye daily. , Disp: , Rfl: 1 .  risperiDONE (RISPERDAL) 0.5 MG tablet, Take 1 tablet (0.5 mg total) by mouth at bedtime., Disp: 90 tablet, Rfl: 2 .  traMADol (ULTRAM) 50 MG tablet, Take by mouth every 6 (six) hours as needed., Disp: , Rfl:  .  traZODone (DESYREL) 50 MG tablet, Take 1 tablet (50 mg total) by mouth at bedtime., Disp: 90 tablet, Rfl: 2 .  lamoTRIgine (LAMICTAL) 100 MG tablet, Take 1 tablet (100 mg total) by mouth 2 (two) times daily., Disp: 180 tablet, Rfl: 1 .  metoprolol tartrate (LOPRESSOR) 25 MG tablet, Take 1 tablet (25 mg total) by mouth 2 (two) times daily., Disp: 180 tablet,  Rfl: 3   BP (!) 149/109   Pulse 79   Ht 5' 8.5" (1.74 m)   Wt 198 lb (89.8 kg)   BMI 29.67 kg/m   She is awake and alert somewhat confused at times hard to focus  Normal body habitus  Normal grooming  Pain periscapular region both shoulders up into the lower cervical spine and trapezius muscle bilaterally  Forward elevation both shoulders 130 degrees  Lower back tenderness left and right side  Normal left knee exam no effusion full range of motion  Mild global weakness left lower extremity no focal weakness  No neurovascular deficits  Imaging lumbar spine  Facet arthritis L4 and L5 spondylolisthesis grade 1 spondylosis L4-S1   Encounter Diagnoses  Name Primary?  . Lumbar pain Yes  . Chronic pain of both shoulders     IMPRESSION: L4-5 facet joint degenerative changes with 3  mm anterior slip of L4 and very mild L4-5 disc space narrowing new since prior exam.   Schmorl's node deformity inferior endplate L5. Superimposed degenerative changes have progressed slightly since prior exam.   Abdominal aortic aneurysm L4 level with AP dimension of 3.1 cm versus prior 2.4 cm. Plain film magnification may accentuate this finding. Ultrasound could be obtained further delineation.   Mild sacroiliac joint degenerative changes greater on the left.   Mild hip joint degenerative changes greater on the right.     Electronically Signed   By: Lacy Duverney M.D.   On: 03/22/2015 10:21     Recommend:  Physical therapy lumbar spine  Physical therapy shoulders  See primary care doctor for referral to neurosurgery if patient does not improve after therapy

## 2021-01-09 NOTE — Addendum Note (Signed)
Addended byCaffie Damme on: 01/09/2021 11:32 AM   Modules accepted: Orders

## 2021-01-17 DIAGNOSIS — D649 Anemia, unspecified: Secondary | ICD-10-CM | POA: Diagnosis not present

## 2021-01-17 DIAGNOSIS — I10 Essential (primary) hypertension: Secondary | ICD-10-CM | POA: Diagnosis not present

## 2021-01-20 DIAGNOSIS — I10 Essential (primary) hypertension: Secondary | ICD-10-CM | POA: Diagnosis not present

## 2021-01-20 DIAGNOSIS — D649 Anemia, unspecified: Secondary | ICD-10-CM | POA: Diagnosis not present

## 2021-02-01 ENCOUNTER — Ambulatory Visit (HOSPITAL_COMMUNITY): Payer: Medicare HMO | Attending: Orthopedic Surgery | Admitting: Physical Therapy

## 2021-02-01 ENCOUNTER — Telehealth (HOSPITAL_COMMUNITY): Payer: Self-pay

## 2021-02-01 DIAGNOSIS — G8929 Other chronic pain: Secondary | ICD-10-CM | POA: Insufficient documentation

## 2021-02-01 DIAGNOSIS — M545 Low back pain, unspecified: Secondary | ICD-10-CM | POA: Insufficient documentation

## 2021-02-01 DIAGNOSIS — M25511 Pain in right shoulder: Secondary | ICD-10-CM | POA: Insufficient documentation

## 2021-02-01 DIAGNOSIS — M25512 Pain in left shoulder: Secondary | ICD-10-CM | POA: Insufficient documentation

## 2021-02-01 DIAGNOSIS — R29898 Other symptoms and signs involving the musculoskeletal system: Secondary | ICD-10-CM | POA: Insufficient documentation

## 2021-02-01 DIAGNOSIS — M6281 Muscle weakness (generalized): Secondary | ICD-10-CM | POA: Insufficient documentation

## 2021-02-01 DIAGNOSIS — R262 Difficulty in walking, not elsewhere classified: Secondary | ICD-10-CM | POA: Insufficient documentation

## 2021-02-01 NOTE — Telephone Encounter (Signed)
S/w Daughter to ask her to remind her mother that she has two apptments PT/OT [facility] on Friday 02/03/21.

## 2021-02-03 ENCOUNTER — Other Ambulatory Visit: Payer: Self-pay

## 2021-02-03 ENCOUNTER — Encounter (HOSPITAL_COMMUNITY): Payer: Self-pay

## 2021-02-03 ENCOUNTER — Ambulatory Visit (HOSPITAL_COMMUNITY): Payer: Medicare HMO

## 2021-02-03 ENCOUNTER — Ambulatory Visit (HOSPITAL_COMMUNITY): Payer: Medicare HMO | Admitting: Occupational Therapy

## 2021-02-03 DIAGNOSIS — G8929 Other chronic pain: Secondary | ICD-10-CM

## 2021-02-03 DIAGNOSIS — R29898 Other symptoms and signs involving the musculoskeletal system: Secondary | ICD-10-CM | POA: Diagnosis not present

## 2021-02-03 DIAGNOSIS — J449 Chronic obstructive pulmonary disease, unspecified: Secondary | ICD-10-CM | POA: Diagnosis not present

## 2021-02-03 DIAGNOSIS — M25512 Pain in left shoulder: Secondary | ICD-10-CM | POA: Diagnosis not present

## 2021-02-03 DIAGNOSIS — M6281 Muscle weakness (generalized): Secondary | ICD-10-CM

## 2021-02-03 DIAGNOSIS — M545 Low back pain, unspecified: Secondary | ICD-10-CM

## 2021-02-03 DIAGNOSIS — R262 Difficulty in walking, not elsewhere classified: Secondary | ICD-10-CM

## 2021-02-03 DIAGNOSIS — M25511 Pain in right shoulder: Secondary | ICD-10-CM | POA: Diagnosis not present

## 2021-02-03 NOTE — Patient Instructions (Signed)
Scapular Exercises  1) Seated Row   Sit up straight with elbows by your sides. Pull back with shoulders/elbows, keeping forearms straight, as if pulling back on the reins of a horse. Squeeze shoulder blades together. Repeat _10-15__times, __2-3__sets/day    2) Shoulder Elevation    Sit up straight with arms by your sides. Slowly bring your shoulders up towards your ears. Repeat_10-15__times, __2-3__ sets/day    3) Shoulder Extension    Sit up straight with both arms by your side, draw your arms back behind your waist. Keep your elbows straight. Repeat __10-15__times, __2-3__sets/day.    Shoulder Exercises: Perform each exercise ____10-15____ reps. 2-3x days.   1) Protraction   Start by holding a wand or cane at chest height.  Next, slowly push the wand outwards in front of your body so that your elbows become fully straightened. Then, return to the original position.     2) Shoulder FLEXION   In the standing position, hold a wand/cane with both arms, palms down on both sides. Raise up the wand/cane allowing your unaffected arm to perform most of the effort. Your affected arm should be partially relaxed.      3) Internal/External ROTATION   In the standing position, hold a wand/cane with both hands keeping your elbows bent. Move your arms and wand/cane to one side.  Your affected arm should be partially relaxed while your unaffected arm performs most of the effort.       4) Shoulder ABDUCTION   While holding a wand/cane palm face up on the injured side and palm face down on the uninjured side, slowly raise up your injured arm to the side.        5) Horizontal Abduction/Adduction      Straight arms holding cane at shoulder height, bring cane to right, center, left. Repeat starting to left.   Copyright  V[employer]. All rights reserved.

## 2021-02-03 NOTE — Therapy (Signed)
[place], [place], [address] Phone: [phone]   Fax:  [phone]  Occupational Therapy Evaluation  Patient Details  Name: Jennifer Jacobs MRN: [id number] Date of Birth: 09/17/1945 Referring Provider (OT): Dr. Arther Abbott   Encounter Date: 02/03/2021   OT End of Session - 02/03/21 1810     Visit Number 1    Number of Visits 8    Date for OT Re-Evaluation 03/05/21    Authorization Type 1) [employer] 2) Medicaid    Authorization Time Period Requesting 8 visits    Authorization - Visit Number 0    Authorization - Number of Visits 8    Progress Note Due on Visit 10    OT Start Time 1430    OT Stop Time 1503    OT Time Calculation (min) 33 min    Activity Tolerance Patient tolerated treatment well    Behavior During Therapy Jennifer Jacobs for tasks assessed/performed             Past Medical History:  Diagnosis Date   Acute blood loss anemia    AKI (acute kidney injury) ([facility]) 01/11/2018   Alcohol abuse 07/05/2014   Allergy    Anxiety    Anxiety and depression    Arthritis    Cognitive deficit due to recent stroke 11/22/2016   COPD (chronic obstructive pulmonary disease) ([facility])    Depression    Dyslipidemia    Fibromyalgia    Hepatic cyst    Hyperlipidemia    Hypertension    Leukocytosis    Liver cyst 03/18/2014   01/26/2014 ultrasound Hypoechoic structure adjacent to gallbladder right lobe liver,  measuring 29 x 18 x 17 mm, possibly septated cyst     MI (myocardial infarction) ([facility]) 2007   By patient report, not substantiated by recent nuclear stress test.   Neuromuscular disorder ([facility])    Seizure disorder ([facility])    none since 2002   Seizures ([facility])    Smoker unmotivated to quit    Quit for 3 years and then restarted   Substance abuse ([facility])    ETOH   Vision abnormalities     Past Surgical History:  Procedure Laterality Date   CATARACT EXTRACTION W/PHACO Right 06/30/2018   Procedure:  CATARACT EXTRACTION PHACO AND INTRAOCULAR LENS PLACEMENT RIGHT EYE;  Surgeon: Tonny Branch, MD;  Location: [facility];  Service: Ophthalmology;  Laterality: Right;  CDE: 9.90   CATARACT EXTRACTION W/PHACO Left 07/14/2018   Procedure: CATARACT EXTRACTION PHACO AND INTRAOCULAR LENS PLACEMENT (IOC);  Surgeon: Tonny Branch, MD;  Location: [facility];  Service: Ophthalmology;  Laterality: Left;  CDE: 6.96   CRANIOTOMY Left 11/19/2016   Procedure: CRANIOTOMY INTRACRANIAL FOR  ANEURYSM CLIPPING;  Surgeon: Kevan Ny Ditty, MD;  Location: [facility];  Service: Neurosurgery;  Laterality: Left;   ELBOW ARTHROPLASTY Left    rods   Exercise tolerance test  04/27/2013   CPET-MET: Submaximal effort (0.96, with goal of greater than 1.09) -- patient states that her effort was limited due to knee pain.  This makes the rest of the interpretation difficult: Peak VO2 - 10.5 (59% moderate), peak 02-pulse -  7..11 (low); heart rate 114 (73% -chronic incompetence considered);; PFTs normal    MOUTH SURGERY     NM MYOVIEW LTD  05/19/2013   LexiScan: EF 80%, no evidence of ischemia or infarction   TRANSTHORACIC ECHOCARDIOGRAM  03/25/2013   EF 55-60%, no regional wall motion abnormalities; normal diastolic parameters, normal  pulmonary pressures.  No valvular lesions noted.  Essentially normal echo     There were no vitals filed for this visit.   Subjective Assessment - 02/03/21 1432     Subjective  S: Sometimes I'm shaking and hurting.    Pertinent History Pt is a 75 y/o female presenting with bilateral shoulder pain that began approximately 1 year ago for unknown reasons. Pt was referred to occupational therapy for evaluation and treatment by Dr. Arther Abbott.    Special Tests FOTO: 53/100    Patient Stated Goals To have less pain in my shoulders    Currently in Pain? Yes    Pain Score 8     Pain Location Shoulder    Pain Orientation Right;Left    Pain Descriptors / Indicators Stabbing    Pain Type Chronic pain    Pain  Radiating Towards neck    Pain Onset More than a month ago    Pain Frequency Intermittent    Aggravating Factors  unsure    Pain Relieving Factors pain medication    Effect of Pain on Daily Activities mod effect on ADLs    Multiple Pain Sites No               [facility] OT Assessment - 02/03/21 1434       Assessment   Medical Diagnosis chronic bilateral shoulder pain    Referring Provider (OT) Dr. Arther Abbott    Onset Date/Surgical Date --   approximately 1 year ago   Hand Dominance Right    Next MD Visit none scheduled    Prior Therapy none      Precautions   Precautions None      Restrictions   Weight Bearing Restrictions No      Balance Screen   Has the patient fallen in the past 6 months Yes    How many times? several    Has the patient had a decrease in activity level because of a fear of falling?  No    Is the patient reluctant to leave their home because of a fear of falling?  No      Prior Function   Level of Independence Requires assistive device for independence;Needs assistance with ADLs    Level of Independence - [place] Minimal    Vocation Retired    Leisure at home. TV watching      ADL   ADL comments Pt is having difficulty with overhead reaching, reaching into the freezers, dressing, reaching behind back. Pt has difficulty with buttons due to tremors. Pt wakes up at night due to shoulder pain      Written Expression   Dominant Hand Right      Cognition   Overall Cognitive Status Within Functional Limits for tasks assessed      Observation/Other Assessments   Focus on Therapeutic Outcomes (FOTO)  53/100      ROM / Strength   AROM / PROM / Strength AROM;Strength      Palpation   Palpation comment mod fascial restrictions along bilateral upper trapezius and scapular regions      AROM   Overall AROM Comments Assessed seated, er/IR adducted    AROM Assessment Site Shoulder    Right/Left Shoulder Right;Left    Right Shoulder Flexion 121 Degrees     Right Shoulder ABduction 120 Degrees    Right Shoulder Internal Rotation 90 Degrees    Right Shoulder External Rotation 85 Degrees    Left Shoulder Flexion 130 Degrees  Left Shoulder ABduction 116 Degrees    Left Shoulder Internal Rotation 90 Degrees    Left Shoulder External Rotation 82 Degrees      Strength   Overall Strength Comments Assessed seated, er/IR adducted    Strength Assessment Site Shoulder    Right/Left Shoulder Right;Left    Right Shoulder Flexion 4+/5    Right Shoulder ABduction 4+/5    Right Shoulder Internal Rotation 5/5    Right Shoulder External Rotation 4+/5    Left Shoulder Flexion 4+/5    Left Shoulder ABduction 4+/5    Left Shoulder Internal Rotation 5/5    Left Shoulder External Rotation 4+/5                             OT Education - 02/03/21 1501     Education Details scapular A/ROM, shoulder AA/ROM    Person(s) Educated Patient    Methods Explanation;Demonstration;Handout    Comprehension Verbalized understanding;Returned demonstration              OT Short Term Goals - 02/03/21 1816       OT SHORT TERM GOAL #1   Title Pt will be provided with and educated on HEP to improve mobility required for ADL completion.    Time 4    Period Weeks    Status New    Target Date 03/05/21      OT SHORT TERM GOAL #2   Title Pt will decrease BUE pain to 3/10 or less to improve ability to sleep for 3+ hours without waking due to pain.    Time 4    Period Weeks    Status New      OT SHORT TERM GOAL #3   Title Pt will decrease fascial restrictions in BUE to minimal amounts to improve mobility required for functional reaching tasks.    Time 4    Period Weeks    Status New      OT SHORT TERM GOAL #4   Title Pt will increase BUE A/ROM to WNL to improve mobility required for dressing and bathing tasks.    Time 4    Period Weeks    Status New                      Plan - 02/03/21 1812     Clinical Impression  Statement A: Pt is a 75 y/o female presenting with bilateral shoulder pain that began approximately 1 year ago for unknown reasons. Pt is experiencing difficulty with ADLs and functional use of BUE. Pt was educated on HEP today and was able to achieve BUE ROM WNL during exercises, OT notes moderate fascial restrictions and trigger points along upper trapezius in BUE.    OT Occupational Profile and History Problem Focused Assessment - Including review of records relating to presenting problem    Occupational performance deficits (Please refer to evaluation for details): ADL's;IADL's;Rest and Sleep;Leisure    Body Structure / Function / Physical Skills ADL;Endurance;UE functional use;Fascial restriction;Pain;ROM;Strength;IADL    Rehab Potential Good    Clinical Decision Making Limited treatment options, no task modification necessary    Comorbidities Affecting Occupational Performance: None    Modification or Assistance to Complete Evaluation  No modification of tasks or assist necessary to complete eval    OT Frequency 2x / week    OT Duration 4 weeks    OT Treatment/Interventions Self-care/ADL training;Ultrasound;DME and/or AE instruction;Patient/family education;Passive range  of motion;Cryotherapy;Electrical Stimulation;Moist Heat;Therapeutic exercise;Manual Therapy;Therapeutic activities    Plan P: Pt will benefit from skilled OT services to decrease pain and fascial restrictions, increase joint ROM, strength, and functional use of BUE. Treatment plan: myofascial release and manual techniques, shoulder stretches, AA/ROM, A/ROM, general BUE scapular mobility/stability work, modalites prn    OT Home Exercise Plan eval: scapular A/ROM, shoulder AA/ROM    Consulted and Agree with Plan of Care Patient             Patient will benefit from skilled therapeutic intervention in order to improve the following deficits and impairments:   Body Structure / Function / Physical Skills: ADL, Endurance, UE  functional use, Fascial restriction, Pain, ROM, Strength, IADL       Visit Diagnosis: Chronic left shoulder pain  Chronic right shoulder pain  Other symptoms and signs involving the musculoskeletal system    Problem List Patient Active Problem List   Diagnosis Date Noted   Acute pulmonary embolism ([facility]) 01/13/2018   Chest pain 01/11/2018   Elevated troponin 01/11/2018   AKI (acute kidney injury) ([facility] [employer]) 01/11/2018   NSTEMI (non-ST elevated myocardial infarction) ([facility]) 01/11/2018   Vascular headache    Seizure prophylaxis    Weakness of left third cranial nerve    Gait disturbance, post-stroke 11/22/2016   Benign essential HTN    Seizure disorder ([facility])    Acute blood loss anemia    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   Bilateral leg pain 06/26/2016   AAA (abdominal aortic aneurysm) without rupture ([facility]) 06/26/2016   Insomnia 06/07/2015   Urinary incontinence 06/07/2015   Chronic pain syndrome 03/04/2015   Fibromyalgia 03/04/2015   Cervical radiculitis 03/04/2015   Lumbar radicular pain 03/04/2015   Major depression 07/05/2014   Claudication, class I ([facility]) 03/18/2013   Smoker unmotivated to quit     Guadelupe Sabin, OTR/L  [phone] 02/03/2021, 6:19 PM  [place], [place], [address] Phone: [phone]   Fax:  [phone]  Name: Jennifer Jacobs MRN: [id number] Date of Birth: 07-24-1946

## 2021-02-03 NOTE — Therapy (Signed)
[place], [place], [address] Phone: [phone]   Fax:  [phone]  Physical Therapy Evaluation  Patient Details  Name: Jennifer Jacobs MRN: [id number] Date of Birth: July 28, 1946 Referring Provider (PT): Carole Civil, MD   Encounter Date: 02/03/2021   PT End of Session - 02/03/21 1346     Visit Number 1    Number of Visits 12    Date for PT Re-Evaluation 03/17/21    Authorization Type [employer] HMO, auth required, visit limit MCR guidelines    PT Start Time 4193    PT Stop Time 1430    PT Time Calculation (min) 45 min    Activity Tolerance Patient limited by fatigue;Patient limited by pain    Behavior During Therapy [facility] for tasks assessed/performed             Past Medical History:  Diagnosis Date   Acute blood loss anemia    AKI (acute kidney injury) ([place]) 01/11/2018   Alcohol abuse 07/05/2014   Allergy    Anxiety    Anxiety and depression    Arthritis    Cognitive deficit due to recent stroke 11/22/2016   COPD (chronic obstructive pulmonary disease) ([place])    Depression    Dyslipidemia    Fibromyalgia    Hepatic cyst    Hyperlipidemia    Hypertension    Leukocytosis    Liver cyst 03/18/2014   01/26/2014 ultrasound Hypoechoic structure adjacent to gallbladder right lobe liver,  measuring 29 x 18 x 17 mm, possibly septated cyst     MI (myocardial infarction) ([place]) 2007   By patient report, not substantiated by recent nuclear stress test.   Neuromuscular disorder ([place])    Seizure disorder ([place])    none since 2002   Seizures ([place])    Smoker unmotivated to quit    Quit for 3 years and then restarted   Substance abuse ([place])    ETOH   Vision abnormalities     Past Surgical History:  Procedure Laterality Date   CATARACT EXTRACTION W/PHACO Right 06/30/2018   Procedure: CATARACT EXTRACTION PHACO AND INTRAOCULAR LENS PLACEMENT RIGHT EYE;  Surgeon: Tonny Branch, MD;  Location: [facility];   Service: Ophthalmology;  Laterality: Right;  CDE: 9.90   CATARACT EXTRACTION W/PHACO Left 07/14/2018   Procedure: CATARACT EXTRACTION PHACO AND INTRAOCULAR LENS PLACEMENT (IOC);  Surgeon: Tonny Branch, MD;  Location: [facility];  Service: Ophthalmology;  Laterality: Left;  CDE: 6.96   CRANIOTOMY Left 11/19/2016   Procedure: CRANIOTOMY INTRACRANIAL FOR  ANEURYSM CLIPPING;  Surgeon: Kevan Ny Ditty, MD;  Location: [place];  Service: Neurosurgery;  Laterality: Left;   ELBOW ARTHROPLASTY Left    rods   Exercise tolerance test  04/27/2013   CPET-MET: Submaximal effort (0.96, with goal of greater than 1.09) -- patient states that her effort was limited due to knee pain.  This makes the rest of the interpretation difficult: Peak VO2 - 10.5 (59% moderate), peak 02-pulse -  7..11 (low); heart rate 114 (73% -chronic incompetence considered);; PFTs normal    MOUTH SURGERY     NM MYOVIEW LTD  05/19/2013   LexiScan: EF 80%, no evidence of ischemia or infarction   TRANSTHORACIC ECHOCARDIOGRAM  03/25/2013   EF 55-60%, no regional wall motion abnormalities; normal diastolic parameters, normal pulmonary pressures.  No valvular lesions noted.  Essentially normal echo     There were no vitals filed for this visit.    Subjective  Assessment - 02/03/21 1346     Subjective 75 year old female comes in with bilateral lower back pain radiating left leg pain down to her foot associated with some knee pain as well.   Also complains of bilateral periscapular pain and lower neck pain  Imaging lumbar spine. Pt reports about 7 falls over the past 6 months. Pt notes multiple areas of discomfort in her lower legs, knees, and tailbone. Pt reports she has been using a rollator for ambulation    How long can you stand comfortably? 10 min    How long can you walk comfortably? 5 min    Diagnostic tests x-ray Facet arthritis L4 and L5 spondylolisthesis grade 1 spondylosis L4-S1    Patient Stated Goals "Get my back to stop hurting and  not fall down so much"    Currently in Pain? Yes    Pain Score 8     Pain Location Back    Pain Orientation Lower    Pain Descriptors / Indicators Aching;Stabbing    Pain Type Chronic pain    Pain Radiating Towards buttocks    Pain Onset More than a month ago    Aggravating Factors  standing/walking    Pain Relieving Factors sitting/laying down                [facility] PT Assessment - 02/03/21 0001       Assessment   Medical Diagnosis LBP    Referring Provider (PT) Carole Civil, MD      Balance Screen   Has the patient fallen in the past 6 months Yes    How many times? 7    Has the patient had a decrease in activity level because of a fear of falling?  No    Is the patient reluctant to leave their home because of a fear of falling?  Yes      [place] Private residence    Living Arrangements Alone    Type of [place] to enter    Entrance Stairs-Number of Steps [place] One level    [place] - 4 wheels;Cane - single point;Shower seat;Toilet riser      Prior Function   Level of Independence Requires assistive device for independence;Needs assistance with ADLs    Level of Independence - [place] Minimal    Vocation Retired    Leisure at home. TV watching      Observation/Other Assessments   Focus on Therapeutic Outcomes (FOTO)  39% function      Coordination   Gross Motor Movements are Fluid and Coordinated Yes      ROM / Strength   AROM / PROM / Strength AROM;Strength      AROM   AROM Assessment Site Lumbar    Lumbar Flexion 50% limited    Lumbar Extension 50% limited    Lumbar - Right Side Bend 50% limited    Lumbar - Left Side Bend 50% limited      Strength   Strength Assessment Site Knee;Hip;Ankle;Lumbar    Right/Left Hip Right;Left    Right Hip Flexion 3+/5    Right Hip Extension 2+/5    Right Hip ABduction 3-/5    Right Hip ADduction 3/5    Left Hip Flexion 3/5     Left Hip Extension 2+/5    Left Hip ABduction 3-/5    Left Hip ADduction 3/5    Right/Left  Knee Right;Left    Right Knee Flexion 3+/5    Right Knee Extension 3+/5    Left Knee Flexion 3/5    Left Knee Extension 3/5    Right/Left Ankle Right;Left    Right Ankle Dorsiflexion 4/5    Right Ankle Plantar Flexion 3-/5    Left Ankle Dorsiflexion 3+/5    Left Ankle Plantar Flexion 2+/5    Lumbar Flexion 2/5      Ambulation/Gait   Ambulation/Gait Yes    Ambulation/Gait Assistance 5: Supervision    Ambulation Distance (Feet) 40 Feet    Assistive device Rolling walker    Gait Pattern Step-through pattern;Decreased step length - right;Decreased step length - left    Ambulation Surface Level;Indoor    Gait velocity decreased    Gait Comments 2MWT                        Objective measurements completed on examination: See above findings.       [facility] Adult PT Treatment/Exercise - 02/03/21 0001       Exercises   Exercises Knee/Hip      Knee/Hip Exercises: Supine   Quad Sets Strengthening;Both;1 set;10 reps    Hip Adduction Isometric Strengthening;Both;1 set;10 reps    Straight Leg Raises Strengthening;Both;1 set;10 reps                    PT Education - 02/03/21 1536     Education Details discussion regarding overall activity level and benefits of regular, consistent strengthening to improve function and decrease pain    Person(s) Educated Patient    Methods Explanation;Handout    Comprehension Verbalized understanding;Need further instruction              PT Short Term Goals - 02/03/21 1540       PT SHORT TERM GOAL #1   Title Patient will be independent with HEP in order to improve functional outcomes.    Time 3    Period Weeks    Status New    Target Date 02/24/21      PT SHORT TERM GOAL #2   Title Patient will report at least 25% improvement in symptoms for improved quality of life.    Baseline 8/10 back pain, self report of 10 min  standing tolerance    Time 3    Period Weeks    Status New    Target Date 02/24/21      PT SHORT TERM GOAL #3   Title Demonstrate improved gait speed and tolerance as evidenced by distanc of 80 ft during 2MWT    Baseline 40 ft with RW    Time 3    Period Weeks    Status New    Target Date 02/24/21               PT Long Term Goals - 02/03/21 1541       PT LONG TERM GOAL #1   Title Patient will improve FOTO score by at least 10 points in order to indicate improved tolerance to activity    Baseline 39% function    Time 6    Period Weeks    Status New    Target Date 03/17/21      PT LONG TERM GOAL #2   Title Demo gross 4/5 BLE strength to improve activity tolerance    Baseline 2+ to 3/5 BLE    Time 6    Period Weeks  Status New    Target Date 03/17/21      PT LONG TERM GOAL #3   Title Patient will report at least 50% improvement in symptoms for improved quality of life.    Baseline 8/10 back pain, self report of 10 min standing tolerance    Time 6    Period Weeks    Status New    Target Date 03/17/21                    Plan - 02/03/21 1538     Clinical Impression Statement Patient is a  81 to lady presenting to physical therapy with c/o LBP and generalized weakness. She presents with pain limited deficits in trunk and BLE strength, ROM, endurance, postural impairments, spinal mobility and functional mobility with ADL. She is having to modify and restrict ADL as indicated by FOTO score as well as subjective information and objective measures which is affecting overall participation. Patient will benefit from skilled physical therapy in order to improve function and reduce impairment.    Personal Factors and Comorbidities Age;Comorbidity 3+;Time since onset of injury/illness/exacerbation    Comorbidities multiple comorbid conditions    Examination-Activity Limitations Bend;Carry;Lift;Stand;Stairs;Squat;Locomotion Level;Transfers     Examination-Participation Restrictions Church;Community Activity;Laundry;Meal Prep    Stability/Clinical Decision Making Evolving/Moderate complexity    Clinical Decision Making Moderate    Rehab Potential Good    PT Frequency 2x / week    PT Duration 6 weeks    PT Treatment/Interventions ADLs/Self Care Home Management;Electrical Stimulation;DME Instruction;Traction;Gait training;Stair training;Functional mobility training;Therapeutic activities;Therapeutic exercise;Balance training;Patient/family education;Neuromuscular re-education;Manual techniques;Passive range of motion;Spinal Manipulations;Joint Manipulations;Taping    PT Next Visit Plan Continue with BLE strengthening, core stabilization    PT Home Exercise Plan QS, hip add isometric, SLR    Consulted and Agree with Plan of Care Patient             Patient will benefit from skilled therapeutic intervention in order to improve the following deficits and impairments:  Abnormal gait, Decreased activity tolerance, Decreased balance, Decreased mobility, Decreased endurance, Decreased strength, Difficulty walking, Postural dysfunction, Improper body mechanics, Pain  Visit Diagnosis: Chronic midline low back pain, unspecified whether sciatica present  Muscle weakness (generalized)  Difficulty in walking, not elsewhere classified     Problem List Patient Active Problem List   Diagnosis Date Noted   Acute pulmonary embolism ([place]) 01/13/2018   Chest pain 01/11/2018   Elevated troponin 01/11/2018   AKI (acute kidney injury) ([place]) 01/11/2018   NSTEMI (non-ST elevated myocardial infarction) ([place]) 01/11/2018   Vascular headache    Seizure prophylaxis    Weakness of left third cranial nerve    Gait disturbance, post-stroke 11/22/2016   Benign essential HTN    Seizure disorder ([place])    Acute blood loss anemia    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   Bilateral leg pain 06/26/2016   AAA (abdominal aortic aneurysm)  without rupture ([place]) 06/26/2016   Insomnia 06/07/2015   Urinary incontinence 06/07/2015   Chronic pain syndrome 03/04/2015   Fibromyalgia 03/04/2015   Cervical radiculitis 03/04/2015   Lumbar radicular pain 03/04/2015   Major depression 07/05/2014   Claudication, class I ([place]) 03/18/2013   Smoker unmotivated to quit    3:45 PM, 02/03/21 M. Sherlyn Lees, PT, DPT Physical Therapist- [place] Office Number: [phone]   [place] [address] [place], [place], [address] Phone: [phone]   Fax:  [phone]  Name: Jennifer Jacobs MRN: [id number]  Date of Birth: [birth year]

## 2021-02-06 ENCOUNTER — Encounter (HOSPITAL_COMMUNITY): Payer: Self-pay | Admitting: Physical Therapy

## 2021-02-06 ENCOUNTER — Ambulatory Visit (HOSPITAL_COMMUNITY): Payer: Medicare HMO | Admitting: Physical Therapy

## 2021-02-06 ENCOUNTER — Other Ambulatory Visit: Payer: Self-pay

## 2021-02-06 DIAGNOSIS — M25512 Pain in left shoulder: Secondary | ICD-10-CM | POA: Diagnosis not present

## 2021-02-06 DIAGNOSIS — M6281 Muscle weakness (generalized): Secondary | ICD-10-CM

## 2021-02-06 DIAGNOSIS — M25511 Pain in right shoulder: Secondary | ICD-10-CM | POA: Diagnosis not present

## 2021-02-06 DIAGNOSIS — M545 Low back pain, unspecified: Secondary | ICD-10-CM | POA: Diagnosis not present

## 2021-02-06 DIAGNOSIS — G8929 Other chronic pain: Secondary | ICD-10-CM | POA: Diagnosis not present

## 2021-02-06 DIAGNOSIS — R262 Difficulty in walking, not elsewhere classified: Secondary | ICD-10-CM

## 2021-02-06 DIAGNOSIS — R29898 Other symptoms and signs involving the musculoskeletal system: Secondary | ICD-10-CM | POA: Diagnosis not present

## 2021-02-06 NOTE — Therapy (Signed)
[place], [place], [address] Phone: [phone]   Fax:  [phone]  Physical Therapy Treatment  Patient Details  Name: Jennifer Jacobs MRN: [id number] Date of Birth: 1946-06-04 Referring Provider (PT): Carole Civil, MD   Encounter Date: 02/06/2021   PT End of Session - 02/06/21 1335     Visit Number 2    Number of Visits 12    Date for PT Re-Evaluation 03/17/21    Authorization Type [employer] Medicare HMO, auth required, visit limit MCR guidelines    PT Start Time 1335   arrives late   PT Stop Time 1355    PT Time Calculation (min) 20 min    Activity Tolerance Patient limited by fatigue;Patient limited by pain    Behavior During Therapy [facility] for tasks assessed/performed             Past Medical History:  Diagnosis Date   Acute blood loss anemia    AKI (acute kidney injury) ([place]) 01/11/2018   Alcohol abuse 07/05/2014   Allergy    Anxiety    Anxiety and depression    Arthritis    Cognitive deficit due to recent stroke 11/22/2016   COPD (chronic obstructive pulmonary disease) ([place])    Depression    Dyslipidemia    Fibromyalgia    Hepatic cyst    Hyperlipidemia    Hypertension    Leukocytosis    Liver cyst 03/18/2014   01/26/2014 ultrasound Hypoechoic structure adjacent to gallbladder right lobe liver,  measuring 29 x 18 x 17 mm, possibly septated cyst     MI (myocardial infarction) ([place]) 2007   By patient report, not substantiated by recent nuclear stress test.   Neuromuscular disorder ([place])    Seizure disorder ([place])    none since 2002   Seizures ([place])    Smoker unmotivated to quit    Quit for 3 years and then restarted   Substance abuse ([place])    ETOH   Vision abnormalities     Past Surgical History:  Procedure Laterality Date   CATARACT EXTRACTION W/PHACO Right 06/30/2018   Procedure: CATARACT EXTRACTION PHACO AND INTRAOCULAR LENS PLACEMENT RIGHT EYE;  Surgeon: Tonny Branch, MD;   Location: [facility];  Service: Ophthalmology;  Laterality: Right;  CDE: 9.90   CATARACT EXTRACTION W/PHACO Left 07/14/2018   Procedure: CATARACT EXTRACTION PHACO AND INTRAOCULAR LENS PLACEMENT (IOC);  Surgeon: Tonny Branch, MD;  Location: [facility];  Service: Ophthalmology;  Laterality: Left;  CDE: 6.96   CRANIOTOMY Left 11/19/2016   Procedure: CRANIOTOMY INTRACRANIAL FOR  ANEURYSM CLIPPING;  Surgeon: Kevan Ny Ditty, MD;  Location: [place];  Service: Neurosurgery;  Laterality: Left;   ELBOW ARTHROPLASTY Left    rods   Exercise tolerance test  04/27/2013   CPET-MET: Submaximal effort (0.96, with goal of greater than 1.09) -- patient states that her effort was limited due to knee pain.  This makes the rest of the interpretation difficult: Peak VO2 - 10.5 (59% moderate), peak 02-pulse -  7..11 (low); heart rate 114 (73% -chronic incompetence considered);; PFTs normal    MOUTH SURGERY     NM MYOVIEW LTD  05/19/2013   LexiScan: EF 80%, no evidence of ischemia or infarction   TRANSTHORACIC ECHOCARDIOGRAM  03/25/2013   EF 55-60%, no regional wall motion abnormalities; normal diastolic parameters, normal pulmonary pressures.  No valvular lesions noted.  Essentially normal echo     There were no vitals filed for this visit.  Subjective Assessment - 02/06/21 1336     Subjective Patient states her legs and back are sore. Her exercises make her sore.    How long can you stand comfortably? 10 min    How long can you walk comfortably? 5 min    Diagnostic tests x-ray Facet arthritis L4 and L5 spondylolisthesis grade 1 spondylosis L4-S1    Patient Stated Goals "Get my back to stop hurting and not fall down so much"    Currently in Pain? Yes    Pain Score 8     Pain Location Back    Pain Orientation Lateral    Pain Type Chronic pain    Pain Onset More than a month ago                               [facility] Adult PT Treatment/Exercise - 02/06/21 0001       Knee/Hip Exercises: Standing    Heel Raises Both;1 set;10 reps    Heel Raises Limitations TR x 10    Knee Flexion Both;2 sets;10 reps    Hip Flexion Both;2 sets;10 reps    Hip Abduction Both;2 sets;10 reps      Knee/Hip Exercises: Seated   Long Arc Quad Both;2 sets;5 reps    Long Arc Quad Limitations 5 second holds                    PT Education - 02/06/21 1335     Education Details HEP, exercise mechanics    Person(s) Educated Patient    Methods Explanation;Demonstration    Comprehension Verbalized understanding;Returned demonstration              PT Short Term Goals - 02/03/21 1540       PT SHORT TERM GOAL #1   Title Patient will be independent with HEP in order to improve functional outcomes.    Time 3    Period Weeks    Status New    Target Date 02/24/21      PT SHORT TERM GOAL #2   Title Patient will report at least 25% improvement in symptoms for improved quality of life.    Baseline 8/10 back pain, self report of 10 min standing tolerance    Time 3    Period Weeks    Status New    Target Date 02/24/21      PT SHORT TERM GOAL #3   Title Demonstrate improved gait speed and tolerance as evidenced by distanc of 80 ft during 2MWT    Baseline 40 ft with RW    Time 3    Period Weeks    Status New    Target Date 02/24/21               PT Long Term Goals - 02/03/21 1541       PT LONG TERM GOAL #1   Title Patient will improve FOTO score by at least 10 points in order to indicate improved tolerance to activity    Baseline 39% function    Time 6    Period Weeks    Status New    Target Date 03/17/21      PT LONG TERM GOAL #2   Title Demo gross 4/5 BLE strength to improve activity tolerance    Baseline 2+ to 3/5 BLE    Time 6    Period Weeks    Status New  Target Date 03/17/21      PT LONG TERM GOAL #3   Title Patient will report at least 50% improvement in symptoms for improved quality of life.    Baseline 8/10 back pain, self report of 10 min standing  tolerance    Time 6    Period Weeks    Status New    Target Date 03/17/21                   Plan - 02/06/21 1335     Clinical Impression Statement Session limited as patient arrives late for session. Began standing LE strengthening today which patient tolerates well. Patient notes fatigue in LE with exercises performed. Patient requires bilateral UE support for balance will all exercises completed today. Patient will continue to benefit from skilled physical therapy in order to reduce impairment and improve function.    Personal Factors and Comorbidities Age;Comorbidity 3+;Time since onset of injury/illness/exacerbation    Comorbidities multiple comorbid conditions    Examination-Activity Limitations Bend;Carry;Lift;Stand;Stairs;Squat;Locomotion Level;Transfers    Examination-Participation Restrictions Church;Community Activity;Laundry;Meal Prep    Stability/Clinical Decision Making Evolving/Moderate complexity    Rehab Potential Good    PT Frequency 2x / week    PT Duration 6 weeks    PT Treatment/Interventions ADLs/Self Care Home Management;Electrical Stimulation;DME Instruction;Traction;Gait training;Stair training;Functional mobility training;Therapeutic activities;Therapeutic exercise;Balance training;Patient/family education;Neuromuscular re-education;Manual techniques;Passive range of motion;Spinal Manipulations;Joint Manipulations;Taping    PT Next Visit Plan Continue with BLE strengthening, core stabilization    PT Home Exercise Plan QS, hip add isometric, SLR    Consulted and Agree with Plan of Care Patient             Patient will benefit from skilled therapeutic intervention in order to improve the following deficits and impairments:  Abnormal gait, Decreased activity tolerance, Decreased balance, Decreased mobility, Decreased endurance, Decreased strength, Difficulty walking, Postural dysfunction, Improper body mechanics, Pain  Visit Diagnosis: Chronic midline  low back pain, unspecified whether sciatica present  Muscle weakness (generalized)  Difficulty in walking, not elsewhere classified     Problem List Patient Active Problem List   Diagnosis Date Noted   Acute pulmonary embolism ([place]) 01/13/2018   Chest pain 01/11/2018   Elevated troponin 01/11/2018   AKI (acute kidney injury) ([place]) 01/11/2018   NSTEMI (non-ST elevated myocardial infarction) ([place]) 01/11/2018   Vascular headache    Seizure prophylaxis    Weakness of left third cranial nerve    Gait disturbance, post-stroke 11/22/2016   Benign essential HTN    Seizure disorder ([place])    Acute blood loss anemia    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   Bilateral leg pain 06/26/2016   AAA (abdominal aortic aneurysm) without rupture ([place]) 06/26/2016   Insomnia 06/07/2015   Urinary incontinence 06/07/2015   Chronic pain syndrome 03/04/2015   Fibromyalgia 03/04/2015   Cervical radiculitis 03/04/2015   Lumbar radicular pain 03/04/2015   Major depression 07/05/2014   Claudication, class I ([place]) 03/18/2013   Smoker unmotivated to quit     1:58 PM, 02/06/21 Mearl Latin PT, DPT Physical Therapist at [place], [place], [address] Phone: [phone]   Fax:  [phone]  Name: Jennifer Jacobs MRN: [id number] Date of Birth: 08/17/1946

## 2021-02-08 ENCOUNTER — Other Ambulatory Visit: Payer: Self-pay

## 2021-02-08 ENCOUNTER — Encounter (HOSPITAL_COMMUNITY): Payer: Self-pay | Admitting: Physical Therapy

## 2021-02-08 ENCOUNTER — Ambulatory Visit (HOSPITAL_COMMUNITY): Payer: Medicare HMO | Admitting: Physical Therapy

## 2021-02-08 DIAGNOSIS — M25511 Pain in right shoulder: Secondary | ICD-10-CM | POA: Diagnosis not present

## 2021-02-08 DIAGNOSIS — R262 Difficulty in walking, not elsewhere classified: Secondary | ICD-10-CM | POA: Diagnosis not present

## 2021-02-08 DIAGNOSIS — M6281 Muscle weakness (generalized): Secondary | ICD-10-CM | POA: Diagnosis not present

## 2021-02-08 DIAGNOSIS — M545 Low back pain, unspecified: Secondary | ICD-10-CM | POA: Diagnosis not present

## 2021-02-08 DIAGNOSIS — G8929 Other chronic pain: Secondary | ICD-10-CM

## 2021-02-08 DIAGNOSIS — M25512 Pain in left shoulder: Secondary | ICD-10-CM | POA: Diagnosis not present

## 2021-02-08 DIAGNOSIS — R29898 Other symptoms and signs involving the musculoskeletal system: Secondary | ICD-10-CM | POA: Diagnosis not present

## 2021-02-08 NOTE — Therapy (Signed)
[place], [place], [address] Phone: [phone]   Fax:  [phone]  Physical Therapy Treatment  Patient Details  Name: Jennifer Jacobs MRN: [id number] Date of Birth: 1945/12/17 Referring Provider (PT): Carole Civil, MD   Encounter Date: 02/08/2021   PT End of Session - 02/08/21 1440     Visit Number 3    Number of Visits 12    Date for PT Re-Evaluation 03/17/21    Authorization Type [employer] HMO, auth required, visit limit MCR guidelines    PT Start Time 1440    PT Stop Time 1520    PT Time Calculation (min) 40 min    Activity Tolerance Patient limited by fatigue;Patient limited by pain    Behavior During Therapy [facility] for tasks assessed/performed             Past Medical History:  Diagnosis Date   Acute blood loss anemia    AKI (acute kidney injury) ([place]) 01/11/2018   Alcohol abuse 07/05/2014   Allergy    Anxiety    Anxiety and depression    Arthritis    Cognitive deficit due to recent stroke 11/22/2016   COPD (chronic obstructive pulmonary disease) ([place])    Depression    Dyslipidemia    Fibromyalgia    Hepatic cyst    Hyperlipidemia    Hypertension    Leukocytosis    Liver cyst 03/18/2014   01/26/2014 ultrasound Hypoechoic structure adjacent to gallbladder right lobe liver,  measuring 29 x 18 x 17 mm, possibly septated cyst     MI (myocardial infarction) ([place]) 2007   By patient report, not substantiated by recent nuclear stress test.   Neuromuscular disorder ([place])    Seizure disorder ([place])    none since 2002   Seizures ([place])    Smoker unmotivated to quit    Quit for 3 years and then restarted   Substance abuse ([place])    ETOH   Vision abnormalities     Past Surgical History:  Procedure Laterality Date   CATARACT EXTRACTION W/PHACO Right 06/30/2018   Procedure: CATARACT EXTRACTION PHACO AND INTRAOCULAR LENS PLACEMENT RIGHT EYE;  Surgeon: Tonny Branch, MD;  Location: [facility];   Service: Ophthalmology;  Laterality: Right;  CDE: 9.90   CATARACT EXTRACTION W/PHACO Left 07/14/2018   Procedure: CATARACT EXTRACTION PHACO AND INTRAOCULAR LENS PLACEMENT (IOC);  Surgeon: Tonny Branch, MD;  Location: [facility];  Service: Ophthalmology;  Laterality: Left;  CDE: 6.96   CRANIOTOMY Left 11/19/2016   Procedure: CRANIOTOMY INTRACRANIAL FOR  ANEURYSM CLIPPING;  Surgeon: Kevan Ny Ditty, MD;  Location: [facility];  Service: Neurosurgery;  Laterality: Left;   ELBOW ARTHROPLASTY Left    rods   Exercise tolerance test  04/27/2013   CPET-MET: Submaximal effort (0.96, with goal of greater than 1.09) -- patient states that her effort was limited due to knee pain.  This makes the rest of the interpretation difficult: Peak VO2 - 10.5 (59% moderate), peak 02-pulse -  7..11 (low); heart rate 114 (73% -chronic incompetence considered);; PFTs normal    MOUTH SURGERY     NM MYOVIEW LTD  05/19/2013   LexiScan: EF 80%, no evidence of ischemia or infarction   TRANSTHORACIC ECHOCARDIOGRAM  03/25/2013   EF 55-60%, no regional wall motion abnormalities; normal diastolic parameters, normal pulmonary pressures.  No valvular lesions noted.  Essentially normal echo     There were no vitals filed for this visit.   Subjective Assessment -  02/08/21 1441     Subjective Patient states she is feeling off balance today. Patient has been working on exercises and they make her sore. Neck is sore sometimes but not much.    How long can you stand comfortably? 10 min    How long can you walk comfortably? 5 min    Diagnostic tests x-ray Facet arthritis L4 and L5 spondylolisthesis grade 1 spondylosis L4-S1    Patient Stated Goals "Get my back to stop hurting and not fall down so much"    Currently in Pain? Yes    Pain Score 8     Pain Location Knee    Pain Orientation Right;Left    Pain Descriptors / Indicators Aching    Pain Type Chronic pain    Pain Onset More than a month ago    Pain Frequency Constant                                [facility] Adult PT Treatment/Exercise - 02/08/21 0001       Knee/Hip Exercises: Standing   Heel Raises Both;1 set;15 reps    Heel Raises Limitations TR x 15    Knee Flexion Both;2 sets;15 reps    Knee Flexion Limitations 1#    Hip Flexion Both;2 sets;15 reps    Hip Flexion Limitations 1#    Hip Abduction Both;2 sets;15 reps    Abduction Limitations 1#      Knee/Hip Exercises: Seated   Long Arc Quad Both;2 sets;5 reps    Long Arc Quad Limitations 5 second holds    Other Seated Knee/Hip Exercises hip abduction isometric 10x 10 second holds                    PT Education - 02/08/21 1441     Education Details HEP    Person(s) Educated Patient    Methods Explanation;Demonstration    Comprehension Verbalized understanding;Returned demonstration              PT Short Term Goals - 02/03/21 1540       PT SHORT TERM GOAL #1   Title Patient will be independent with HEP in order to improve functional outcomes.    Time 3    Period Weeks    Status New    Target Date 02/24/21      PT SHORT TERM GOAL #2   Title Patient will report at least 25% improvement in symptoms for improved quality of life.    Baseline 8/10 back pain, self report of 10 min standing tolerance    Time 3    Period Weeks    Status New    Target Date 02/24/21      PT SHORT TERM GOAL #3   Title Demonstrate improved gait speed and tolerance as evidenced by distanc of 80 ft during 2MWT    Baseline 40 ft with RW    Time 3    Period Weeks    Status New    Target Date 02/24/21               PT Long Term Goals - 02/03/21 1541       PT LONG TERM GOAL #1   Title Patient will improve FOTO score by at least 10 points in order to indicate improved tolerance to activity    Baseline 39% function    Time 6    Period Weeks  Status New    Target Date 03/17/21      PT LONG TERM GOAL #2   Title Demo gross 4/5 BLE strength to improve activity tolerance     Baseline 2+ to 3/5 BLE    Time 6    Period Weeks    Status New    Target Date 03/17/21      PT LONG TERM GOAL #3   Title Patient will report at least 50% improvement in symptoms for improved quality of life.    Baseline 8/10 back pain, self report of 10 min standing tolerance    Time 6    Period Weeks    Status New    Target Date 03/17/21                   Plan - 02/08/21 1440     Clinical Impression Statement Patient requires cueing for mechanics of previously performed exercises. Patient tolerates increased reps/resistance of previously performed exercises and requires intermittent seated rest breaks throughout session. Patient notes fatigue at end of session today and is limited by fatigue. Patient will continue to benefit from skilled physical therapy in order to reduce impairment and improve function.    Personal Factors and Comorbidities Age;Comorbidity 3+;Time since onset of injury/illness/exacerbation    Comorbidities multiple comorbid conditions    Examination-Activity Limitations Bend;Carry;Lift;Stand;Stairs;Squat;Locomotion Level;Transfers    Examination-Participation Restrictions Church;Community Activity;Laundry;Meal Prep    Stability/Clinical Decision Making Evolving/Moderate complexity    Rehab Potential Good    PT Frequency 2x / week    PT Duration 6 weeks    PT Treatment/Interventions ADLs/Self Care Home Management;Electrical Stimulation;DME Instruction;Traction;Gait training;Stair training;Functional mobility training;Therapeutic activities;Therapeutic exercise;Balance training;Patient/family education;Neuromuscular re-education;Manual techniques;Passive range of motion;Spinal Manipulations;Joint Manipulations;Taping    PT Next Visit Plan Continue with BLE strengthening, core stabilization    PT Home Exercise Plan QS, hip add isometric, SLR 6/22 HR, hip abd standing at sink    Consulted and Agree with Plan of Care Patient             Patient will  benefit from skilled therapeutic intervention in order to improve the following deficits and impairments:  Abnormal gait, Decreased activity tolerance, Decreased balance, Decreased mobility, Decreased endurance, Decreased strength, Difficulty walking, Postural dysfunction, Improper body mechanics, Pain  Visit Diagnosis: Chronic midline low back pain, unspecified whether sciatica present  Muscle weakness (generalized)  Difficulty in walking, not elsewhere classified     Problem List Patient Active Problem List   Diagnosis Date Noted   Acute pulmonary embolism ([place]) 01/13/2018   Chest pain 01/11/2018   Elevated troponin 01/11/2018   AKI (acute kidney injury) ([place]) 01/11/2018   NSTEMI (non-ST elevated myocardial infarction) ([place]) 01/11/2018   Vascular headache    Seizure prophylaxis    Weakness of left third cranial nerve    Gait disturbance, post-stroke 11/22/2016   Benign essential HTN    Seizure disorder ([place])    Acute blood loss anemia    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   Bilateral leg pain 06/26/2016   AAA (abdominal aortic aneurysm) without rupture ([place]) 06/26/2016   Insomnia 06/07/2015   Urinary incontinence 06/07/2015   Chronic pain syndrome 03/04/2015   Fibromyalgia 03/04/2015   Cervical radiculitis 03/04/2015   Lumbar radicular pain 03/04/2015   Major depression 07/05/2014   Claudication, class I ([place]) 03/18/2013   Smoker unmotivated to quit     3:21 PM, 02/08/21 Mearl Latin PT, DPT Physical Therapist at [facility]  [place], [place], [address] Phone: [phone]   Fax:  [phone]  Name: Jennifer Jacobs MRN: [id number] Date of Birth: December 09, 1945

## 2021-02-08 NOTE — Patient Instructions (Signed)
Access Code: [email or web address] URL: [email or web address] Date: 02/08/2021 Prepared by: Greig Castilla Zade Falkner  Exercises Heel rises with counter support - 1 x daily - 7 x weekly - 2 sets - 15 reps Standing Hip Abduction with Counter Support - 1 x daily - 7 x weekly - 2 sets - 15 reps

## 2021-02-10 ENCOUNTER — Ambulatory Visit (HOSPITAL_COMMUNITY): Payer: Medicare HMO

## 2021-02-10 ENCOUNTER — Encounter (HOSPITAL_COMMUNITY): Payer: Self-pay

## 2021-02-10 ENCOUNTER — Ambulatory Visit (HOSPITAL_COMMUNITY): Payer: Medicare HMO | Admitting: Specialist

## 2021-02-10 ENCOUNTER — Other Ambulatory Visit: Payer: Self-pay

## 2021-02-10 DIAGNOSIS — R262 Difficulty in walking, not elsewhere classified: Secondary | ICD-10-CM | POA: Diagnosis not present

## 2021-02-10 DIAGNOSIS — G8929 Other chronic pain: Secondary | ICD-10-CM | POA: Diagnosis not present

## 2021-02-10 DIAGNOSIS — M6281 Muscle weakness (generalized): Secondary | ICD-10-CM | POA: Diagnosis not present

## 2021-02-10 DIAGNOSIS — M25512 Pain in left shoulder: Secondary | ICD-10-CM | POA: Diagnosis not present

## 2021-02-10 DIAGNOSIS — M545 Low back pain, unspecified: Secondary | ICD-10-CM | POA: Diagnosis not present

## 2021-02-10 DIAGNOSIS — R29898 Other symptoms and signs involving the musculoskeletal system: Secondary | ICD-10-CM | POA: Diagnosis not present

## 2021-02-10 DIAGNOSIS — M25511 Pain in right shoulder: Secondary | ICD-10-CM | POA: Diagnosis not present

## 2021-02-10 NOTE — Therapy (Signed)
[place], [place], [address] Phone: [phone]   Fax:  [phone]  Physical Therapy Treatment  Patient Details  Name: Jennifer Jacobs MRN: [id number] Date of Birth: April 01, 1946 Referring Provider (PT): Carole Civil, MD   Encounter Date: 02/10/2021   PT End of Session - 02/10/21 1432     Visit Number 4    Number of Visits 12    Date for PT Re-Evaluation 03/17/21    Authorization Type [employer] HMO, auth required, visit limit MCR guidelines    PT Start Time 1430    PT Stop Time 1515    PT Time Calculation (min) 45 min    Activity Tolerance Patient limited by fatigue;Patient limited by pain    Behavior During Therapy [facility] for tasks assessed/performed             Past Medical History:  Diagnosis Date   Acute blood loss anemia    AKI (acute kidney injury) ([place]) 01/11/2018   Alcohol abuse 07/05/2014   Allergy    Anxiety    Anxiety and depression    Arthritis    Cognitive deficit due to recent stroke 11/22/2016   COPD (chronic obstructive pulmonary disease) ([place])    Depression    Dyslipidemia    Fibromyalgia    Hepatic cyst    Hyperlipidemia    Hypertension    Leukocytosis    Liver cyst 03/18/2014   01/26/2014 ultrasound Hypoechoic structure adjacent to gallbladder right lobe liver,  measuring 29 x 18 x 17 mm, possibly septated cyst     MI (myocardial infarction) ([place]) 2007   By patient report, not substantiated by recent nuclear stress test.   Neuromuscular disorder ([place])    Seizure disorder ([place])    none since 2002   Seizures ([place])    Smoker unmotivated to quit    Quit for 3 years and then restarted   Substance abuse ([place])    ETOH   Vision abnormalities     Past Surgical History:  Procedure Laterality Date   CATARACT EXTRACTION W/PHACO Right 06/30/2018   Procedure: CATARACT EXTRACTION PHACO AND INTRAOCULAR LENS PLACEMENT RIGHT EYE;  Surgeon: Tonny Branch, MD;  Location: [facility];   Service: Ophthalmology;  Laterality: Right;  CDE: 9.90   CATARACT EXTRACTION W/PHACO Left 07/14/2018   Procedure: CATARACT EXTRACTION PHACO AND INTRAOCULAR LENS PLACEMENT (IOC);  Surgeon: Tonny Branch, MD;  Location: [facility];  Service: Ophthalmology;  Laterality: Left;  CDE: 6.96   CRANIOTOMY Left 11/19/2016   Procedure: CRANIOTOMY INTRACRANIAL FOR  ANEURYSM CLIPPING;  Surgeon: Kevan Ny Ditty, MD;  Location: [facility] [place];  Service: Neurosurgery;  Laterality: Left;   ELBOW ARTHROPLASTY Left    rods   Exercise tolerance test  04/27/2013   CPET-MET: Submaximal effort (0.96, with goal of greater than 1.09) -- patient states that her effort was limited due to knee pain.  This makes the rest of the interpretation difficult: Peak VO2 - 10.5 (59% moderate), peak 02-pulse -  7..11 (low); heart rate 114 (73% -chronic incompetence considered);; PFTs normal    MOUTH SURGERY     NM MYOVIEW LTD  05/19/2013   LexiScan: EF 80%, no evidence of ischemia or infarction   TRANSTHORACIC ECHOCARDIOGRAM  03/25/2013   EF 55-60%, no regional wall motion abnormalities; normal diastolic parameters, normal pulmonary pressures.  No valvular lesions noted.  Essentially normal echo     There were no vitals filed for this visit.   Subjective Assessment -  02/10/21 1431     Subjective "I'm tired from mowing the lawn, my back isn't hurting as much, just my tailbone from riding the lawn mower"    How long can you stand comfortably? 10 min    How long can you walk comfortably? 5 min    Diagnostic tests x-ray Facet arthritis L4 and L5 spondylolisthesis grade 1 spondylosis L4-S1    Patient Stated Goals "Get my back to stop hurting and not fall down so much"    Currently in Pain? No/denies    Pain Onset More than a month ago                               [facility] Adult PT Treatment/Exercise - 02/10/21 0001       Exercises   Exercises Lumbar      Lumbar Exercises: Stretches   Single Knee to Chest Stretch  Right;Left;2 reps;30 seconds    Double Knee to Chest Stretch 2 reps;30 seconds      Lumbar Exercises: Supine   Ab Set 20 reps;2 seconds      Knee/Hip Exercises: Aerobic   Recumbent Bike level 3 x 5 min to improve activity tolerance      Knee/Hip Exercises: Standing   Heel Raises Both;2 sets;10 reps    Knee Flexion Strengthening;Both;2 sets;10 reps    Knee Flexion Limitations 2#    Hip Flexion Stengthening;Both;2 sets;10 reps    Hip Flexion Limitations 2#    Hip Abduction Stengthening;Both;2 sets;10 reps    Abduction Limitations 2#      Knee/Hip Exercises: Seated   Long Arc Quad Strengthening;Both;2 sets;10 reps    Long Arc Quad Weight 2 lbs.    Ball Squeeze 2x10                    PT Education - 02/10/21 1516     Education Details education on lumbar spine mechanics, DJD, and benefits of resistance training to improve functional status    Person(s) Educated Patient    Methods Explanation;Demonstration;Handout    Comprehension Verbalized understanding              PT Short Term Goals - 02/03/21 1540       PT SHORT TERM GOAL #1   Title Patient will be independent with HEP in order to improve functional outcomes.    Time 3    Period Weeks    Status New    Target Date 02/24/21      PT SHORT TERM GOAL #2   Title Patient will report at least 25% improvement in symptoms for improved quality of life.    Baseline 8/10 back pain, self report of 10 min standing tolerance    Time 3    Period Weeks    Status New    Target Date 02/24/21      PT SHORT TERM GOAL #3   Title Demonstrate improved gait speed and tolerance as evidenced by distanc of 80 ft during 2MWT    Baseline 40 ft with RW    Time 3    Period Weeks    Status New    Target Date 02/24/21               PT Long Term Goals - 02/03/21 1541       PT LONG TERM GOAL #1   Title Patient will improve FOTO score by at least 10 points in order to  indicate improved tolerance to activity     Baseline 39% function    Time 6    Period Weeks    Status New    Target Date 03/17/21      PT LONG TERM GOAL #2   Title Demo gross 4/5 BLE strength to improve activity tolerance    Baseline 2+ to 3/5 BLE    Time 6    Period Weeks    Status New    Target Date 03/17/21      PT LONG TERM GOAL #3   Title Patient will report at least 50% improvement in symptoms for improved quality of life.    Baseline 8/10 back pain, self report of 10 min standing tolerance    Time 6    Period Weeks    Status New    Target Date 03/17/21                   Plan - 02/10/21 1511     Clinical Impression Statement Pt tolerated tx well with onset of fatigue requiring seated rest periods.  Improved sequence and execution of ther ex requiring less cues for proper form.  Continued tx indicated to progress HEP with emphasis on improved trunk strength for lumbar stability.    Personal Factors and Comorbidities Age;Comorbidity 3+;Time since onset of injury/illness/exacerbation    Comorbidities multiple comorbid conditions    Examination-Activity Limitations Bend;Carry;Lift;Stand;Stairs;Squat;Locomotion Level;Transfers    Examination-Participation Restrictions Church;Community Activity;Laundry;Meal Prep    Stability/Clinical Decision Making Evolving/Moderate complexity    Rehab Potential Good    PT Frequency 2x / week    PT Duration 6 weeks    PT Treatment/Interventions ADLs/Self Care Home Management;Electrical Stimulation;DME Instruction;Traction;Gait training;Stair training;Functional mobility training;Therapeutic activities;Therapeutic exercise;Balance training;Patient/family education;Neuromuscular re-education;Manual techniques;Passive range of motion;Spinal Manipulations;Joint Manipulations;Taping    PT Next Visit Plan Continue with BLE strengthening, core stabilization    PT Home Exercise Plan QS, hip add isometric, SLR 6/22 HR, hip abd standing at sink. PPT, SKTC, DKTC    Consulted and Agree with  Plan of Care Patient             Patient will benefit from skilled therapeutic intervention in order to improve the following deficits and impairments:  Abnormal gait, Decreased activity tolerance, Decreased balance, Decreased mobility, Decreased endurance, Decreased strength, Difficulty walking, Postural dysfunction, Improper body mechanics, Pain  Visit Diagnosis: Chronic midline low back pain, unspecified whether sciatica present  Muscle weakness (generalized)  Difficulty in walking, not elsewhere classified     Problem List Patient Active Problem List   Diagnosis Date Noted   Acute pulmonary embolism ([place]) 01/13/2018   Chest pain 01/11/2018   Elevated troponin 01/11/2018   AKI (acute kidney injury) ([place]) 01/11/2018   NSTEMI (non-ST elevated myocardial infarction) ([place]) 01/11/2018   Vascular headache    Seizure prophylaxis    Weakness of left third cranial nerve    Gait disturbance, post-stroke 11/22/2016   Benign essential HTN    Seizure disorder ([place])    Acute blood loss anemia    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   Bilateral leg pain 06/26/2016   AAA (abdominal aortic aneurysm) without rupture ([place]) 06/26/2016   Insomnia 06/07/2015   Urinary incontinence 06/07/2015   Chronic pain syndrome 03/04/2015   Fibromyalgia 03/04/2015   Cervical radiculitis 03/04/2015   Lumbar radicular pain 03/04/2015   Major depression 07/05/2014   Claudication, class I ([place]) 03/18/2013   Smoker unmotivated to quit    3:18 PM, 02/10/21 M. Claiborne Billings  Freddie Apley PT, DPT Physical TherapistRoyston Sinner Office Number: [place] [address] [place], [place], [address] Phone: [phone]   Fax:  [phone]  Name: TRISTINA SAHAGIAN MRN: [id number] Date of Birth: 1946/01/02

## 2021-02-10 NOTE — Patient Instructions (Signed)
Access Code: [email or web address] URL: [email or web address] Date: 02/10/2021 Prepared by: Shary Decamp  Exercises Supine Posterior Pelvic Tilt - 1 x daily - 7 x weekly - 3 sets - 10 reps - 2 sec hold Supine Single Knee to Chest Stretch - 1 x daily - 7 x weekly - 3 sets - 3 reps - 30 sec hold Supine Double Knee to Chest - 1 x daily - 7 x weekly - 3 sets - 3 reps - 30 sec hold

## 2021-02-13 ENCOUNTER — Other Ambulatory Visit: Payer: Self-pay

## 2021-02-13 ENCOUNTER — Ambulatory Visit (HOSPITAL_COMMUNITY): Payer: Medicare HMO

## 2021-02-13 ENCOUNTER — Ambulatory Visit (HOSPITAL_COMMUNITY): Payer: Medicare HMO | Admitting: Physical Therapy

## 2021-02-13 ENCOUNTER — Encounter (HOSPITAL_COMMUNITY): Payer: Self-pay

## 2021-02-13 ENCOUNTER — Encounter (HOSPITAL_COMMUNITY): Payer: Self-pay | Admitting: Physical Therapy

## 2021-02-13 DIAGNOSIS — M545 Low back pain, unspecified: Secondary | ICD-10-CM | POA: Diagnosis not present

## 2021-02-13 DIAGNOSIS — M25512 Pain in left shoulder: Secondary | ICD-10-CM | POA: Diagnosis not present

## 2021-02-13 DIAGNOSIS — R29898 Other symptoms and signs involving the musculoskeletal system: Secondary | ICD-10-CM | POA: Diagnosis not present

## 2021-02-13 DIAGNOSIS — M25511 Pain in right shoulder: Secondary | ICD-10-CM | POA: Diagnosis not present

## 2021-02-13 DIAGNOSIS — G8929 Other chronic pain: Secondary | ICD-10-CM | POA: Diagnosis not present

## 2021-02-13 DIAGNOSIS — R262 Difficulty in walking, not elsewhere classified: Secondary | ICD-10-CM | POA: Diagnosis not present

## 2021-02-13 DIAGNOSIS — M6281 Muscle weakness (generalized): Secondary | ICD-10-CM | POA: Diagnosis not present

## 2021-02-13 NOTE — Therapy (Signed)
[place], [place], [address] Phone: [phone]   Fax:  [phone]  Physical Therapy Treatment  Patient Details  Name: Jennifer Jacobs MRN: [id number] Date of Birth: May 27, 1946 Referring Provider (PT): Carole Civil, MD   Encounter Date: 02/13/2021   PT End of Session - 02/13/21 1358     Visit Number 5    Number of Visits 12    Date for PT Re-Evaluation 03/17/21    Authorization Type [employer] HMO, auth required, visit limit MCR guidelines    PT Start Time 1400    PT Stop Time 1438    PT Time Calculation (min) 38 min    Activity Tolerance Patient limited by fatigue;Patient limited by pain    Behavior During Therapy [facility] for tasks assessed/performed             Past Medical History:  Diagnosis Date   Acute blood loss anemia    AKI (acute kidney injury) ([place]) 01/11/2018   Alcohol abuse 07/05/2014   Allergy    Anxiety    Anxiety and depression    Arthritis    Cognitive deficit due to recent stroke 11/22/2016   COPD (chronic obstructive pulmonary disease) ([place])    Depression    Dyslipidemia    Fibromyalgia    Hepatic cyst    Hyperlipidemia    Hypertension    Leukocytosis    Liver cyst 03/18/2014   01/26/2014 ultrasound Hypoechoic structure adjacent to gallbladder right lobe liver,  measuring 29 x 18 x 17 mm, possibly septated cyst     MI (myocardial infarction) ([place]) 2007   By patient report, not substantiated by recent nuclear stress test.   Neuromuscular disorder ([place])    Seizure disorder ([place])    none since 2002   Seizures ([place])    Smoker unmotivated to quit    Quit for 3 years and then restarted   Substance abuse ([place])    ETOH   Vision abnormalities     Past Surgical History:  Procedure Laterality Date   CATARACT EXTRACTION W/PHACO Right 06/30/2018   Procedure: CATARACT EXTRACTION PHACO AND INTRAOCULAR LENS PLACEMENT RIGHT EYE;  Surgeon: Tonny Branch, MD;  Location: [facility];   Service: Ophthalmology;  Laterality: Right;  CDE: 9.90   CATARACT EXTRACTION W/PHACO Left 07/14/2018   Procedure: CATARACT EXTRACTION PHACO AND INTRAOCULAR LENS PLACEMENT (IOC);  Surgeon: Tonny Branch, MD;  Location: [facility];  Service: Ophthalmology;  Laterality: Left;  CDE: 6.96   CRANIOTOMY Left 11/19/2016   Procedure: CRANIOTOMY INTRACRANIAL FOR  ANEURYSM CLIPPING;  Surgeon: Kevan Ny Ditty, MD;  Location: [place];  Service: Neurosurgery;  Laterality: Left;   ELBOW ARTHROPLASTY Left    rods   Exercise tolerance test  04/27/2013   CPET-MET: Submaximal effort (0.96, with goal of greater than 1.09) -- patient states that her effort was limited due to knee pain.  This makes the rest of the interpretation difficult: Peak VO2 - 10.5 (59% moderate), peak 02-pulse -  7..11 (low); heart rate 114 (73% -chronic incompetence considered);; PFTs normal    MOUTH SURGERY     NM MYOVIEW LTD  05/19/2013   LexiScan: EF 80%, no evidence of ischemia or infarction   TRANSTHORACIC ECHOCARDIOGRAM  03/25/2013   EF 55-60%, no regional wall motion abnormalities; normal diastolic parameters, normal pulmonary pressures.  No valvular lesions noted.  Essentially normal echo     There were no vitals filed for this visit.   Subjective Assessment -  02/13/21 1359     Subjective Patient states her knees are hurting today. She thinks she might have overdid her exercises.    How long can you stand comfortably? 10 min    How long can you walk comfortably? 5 min    Diagnostic tests x-ray Facet arthritis L4 and L5 spondylolisthesis grade 1 spondylosis L4-S1    Patient Stated Goals "Get my back to stop hurting and not fall down so much"    Currently in Pain? Yes    Pain Score 9     Pain Location Knee    Pain Orientation Right;Left    Pain Descriptors / Indicators Aching    Pain Type Chronic pain    Pain Onset More than a month ago    Pain Frequency Constant                               [facility] Adult PT  Treatment/Exercise - 02/13/21 0001       Lumbar Exercises: Aerobic   Recumbent Bike 2 minutes level 2      Knee/Hip Exercises: Standing   Heel Raises Both;1 set;20 reps    Heel Raises Limitations 1x20    Knee Flexion Strengthening;Both;2 sets;10 reps    Knee Flexion Limitations 2#    Hip Flexion Stengthening;Both;2 sets;10 reps    Hip Flexion Limitations 2#    Hip Abduction Stengthening;Both;2 sets;10 reps    Abduction Limitations 2#      Knee/Hip Exercises: Seated   Long Arc Quad Strengthening;Both;2 sets;10 reps    Long Arc Quad Weight 2 lbs.    Ball Squeeze 2x10    Other Seated Knee/Hip Exercises hip abduction isometric 10x 10 second holds                    PT Education - 02/13/21 1359     Education Details HEP    Person(s) Educated Patient    Methods Explanation;Demonstration    Comprehension Verbalized understanding;Returned demonstration              PT Short Term Goals - 02/03/21 1540       PT SHORT TERM GOAL #1   Title Patient will be independent with HEP in order to improve functional outcomes.    Time 3    Period Weeks    Status New    Target Date 02/24/21      PT SHORT TERM GOAL #2   Title Patient will report at least 25% improvement in symptoms for improved quality of life.    Baseline 8/10 back pain, self report of 10 min standing tolerance    Time 3    Period Weeks    Status New    Target Date 02/24/21      PT SHORT TERM GOAL #3   Title Demonstrate improved gait speed and tolerance as evidenced by distanc of 80 ft during 2MWT    Baseline 40 ft with RW    Time 3    Period Weeks    Status New    Target Date 02/24/21               PT Long Term Goals - 02/03/21 1541       PT LONG TERM GOAL #1   Title Patient will improve FOTO score by at least 10 points in order to indicate improved tolerance to activity    Baseline 39% function    Time 6  Period Weeks    Status New    Target Date 03/17/21      PT LONG TERM GOAL  #2   Title Demo gross 4/5 BLE strength to improve activity tolerance    Baseline 2+ to 3/5 BLE    Time 6    Period Weeks    Status New    Target Date 03/17/21      PT LONG TERM GOAL #3   Title Patient will report at least 50% improvement in symptoms for improved quality of life.    Baseline 8/10 back pain, self report of 10 min standing tolerance    Time 6    Period Weeks    Status New    Target Date 03/17/21                   Plan - 02/13/21 1359     Clinical Impression Statement Patient ambulating with improving steadiness today compared to prior sessions. Began session on bike for improving endurance and dynamic warm up but patient limited secondary to c/o bilateral knee pain. Patient requires several rest breaks during session due to fatigue. Patient very fatigued at end of session. Patient will continue to benefit from skilled physical therapy in order to reduce impairment and improve function.    Personal Factors and Comorbidities Age;Comorbidity 3+;Time since onset of injury/illness/exacerbation    Comorbidities multiple comorbid conditions    Examination-Activity Limitations Bend;Carry;Lift;Stand;Stairs;Squat;Locomotion Level;Transfers    Examination-Participation Restrictions Church;Community Activity;Laundry;Meal Prep    Stability/Clinical Decision Making Evolving/Moderate complexity    Rehab Potential Good    PT Frequency 2x / week    PT Duration 6 weeks    PT Treatment/Interventions ADLs/Self Care Home Management;Electrical Stimulation;DME Instruction;Traction;Gait training;Stair training;Functional mobility training;Therapeutic activities;Therapeutic exercise;Balance training;Patient/family education;Neuromuscular re-education;Manual techniques;Passive range of motion;Spinal Manipulations;Joint Manipulations;Taping    PT Next Visit Plan Continue with BLE strengthening, core stabilization    PT Home Exercise Plan QS, hip add isometric, SLR 6/22 HR, hip abd standing  at sink. PPT, SKTC, DKTC    Consulted and Agree with Plan of Care Patient             Patient will benefit from skilled therapeutic intervention in order to improve the following deficits and impairments:  Abnormal gait, Decreased activity tolerance, Decreased balance, Decreased mobility, Decreased endurance, Decreased strength, Difficulty walking, Postural dysfunction, Improper body mechanics, Pain  Visit Diagnosis: Chronic midline low back pain, unspecified whether sciatica present  Muscle weakness (generalized)  Difficulty in walking, not elsewhere classified     Problem List Patient Active Problem List   Diagnosis Date Noted   Acute pulmonary embolism ([place]) 01/13/2018   Chest pain 01/11/2018   Elevated troponin 01/11/2018   AKI (acute kidney injury) ([place]) 01/11/2018   NSTEMI (non-ST elevated myocardial infarction) ([place]) 01/11/2018   Vascular headache    Seizure prophylaxis    Weakness of left third cranial nerve    Gait disturbance, post-stroke 11/22/2016   Benign essential HTN    Seizure disorder ([place])    Acute blood loss anemia    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   Bilateral leg pain 06/26/2016   AAA (abdominal aortic aneurysm) without rupture ([place]) 06/26/2016   Insomnia 06/07/2015   Urinary incontinence 06/07/2015   Chronic pain syndrome 03/04/2015   Fibromyalgia 03/04/2015   Cervical radiculitis 03/04/2015   Lumbar radicular pain 03/04/2015   Major depression 07/05/2014   Claudication, class I ([place]) 03/18/2013   Smoker unmotivated to quit    2:41 PM,  02/13/21 Mearl Latin PT, DPT Physical Therapist at [place], [place], [address] Phone: [phone]   Fax:  [phone]  Name: HEAVENLEE MAIORANA MRN: [id number] Date of Birth: 05-10-1946

## 2021-02-15 ENCOUNTER — Ambulatory Visit (HOSPITAL_COMMUNITY): Payer: Medicare HMO

## 2021-02-15 ENCOUNTER — Telehealth (HOSPITAL_COMMUNITY): Payer: Self-pay

## 2021-02-15 NOTE — Telephone Encounter (Signed)
She called to cx and she said she hurting really bad she my go to the hospital. She will be here Friday if she is better.

## 2021-02-17 ENCOUNTER — Encounter (HOSPITAL_COMMUNITY): Payer: Self-pay | Admitting: Occupational Therapy

## 2021-02-17 ENCOUNTER — Other Ambulatory Visit: Payer: Self-pay

## 2021-02-17 ENCOUNTER — Ambulatory Visit (HOSPITAL_COMMUNITY): Payer: Medicare HMO | Attending: Orthopedic Surgery

## 2021-02-17 ENCOUNTER — Encounter (HOSPITAL_COMMUNITY): Payer: Self-pay

## 2021-02-17 ENCOUNTER — Ambulatory Visit (HOSPITAL_COMMUNITY): Payer: Medicare HMO | Admitting: Occupational Therapy

## 2021-02-17 DIAGNOSIS — M545 Low back pain, unspecified: Secondary | ICD-10-CM | POA: Diagnosis not present

## 2021-02-17 DIAGNOSIS — R262 Difficulty in walking, not elsewhere classified: Secondary | ICD-10-CM | POA: Insufficient documentation

## 2021-02-17 DIAGNOSIS — M25511 Pain in right shoulder: Secondary | ICD-10-CM

## 2021-02-17 DIAGNOSIS — G8929 Other chronic pain: Secondary | ICD-10-CM

## 2021-02-17 DIAGNOSIS — M25512 Pain in left shoulder: Secondary | ICD-10-CM | POA: Diagnosis not present

## 2021-02-17 DIAGNOSIS — M6281 Muscle weakness (generalized): Secondary | ICD-10-CM | POA: Insufficient documentation

## 2021-02-17 DIAGNOSIS — R29898 Other symptoms and signs involving the musculoskeletal system: Secondary | ICD-10-CM

## 2021-02-17 NOTE — Therapy (Signed)
[place], [place], [address] Phone: [phone]   Fax:  [phone]  Physical Therapy Treatment  Patient Details  Name: Jennifer Jacobs MRN: [id number] Date of Birth: [birth year] Referring Provider (PT): Carole Civil, MD   Encounter Date: 02/17/2021   PT End of Session - 02/17/21 1357     Visit Number 6    Number of Visits 12    Date for PT Re-Evaluation 03/17/21    Authorization Type [employer] HMO, auth required, visit limit MCR guidelines    PT Start Time 2094    PT Stop Time 1430    PT Time Calculation (min) 45 min    Activity Tolerance Patient limited by fatigue;Patient limited by pain    Behavior During Therapy [facility] for tasks assessed/performed             Past Medical History:  Diagnosis Date   Acute blood loss anemia    AKI (acute kidney injury) ([place]) 01/11/2018   Alcohol abuse 07/05/2014   Allergy    Anxiety    Anxiety and depression    Arthritis    Cognitive deficit due to recent stroke 11/22/2016   COPD (chronic obstructive pulmonary disease) ([place])    Depression    Dyslipidemia    Fibromyalgia    Hepatic cyst    Hyperlipidemia    Hypertension    Leukocytosis    Liver cyst 03/18/2014   01/26/2014 ultrasound Hypoechoic structure adjacent to gallbladder right lobe liver,  measuring 29 x 18 x 17 mm, possibly septated cyst     MI (myocardial infarction) ([place]) 2007   By patient report, not substantiated by recent nuclear stress test.   Neuromuscular disorder ([place])    Seizure disorder ([place])    none since 2002   Seizures ([place])    Smoker unmotivated to quit    Quit for 3 years and then restarted   Substance abuse ([place])    ETOH   Vision abnormalities     Past Surgical History:  Procedure Laterality Date   CATARACT EXTRACTION W/PHACO Right 06/30/2018   Procedure: CATARACT EXTRACTION PHACO AND INTRAOCULAR LENS PLACEMENT RIGHT EYE;  Surgeon: Tonny Branch, MD;  Location: [facility];   Service: Ophthalmology;  Laterality: Right;  CDE: 9.90   CATARACT EXTRACTION W/PHACO Left 07/14/2018   Procedure: CATARACT EXTRACTION PHACO AND INTRAOCULAR LENS PLACEMENT (IOC);  Surgeon: Tonny Branch, MD;  Location: [facility];  Service: Ophthalmology;  Laterality: Left;  CDE: 6.96   CRANIOTOMY Left 11/19/2016   Procedure: CRANIOTOMY INTRACRANIAL FOR  ANEURYSM CLIPPING;  Surgeon: Kevan Ny Ditty, MD;  Location: [place];  Service: Neurosurgery;  Laterality: Left;   ELBOW ARTHROPLASTY Left    rods   Exercise tolerance test  04/27/2013   CPET-MET: Submaximal effort (0.96, with goal of greater than 1.09) -- patient states that her effort was limited due to knee pain.  This makes the rest of the interpretation difficult: Peak VO2 - 10.5 (59% moderate), peak 02-pulse -  7..11 (low); heart rate 114 (73% -chronic incompetence considered);; PFTs normal    MOUTH SURGERY     NM MYOVIEW LTD  05/19/2013   LexiScan: EF 80%, no evidence of ischemia or infarction   TRANSTHORACIC ECHOCARDIOGRAM  03/25/2013   EF 55-60%, no regional wall motion abnormalities; normal diastolic parameters, normal pulmonary pressures.  No valvular lesions noted.  Essentially normal echo     There were no vitals filed for this visit.   Subjective Assessment -  02/17/21 1356     Subjective Pt notes general fatigue and low energy    Diagnostic tests x-ray Facet arthritis L4 and L5 spondylolisthesis grade 1 spondylosis L4-S1    Patient Stated Goals "Get my back to stop hurting and not fall down so much"    Currently in Pain? Yes    Pain Score 5     Pain Location Back    Pain Orientation Right;Left    Pain Descriptors / Indicators Aching    Pain Type Chronic pain                               [facility] Adult PT Treatment/Exercise - 02/17/21 1358       Lumbar Exercises: Stretches   Single Knee to Chest Stretch Right;Left;2 reps;30 seconds    Double Knee to Chest Stretch 2 reps;30 seconds      Lumbar Exercises:  Aerobic   Recumbent Bike 5 min level 2 for dynamic warm-up      Lumbar Exercises: Seated   Long Arc Quad on Chair Strengthening;Both;2 sets;10 reps    LAQ on Chair Weights (lbs) 3      Lumbar Exercises: Supine   Ab Set 20 reps;2 seconds      Knee/Hip Exercises: Standing   Heel Raises Both;1 set;20 reps    Knee Flexion Strengthening;Both;2 sets;10 reps    Knee Flexion Limitations 3#    Hip Flexion Stengthening;Both;2 sets;10 reps    Hip Flexion Limitations 3#    Hip Abduction Stengthening;Both;2 sets;10 reps    Abduction Limitations 3#    Other Standing Knee Exercises sidestepping x 2 min 3#                    PT Education - 02/17/21 1423     Education Details discussion regarding Silver Sneakers program to improve activity participation    Person(s) Educated Patient    Methods Explanation    Comprehension Verbalized understanding              PT Short Term Goals - 02/03/21 1540       PT SHORT TERM GOAL #1   Title Patient will be independent with HEP in order to improve functional outcomes.    Time 3    Period Weeks    Status New    Target Date 02/24/21      PT SHORT TERM GOAL #2   Title Patient will report at least 25% improvement in symptoms for improved quality of life.    Baseline 8/10 back pain, self report of 10 min standing tolerance    Time 3    Period Weeks    Status New    Target Date 02/24/21      PT SHORT TERM GOAL #3   Title Demonstrate improved gait speed and tolerance as evidenced by distanc of 80 ft during 2MWT    Baseline 40 ft with RW    Time 3    Period Weeks    Status New    Target Date 02/24/21               PT Long Term Goals - 02/03/21 1541       PT LONG TERM GOAL #1   Title Patient will improve FOTO score by at least 10 points in order to indicate improved tolerance to activity    Baseline 39% function    Time 6    Period Weeks  Status New    Target Date 03/17/21      PT LONG TERM GOAL #2   Title Demo  gross 4/5 BLE strength to improve activity tolerance    Baseline 2+ to 3/5 BLE    Time 6    Period Weeks    Status New    Target Date 03/17/21      PT LONG TERM GOAL #3   Title Patient will report at least 50% improvement in symptoms for improved quality of life.    Baseline 8/10 back pain, self report of 10 min standing tolerance    Time 6    Period Weeks    Status New    Target Date 03/17/21                   Plan - 02/17/21 1420     Clinical Impression Statement Pt tolerating tx sessions well albeit with onset of fatigue but able to increase resistance without adverse effects.  Continued POC indicated to improve trunk and BLE strength and facilitate greater functional activity tolerance and balance to reduce risk fro falls    Personal Factors and Comorbidities Age;Comorbidity 3+;Time since onset of injury/illness/exacerbation    Comorbidities multiple comorbid conditions    Examination-Activity Limitations Bend;Carry;Lift;Stand;Stairs;Squat;Locomotion Level;Transfers    Examination-Participation Restrictions Church;Community Activity;Laundry;Meal Prep    Stability/Clinical Decision Making Evolving/Moderate complexity    Rehab Potential Good    PT Frequency 2x / week    PT Duration 6 weeks    PT Treatment/Interventions ADLs/Self Care Home Management;Electrical Stimulation;DME Instruction;Traction;Gait training;Stair training;Functional mobility training;Therapeutic activities;Therapeutic exercise;Balance training;Patient/family education;Neuromuscular re-education;Manual techniques;Passive range of motion;Spinal Manipulations;Joint Manipulations;Taping    PT Next Visit Plan Continue with BLE strengthening, core stabilization    PT Home Exercise Plan QS, hip add isometric, SLR 6/22 HR, hip abd standing at sink. PPT, SKTC, DKTC    Consulted and Agree with Plan of Care Patient             Patient will benefit from skilled therapeutic intervention in order to improve the  following deficits and impairments:  Abnormal gait, Decreased activity tolerance, Decreased balance, Decreased mobility, Decreased endurance, Decreased strength, Difficulty walking, Postural dysfunction, Improper body mechanics, Pain  Visit Diagnosis: Chronic midline low back pain, unspecified whether sciatica present  Muscle weakness (generalized)  Difficulty in walking, not elsewhere classified     Problem List Patient Active Problem List   Diagnosis Date Noted   Acute pulmonary embolism ([place]) 01/13/2018   Chest pain 01/11/2018   Elevated troponin 01/11/2018   AKI (acute kidney injury) ([place]) 01/11/2018   NSTEMI (non-ST elevated myocardial infarction) ([place]) 01/11/2018   Vascular headache    Seizure prophylaxis    Weakness of left third cranial nerve    Gait disturbance, post-stroke 11/22/2016   Benign essential HTN    Seizure disorder ([place])    Acute blood loss anemia    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   Bilateral leg pain 06/26/2016   AAA (abdominal aortic aneurysm) without rupture ([place]) 06/26/2016   Insomnia 06/07/2015   Urinary incontinence 06/07/2015   Chronic pain syndrome 03/04/2015   Fibromyalgia 03/04/2015   Cervical radiculitis 03/04/2015   Lumbar radicular pain 03/04/2015   Major depression 07/05/2014   Claudication, class I ([place]) 03/18/2013   Smoker unmotivated to quit    2:24 PM, 02/17/21 M. Sherlyn Lees, PT, DPT Physical Therapist- [place] Office Number: [phone]   [place] [address] [place], [place], [address]  Phone: [phone]   Fax:  [phone]  Name: Jennifer Jacobs MRN: [id number] Date of Birth: 1946-02-28

## 2021-02-17 NOTE — Therapy (Signed)
[facility] [address] [place], [place], [address] Phone: [phone]   Fax:  [phone]  Occupational Therapy Treatment  Patient Details  Name: Jennifer Jacobs MRN: [id number] Date of Birth: [birth year] Referring Provider (OT): Dr. Arther Abbott   Encounter Date: 02/17/2021   OT End of Session - 02/17/21 1150     Visit Number 2    Number of Visits 8    Date for OT Re-Evaluation 03/05/21    Authorization Type 1) [employer] 2) Medicaid    Authorization Time Period 8 visits approved 6/17-7/16/22    Authorization - Visit Number 1    Authorization - Number of Visits 8    Progress Note Due on Visit 10    OT Start Time 1115    OT Stop Time 1158    OT Time Calculation (min) 43 min    Activity Tolerance Patient tolerated treatment well    Behavior During Therapy [facility] for tasks assessed/performed             Past Medical History:  Diagnosis Date   Acute blood loss anemia    AKI (acute kidney injury) ([place]) 01/11/2018   Alcohol abuse 07/05/2014   Allergy    Anxiety    Anxiety and depression    Arthritis    Cognitive deficit due to recent stroke 11/22/2016   COPD (chronic obstructive pulmonary disease) ([place])    Depression    Dyslipidemia    Fibromyalgia    Hepatic cyst    Hyperlipidemia    Hypertension    Leukocytosis    Liver cyst 03/18/2014   01/26/2014 ultrasound Hypoechoic structure adjacent to gallbladder right lobe liver,  measuring 29 x 18 x 17 mm, possibly septated cyst     MI (myocardial infarction) ([place]) 2007   By patient report, not substantiated by recent nuclear stress test.   Neuromuscular disorder ([place])    Seizure disorder ([place])    none since 2002   Seizures ([place])    Smoker unmotivated to quit    Quit for 3 years and then restarted   Substance abuse ([place])    ETOH   Vision abnormalities     Past Surgical History:  Procedure Laterality Date   CATARACT EXTRACTION W/PHACO Right 06/30/2018    Procedure: CATARACT EXTRACTION PHACO AND INTRAOCULAR LENS PLACEMENT RIGHT EYE;  Surgeon: Tonny Branch, MD;  Location: [facility];  Service: Ophthalmology;  Laterality: Right;  CDE: 9.90   CATARACT EXTRACTION W/PHACO Left 07/14/2018   Procedure: CATARACT EXTRACTION PHACO AND INTRAOCULAR LENS PLACEMENT (IOC);  Surgeon: Tonny Branch, MD;  Location: [facility];  Service: Ophthalmology;  Laterality: Left;  CDE: 6.96   CRANIOTOMY Left 11/19/2016   Procedure: CRANIOTOMY INTRACRANIAL FOR  ANEURYSM CLIPPING;  Surgeon: Kevan Ny Ditty, MD;  Location: [facility];  Service: Neurosurgery;  Laterality: Left;   ELBOW ARTHROPLASTY Left    rods   Exercise tolerance test  04/27/2013   CPET-MET: Submaximal effort (0.96, with goal of greater than 1.09) -- patient states that her effort was limited due to knee pain.  This makes the rest of the interpretation difficult: Peak VO2 - 10.5 (59% moderate), peak 02-pulse -  7..11 (low); heart rate 114 (73% -chronic incompetence considered);; PFTs normal    MOUTH SURGERY     NM MYOVIEW LTD  05/19/2013   LexiScan: EF 80%, no evidence of ischemia or infarction   TRANSTHORACIC ECHOCARDIOGRAM  03/25/2013   EF 55-60%, no regional wall motion abnormalities; normal diastolic parameters,  normal pulmonary pressures.  No valvular lesions noted.  Essentially normal echo     There were no vitals filed for this visit.   Subjective Assessment - 02/17/21 1114     Subjective  S: Everything is hurting today.    Currently in Pain? Yes    Pain Score 4     Pain Location Shoulder    Pain Orientation Right;Left    Pain Descriptors / Indicators Aching;Sore    Pain Type Chronic pain    Pain Radiating Towards neck    Pain Onset More than a month ago    Pain Frequency Constant    Aggravating Factors  unsure    Pain Relieving Factors pain medication    Effect of Pain on Daily Activities mod effect on ADLs    Multiple Pain Sites No                [facility] OT Assessment - 02/17/21 1114        Assessment   Medical Diagnosis chronic bilateral shoulder pain      Precautions   Precautions None                      OT Treatments/Exercises (OP) - 02/17/21 1116       Exercises   Exercises Shoulder      Shoulder Exercises: Supine   Protraction PROM;5 reps;AROM;10 reps;Both    Horizontal ABduction PROM;5 reps;AROM;10 reps;Both    External Rotation PROM;5 reps;AROM;10 reps;Both    Internal Rotation PROM;5 reps;AROM;10 reps;Both    Flexion PROM;5 reps;AROM;10 reps;Both    ABduction PROM;5 reps;Both      Shoulder Exercises: Seated   Protraction AROM;10 reps;Both    Horizontal ABduction AROM;10 reps;Both    External Rotation AROM;10 reps;Both    Internal Rotation AROM;10 reps;Both    Flexion AROM;10 reps;Both    Abduction AROM;10 reps;Both      Shoulder Exercises: ROM/Strengthening   UBE (Upper Arm Bike) level 1 2'forward 2' reverse, pace: 5.0    Wall Wash 1' each UE    Thumb Tacks 1' BUE    Proximal Shoulder Strengthening, Supine --      Manual Therapy   Manual Therapy Myofascial release    Manual therapy comments completed separately from therapeutic exercises    Myofascial Release myofascial release to bilateral trapezius and scapular regions to decrease pain and fascial restrictions and increase joint ROM                      OT Short Term Goals - 02/17/21 1117       OT SHORT TERM GOAL #1   Title Pt will be provided with and educated on HEP to improve mobility required for ADL completion.    Time 4    Period Weeks    Status On-going    Target Date 03/05/21      OT SHORT TERM GOAL #2   Title Pt will decrease BUE pain to 3/10 or less to improve ability to sleep for 3+ hours without waking due to pain.    Time 4    Period Weeks    Status On-going      OT SHORT TERM GOAL #3   Title Pt will decrease fascial restrictions in BUE to minimal amounts to improve mobility required for functional reaching tasks.    Time 4    Period Weeks     Status On-going      OT SHORT TERM GOAL #4  Title Pt will increase BUE A/ROM to WNL to improve mobility required for dressing and bathing tasks.    Time 4    Period Weeks    Status On-going                      Plan - 02/17/21 1134     Clinical Impression Statement A: Pt reports she has been completing HEP given at evaluation and it is going well and helping. Initiated myofascial release to bilateral UEs to address fascial restrictions. Completed passive stretching and A/ROM in supine, pt with ROM WNL, unable to complete abduction due to poor form. . Also completing A/ROM in sitting, pt able to complete abduction with improved form. Pt completing wall wash and thumb tacks, UBE. Verbal and visual cuing for form and technique.    Body Structure / Function / Physical Skills ADL;Endurance;UE functional use;Fascial restriction;Pain;ROM;Strength;IADL    Plan A: Continue with A/ROM and update HEP, add proximal shoulder strengthening, scapular theraband    OT Home Exercise Plan eval: scapular A/ROM, shoulder AA/ROM    Consulted and Agree with Plan of Care Patient             Patient will benefit from skilled therapeutic intervention in order to improve the following deficits and impairments:   Body Structure / Function / Physical Skills: ADL, Endurance, UE functional use, Fascial restriction, Pain, ROM, Strength, IADL       Visit Diagnosis: Chronic left shoulder pain  Chronic right shoulder pain  Other symptoms and signs involving the musculoskeletal system    Problem List Patient Active Problem List   Diagnosis Date Noted   Acute pulmonary embolism ([place]) 01/13/2018   Chest pain 01/11/2018   Elevated troponin 01/11/2018   AKI (acute kidney injury) ([place]) 01/11/2018   NSTEMI (non-ST elevated myocardial infarction) ([place]) 01/11/2018   Vascular headache    Seizure prophylaxis    Weakness of left third cranial nerve    Gait disturbance, post-stroke 11/22/2016    Benign essential HTN    Seizure disorder ([place])    Acute blood loss anemia    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   Bilateral leg pain 06/26/2016   AAA (abdominal aortic aneurysm) without rupture ([place]) 06/26/2016   Insomnia 06/07/2015   Urinary incontinence 06/07/2015   Chronic pain syndrome 03/04/2015   Fibromyalgia 03/04/2015   Cervical radiculitis 03/04/2015   Lumbar radicular pain 03/04/2015   Major depression 07/05/2014   Claudication, class I ([place]) 03/18/2013   Smoker unmotivated to quit     Guadelupe Sabin, OTR/L  [phone] 02/17/2021, 11:59 AM  [facility] [place], [place], [address] Phone: [phone]   Fax:  [phone]  Name: Jennifer Jacobs MRN: [id number] Date of Birth: 07/06/1946

## 2021-02-21 ENCOUNTER — Telehealth (HOSPITAL_COMMUNITY): Payer: Self-pay | Admitting: Physical Therapy

## 2021-02-21 ENCOUNTER — Ambulatory Visit (HOSPITAL_COMMUNITY): Payer: Medicare HMO | Admitting: Physical Therapy

## 2021-02-21 NOTE — Telephone Encounter (Signed)
Pt did not show for appt.  Called and spoke to patient who states she is still out of town but should be coming home this afternoon.  Reminded of appt for tomorrow at 1:45  Lurena Nida, PTA/CLT [phone]

## 2021-02-22 ENCOUNTER — Ambulatory Visit (HOSPITAL_COMMUNITY): Payer: Medicare HMO

## 2021-02-22 ENCOUNTER — Encounter (HOSPITAL_COMMUNITY): Payer: Self-pay

## 2021-02-22 ENCOUNTER — Other Ambulatory Visit: Payer: Self-pay

## 2021-02-22 DIAGNOSIS — G8929 Other chronic pain: Secondary | ICD-10-CM

## 2021-02-22 DIAGNOSIS — M545 Low back pain, unspecified: Secondary | ICD-10-CM | POA: Diagnosis not present

## 2021-02-22 DIAGNOSIS — R29898 Other symptoms and signs involving the musculoskeletal system: Secondary | ICD-10-CM | POA: Diagnosis not present

## 2021-02-22 DIAGNOSIS — M6281 Muscle weakness (generalized): Secondary | ICD-10-CM | POA: Diagnosis not present

## 2021-02-22 DIAGNOSIS — M25511 Pain in right shoulder: Secondary | ICD-10-CM

## 2021-02-22 DIAGNOSIS — R262 Difficulty in walking, not elsewhere classified: Secondary | ICD-10-CM

## 2021-02-22 DIAGNOSIS — M25512 Pain in left shoulder: Secondary | ICD-10-CM | POA: Diagnosis not present

## 2021-02-22 NOTE — Patient Instructions (Signed)
Repeat all exercises 10-15 times, 1 time per day.  1) Shoulder Protraction    Begin with elbows by your side, slowly "punch" straight out in front of you.      2) Shoulder Flexion   Standing:         Begin with arms at your side with thumbs pointed up, slowly raise both arms up and forward towards overhead.       3) Horizontal abduction/adduction    Standing:           Begin with arms straight out in front of you, bring out to the side in at "T" shape. Keep arms straight entire time.          4) Internal & External Rotation    Standing:     Stand with elbows at the side and elbows bent 90 degrees. Move your forearms away from your body, then bring back inward toward the body.     5) Shoulder Abduction   Standing:      Begin with your arms  next to your side. Slowly move your arms out to the side so that they go overhead, in a jumping jack or snow angel movement.

## 2021-02-22 NOTE — Therapy (Signed)
[facility] [place], [place], [address] Phone: [phone]   Fax:  [phone]  Physical Therapy Treatment  Patient Details  Name: Jennifer Jacobs MRN: [id number] Date of Birth: 1946-07-24 Referring Provider (PT): Carole Civil, MD   Encounter Date: 02/22/2021   PT End of Session - 02/22/21 1519     Visit Number 7    Number of Visits 12    Date for PT Re-Evaluation 03/17/21    Authorization Type [employer] Medicare HMO, auth required, visit limit MCR guidelines    PT Start Time 1518    PT Stop Time 1600    PT Time Calculation (min) 42 min    Activity Tolerance Patient limited by fatigue;Patient limited by pain    Behavior During Therapy [facility] for tasks assessed/performed             Past Medical History:  Diagnosis Date   Acute blood loss anemia    AKI (acute kidney injury) ([place]) 01/11/2018   Alcohol abuse 07/05/2014   Allergy    Anxiety    Anxiety and depression    Arthritis    Cognitive deficit due to recent stroke 11/22/2016   COPD (chronic obstructive pulmonary disease) ([place])    Depression    Dyslipidemia    Fibromyalgia    Hepatic cyst    Hyperlipidemia    Hypertension    Leukocytosis    Liver cyst 03/18/2014   01/26/2014 ultrasound Hypoechoic structure adjacent to gallbladder right lobe liver,  measuring 29 x 18 x 17 mm, possibly septated cyst     MI (myocardial infarction) ([place]) 2007   By patient report, not substantiated by recent nuclear stress test.   Neuromuscular disorder ([place])    Seizure disorder ([place])    none since 2002   Seizures ([place])    Smoker unmotivated to quit    Quit for 3 years and then restarted   Substance abuse ([place])    ETOH   Vision abnormalities     Past Surgical History:  Procedure Laterality Date   CATARACT EXTRACTION W/PHACO Right 06/30/2018   Procedure: CATARACT EXTRACTION PHACO AND INTRAOCULAR LENS PLACEMENT RIGHT EYE;  Surgeon: Tonny Branch, MD;  [facility];   Service: Ophthalmology;  Laterality: Right;  CDE: 9.90   CATARACT EXTRACTION W/PHACO Left 07/14/2018   Procedure: CATARACT EXTRACTION PHACO AND INTRAOCULAR LENS PLACEMENT (IOC);  Surgeon: Tonny Branch, MD;  Location[facility];  Service: Ophthalmology;  Laterality: Left;  CDE: 6.96   CRANIOTOMY Left 11/19/2016   Procedure: CRANIOTOMY INTRACRANIAL FOR  ANEURYSM CLIPPING;  Surgeon: Kevan Ny Ditty, MD;  Location: [facility];  Service: Neurosurgery;  Laterality: Left;   ELBOW ARTHROPLASTY Left    rods   Exercise tolerance test  04/27/2013   CPET-MET: Submaximal effort (0.96, with goal of greater than 1.09) -- patient states that her effort was limited due to knee pain.  This makes the rest of the interpretation difficult: Peak VO2 - 10.5 (59% moderate), peak 02-pulse -  7..11 (low); heart rate 114 (73% -chronic incompetence considered);; PFTs normal    MOUTH SURGERY     NM MYOVIEW LTD  05/19/2013   LexiScan: EF 80%, no evidence of ischemia or infarction   TRANSTHORACIC ECHOCARDIOGRAM  03/25/2013   EF 55-60%, no regional wall motion abnormalities; normal diastolic parameters, normal pulmonary pressures.  No valvular lesions noted.  Essentially normal echo     There were no vitals filed for this visit.   Subjective Assessment -  02/22/21 1522     Subjective Pt reports some improvement in LBP. Noted episode of LLE pain when walking long distance and going up/down stairs    Diagnostic tests x-ray Facet arthritis L4 and L5 spondylolisthesis grade 1 spondylosis L4-S1    Patient Stated Goals "Get my back to stop hurting and not fall down so much"    Currently in Pain? Yes    Pain Score 4     Pain Location Back    Pain Orientation Right;Left;Lower    Pain Descriptors / Indicators Aching    Pain Type Chronic pain                [facility] PT Assessment - 02/22/21 0001       Assessment   Medical Diagnosis LBP                           [facility] Adult PT Treatment/Exercise - 02/22/21  0001       Lumbar Exercises: Stretches   Single Knee to Chest Stretch Right;Left;2 reps;30 seconds    Double Knee to Chest Stretch 2 reps;30 seconds      Lumbar Exercises: Aerobic   Recumbent Bike 5 min level 2 for dynamic warm-up      Lumbar Exercises: Seated   Sit to Stand 20 reps      Lumbar Exercises: Supine   Ab Set 20 reps;2 seconds      Knee/Hip Exercises: Standing   Knee Flexion Strengthening;3 sets;10 reps    Hip Abduction Stengthening;Both;3 sets;10 reps    Abduction Limitations 3#    Other Standing Knee Exercises sidestepping x 2 min 3#    Other Standing Knee Exercises stair taps 4" bos 3 lbs x2 min      Knee/Hip Exercises: Seated   Long Arc Quad Strengthening;Both;3 sets;10 reps    Long Arc Quad Weight 3 lbs.                      PT Short Term Goals - 02/03/21 1540       PT SHORT TERM GOAL #1   Title Patient will be independent with HEP in order to improve functional outcomes.    Time 3    Period Weeks    Status New    Target Date 02/24/21      PT SHORT TERM GOAL #2   Title Patient will report at least 25% improvement in symptoms for improved quality of life.    Baseline 8/10 back pain, self report of 10 min standing tolerance    Time 3    Period Weeks    Status New    Target Date 02/24/21      PT SHORT TERM GOAL #3   Title Demonstrate improved gait speed and tolerance as evidenced by distanc of 80 ft during 2MWT    Baseline 40 ft with RW    Time 3    Period Weeks    Status New    Target Date 02/24/21               PT Long Term Goals - 02/03/21 1541       PT LONG TERM GOAL #1   Title Patient will improve FOTO score by at least 10 points in order to indicate improved tolerance to activity    Baseline 39% function    Time 6    Period Weeks    Status New    Target  Date 03/17/21      PT LONG TERM GOAL #2   Title Demo gross 4/5 BLE strength to improve activity tolerance    Baseline 2+ to 3/5 BLE    Time 6    Period Weeks     Status New    Target Date 03/17/21      PT LONG TERM GOAL #3   Title Patient will report at least 50% improvement in symptoms for improved quality of life.    Baseline 8/10 back pain, self report of 10 min standing tolerance    Time 6    Period Weeks    Status New    Target Date 03/17/21                   Plan - 02/22/21 1551     Clinical Impression Statement Demonstrating improved activity tolerance as evidencd by no need for rest period during bike warm-up and increase in repetition for BLE strengthening without need for rest periods between alternating sets.  Self report of decrease in LBP and decrease in frequency of LLE referred symptoms since [facility].  Continued sessions indicated to improve core/BLE strength to improve stability and activity tolerance. Pt also notes decrease in reliance on use of AD since beginning therapy    Personal Factors and Comorbidities Age;Comorbidity 3+;Time since onset of injury/illness/exacerbation    Comorbidities multiple comorbid conditions    Examination-Activity Limitations Bend;Carry;Lift;Stand;Stairs;Squat;Locomotion Level;Transfers    Examination-Participation Restrictions Church;Community Activity;Laundry;Meal Prep    Stability/Clinical Decision Making Evolving/Moderate complexity    Rehab Potential Good    PT Frequency 2x / week    PT Duration 6 weeks    PT Treatment/Interventions ADLs/Self Care Home Management;Electrical Stimulation;DME Instruction;Traction;Gait training;Stair training;Functional mobility training;Therapeutic activities;Therapeutic exercise;Balance training;Patient/family education;Neuromuscular re-education;Manual techniques;Passive range of motion;Spinal Manipulations;Joint Manipulations;Taping    PT Next Visit Plan Continue with BLE strengthening, core stabilization    PT Home Exercise Plan QS, hip add isometric, SLR 6/22 HR, hip abd standing at sink. PPT, SKTC, DKTC    Consulted and Agree with Plan of Care Patient              Patient will benefit from skilled therapeutic intervention in order to improve the following deficits and impairments:  Abnormal gait, Decreased activity tolerance, Decreased balance, Decreased mobility, Decreased endurance, Decreased strength, Difficulty walking, Postural dysfunction, Improper body mechanics, Pain  Visit Diagnosis: Chronic midline low back pain, unspecified whether sciatica present  Muscle weakness (generalized)  Difficulty in walking, not elsewhere classified     Problem List Patient Active Problem List   Diagnosis Date Noted   Acute pulmonary embolism ([place]) 01/13/2018   Chest pain 01/11/2018   Elevated troponin 01/11/2018   AKI (acute kidney injury) ([place]) 01/11/2018   NSTEMI (non-ST elevated myocardial infarction) ([place]) 01/11/2018   Vascular headache    Seizure prophylaxis    Weakness of left third cranial nerve    Gait disturbance, post-stroke 11/22/2016   Benign essential HTN    Seizure disorder ([place])    Acute blood loss anemia    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   Bilateral leg pain 06/26/2016   AAA (abdominal aortic aneurysm) without rupture ([place]) 06/26/2016   Insomnia 06/07/2015   Urinary incontinence 06/07/2015   Chronic pain syndrome 03/04/2015   Fibromyalgia 03/04/2015   Cervical radiculitis 03/04/2015   Lumbar radicular pain 03/04/2015   Major depression 07/05/2014   Claudication, class I ([place]) 03/18/2013   Smoker unmotivated to quit    3:57 PM, 02/22/21  M. Sherlyn Lees, PT, DPT Physical Therapist- [place] Office Number: [phone]   [facility] [address] [place], [place], [address] Phone: [phone]   Fax:  [phone]  Name: Jennifer Jacobs MRN: [id number] Date of Birth: 03-21-1946

## 2021-02-22 NOTE — Therapy (Signed)
[facility] [address] [place], [place], [address] Phone: [phone]   Fax:  [phone]  Occupational Therapy Treatment  Patient Details  Name: Jennifer Jacobs MRN: [id number] Date of Birth: Apr 13, 1946 Referring Provider (OT): Dr. Arther Abbott   Encounter Date: 02/22/2021   OT End of Session - 02/22/21 1640     Visit Number 3    Number of Visits 8    Date for OT Re-Evaluation 03/05/21    Authorization Type 1) [employer] 2) Medicaid    Authorization Time Period 8 visits approved 6/17-7/16/22    Authorization - Visit Number 2    Authorization - Number of Visits 8    Progress Note Due on Visit 10    OT Start Time 1600    OT Stop Time 1638    OT Time Calculation (min) 38 min    Activity Tolerance Patient tolerated treatment well;Patient limited by fatigue    Behavior During Therapy [facility] for tasks assessed/performed             Past Medical History:  Diagnosis Date   Acute blood loss anemia    AKI (acute kidney injury) ([place]) 01/11/2018   Alcohol abuse 07/05/2014   Allergy    Anxiety    Anxiety and depression    Arthritis    Cognitive deficit due to recent stroke 11/22/2016   COPD (chronic obstructive pulmonary disease) ([place])    Depression    Dyslipidemia    Fibromyalgia    Hepatic cyst    Hyperlipidemia    Hypertension    Leukocytosis    Liver cyst 03/18/2014   01/26/2014 ultrasound Hypoechoic structure adjacent to gallbladder right lobe liver,  measuring 29 x 18 x 17 mm, possibly septated cyst     MI (myocardial infarction) ([place]) 2007   By patient report, not substantiated by recent nuclear stress test.   Neuromuscular disorder ([place])    Seizure disorder ([place])    none since 2002   Seizures ([place])    Smoker unmotivated to quit    Quit for 3 years and then restarted   Substance abuse ([place])    ETOH   Vision abnormalities     Past Surgical History:  Procedure Laterality Date   CATARACT EXTRACTION W/PHACO  Right 06/30/2018   Procedure: CATARACT EXTRACTION PHACO AND INTRAOCULAR LENS PLACEMENT RIGHT EYE;  Surgeon: Tonny Branch, MD;  Location: [facility];  Service: Ophthalmology;  Laterality: Right;  CDE: 9.90   CATARACT EXTRACTION W/PHACO Left 07/14/2018   Procedure: CATARACT EXTRACTION PHACO AND INTRAOCULAR LENS PLACEMENT (IOC);  Surgeon: Tonny Branch, MD;  Location: [facility];  Service: Ophthalmology;  Laterality: Left;  CDE: 6.96   CRANIOTOMY Left 11/19/2016   Procedure: CRANIOTOMY INTRACRANIAL FOR  ANEURYSM CLIPPING;  Surgeon: Kevan Ny Ditty, MD;  Location: [facility];  Service: Neurosurgery;  Laterality: Left;   ELBOW ARTHROPLASTY Left    rods   Exercise tolerance test  04/27/2013   CPET-MET: Submaximal effort (0.96, with goal of greater than 1.09) -- patient states that her effort was limited due to knee pain.  This makes the rest of the interpretation difficult: Peak VO2 - 10.5 (59% moderate), peak 02-pulse -  7..11 (low); heart rate 114 (73% -chronic incompetence considered);; PFTs normal    MOUTH SURGERY     NM MYOVIEW LTD  05/19/2013   LexiScan: EF 80%, no evidence of ischemia or infarction   TRANSTHORACIC ECHOCARDIOGRAM  03/25/2013   EF 55-60%, no regional wall motion abnormalities;  normal diastolic parameters, normal pulmonary pressures.  No valvular lesions noted.  Essentially normal echo     There were no vitals filed for this visit.   Subjective Assessment - 02/22/21 1601     Subjective  S: My left shoulder is sore today.    Currently in Pain? Yes    Pain Score 4     Pain Location Shoulder    Pain Orientation Left    Pain Descriptors / Indicators Sore    Pain Type Chronic pain    Pain Onset More than a month ago    Pain Frequency Constant    Aggravating Factors  movement and increased use    Pain Relieving Factors rest or Tylenol if needed.    Effect of Pain on Daily Activities mod effect                [facility] OT Assessment - 02/22/21 1617       Assessment   Medical  Diagnosis Chronic bilateral shoulder pain      Precautions   Precautions None                      OT Treatments/Exercises (OP) - 02/22/21 1618       Exercises   Exercises Shoulder      Shoulder Exercises: Supine   Protraction Other (comment)   P/ROM 1X each arm   Horizontal ABduction Other (comment)   P/ROM 1X each arm   External Rotation Other (comment)   P/ROM 1X each arm   Internal Rotation Other (comment)   P/ROM 1X each arm   Flexion Other (comment)   P/ROM 1X each arm   ABduction Other (comment)   P/ROM 1X each arm     Shoulder Exercises: Seated   Horizontal ABduction AROM;12 reps;Both    External Rotation AROM;12 reps;Both    Internal Rotation AROM;12 reps;Both    Flexion AROM;12 reps    Abduction AROM;10 reps;Both      Shoulder Exercises: Standing   Extension Theraband;10 reps    Theraband Level (Shoulder Extension) Level 2 (Red)    Row Theraband;10 reps    Theraband Level (Shoulder Row) Level 2 (Red)    Retraction Theraband;10 reps    Theraband Level (Shoulder Retraction) Level 2 (Red)      Shoulder Exercises: ROM/Strengthening   UBE (Upper Arm Bike) level 1 2' forward 2' reverse   pace: 4.0-5.0     Manual Therapy   Manual Therapy Myofascial release    Manual therapy comments completed separately from therapeutic exercises    Myofascial Release myofascial release to bilateral trapezius and scapular regions to decrease pain and fascial restrictions and increase joint ROM                    OT Education - 02/22/21 1633     Education Details A/ROM Shoulder exercises.    Person(s) Educated Patient    Methods Explanation;Demonstration;Handout;Verbal cues    Comprehension Verbalized understanding;Returned demonstration              OT Short Term Goals - 02/17/21 1117       OT SHORT TERM GOAL #1   Title Pt will be provided with and educated on HEP to improve mobility required for ADL completion.    Time 4    Period Weeks     Status On-going    Target Date 03/05/21      OT SHORT TERM GOAL #2   Title Pt  will decrease BUE pain to 3/10 or less to improve ability to sleep for 3+ hours without waking due to pain.    Time 4    Period Weeks    Status On-going      OT SHORT TERM GOAL #3   Title Pt will decrease fascial restrictions in BUE to minimal amounts to improve mobility required for functional reaching tasks.    Time 4    Period Weeks    Status On-going      OT SHORT TERM GOAL #4   Title Pt will increase BUE A/ROM to WNL to improve mobility required for dressing and bathing tasks.    Time 4    Period Weeks    Status On-going                      Plan - 02/22/21 1641     Clinical Impression Statement A: trace fascial restrictions noted on right UE, Min fascial restrictions noted on left upper arm and upper trapezius with manual techniques completed to address. Full passive ROM noted this date. Completed seated A/ROM with reports of fatigue. Rest breaks provided as needed. patient reports that she did not complete any of her HEP while visiting family in [place]. Scapular strengthening exercises were completed with red band. pt required verbal and physical cues for proper form and technique. HEP was updated this session.    Body Structure / Function / Physical Skills ADL;Endurance;UE functional use;Fascial restriction;Pain;ROM;Strength;IADL    Plan P: Myofascial release to left UE only. No passive ROM needed. Follow up on HEP. Continue with scapular theraband increasing independence with form and technique. Supine proximal shoulder strengthening.    OT Home Exercise Plan eval: scapular A/ROM, shoulder AA/ROM 7/6: A/ROM shoulder exercises.    Consulted and Agree with Plan of Care Patient             Patient will benefit from skilled therapeutic intervention in order to improve the following deficits and impairments:   Body Structure / Function / Physical Skills: ADL, Endurance, UE functional use,  Fascial restriction, Pain, ROM, Strength, IADL       Visit Diagnosis: Chronic right shoulder pain  Chronic left shoulder pain    Problem List Patient Active Problem List   Diagnosis Date Noted   Acute pulmonary embolism ([place]) 01/13/2018   Chest pain 01/11/2018   Elevated troponin 01/11/2018   AKI (acute kidney injury) ([place]) 01/11/2018   NSTEMI (non-ST elevated myocardial infarction) ([place]) 01/11/2018   Vascular headache    Seizure prophylaxis    Weakness of left third cranial nerve    Gait disturbance, post-stroke 11/22/2016   Benign essential HTN    Seizure disorder ([place])    Acute blood loss anemia    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   Bilateral leg pain 06/26/2016   AAA (abdominal aortic aneurysm) without rupture ([place]) 06/26/2016   Insomnia 06/07/2015   Urinary incontinence 06/07/2015   Chronic pain syndrome 03/04/2015   Fibromyalgia 03/04/2015   Cervical radiculitis 03/04/2015   Lumbar radicular pain 03/04/2015   Major depression 07/05/2014   Claudication, class I ([place]) 03/18/2013   Smoker unmotivated to quit     Ailene Ravel, OTR/L,CBIS  [phone]   02/22/2021, 4:45 PM  [facility] [place], [place], [address] Phone: [phone]   Fax:  [phone]  Name: Jennifer Jacobs MRN: [id number] Date of Birth: 09-02-1945

## 2021-02-24 ENCOUNTER — Ambulatory Visit (HOSPITAL_COMMUNITY): Payer: Medicare HMO

## 2021-02-24 ENCOUNTER — Other Ambulatory Visit: Payer: Self-pay

## 2021-02-24 ENCOUNTER — Encounter (HOSPITAL_COMMUNITY): Payer: Self-pay

## 2021-02-24 DIAGNOSIS — R29898 Other symptoms and signs involving the musculoskeletal system: Secondary | ICD-10-CM | POA: Diagnosis not present

## 2021-02-24 DIAGNOSIS — M25511 Pain in right shoulder: Secondary | ICD-10-CM | POA: Diagnosis not present

## 2021-02-24 DIAGNOSIS — R262 Difficulty in walking, not elsewhere classified: Secondary | ICD-10-CM | POA: Diagnosis not present

## 2021-02-24 DIAGNOSIS — M545 Low back pain, unspecified: Secondary | ICD-10-CM

## 2021-02-24 DIAGNOSIS — M6281 Muscle weakness (generalized): Secondary | ICD-10-CM

## 2021-02-24 DIAGNOSIS — G8929 Other chronic pain: Secondary | ICD-10-CM

## 2021-02-24 DIAGNOSIS — M25512 Pain in left shoulder: Secondary | ICD-10-CM | POA: Diagnosis not present

## 2021-02-24 NOTE — Therapy (Signed)
[facility] [place], [place], [address] Phone: [phone]   Fax:  [phone]  Physical Therapy Treatment  Patient Details  Name: Jennifer Jacobs MRN: [id number] Date of Birth: 1945-10-21 Referring Provider (PT): Carole Civil, MD   Encounter Date: 02/24/2021   PT End of Session - 02/24/21 1410     Visit Number 8    Number of Visits 12    Date for PT Re-Evaluation 03/17/21    Authorization Type [employer] Medicare HMO, auth required, visit limit MCR guidelines    Authorization Time Period 12 visits approved 6/17-->03/17/21    Authorization - Visit Number 8    Authorization - Number of Visits 12    Progress Note Due on Visit 10    PT Start Time 0998    PT Stop Time 1443    PT Time Calculation (min) 38 min    Activity Tolerance Patient limited by fatigue;Patient limited by pain;Patient tolerated treatment well;No increased pain    Behavior During Therapy WFL for tasks assessed/performed             Past Medical History:  Diagnosis Date   Acute blood loss anemia    AKI (acute kidney injury) ([place]) 01/11/2018   Alcohol abuse 07/05/2014   Allergy    Anxiety    Anxiety and depression    Arthritis    Cognitive deficit due to recent stroke 11/22/2016   COPD (chronic obstructive pulmonary disease) ([place])    Depression    Dyslipidemia    Fibromyalgia    Hepatic cyst    Hyperlipidemia    Hypertension    Leukocytosis    Liver cyst 03/18/2014   01/26/2014 ultrasound Hypoechoic structure adjacent to gallbladder right lobe liver,  measuring 29 x 18 x 17 mm, possibly septated cyst     MI (myocardial infarction) ([place]) 2007   By patient report, not substantiated by recent nuclear stress test.   Neuromuscular disorder ([place])    Seizure disorder ([place])    none since 2002   Seizures ([place])    Smoker unmotivated to quit    Quit for 3 years and then restarted   Substance abuse ([place])    ETOH   Vision abnormalities     Past  Surgical History:  Procedure Laterality Date   CATARACT EXTRACTION W/PHACO Right 06/30/2018   Procedure: CATARACT EXTRACTION PHACO AND INTRAOCULAR LENS PLACEMENT RIGHT EYE;  Surgeon: Tonny Branch, MD;  Location: [facility];  Service: Ophthalmology;  Laterality: Right;  CDE: 9.90   CATARACT EXTRACTION W/PHACO Left 07/14/2018   Procedure: CATARACT EXTRACTION PHACO AND INTRAOCULAR LENS PLACEMENT (IOC);  Surgeon: Tonny Branch, MD;  Location: [facility];  Service: Ophthalmology;  Laterality: Left;  CDE: 6.96   CRANIOTOMY Left 11/19/2016   Procedure: CRANIOTOMY INTRACRANIAL FOR  ANEURYSM CLIPPING;  Surgeon: Kevan Ny Ditty, MD;  Location: [facility];  Service: Neurosurgery;  Laterality: Left;   ELBOW ARTHROPLASTY Left    rods   Exercise tolerance test  04/27/2013   CPET-MET: Submaximal effort (0.96, with goal of greater than 1.09) -- patient states that her effort was limited due to knee pain.  This makes the rest of the interpretation difficult: Peak VO2 - 10.5 (59% moderate), peak 02-pulse -  7..11 (low); heart rate 114 (73% -chronic incompetence considered);; PFTs normal    MOUTH SURGERY     NM MYOVIEW LTD  05/19/2013   LexiScan: EF 80%, no evidence of ischemia or infarction   TRANSTHORACIC ECHOCARDIOGRAM  03/25/2013   EF 55-60%, no regional wall motion abnormalities; normal diastolic parameters, normal pulmonary pressures.  No valvular lesions noted.  Essentially normal echo     There were no vitals filed for this visit.   Subjective Assessment - 02/24/21 1409     Subjective Pt reports her back is feeling better thought does continues to have high pain scale.    Diagnostic tests x-ray Facet arthritis L4 and L5 spondylolisthesis grade 1 spondylosis L4-S1    Patient Stated Goals "Get my back to stop hurting and not fall down so much"    Currently in Pain? Yes    Pain Score 7     Pain Location Back    Pain Orientation Lower    Pain Descriptors / Indicators Sore    Pain Type Chronic pain    Pain Onset  More than a month ago    Pain Frequency Constant    Aggravating Factors  movement and increased use, bending forward    Pain Relieving Factors rest of Tylenol if needed    Effect of Pain on Daily Activities mod effect                               [facility] Adult PT Treatment/Exercise - 02/24/21 0001       Lumbar Exercises: Standing   Heel Raises 20 reps    Row 20 reps    Theraband Level (Row) Level 2 (Red)    Shoulder Extension 20 reps    Theraband Level (Shoulder Extension) Level 2 (Red)    Other Standing Lumbar Exercises marching with ab set alternating 20x; sidestep down blue line x 2 min    Other Standing Lumbar Exercises partial tandem stance 2x 30" no HHA; paloff in NBOS RTB 20x 2sets      Lumbar Exercises: Seated   Sit to Stand 10 reps    Sit to Stand Limitations no HHA, eccentric lowering                      PT Short Term Goals - 02/03/21 1540       PT SHORT TERM GOAL #1   Title Patient will be independent with HEP in order to improve functional outcomes.    Time 3    Period Weeks    Status New    Target Date 02/24/21      PT SHORT TERM GOAL #2   Title Patient will report at least 25% improvement in symptoms for improved quality of life.    Baseline 8/10 back pain, self report of 10 min standing tolerance    Time 3    Period Weeks    Status New    Target Date 02/24/21      PT SHORT TERM GOAL #3   Title Demonstrate improved gait speed and tolerance as evidenced by distanc of 80 ft during 2MWT    Baseline 40 ft with RW    Time 3    Period Weeks    Status New    Target Date 02/24/21               PT Long Term Goals - 02/03/21 1541       PT LONG TERM GOAL #1   Title Patient will improve FOTO score by at least 10 points in order to indicate improved tolerance to activity    Baseline 39% function    Time 6  Period Weeks    Status New    Target Date 03/17/21      PT LONG TERM GOAL #2   Title Demo gross 4/5 BLE  strength to improve activity tolerance    Baseline 2+ to 3/5 BLE    Time 6    Period Weeks    Status New    Target Date 03/17/21      PT LONG TERM GOAL #3   Title Patient will report at least 50% improvement in symptoms for improved quality of life.    Baseline 8/10 back pain, self report of 10 min standing tolerance    Time 6    Period Weeks    Status New    Target Date 03/17/21                   Plan - 02/24/21 1430     Clinical Impression Statement Pt reports she has improved activity tolerance though does require short duration rest breaks throughout session due to Lt LE fatigue.  Session focus on core and proximal strengthening and additonal balance activities to improve stability.  Cueing through standing exercises for increased core activation to reduce lordatic curvature.  Added partial tandem stance with good static balance and paloff exercise for increased core activation.  Reports pain reduced to 6/10 at EOS.    Personal Factors and Comorbidities Age;Comorbidity 3+;Time since onset of injury/illness/exacerbation    Comorbidities multiple comorbid conditions    Examination-Activity Limitations Bend;Carry;Lift;Stand;Stairs;Squat;Locomotion Level;Transfers    Examination-Participation Restrictions Church;Community Activity;Laundry;Meal Prep    Stability/Clinical Decision Making Evolving/Moderate complexity    Clinical Decision Making Moderate    Rehab Potential Good    PT Frequency 2x / week    PT Duration 6 weeks    PT Treatment/Interventions ADLs/Self Care Home Management;Electrical Stimulation;DME Instruction;Traction;Gait training;Stair training;Functional mobility training;Therapeutic activities;Therapeutic exercise;Balance training;Patient/family education;Neuromuscular re-education;Manual techniques;Passive range of motion;Spinal Manipulations;Joint Manipulations;Taping    PT Next Visit Plan Continue with BLE strengthening, core stabilization    PT Home Exercise  Plan QS, hip add isometric, SLR 6/22 HR, hip abd standing at sink. PPT, SKTC, DKTC    Consulted and Agree with Plan of Care Patient             Patient will benefit from skilled therapeutic intervention in order to improve the following deficits and impairments:  Abnormal gait, Decreased activity tolerance, Decreased balance, Decreased mobility, Decreased endurance, Decreased strength, Difficulty walking, Postural dysfunction, Improper body mechanics, Pain  Visit Diagnosis: Chronic midline low back pain, unspecified whether sciatica present  Muscle weakness (generalized)  Difficulty in walking, not elsewhere classified     Problem List Patient Active Problem List   Diagnosis Date Noted   Acute pulmonary embolism ([place]) 01/13/2018   Chest pain 01/11/2018   Elevated troponin 01/11/2018   AKI (acute kidney injury) ([place]) 01/11/2018   NSTEMI (non-ST elevated myocardial infarction) ([place]) 01/11/2018   Vascular headache    Seizure prophylaxis    Weakness of left third cranial nerve    Gait disturbance, post-stroke 11/22/2016   Benign essential HTN    Seizure disorder ([place])    Acute blood loss anemia    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   Bilateral leg pain 06/26/2016   AAA (abdominal aortic aneurysm) without rupture ([place]) 06/26/2016   Insomnia 06/07/2015   Urinary incontinence 06/07/2015   Chronic pain syndrome 03/04/2015   Fibromyalgia 03/04/2015   Cervical radiculitis 03/04/2015   Lumbar radicular pain 03/04/2015   Major depression 07/05/2014  Claudication, class I ([place]) 03/18/2013   Smoker unmotivated to quit    Ihor Austin, LPTA/CLT; CBIS [phone]  Aldona Lento 02/24/2021, 2:47 PM  [facility] [place], [place], [address] Phone: [phone]   Fax:  [phone]  Name: KESA BIRKY MRN: [id number] Date of Birth: October 14, 1945

## 2021-02-27 ENCOUNTER — Ambulatory Visit (HOSPITAL_COMMUNITY): Payer: Medicare HMO | Admitting: Physical Therapy

## 2021-02-27 ENCOUNTER — Other Ambulatory Visit: Payer: Self-pay

## 2021-02-27 ENCOUNTER — Encounter (HOSPITAL_COMMUNITY): Payer: Self-pay | Admitting: Physical Therapy

## 2021-02-27 DIAGNOSIS — M545 Low back pain, unspecified: Secondary | ICD-10-CM | POA: Diagnosis not present

## 2021-02-27 DIAGNOSIS — M6281 Muscle weakness (generalized): Secondary | ICD-10-CM | POA: Diagnosis not present

## 2021-02-27 DIAGNOSIS — G8929 Other chronic pain: Secondary | ICD-10-CM | POA: Diagnosis not present

## 2021-02-27 DIAGNOSIS — R262 Difficulty in walking, not elsewhere classified: Secondary | ICD-10-CM

## 2021-02-27 DIAGNOSIS — M25512 Pain in left shoulder: Secondary | ICD-10-CM | POA: Diagnosis not present

## 2021-02-27 DIAGNOSIS — R29898 Other symptoms and signs involving the musculoskeletal system: Secondary | ICD-10-CM | POA: Diagnosis not present

## 2021-02-27 DIAGNOSIS — M25511 Pain in right shoulder: Secondary | ICD-10-CM | POA: Diagnosis not present

## 2021-02-27 NOTE — Therapy (Signed)
[place], [place], [address] Phone: [phone]   Fax:  [phone]  Physical Therapy Treatment/progress Note/ Recert  Patient Details  Name: Jennifer Jacobs MRN: [id number] Date of Birth: 03/02/1946 Referring Provider (PT): Carole Civil, MD   Encounter Date: 02/27/2021 Progress Note   Reporting Period 02/03/21 to 02/27/21   See note below for Objective Data and Assessment of Progress/Goals    PT End of Session - 02/27/21 1313     Visit Number 9    Number of Visits 20    Date for PT Re-Evaluation 03/27/21    Authorization Type [employer] HMO, auth required, visit limit MCR guidelines    Authorization Time Period 12 visits approved 6/17-->03/17/21    Authorization - Visit Number 0    Authorization - Number of Visits 8    Progress Note Due on Visit 19    PT Start Time 1315    PT Stop Time 1355    PT Time Calculation (min) 40 min    Activity Tolerance Patient tolerated treatment well;No increased pain    Behavior During Therapy WFL for tasks assessed/performed             Past Medical History:  Diagnosis Date   Acute blood loss anemia    AKI (acute kidney injury) ([place]) 01/11/2018   Alcohol abuse 07/05/2014   Allergy    Anxiety    Anxiety and depression    Arthritis    Cognitive deficit due to recent stroke 11/22/2016   COPD (chronic obstructive pulmonary disease) ([place])    Depression    Dyslipidemia    Fibromyalgia    Hepatic cyst    Hyperlipidemia    Hypertension    Leukocytosis    Liver cyst 03/18/2014   01/26/2014 ultrasound Hypoechoic structure adjacent to gallbladder right lobe liver,  measuring 29 x 18 x 17 mm, possibly septated cyst     MI (myocardial infarction) ([place]) 2007   By patient report, not substantiated by recent nuclear stress test.   Neuromuscular disorder ([place])    Seizure disorder ([place])    none since 2002   Seizures ([place])    Smoker unmotivated to quit    Quit  for 3 years and then restarted   Substance abuse ([place])    ETOH   Vision abnormalities     Past Surgical History:  Procedure Laterality Date   CATARACT EXTRACTION W/PHACO Right 06/30/2018   Procedure: CATARACT EXTRACTION PHACO AND INTRAOCULAR LENS PLACEMENT RIGHT EYE;  Surgeon: Tonny Branch, MD;  Location: [facility];  Service: Ophthalmology;  Laterality: Right;  CDE: 9.90   CATARACT EXTRACTION W/PHACO Left 07/14/2018   Procedure: CATARACT EXTRACTION PHACO AND INTRAOCULAR LENS PLACEMENT (IOC);  Surgeon: Tonny Branch, MD;  Location: [facility];  Service: Ophthalmology;  Laterality: Left;  CDE: 6.96   CRANIOTOMY Left 11/19/2016   Procedure: CRANIOTOMY INTRACRANIAL FOR  ANEURYSM CLIPPING;  Surgeon: Kevan Ny Ditty, MD;  Location: [facility];  Service: Neurosurgery;  Laterality: Left;   ELBOW ARTHROPLASTY Left    rods   Exercise tolerance test  04/27/2013   CPET-MET: Submaximal effort (0.96, with goal of greater than 1.09) -- patient states that her effort was limited due to knee pain.  This makes the rest of the interpretation difficult: Peak VO2 - 10.5 (59% moderate), peak 02-pulse -  7..11 (low); heart rate 114 (73% -chronic incompetence considered);; PFTs normal    MOUTH SURGERY     NM MYOVIEW LTD  05/19/2013   LexiScan: EF 80%, no evidence of ischemia or infarction   TRANSTHORACIC ECHOCARDIOGRAM  03/25/2013   EF 55-60%, no regional wall motion abnormalities; normal diastolic parameters, normal pulmonary pressures.  No valvular lesions noted.  Essentially normal echo     There were no vitals filed for this visit.   Subjective Assessment - 02/27/21 1314     Subjective Patient has been doing exercises at home. Patient states 50% improvmnet with PT intervention.    Diagnostic tests x-ray [place] arthritis L4 and L5 spondylolisthesis grade 1 spondylosis L4-S1    Patient Stated Goals "Get my back to stop hurting and not fall down so much"    Currently in Pain? Yes    Pain Score 7     Pain Location Knee     Pain Orientation Left    Pain Descriptors / Indicators Sore    Pain Type Chronic pain    Pain Onset More than a month ago    Pain Frequency Constant                [facility] PT Assessment - 02/27/21 0001       Assessment   Medical Diagnosis LBP    Referring Provider (PT) Carole Civil, MD      [place] residence    Living Arrangements Alone    Type of [place] to enter    Entrance Stairs-Number of Steps [place] One level    [place] - 4 wheels;Cane - single point;Shower seat;Toilet riser      Prior Function   Level of Independence Requires assistive device for independence;Needs assistance with ADLs    Level of Independence - [place] Minimal    Vocation Retired    Leisure at home. TV watching      Observation/Other Assessments   Focus on Therapeutic Outcomes (FOTO)  70% function      Coordination   Gross Motor Movements are Fluid and Coordinated Yes      AROM   Lumbar Flexion 0% limited    Lumbar Extension 50% limited    Lumbar - Right Side Bend 25% limited    Lumbar - Left Side Bend 25% limited      Strength   Right Hip Flexion 4-/5    Right Hip Extension 2+/5    Right Hip ABduction 3-/5    Right Hip ADduction 3/5    Left Hip Flexion 4-/5    Left Hip Extension 2+/5    Left Hip ABduction 3/5    Left Hip ADduction 3/5    Right Knee Flexion 4+/5    Right Knee Extension 4+/5    Left Knee Flexion 4+/5    Left Knee Extension 4+/5    Right Ankle Dorsiflexion 4+/5    Right Ankle Plantar Flexion 3-/5    Left Ankle Dorsiflexion 4+/5    Left Ankle Plantar Flexion 2+/5    Lumbar Flexion --      Ambulation/Gait   Ambulation/Gait Yes    Ambulation/Gait Assistance 5: Supervision    Ambulation Distance (Feet) 340 Feet    Assistive device Rolling walker    Gait Pattern Step-through pattern;Decreased step length - right;Decreased step length - left    Gait velocity  decreased    Gait Comments 2MWT  [place] Adult PT Treatment/Exercise - 02/27/21 0001       Lumbar Exercises: Aerobic   Recumbent Bike 5 min level 2 for dynamic warm-up      Knee/Hip Exercises: Standing   Lateral Step Up Both;2 sets;10 reps;Hand Hold: 2;Step Height: 4"    Forward Step Up Both;2 sets;10 reps;Hand Hold: 2;Step Height: 4"      Knee/Hip Exercises: Seated   Sit to Sand 10 reps;without UE support                    PT Education - 02/27/21 1314     Education Details HEP    Person(s) Educated Patient    Methods Explanation    Comprehension Verbalized understanding              PT Short Term Goals - 02/27/21 1330       PT SHORT TERM GOAL #1   Title Patient will be independent with HEP in order to improve functional outcomes.    Time 3    Period Weeks    Status Achieved    Target Date 02/24/21      PT SHORT TERM GOAL #2   Title Patient will report at least 25% improvement in symptoms for improved quality of life.    Baseline 50%    Time 3    Period Weeks    Status Achieved    Target Date 02/24/21      PT SHORT TERM GOAL #3   Title Demonstrate improved gait speed and tolerance as evidenced by distanc of 80 ft during 2MWT    Baseline 40 ft with RW    Time 3    Period Weeks    Status Achieved    Target Date 02/24/21               PT Long Term Goals - 02/27/21 1334       PT LONG TERM GOAL #1   Title Patient will improve FOTO score by at least 10 points in order to indicate improved tolerance to activity    Baseline 39% function    Time 6    Period Weeks    Status Achieved      PT LONG TERM GOAL #2   Title Demo gross 4/5 BLE strength to improve activity tolerance    Baseline 2+ to 4+/5 BLE    Time 6    Period Weeks    Status On-going      PT LONG TERM GOAL #3   Title Patient will report at least 50% improvement in symptoms for improved quality of life.    Baseline 8/10 back pain,  self report of 10 min standing tolerance    Time 6    Period Weeks    Status Achieved      PT LONG TERM GOAL #4   Title Patient will report at least 75% improvement in symptoms for improved QoL.    Time 4    Period Weeks    Status New    Target Date 03/27/21                   Plan - 02/27/21 1314     Clinical Impression Statement Patient has met 3/3 short term goals and 2/3 long term goals with ability to complete HEP and improvement in symptoms, gait, activity tolerance, strength. 1 new goal added for improving symptoms/activity tolerance. Extending POC to continue to improve patient functional mobility and symptoms. Remaining goal not  met at this time due to impaired strength although it has improved greatly. Patient with large increase in ambulation speed and mechanics. Began session with bike for dynamic warm up but patient requires rest break half way through due to fatigue. Patient showing improving activity tolerance. She requires UE support with step up secondary to impaired strength/balance. Patient will continue to benefit from skilled physical therapy in order to reduce impairment and improve function.    Personal Factors and Comorbidities Age;Comorbidity 3+;Time since onset of injury/illness/exacerbation    Comorbidities multiple comorbid conditions    Examination-Activity Limitations Bend;Carry;Lift;Stand;Stairs;Squat;Locomotion Level;Transfers    Examination-Participation Restrictions Church;Community Activity;Laundry;Meal Prep    Stability/Clinical Decision Making Evolving/Moderate complexity    Rehab Potential Good    PT Frequency 2x / week    PT Duration 6 weeks    PT Treatment/Interventions ADLs/Self Care Home Management;Electrical Stimulation;DME Instruction;Traction;Gait training;Stair training;Functional mobility training;Therapeutic activities;Therapeutic exercise;Balance training;Patient/family education;Neuromuscular re-education;Manual techniques;Passive  range of motion;Spinal Manipulations;Joint Manipulations;Taping    PT Next Visit Plan Continue with BLE strengthening, core stabilization    PT Home Exercise Plan QS, hip add isometric, SLR 6/22 HR, hip abd standing at sink. PPT, SKTC, DKTC    Consulted and Agree with Plan of Care Patient             Patient will benefit from skilled therapeutic intervention in order to improve the following deficits and impairments:  Abnormal gait, Decreased activity tolerance, Decreased balance, Decreased mobility, Decreased endurance, Decreased strength, Difficulty walking, Postural dysfunction, Improper body mechanics, Pain  Visit Diagnosis: Chronic midline low back pain, unspecified whether sciatica present  Muscle weakness (generalized)  Difficulty in walking, not elsewhere classified     Problem List Patient Active Problem List   Diagnosis Date Noted   Acute pulmonary embolism ([place]) 01/13/2018   Chest pain 01/11/2018   Elevated troponin 01/11/2018   AKI (acute kidney injury) ([place]) 01/11/2018   NSTEMI (non-ST elevated myocardial infarction) ([place]) 01/11/2018   Vascular headache    Seizure prophylaxis    Weakness of left third cranial nerve    Gait disturbance, post-stroke 11/22/2016   Benign essential HTN    Seizure disorder ([place])    Acute blood loss anemia    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   Bilateral leg pain 06/26/2016   AAA (abdominal aortic aneurysm) without rupture ([place]) 06/26/2016   Insomnia 06/07/2015   Urinary incontinence 06/07/2015   Chronic pain syndrome 03/04/2015   Fibromyalgia 03/04/2015   Cervical radiculitis 03/04/2015   Lumbar radicular pain 03/04/2015   Major depression 07/05/2014   Claudication, class I ([place]) 03/18/2013   Smoker unmotivated to quit    2:00 PM, 02/27/21 Mearl Latin PT, DPT Physical Therapist at [facility] [place], [place], [address] Phone: [phone]   Fax:  [phone]  Name: MOZETTA MURFIN MRN: [id number] Date of Birth: 24-Oct-1945

## 2021-03-01 ENCOUNTER — Telehealth (HOSPITAL_COMMUNITY): Payer: Self-pay | Admitting: Physical Therapy

## 2021-03-01 ENCOUNTER — Ambulatory Visit (HOSPITAL_COMMUNITY): Payer: Medicare HMO | Admitting: Physical Therapy

## 2021-03-01 ENCOUNTER — Ambulatory Visit (HOSPITAL_COMMUNITY): Payer: Medicare HMO | Admitting: Occupational Therapy

## 2021-03-01 NOTE — Telephone Encounter (Signed)
Pt did not show for OT or PT appointments today.  Called and left VM on cell phone reminding of next scheduled appointments on Friday  Lurena Nida, PTA/CLT [phone]

## 2021-03-03 ENCOUNTER — Encounter (HOSPITAL_COMMUNITY): Payer: Self-pay

## 2021-03-03 ENCOUNTER — Encounter (HOSPITAL_COMMUNITY): Payer: Self-pay | Admitting: Occupational Therapy

## 2021-03-03 ENCOUNTER — Ambulatory Visit (HOSPITAL_COMMUNITY): Payer: Medicare HMO | Admitting: Occupational Therapy

## 2021-03-03 ENCOUNTER — Other Ambulatory Visit: Payer: Self-pay

## 2021-03-03 ENCOUNTER — Ambulatory Visit (HOSPITAL_COMMUNITY): Payer: Medicare HMO

## 2021-03-03 DIAGNOSIS — M25511 Pain in right shoulder: Secondary | ICD-10-CM | POA: Diagnosis not present

## 2021-03-03 DIAGNOSIS — G8929 Other chronic pain: Secondary | ICD-10-CM

## 2021-03-03 DIAGNOSIS — R29898 Other symptoms and signs involving the musculoskeletal system: Secondary | ICD-10-CM | POA: Diagnosis not present

## 2021-03-03 DIAGNOSIS — M25512 Pain in left shoulder: Secondary | ICD-10-CM | POA: Diagnosis not present

## 2021-03-03 DIAGNOSIS — M6281 Muscle weakness (generalized): Secondary | ICD-10-CM | POA: Diagnosis not present

## 2021-03-03 DIAGNOSIS — R262 Difficulty in walking, not elsewhere classified: Secondary | ICD-10-CM | POA: Diagnosis not present

## 2021-03-03 DIAGNOSIS — M545 Low back pain, unspecified: Secondary | ICD-10-CM | POA: Diagnosis not present

## 2021-03-03 NOTE — Patient Instructions (Signed)

## 2021-03-03 NOTE — Therapy (Signed)
[facility] [address] [place], [place], [address] Phone: [phone]   Fax:  [phone]  Occupational Therapy Treatment  Patient Details  Name: Jennifer Jacobs MRN: [id number] Date of Birth: 07-Jul-1946 Referring Provider (OT): Dr. Arther Abbott   Encounter Date: 03/03/2021   OT End of Session - 03/03/21 1506     Visit Number 4    Number of Visits 8    Date for OT Re-Evaluation 03/05/21    Authorization Type 1) [employer] Medicare 2) Medicaid    Authorization Time Period 8 visits approved 6/17-7/16/22    Authorization - Visit Number 3    Authorization - Number of Visits 8    Progress Note Due on Visit 10    OT Start Time 1431    OT Stop Time 1506    OT Time Calculation (min) 35 min    Activity Tolerance Patient tolerated treatment well;Patient limited by fatigue    Behavior During Therapy [facility] for tasks assessed/performed             Past Medical History:  Diagnosis Date   Acute blood loss anemia    AKI (acute kidney injury) ([place]) 01/11/2018   Alcohol abuse 07/05/2014   Allergy    Anxiety    Anxiety and depression    Arthritis    Cognitive deficit due to recent stroke 11/22/2016   COPD (chronic obstructive pulmonary disease) ([place])    Depression    Dyslipidemia    Fibromyalgia    Hepatic cyst    Hyperlipidemia    Hypertension    Leukocytosis    Liver cyst 03/18/2014   01/26/2014 ultrasound Hypoechoic structure adjacent to gallbladder right lobe liver,  measuring 29 x 18 x 17 mm, possibly septated cyst     MI (myocardial infarction) ([place]) 2007   By patient report, not substantiated by recent nuclear stress test.   Neuromuscular disorder ([place])    Seizure disorder ([place])    none since 2002   Seizures ([place])    Smoker unmotivated to quit    Quit for 3 years and then restarted   Substance abuse ([place])    ETOH   Vision abnormalities     Past Surgical History:  Procedure Laterality Date   CATARACT EXTRACTION W/PHACO  Right 06/30/2018   Procedure: CATARACT EXTRACTION PHACO AND INTRAOCULAR LENS PLACEMENT RIGHT EYE;  Surgeon: Tonny Branch, MD;  Location: [facility];  Service: Ophthalmology;  Laterality: Right;  CDE: 9.90   CATARACT EXTRACTION W/PHACO Left 07/14/2018   Procedure: CATARACT EXTRACTION PHACO AND INTRAOCULAR LENS PLACEMENT (IOC);  Surgeon: Tonny Branch, MD;  Location: [facility];  Service: Ophthalmology;  Laterality: Left;  CDE: 6.96   CRANIOTOMY Left 11/19/2016   Procedure: CRANIOTOMY INTRACRANIAL FOR  ANEURYSM CLIPPING;  Surgeon: Kevan Ny Ditty, MD;  Location: [facility];  Service: Neurosurgery;  Laterality: Left;   ELBOW ARTHROPLASTY Left    rods   Exercise tolerance test  04/27/2013   CPET-MET: Submaximal effort (0.96, with goal of greater than 1.09) -- patient states that her effort was limited due to knee pain.  This makes the rest of the interpretation difficult: Peak VO2 - 10.5 (59% moderate), peak 02-pulse -  7..11 (low); heart rate 114 (73% -chronic incompetence considered);; PFTs normal    MOUTH SURGERY     NM MYOVIEW LTD  05/19/2013   LexiScan: EF 80%, no evidence of ischemia or infarction   TRANSTHORACIC ECHOCARDIOGRAM  03/25/2013   EF 55-60%, no regional wall motion abnormalities;  normal diastolic parameters, normal pulmonary pressures.  No valvular lesions noted.  Essentially normal echo     There were no vitals filed for this visit.   Subjective Assessment - 03/03/21 1433     Subjective  S: They don't hurt much anymore.    Currently in Pain? Yes    Pain Score 3     Pain Location Shoulder    Pain Orientation Right;Left    Pain Descriptors / Indicators Sore    Pain Type Chronic pain    Pain Radiating Towards none    Pain Onset More than a month ago    Pain Frequency Constant    Aggravating Factors  increased use    Pain Relieving Factors rest, tylenol, gabapentin    Effect of Pain on Daily Activities min effect on ADLs    Multiple Pain Sites No                [facility] OT  Assessment - 03/03/21 1433       Assessment   Medical Diagnosis Chronic bilateral shoulder pain      Precautions   Precautions None      Observation/Other Assessments   Focus on Therapeutic Outcomes (FOTO)  66/100   53/100 previous     Palpation   Palpation comment trace fascial restrictions along bilateral upper trapezius and scapular regions      AROM   Overall AROM Comments Assessed seated, er/IR adducted    AROM Assessment Site Shoulder    Right/Left Shoulder Right;Left    Right Shoulder Flexion 150 Degrees   121 previous   Right Shoulder ABduction 170 Degrees   120 previous   Right Shoulder Internal Rotation 90 Degrees   same as previous   Right Shoulder External Rotation 85 Degrees   same as previous   Left Shoulder Flexion 145 Degrees   130 previous   Left Shoulder ABduction 165 Degrees   116 previous   Left Shoulder Internal Rotation 90 Degrees   same as previous   Left Shoulder External Rotation 85 Degrees   82 previous     Strength   Overall Strength Comments Assessed seated, er/IR adducted    Strength Assessment Site Shoulder    Right/Left Shoulder Right;Left    Right Shoulder Flexion 4+/5   same as previous   Right Shoulder ABduction 5/5   4+/5 previous   Right Shoulder Internal Rotation 5/5   same as previous   Right Shoulder External Rotation 5/5   4+/5 previous   Left Shoulder Flexion 4+/5   same as previous   Left Shoulder ABduction 5/5   4+/5 previous   Left Shoulder Internal Rotation 5/5   same as previous   Left Shoulder External Rotation 5/5   4+/5 previous                     OT Treatments/Exercises (OP) - 03/03/21 1434       Exercises   Exercises Shoulder      Shoulder Exercises: Seated   Protraction AROM;12 reps;Both    Horizontal ABduction AROM;12 reps;Both    External Rotation AROM;12 reps;Both    Internal Rotation AROM;12 reps;Both    Flexion AROM;12 reps    Abduction AROM;10 reps;Both      Shoulder Exercises: Standing    Extension Theraband;10 reps    Theraband Level (Shoulder Extension) Level 2 (Red)    Row Theraband;10 reps    Theraband Level (Shoulder Row) Level 2 (Red)    Retraction  Theraband;10 reps    Theraband Level (Shoulder Retraction) Level 2 (Red)      Manual Therapy   Manual Therapy Myofascial release    Manual therapy comments completed separately from therapeutic exercises    Myofascial Release myofascial release to bilateral trapezius and scapular regions to decrease pain and fascial restrictions and increase joint ROM                    OT Education - 03/03/21 1450     Education Details red scapular theraband    Person(s) Educated Patient    Methods Explanation;Demonstration;Handout;Verbal cues    Comprehension Verbalized understanding;Returned demonstration              OT Short Term Goals - 03/03/21 1447       OT SHORT TERM GOAL #1   Title Pt will be provided with and educated on HEP to improve mobility required for ADL completion.    Time 4    Period Weeks    Status Achieved    Target Date 03/05/21      OT SHORT TERM GOAL #2   Title Pt will decrease BUE pain to 3/10 or less to improve ability to sleep for 3+ hours without waking due to pain.    Time 4    Period Weeks    Status Achieved      OT SHORT TERM GOAL #3   Title Pt will decrease fascial restrictions in BUE to minimal amounts to improve mobility required for functional reaching tasks.    Time 4    Period Weeks    Status Achieved      OT SHORT TERM GOAL #4   Title Pt will increase BUE A/ROM to WNL to improve mobility required for dressing and bathing tasks.    Time 4    Period Weeks    Status Partially Met                      Plan - 03/03/21 1450     Clinical Impression Statement A: Reassessment completed this session, pt had made good progress with her BUE ROM, strength, and pain, and has met all goals at this time. Pt reports improved ability to perform ADLs including  sleeping, dressing, bathing, and meal preparation. Discussed progress and pt is agreeable to discharge with HEP today. Reviewed A/ROM exercises, verbal cuing for form, and completed scapular theraband exercises with HEP updated.    Body Structure / Function / Physical Skills ADL;Endurance;UE functional use;Fascial restriction;Pain;ROM;Strength;IADL    Plan P: Discharge pt    OT Home Exercise Plan eval: scapular A/ROM, shoulder AA/ROM 7/6: A/ROM shoulder exercises; 7/15: red scapular theraband    Consulted and Agree with Plan of Care Patient             Patient will benefit from skilled therapeutic intervention in order to improve the following deficits and impairments:   Body Structure / Function / Physical Skills: ADL, Endurance, UE functional use, Fascial restriction, Pain, ROM, Strength, IADL       Visit Diagnosis: Chronic right shoulder pain  Chronic left shoulder pain  Other symptoms and signs involving the musculoskeletal system    Problem List Patient Active Problem List   Diagnosis Date Noted   Acute pulmonary embolism ([place]) 01/13/2018   Chest pain 01/11/2018   Elevated troponin 01/11/2018   AKI (acute kidney injury) ([place]) 01/11/2018   NSTEMI (non-ST elevated myocardial infarction) ([place]) 01/11/2018   Vascular headache  Seizure prophylaxis    Weakness of left third cranial nerve    Gait disturbance, post-stroke 11/22/2016   Benign essential HTN    Seizure disorder ([place])    Acute blood loss anemia    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   Bilateral leg pain 06/26/2016   AAA (abdominal aortic aneurysm) without rupture ([place]) 06/26/2016   Insomnia 06/07/2015   Urinary incontinence 06/07/2015   Chronic pain syndrome 03/04/2015   Fibromyalgia 03/04/2015   Cervical radiculitis 03/04/2015   Lumbar radicular pain 03/04/2015   Major depression 07/05/2014   Claudication, class I ([place]) 03/18/2013   Smoker unmotivated to quit     Guadelupe Sabin,  OTR/L  [phone] 03/03/2021, 3:06 PM  [facility] [place], [place], [address] Phone: [phone]   Fax:  [phone]  Name: Jennifer Jacobs MRN: [id number] Date of Birth: [birth year]

## 2021-03-06 ENCOUNTER — Other Ambulatory Visit: Payer: Self-pay

## 2021-03-06 ENCOUNTER — Ambulatory Visit (HOSPITAL_COMMUNITY): Payer: Medicare HMO | Admitting: Physical Therapy

## 2021-03-06 ENCOUNTER — Encounter (HOSPITAL_COMMUNITY): Payer: Self-pay | Admitting: Physical Therapy

## 2021-03-06 DIAGNOSIS — G8929 Other chronic pain: Secondary | ICD-10-CM

## 2021-03-06 DIAGNOSIS — M545 Low back pain, unspecified: Secondary | ICD-10-CM

## 2021-03-06 DIAGNOSIS — M25511 Pain in right shoulder: Secondary | ICD-10-CM | POA: Diagnosis not present

## 2021-03-06 DIAGNOSIS — M25512 Pain in left shoulder: Secondary | ICD-10-CM | POA: Diagnosis not present

## 2021-03-06 DIAGNOSIS — R262 Difficulty in walking, not elsewhere classified: Secondary | ICD-10-CM | POA: Diagnosis not present

## 2021-03-06 DIAGNOSIS — M6281 Muscle weakness (generalized): Secondary | ICD-10-CM

## 2021-03-06 DIAGNOSIS — R29898 Other symptoms and signs involving the musculoskeletal system: Secondary | ICD-10-CM | POA: Diagnosis not present

## 2021-03-06 NOTE — Therapy (Signed)
[place], [place], [address] Phone: [phone]   Fax:  [phone]  Physical Therapy Treatment  Patient Details  Name: Jennifer Jacobs MRN: [id number] Date of Birth: [birth year] Referring Provider (PT): Carole Civil, MD   Encounter Date: 03/06/2021   PT End of Session - 03/06/21 1311     Visit Number 10    Number of Visits 20    Date for PT Re-Evaluation 03/27/21    Authorization Type [employer] H[employer], auth required, visit limit MCR guidelines    Authorization Time Period 12 visits approved 6/17-->03/17/21; 12 visits approved 7/11-8/8    Authorization - Visit Number 1    Authorization - Number of Visits 8    Progress Note Due on Visit 19    PT Start Time 8250    PT Stop Time 1352    PT Time Calculation (min) 39 min    Activity Tolerance Patient tolerated treatment well;No increased pain    Behavior During Therapy WFL for tasks assessed/performed             Past Medical History:  Diagnosis Date   Acute blood loss anemia    AKI (acute kidney injury) ([place]) 01/11/2018   Alcohol abuse 07/05/2014   Allergy    Anxiety    Anxiety and depression    Arthritis    Cognitive deficit due to recent stroke 11/22/2016   COPD (chronic obstructive pulmonary disease) ([place])    Depression    Dyslipidemia    Fibromyalgia    Hepatic cyst    Hyperlipidemia    Hypertension    Leukocytosis    Liver cyst 03/18/2014   01/26/2014 ultrasound Hypoechoic structure adjacent to gallbladder right lobe liver,  measuring 29 x 18 x 17 mm, possibly septated cyst     MI (myocardial infarction) ([place]) 2007   By patient report, not substantiated by recent nuclear stress test.   Neuromuscular disorder ([place])    Seizure disorder ([place])    none since 2002   Seizures ([place])    Smoker unmotivated to quit    Quit for 3 years and then restarted   Substance abuse ([place])    ETOH   Vision abnormalities     Past Surgical History:   Procedure Laterality Date   CATARACT EXTRACTION W/PHACO Right 06/30/2018   Procedure: CATARACT EXTRACTION PHACO AND INTRAOCULAR LENS PLACEMENT RIGHT EYE;  Surgeon: Tonny Branch, MD;  Location: [facility];  Service: Ophthalmology;  Laterality: Right;  CDE: 9.90   CATARACT EXTRACTION W/PHACO Left 07/14/2018   Procedure: CATARACT EXTRACTION PHACO AND INTRAOCULAR LENS PLACEMENT (IOC);  Surgeon: Tonny Branch, MD;  Location: [facility];  Service: Ophthalmology;  Laterality: Left;  CDE: 6.96   CRANIOTOMY Left 11/19/2016   Procedure: CRANIOTOMY INTRACRANIAL FOR  ANEURYSM CLIPPING;  Surgeon: Kevan Ny Ditty, MD;  Location: [facility];  Service: Neurosurgery;  Laterality: Left;   ELBOW ARTHROPLASTY Left    rods   Exercise tolerance test  04/27/2013   CPET-MET: Submaximal effort (0.96, with goal of greater than 1.09) -- patient states that her effort was limited due to knee pain.  This makes the rest of the interpretation difficult: Peak VO2 - 10.5 (59% moderate), peak 02-pulse -  7..11 (low); heart rate 114 (73% -chronic incompetence considered);; PFTs normal    MOUTH SURGERY     NM MYOVIEW LTD  05/19/2013   LexiScan: EF 80%, no evidence of ischemia or infarction   TRANSTHORACIC ECHOCARDIOGRAM  03/25/2013  EF 55-60%, no regional wall motion abnormalities; normal diastolic parameters, normal pulmonary pressures.  No valvular lesions noted.  Essentially normal echo     There were no vitals filed for this visit.   Subjective Assessment - 03/06/21 1313     Subjective Patient states nothing new. Exercises been going alright.    Diagnostic tests x-ray Facet arthritis L4 and L5 spondylolisthesis grade 1 spondylosis L4-S1    Patient Stated Goals "Get my back to stop hurting and not fall down so much"    Currently in Pain? No/denies    Pain Onset More than a month ago                               [facility] Adult PT Treatment/Exercise - 03/06/21 0001       Lumbar Exercises: Aerobic   Recumbent  Bike 5 min level 3 for dynamic warm-up      Knee/Hip Exercises: Standing   Hip Abduction Stengthening;Both;3 sets;10 reps    Abduction Limitations 3#    Lateral Step Up Both;2 sets;10 reps;Hand Hold: 2;Step Height: 4"    Forward Step Up Both;10 reps;Hand Hold: 2;Step Height: 4";3 sets      Knee/Hip Exercises: Seated   Long Arc Quad AROM;Both;10 reps    Long Arc Quad Weight 3 lbs.    Long [employer] Limitations 5 second holds    Sit to [employer] 10 reps;without UE support                    PT Education - 03/06/21 1313     Education Details HEP    Person(s) Educated Patient    Methods Explanation;Demonstration    Comprehension Verbalized understanding;Returned demonstration              PT Short Term Goals - 02/27/21 1330       PT SHORT TERM GOAL #1   Title Patient will be independent with HEP in order to improve functional outcomes.    Time 3    Period Weeks    Status Achieved    Target Date 02/24/21      PT SHORT TERM GOAL #2   Title Patient will report at least 25% improvement in symptoms for improved quality of life.    Baseline 50%    Time 3    Period Weeks    Status Achieved    Target Date 02/24/21      PT SHORT TERM GOAL #3   Title Demonstrate improved gait speed and tolerance as evidenced by distanc of 80 ft during 2MWT    Baseline 40 ft with RW    Time 3    Period Weeks    Status Achieved    Target Date 02/24/21               PT Long Term Goals - 02/27/21 1334       PT LONG TERM GOAL #1   Title Patient will improve FOTO score by at least 10 points in order to indicate improved tolerance to activity    Baseline 39% function    Time 6    Period Weeks    Status Achieved      PT LONG TERM GOAL #2   Title Demo gross 4/5 BLE strength to improve activity tolerance    Baseline 2+ to 4+/5 BLE    Time 6    Period Weeks    Status On-going  PT LONG TERM GOAL #3   Title Patient will report at least 50% improvement in symptoms for  improved quality of life.    Baseline 8/10 back pain, self report of 10 min standing tolerance    Time 6    Period Weeks    Status Achieved      PT LONG TERM GOAL #4   Title Patient will report at least 75% improvement in symptoms for improved QoL.    Time 4    Period Weeks    Status New    Target Date 03/27/21                   Plan - 03/06/21 1312     Clinical Impression Statement Patient showing improving endurance and activity tolerance. Patient requires intermittent cueing for sequencing and reducing UE support throughout session with good carry over. Patient with fatigue requiring several seated rest breaks. Patient will continue to benefit from skilled physical therapy in order to reduce impairment and improve function.    Personal Factors and Comorbidities Age;Comorbidity 3+;Time since onset of injury/illness/exacerbation    Comorbidities multiple comorbid conditions    Examination-Activity Limitations Bend;Carry;Lift;Stand;Stairs;Squat;Locomotion Level;Transfers    Examination-Participation Restrictions Church;Community Activity;Laundry;Meal Prep    Stability/Clinical Decision Making Evolving/Moderate complexity    Rehab Potential Good    PT Frequency 2x / week    PT Duration 6 weeks    PT Treatment/Interventions ADLs/Self Care Home Management;Electrical Stimulation;DME Instruction;Traction;Gait training;Stair training;Functional mobility training;Therapeutic activities;Therapeutic exercise;Balance training;Patient/family education;Neuromuscular re-education;Manual techniques;Passive range of motion;Spinal Manipulations;Joint Manipulations;Taping    PT Next Visit Plan Continue with BLE strengthening, core stabilization    PT Home Exercise Plan QS, hip add isometric, SLR 6/22 HR, hip abd standing at sink. PPT, SKTC, DKTC    Consulted and Agree with Plan of Care Patient             Patient will benefit from skilled therapeutic intervention in order to improve the  following deficits and impairments:  Abnormal gait, Decreased activity tolerance, Decreased balance, Decreased mobility, Decreased endurance, Decreased strength, Difficulty walking, Postural dysfunction, Improper body mechanics, Pain  Visit Diagnosis: Chronic midline low back pain, unspecified whether sciatica present  Muscle weakness (generalized)  Difficulty in walking, not elsewhere classified     Problem List Patient Active Problem List   Diagnosis Date Noted   Acute pulmonary embolism ([place]) 01/13/2018   Chest pain 01/11/2018   Elevated troponin 01/11/2018   AKI (acute kidney injury) ([place]) 01/11/2018   NSTEMI (non-ST elevated myocardial infarction) ([place]) 01/11/2018   Vascular headache    Seizure prophylaxis    Weakness of left third cranial nerve    Gait disturbance, post-stroke 11/22/2016   Benign essential HTN    Seizure disorder ([place])    Acute blood loss anemia    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   Bilateral leg pain 06/26/2016   AAA (abdominal aortic aneurysm) without rupture ([place]) 06/26/2016   Insomnia 06/07/2015   Urinary incontinence 06/07/2015   Chronic pain syndrome 03/04/2015   Fibromyalgia 03/04/2015   Cervical radiculitis 03/04/2015   Lumbar radicular pain 03/04/2015   Major depression 07/05/2014   Claudication, class I ([place]) 03/18/2013   Smoker unmotivated to quit     1:51 PM, 03/06/21 Mearl Latin PT, DPT Physical Therapist at [place], [place], [address] Phone: [phone]   Fax:  [phone]  Name: Jennifer Jacobs MRN:  [id number] Date of Birth: [birth year]

## 2021-03-08 ENCOUNTER — Other Ambulatory Visit: Payer: Self-pay

## 2021-03-08 ENCOUNTER — Ambulatory Visit (HOSPITAL_COMMUNITY): Payer: Medicare HMO | Admitting: Physical Therapy

## 2021-03-08 ENCOUNTER — Encounter (HOSPITAL_COMMUNITY): Payer: Self-pay | Admitting: Physical Therapy

## 2021-03-08 DIAGNOSIS — M25511 Pain in right shoulder: Secondary | ICD-10-CM | POA: Diagnosis not present

## 2021-03-08 DIAGNOSIS — M6281 Muscle weakness (generalized): Secondary | ICD-10-CM

## 2021-03-08 DIAGNOSIS — R262 Difficulty in walking, not elsewhere classified: Secondary | ICD-10-CM

## 2021-03-08 DIAGNOSIS — G8929 Other chronic pain: Secondary | ICD-10-CM | POA: Diagnosis not present

## 2021-03-08 DIAGNOSIS — R29898 Other symptoms and signs involving the musculoskeletal system: Secondary | ICD-10-CM | POA: Diagnosis not present

## 2021-03-08 DIAGNOSIS — M545 Low back pain, unspecified: Secondary | ICD-10-CM

## 2021-03-08 DIAGNOSIS — M25512 Pain in left shoulder: Secondary | ICD-10-CM | POA: Diagnosis not present

## 2021-03-08 NOTE — Therapy (Signed)
[place], [place], [address] Phone: [phone]   Fax:  [phone]  Physical Therapy Treatment  Patient Details  Name: Jennifer Jacobs MRN: [id number] Date of Birth: August 07, 1946 Referring Provider (PT): Carole Civil, MD   Encounter Date: 03/08/2021   PT End of Session - 03/08/21 1314     Visit Number 11    Number of Visits 20    Date for PT Re-Evaluation 03/27/21    Authorization Type [employer] HMO, auth required, visit limit MCR guidelines    Authorization Time Period 12 visits approved 6/17-->03/17/21; 12 visits approved 7/11-8/8    Authorization - Visit Number 2    Authorization - Number of Visits 8    Progress Note Due on Visit 19    PT Start Time 1315    PT Stop Time 1353    PT Time Calculation (min) 38 min    Activity Tolerance Patient tolerated treatment well;No increased pain    Behavior During Therapy WFL for tasks assessed/performed             Past Medical History:  Diagnosis Date   Acute blood loss anemia    AKI (acute kidney injury) ([place]) 01/11/2018   Alcohol abuse 07/05/2014   Allergy    Anxiety    Anxiety and depression    Arthritis    Cognitive deficit due to recent stroke 11/22/2016   COPD (chronic obstructive pulmonary disease) ([place])    Depression    Dyslipidemia    Fibromyalgia    Hepatic cyst    Hyperlipidemia    Hypertension    Leukocytosis    Liver cyst 03/18/2014   01/26/2014 ultrasound Hypoechoic structure adjacent to gallbladder right lobe liver,  measuring 29 x 18 x 17 mm, possibly septated cyst     MI (myocardial infarction) ([place]) 2007   By patient report, not substantiated by recent nuclear stress test.   Neuromuscular disorder ([place])    Seizure disorder ([place])    none since 2002   Seizures ([place])    Smoker unmotivated to quit    Quit for 3 years and then restarted   Substance abuse ([place])    ETOH   Vision abnormalities     Past Surgical History:   Procedure Laterality Date   CATARACT EXTRACTION W/PHACO Right 06/30/2018   Procedure: CATARACT EXTRACTION PHACO AND INTRAOCULAR LENS PLACEMENT RIGHT EYE;  Surgeon: Tonny Branch, MD;  Location: [facility];  Service: Ophthalmology;  Laterality: Right;  CDE: 9.90   CATARACT EXTRACTION W/PHACO Left 07/14/2018   Procedure: CATARACT EXTRACTION PHACO AND INTRAOCULAR LENS PLACEMENT (IOC);  Surgeon: Tonny Branch, MD;  Location: [facility];  Service: Ophthalmology;  Laterality: Left;  CDE: 6.96   CRANIOTOMY Left 11/19/2016   Procedure: CRANIOTOMY INTRACRANIAL FOR  ANEURYSM CLIPPING;  Surgeon: Kevan Ny Ditty, MD;  Location: [facility];  Service: Neurosurgery;  Laterality: Left;   ELBOW ARTHROPLASTY Left    rods   Exercise tolerance test  04/27/2013   CPET-MET: Submaximal effort (0.96, with goal of greater than 1.09) -- patient states that her effort was limited due to knee pain.  This makes the rest of the interpretation difficult: Peak VO2 - 10.5 (59% moderate), peak 02-pulse -  7..11 (low); heart rate 114 (73% -chronic incompetence considered);; PFTs normal    MOUTH SURGERY     NM MYOVIEW LTD  05/19/2013   LexiScan: EF 80%, no evidence of ischemia or infarction   TRANSTHORACIC ECHOCARDIOGRAM  03/25/2013  EF 55-60%, no regional wall motion abnormalities; normal diastolic parameters, normal pulmonary pressures.  No valvular lesions noted.  Essentially normal echo     There were no vitals filed for this visit.   Subjective Assessment - 03/08/21 1316     Subjective Patient states she just woke up and is just getting moving.    Diagnostic tests x-ray Facet arthritis L4 and L5 spondylolisthesis grade 1 spondylosis L4-S1    Patient Stated Goals "Get my back to stop hurting and not fall down so much"    Currently in Pain? No/denies    Pain Onset More than a month ago                               [facility] Adult PT Treatment/Exercise - 03/08/21 0001       Lumbar Exercises: Aerobic   Recumbent  Bike 5 min level 4 for dynamic warm-up      Lumbar Exercises: Standing   Row 10 reps;Both    Theraband Level (Row) Level 4 (Blue)    Row Limitations 2 sets    Shoulder Extension Both;10 reps    Theraband Level (Shoulder Extension) Level 4 (Blue)    Shoulder Extension Limitations 2 sets      Knee/Hip Exercises: Standing   Hip Abduction Stengthening;Both;3 sets;10 reps    Abduction Limitations 3#    Lateral Step Up Both;2 sets;10 reps;Hand Hold: 2;Step Height: 6"    Forward Step Up Both;10 reps;Hand Hold: 2;3 sets;Step Height: 6"      Knee/Hip Exercises: Seated   Long Arc Quad AROM;Both;10 reps    Long Arc Quad Weight 3 lbs.    Long [employer] Limitations 5 second holds    Sit to [employer] 15 reps;without UE support                    PT Education - 03/08/21 1315     Education Details HEP    Person(s) Educated Patient    Methods Explanation;Demonstration    Comprehension Verbalized understanding;Returned demonstration              PT Short Term Goals - 02/27/21 1330       PT SHORT TERM GOAL #1   Title Patient will be independent with HEP in order to improve functional outcomes.    Time 3    Period Weeks    Status Achieved    Target Date 02/24/21      PT SHORT TERM GOAL #2   Title Patient will report at least 25% improvement in symptoms for improved quality of life.    Baseline 50%    Time 3    Period Weeks    Status Achieved    Target Date 02/24/21      PT SHORT TERM GOAL #3   Title Demonstrate improved gait speed and tolerance as evidenced by distanc of 80 ft during 2MWT    Baseline 40 ft with RW    Time 3    Period Weeks    Status Achieved    Target Date 02/24/21               PT Long Term Goals - 02/27/21 1334       PT LONG TERM GOAL #1   Title Patient will improve FOTO score by at least 10 points in order to indicate improved tolerance to activity    Baseline 39% function    Time  6    Period Weeks    Status Achieved      PT LONG  TERM GOAL #2   Title Demo gross 4/5 BLE strength to improve activity tolerance    Baseline 2+ to 4+/5 BLE    Time 6    Period Weeks    Status On-going      PT LONG TERM GOAL #3   Title Patient will report at least 50% improvement in symptoms for improved quality of life.    Baseline 8/10 back pain, self report of 10 min standing tolerance    Time 6    Period Weeks    Status Achieved      PT LONG TERM GOAL #4   Title Patient will report at least 75% improvement in symptoms for improved QoL.    Time 4    Period Weeks    Status New    Target Date 03/27/21                   Plan - 03/08/21 1315     Clinical Impression Statement Patient tolerates increased resistance on bike today with fatigue following requiring rest break. Patient able to complete step up with increased height today without c/o symptoms showing improving LE strength. Patient requires intermittent cueing throughout session for mechanics of exercises. Patient requires several seated rest breaks during session. Patient will continue to benefit from skilled physical therapy in order to reduce impairment and improve function.    Personal Factors and Comorbidities Age;Comorbidity 3+;Time since onset of injury/illness/exacerbation    Comorbidities multiple comorbid conditions    Examination-Activity Limitations Bend;Carry;Lift;Stand;Stairs;Squat;Locomotion Level;Transfers    Examination-Participation Restrictions Church;Community Activity;Laundry;Meal Prep    Stability/Clinical Decision Making Evolving/Moderate complexity    Rehab Potential Good    PT Frequency 2x / week    PT Duration 6 weeks    PT Treatment/Interventions ADLs/Self Care Home Management;Electrical Stimulation;DME Instruction;Traction;Gait training;Stair training;Functional mobility training;Therapeutic activities;Therapeutic exercise;Balance training;Patient/family education;Neuromuscular re-education;Manual techniques;Passive range of motion;Spinal  Manipulations;Joint Manipulations;Taping    PT Next Visit Plan Continue with BLE strengthening, core stabilization    PT Home Exercise Plan QS, hip add isometric, SLR 6/22 HR, hip abd standing at sink. PPT, SKTC, DKTC    Consulted and Agree with Plan of Care Patient             Patient will benefit from skilled therapeutic intervention in order to improve the following deficits and impairments:  Abnormal gait, Decreased activity tolerance, Decreased balance, Decreased mobility, Decreased endurance, Decreased strength, Difficulty walking, Postural dysfunction, Improper body mechanics, Pain  Visit Diagnosis: Chronic midline low back pain, unspecified whether sciatica present  Muscle weakness (generalized)  Difficulty in walking, not elsewhere classified     Problem List Patient Active Problem List   Diagnosis Date Noted   Acute pulmonary embolism ([place]) 01/13/2018   Chest pain 01/11/2018   Elevated troponin 01/11/2018   AKI (acute kidney injury) ([place]) 01/11/2018   NSTEMI (non-ST elevated myocardial infarction) ([place]) 01/11/2018   Vascular headache    Seizure prophylaxis    Weakness of left third cranial nerve    Gait disturbance, post-stroke 11/22/2016   Benign essential HTN    Seizure disorder ([place])    Acute blood loss anemia    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   Bilateral leg pain 06/26/2016   AAA (abdominal aortic aneurysm) without rupture ([place]) 06/26/2016   Insomnia 06/07/2015   Urinary incontinence 06/07/2015   Chronic pain syndrome 03/04/2015   Fibromyalgia 03/04/2015  Cervical radiculitis 03/04/2015   Lumbar radicular pain 03/04/2015   Major depression 07/05/2014   Claudication, class I ([place]) 03/18/2013   Smoker unmotivated to quit     1:52 PM, 03/08/21 Mearl Latin PT, DPT Physical Therapist at [place], [place],  [address] Phone: [phone]   Fax:  [phone]  Name: Jennifer Jacobs MRN: [id number] Date of Birth: 05/29/1946

## 2021-03-10 ENCOUNTER — Encounter (HOSPITAL_COMMUNITY): Payer: Medicare HMO

## 2021-03-14 ENCOUNTER — Ambulatory Visit (HOSPITAL_COMMUNITY): Payer: Medicare HMO | Admitting: Physical Therapy

## 2021-03-15 ENCOUNTER — Other Ambulatory Visit: Payer: Self-pay

## 2021-03-15 ENCOUNTER — Ambulatory Visit (HOSPITAL_COMMUNITY): Payer: Medicare HMO | Admitting: Physical Therapy

## 2021-03-15 DIAGNOSIS — R262 Difficulty in walking, not elsewhere classified: Secondary | ICD-10-CM

## 2021-03-15 DIAGNOSIS — M6281 Muscle weakness (generalized): Secondary | ICD-10-CM

## 2021-03-15 DIAGNOSIS — R29898 Other symptoms and signs involving the musculoskeletal system: Secondary | ICD-10-CM | POA: Diagnosis not present

## 2021-03-15 DIAGNOSIS — M545 Low back pain, unspecified: Secondary | ICD-10-CM | POA: Diagnosis not present

## 2021-03-15 DIAGNOSIS — G8929 Other chronic pain: Secondary | ICD-10-CM

## 2021-03-15 DIAGNOSIS — M25512 Pain in left shoulder: Secondary | ICD-10-CM | POA: Diagnosis not present

## 2021-03-15 DIAGNOSIS — M25511 Pain in right shoulder: Secondary | ICD-10-CM | POA: Diagnosis not present

## 2021-03-15 NOTE — Therapy (Signed)
[place], [place], [address] Phone: [phone]   Fax:  [phone]  Physical Therapy Treatment  Patient Details  Name: Jennifer Jacobs MRN: [id number] Date of Birth: Sep 28, 1945 Referring Provider (PT): Carole Civil, MD   Encounter Date: 03/15/2021   PT End of Session - 03/15/21 1043     Visit Number 12    Number of Visits 20    Date for PT Re-Evaluation 03/27/21    Authorization Type [employer] H[employer], auth required, visit limit MCR guidelines    Authorization Time Period 12 visits approved 6/17-->03/17/21; 12 visits approved 7/11-8/8    Authorization - Visit Number 3    Authorization - Number of Visits 8    Progress Note Due on Visit 19    PT Start Time 6151    PT Stop Time 1115    PT Time Calculation (min) 40 min    Activity Tolerance Patient tolerated treatment well;No increased pain    Behavior During Therapy WFL for tasks assessed/performed             Past Medical History:  Diagnosis Date   Acute blood loss anemia    AKI (acute kidney injury) ([place]) 01/11/2018   Alcohol abuse 07/05/2014   Allergy    Anxiety    Anxiety and depression    Arthritis    Cognitive deficit due to recent stroke 11/22/2016   COPD (chronic obstructive pulmonary disease) ([place])    Depression    Dyslipidemia    Fibromyalgia    Hepatic cyst    Hyperlipidemia    Hypertension    Leukocytosis    Liver cyst 03/18/2014   01/26/2014 ultrasound Hypoechoic structure adjacent to gallbladder right lobe liver,  measuring 29 x 18 x 17 mm, possibly septated cyst     MI (myocardial infarction) ([place]) 2007   By patient report, not substantiated by recent nuclear stress test.   Neuromuscular disorder ([place])    Seizure disorder ([place])    none since 2002   Seizures ([place])    Smoker unmotivated to quit    Quit for 3 years and then restarted   Substance abuse ([place])    ETOH   Vision abnormalities     Past Surgical History:   Procedure Laterality Date   CATARACT EXTRACTION W/PHACO Right 06/30/2018   Procedure: CATARACT EXTRACTION PHACO AND INTRAOCULAR LENS PLACEMENT RIGHT EYE;  Surgeon: Tonny Branch, MD;  Location: [facility];  Service: Ophthalmology;  Laterality: Right;  CDE: 9.90   CATARACT EXTRACTION W/PHACO Left 07/14/2018   Procedure: CATARACT EXTRACTION PHACO AND INTRAOCULAR LENS PLACEMENT (IOC);  Surgeon: Tonny Branch, MD;  Location: [facility];  Service: Ophthalmology;  Laterality: Left;  CDE: 6.96   CRANIOTOMY Left 11/19/2016   Procedure: CRANIOTOMY INTRACRANIAL FOR  ANEURYSM CLIPPING;  Surgeon: Kevan Ny Ditty, MD;  Location: [facility];  Service: Neurosurgery;  Laterality: Left;   ELBOW ARTHROPLASTY Left    rods   Exercise tolerance test  04/27/2013   CPET-MET: Submaximal effort (0.96, with goal of greater than 1.09) -- patient states that her effort was limited due to knee pain.  This makes the rest of the interpretation difficult: Peak VO2 - 10.5 (59% moderate), peak 02-pulse -  7..11 (low); heart rate 114 (73% -chronic incompetence considered);; PFTs normal    MOUTH SURGERY     NM MYOVIEW LTD  05/19/2013   LexiScan: EF 80%, no evidence of ischemia or infarction   TRANSTHORACIC ECHOCARDIOGRAM  03/25/2013  EF 55-60%, no regional wall motion abnormalities; normal diastolic parameters, normal pulmonary pressures.  No valvular lesions noted.  Essentially normal echo     There were no vitals filed for this visit.   Subjective Assessment - 03/15/21 1042     Subjective Patient says she is doing bad today. She feels dizzy. Wants to check her BP, didn't tale her medications last night. She also says she has been having diarrhea. Her hip and back hurt. She hasn't been checking her BP regularly.    Diagnostic tests x-ray Facet arthritis L4 and L5 spondylolisthesis grade 1 spondylosis L4-S1    Patient Stated Goals "Get my back to stop hurting and not fall down so much"    Currently in Pain? Yes    Pain Score 5     Pain  Location Back    Pain Orientation Lower;Right;Posterior    Pain Descriptors / Indicators Aching;Dull;Sharp    Pain Type Chronic pain    Pain Onset More than a month ago    Pain Frequency Constant                               [facility] Adult PT Treatment/Exercise - 03/15/21 0001       Lumbar Exercises: Stretches   Single Knee to Chest Stretch --    Double Knee to Chest Stretch 5 reps;10 seconds    Lower Trunk Rotation 5 reps;10 seconds      Lumbar Exercises: Supine   Ab Set 5 seconds;15 reps    Glut Set 5 seconds;15 reps    Bridge 5 seconds;20 reps   2 sets of 10   Straight Leg Raise 15 reps    Straight Leg Raises Limitations with ab set                      PT Short Term Goals - 02/27/21 1330       PT SHORT TERM GOAL #1   Title Patient will be independent with HEP in order to improve functional outcomes.    Time 3    Period Weeks    Status Achieved    Target Date 02/24/21      PT SHORT TERM GOAL #2   Title Patient will report at least 25% improvement in symptoms for improved quality of life.    Baseline 50%    Time 3    Period Weeks    Status Achieved    Target Date 02/24/21      PT SHORT TERM GOAL #3   Title Demonstrate improved gait speed and tolerance as evidenced by distanc of 80 ft during 2MWT    Baseline 40 ft with RW    Time 3    Period Weeks    Status Achieved    Target Date 02/24/21               PT Long Term Goals - 02/27/21 1334       PT LONG TERM GOAL #1   Title Patient will improve FOTO score by at least 10 points in order to indicate improved tolerance to activity    Baseline 39% function    Time 6    Period Weeks    Status Achieved      PT LONG TERM GOAL #2   Title Demo gross 4/5 BLE strength to improve activity tolerance    Baseline 2+ to 4+/5 BLE    Time 6  Period Weeks    Status On-going      PT LONG TERM GOAL #3   Title Patient will report at least 50% improvement in symptoms for improved  quality of life.    Baseline 8/10 back pain, self report of 10 min standing tolerance    Time 6    Period Weeks    Status Achieved      PT LONG TERM GOAL #4   Title Patient will report at least 75% improvement in symptoms for improved QoL.    Time 4    Period Weeks    Status New    Target Date 03/27/21                   Plan - 03/15/21 1111     Clinical Impression Statement Assessed patient BP upon arrival reading 117/90 in seated position. Discussed with patient if she is feeling unwell we can hold therapy today and to seek medical attention as needed. Patient stating she feels fine now and would like to proceed with session. Graded activity per patient response. Discussed possibility of slight dehydration causing some of reported symptoms as patient says she has not had any this morning other than a few sips with her medication. Offered patient water. Patient tolerated session well overall. Focused on core and postural strengthening exercises on mat. Monitored patient response throughout session.    Personal Factors and Comorbidities Age;Comorbidity 3+;Time since onset of injury/illness/exacerbation    Comorbidities multiple comorbid conditions    Examination-Activity Limitations Bend;Carry;Lift;Stand;Stairs;Squat;Locomotion Level;Transfers    Examination-Participation Restrictions Church;Community Activity;Laundry;Meal Prep    Stability/Clinical Decision Making Evolving/Moderate complexity    Rehab Potential Good    PT Frequency 2x / week    PT Duration 6 weeks    PT Treatment/Interventions ADLs/Self Care Home Management;Electrical Stimulation;DME Instruction;Traction;Gait training;Stair training;Functional mobility training;Therapeutic activities;Therapeutic exercise;Balance training;Patient/family education;Neuromuscular re-education;Manual techniques;Passive range of motion;Spinal Manipulations;Joint Manipulations;Taping    PT Next Visit Plan Continue with BLE strengthening,  core stabilization. Resume prior activity as tolerated    PT Home Exercise Plan QS, hip add isometric, SLR 6/22 HR, hip abd standing at sink. PPT, SKTC, DKTC    Consulted and Agree with Plan of Care Patient             Patient will benefit from skilled therapeutic intervention in order to improve the following deficits and impairments:  Abnormal gait, Decreased activity tolerance, Decreased balance, Decreased mobility, Decreased endurance, Decreased strength, Difficulty walking, Postural dysfunction, Improper body mechanics, Pain  Visit Diagnosis: Chronic midline low back pain, unspecified whether sciatica present  Muscle weakness (generalized)  Difficulty in walking, not elsewhere classified     Problem List Patient Active Problem List   Diagnosis Date Noted   Acute pulmonary embolism ([place]) 01/13/2018   Chest pain 01/11/2018   Elevated troponin 01/11/2018   AKI (acute kidney injury) ([place]) 01/11/2018   NSTEMI (non-ST elevated myocardial infarction) ([place]) 01/11/2018   Vascular headache    Seizure prophylaxis    Weakness of left third cranial nerve    Gait disturbance, post-stroke 11/22/2016   Benign essential HTN    Seizure disorder ([place])    Acute blood loss anemia    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   Bilateral leg pain 06/26/2016   AAA (abdominal aortic aneurysm) without rupture ([place]) 06/26/2016   Insomnia 06/07/2015   Urinary incontinence 06/07/2015   Chronic pain syndrome 03/04/2015   Fibromyalgia 03/04/2015   Cervical radiculitis 03/04/2015   Lumbar radicular pain 03/04/2015  Major depression 07/05/2014   Claudication, class I ([place]) 03/18/2013   Smoker unmotivated to quit    11:14 AM, 03/15/21 Josue Hector PT DPT  Physical Therapist with [facility]  [phone] [place], [place], [address] Phone: [phone]   Fax:   [phone]  Name: LAKETA SANDOZ MRN: [id number] Date of Birth: July 18, 1946

## 2021-03-19 DIAGNOSIS — D649 Anemia, unspecified: Secondary | ICD-10-CM | POA: Diagnosis not present

## 2021-03-19 DIAGNOSIS — I10 Essential (primary) hypertension: Secondary | ICD-10-CM | POA: Diagnosis not present

## 2021-03-21 ENCOUNTER — Ambulatory Visit (HOSPITAL_COMMUNITY): Payer: Medicare HMO | Attending: Orthopedic Surgery | Admitting: Physical Therapy

## 2021-03-21 ENCOUNTER — Other Ambulatory Visit: Payer: Self-pay

## 2021-03-21 DIAGNOSIS — M6281 Muscle weakness (generalized): Secondary | ICD-10-CM | POA: Diagnosis not present

## 2021-03-21 DIAGNOSIS — R262 Difficulty in walking, not elsewhere classified: Secondary | ICD-10-CM | POA: Insufficient documentation

## 2021-03-21 DIAGNOSIS — M545 Low back pain, unspecified: Secondary | ICD-10-CM | POA: Insufficient documentation

## 2021-03-21 DIAGNOSIS — G8929 Other chronic pain: Secondary | ICD-10-CM | POA: Insufficient documentation

## 2021-03-21 NOTE — Therapy (Signed)
[place], [place], [address] Phone: [phone]   Fax:  [phone]  Physical Therapy Treatment  Patient Details  Name: Jennifer Jacobs MRN: [id number] Date of Birth: 10-02-1945 Referring Provider (PT): Carole Civil, MD   Encounter Date: 03/21/2021   PT End of Session - 03/21/21 1313     Visit Number 13    Number of Visits 20    Date for PT Re-Evaluation 03/27/21    Authorization Type [employer] HMO, auth required, visit limit MCR guidelines    Authorization Time Period 12 visits approved 6/17-->03/17/21; 12 visits approved 7/11-8/8    Authorization - Visit Number 4    Authorization - Number of Visits 8    Progress Note Due on Visit 19    PT Start Time 1314    PT Stop Time 1355    PT Time Calculation (min) 41 min    Activity Tolerance Patient tolerated treatment well;No increased pain    Behavior During Therapy WFL for tasks assessed/performed             Past Medical History:  Diagnosis Date   Acute blood loss anemia    AKI (acute kidney injury) ([place]) 01/11/2018   Alcohol abuse 07/05/2014   Allergy    Anxiety    Anxiety and depression    Arthritis    Cognitive deficit due to recent stroke 11/22/2016   COPD (chronic obstructive pulmonary disease) ([place])    Depression    Dyslipidemia    Fibromyalgia    Hepatic cyst    Hyperlipidemia    Hypertension    Leukocytosis    Liver cyst 03/18/2014   01/26/2014 ultrasound Hypoechoic structure adjacent to gallbladder right lobe liver,  measuring 29 x 18 x 17 mm, possibly septated cyst     MI (myocardial infarction) ([place]) 2007   By patient report, not substantiated by recent nuclear stress test.   Neuromuscular disorder ([place])    Seizure disorder ([place])    none since 2002   Seizures ([place])    Smoker unmotivated to quit    Quit for 3 years and then restarted   Substance abuse ([place])    ETOH   Vision abnormalities     Past Surgical History:   Procedure Laterality Date   CATARACT EXTRACTION W/PHACO Right 06/30/2018   Procedure: CATARACT EXTRACTION PHACO AND INTRAOCULAR LENS PLACEMENT RIGHT EYE;  Surgeon: Tonny Branch, MD;  Location: [facility];  Service: Ophthalmology;  Laterality: Right;  CDE: 9.90   CATARACT EXTRACTION W/PHACO Left 07/14/2018   Procedure: CATARACT EXTRACTION PHACO AND INTRAOCULAR LENS PLACEMENT (IOC);  Surgeon: Tonny Branch, MD;  Location: [facility];  Service: Ophthalmology;  Laterality: Left;  CDE: 6.96   CRANIOTOMY Left 11/19/2016   Procedure: CRANIOTOMY INTRACRANIAL FOR  ANEURYSM CLIPPING;  Surgeon: Kevan Ny Ditty, MD;  Location: [facility];  Service: Neurosurgery;  Laterality: Left;   ELBOW ARTHROPLASTY Left    rods   Exercise tolerance test  04/27/2013   CPET-MET: Submaximal effort (0.96, with goal of greater than 1.09) -- patient states that her effort was limited due to knee pain.  This makes the rest of the interpretation difficult: Peak VO2 - 10.5 (59% moderate), peak 02-pulse -  7..11 (low); heart rate 114 (73% -chronic incompetence considered);; PFTs normal    MOUTH SURGERY     NM MYOVIEW LTD  05/19/2013   LexiScan: EF 80%, no evidence of ischemia or infarction   TRANSTHORACIC ECHOCARDIOGRAM  03/25/2013  EF 55-60%, no regional wall motion abnormalities; normal diastolic parameters, normal pulmonary pressures.  No valvular lesions noted.  Essentially normal echo     There were no vitals filed for this visit.   Subjective Assessment - 03/21/21 1317     Subjective pt reports weakness in Lt LE but no pain.  Reports she completes her HEP approx 3X a week but not daily.    Currently in Pain? No/denies                               [facility] Adult PT Treatment/Exercise - 03/21/21 0001       Lumbar Exercises: Stretches   Single Knee to Chest Stretch 5 reps;20 seconds;Right;Left    Lower Trunk Rotation 5 reps;20 seconds      Lumbar Exercises: Standing   Row 10 reps;Both    Theraband Level  (Row) Level 3 (Green)    Shoulder Extension Both;10 reps    Theraband Level (Shoulder Extension) Level 3 (Green)      Lumbar Exercises: Supine   Ab Set 15 reps;5 reps    Bridge Limitations;20 reps    Bridge Limitations 2 sets; slow controlled    Straight Leg Raise 20 reps;Limitations    Straight Leg Raises Limitations 2 sets of 10      Lumbar Exercises: Sidelying   Clam Both;10 reps;5 seconds    Hip Abduction Both;10 reps      Knee/Hip Exercises: Seated   Sit to Sand 10 reps;without UE support      Shoulder Exercises: Seated   Extension --    Retraction --    Row --    Theraband Level (Shoulder Row) --                      PT Short Term Goals - 02/27/21 1330       PT SHORT TERM GOAL #1   Title Patient will be independent with HEP in order to improve functional outcomes.    Time 3    Period Weeks    Status Achieved    Target Date 02/24/21      PT SHORT TERM GOAL #2   Title Patient will report at least 25% improvement in symptoms for improved quality of life.    Baseline 50%    Time 3    Period Weeks    Status Achieved    Target Date 02/24/21      PT SHORT TERM GOAL #3   Title Demonstrate improved gait speed and tolerance as evidenced by distanc of 80 ft during 2MWT    Baseline 40 ft with RW    Time 3    Period Weeks    Status Achieved    Target Date 02/24/21               PT Long Term Goals - 02/27/21 1334       PT LONG TERM GOAL #1   Title Patient will improve FOTO score by at least 10 points in order to indicate improved tolerance to activity    Baseline 39% function    Time 6    Period Weeks    Status Achieved      PT LONG TERM GOAL #2   Title Demo gross 4/5 BLE strength to improve activity tolerance    Baseline 2+ to 4+/5 BLE    Time 6    Period Weeks    Status  On-going      PT LONG TERM GOAL #3   Title Patient will report at least 50% improvement in symptoms for improved quality of life.    Baseline 8/10 back pain, self  report of 10 min standing tolerance    Time 6    Period Weeks    Status Achieved      PT LONG TERM GOAL #4   Title Patient will report at least 75% improvement in symptoms for improved QoL.    Time 4    Period Weeks    Status New    Target Date 03/27/21                   Plan - 03/21/21 1401     Clinical Impression Statement Continued wit LE strengthening exercises. Increased most exercises to 2 sets with no reports of pain completing these today.  Pt requires VCs for completing exercises slowly, keeping count of repetitions while also maintain correct technique/form.   Pt able to complete bed mobility without use of logroll technique and without any pain noted.  Added sit to stands with poor eccentric control exhibited without use of UE's.  Resumed theraband exercises in standing with cues to complete slowly and controlled.  Pt required seated rest break prior to leaving clinic due to fatigue at EOS.    Personal Factors and Comorbidities Age;Comorbidity 3+;Time since onset of injury/illness/exacerbation    Comorbidities multiple comorbid conditions    Examination-Activity Limitations Bend;Carry;Lift;Stand;Stairs;Squat;Locomotion Level;Transfers    Examination-Participation Restrictions Church;Community Activity;Laundry;Meal Prep    Stability/Clinical Decision Making Evolving/Moderate complexity    Rehab Potential Good    PT Frequency 2x / week    PT Duration 6 weeks    PT Treatment/Interventions ADLs/Self Care Home Management;Electrical Stimulation;DME Instruction;Traction;Gait training;Stair training;Functional mobility training;Therapeutic activities;Therapeutic exercise;Balance training;Patient/family education;Neuromuscular re-education;Manual techniques;Passive range of motion;Spinal Manipulations;Joint Manipulations;Taping    PT Next Visit Plan Continue with BLE strengthening, core stabilization. Resume prior activity as tolerated.  Add piriformis stretch    PT Home  Exercise Plan QS, hip add isometric, SLR 6/22 HR, hip abd standing at sink. PPT, SKTC, DKTC    Consulted and Agree with Plan of Care Patient             Patient will benefit from skilled therapeutic intervention in order to improve the following deficits and impairments:  Abnormal gait, Decreased activity tolerance, Decreased balance, Decreased mobility, Decreased endurance, Decreased strength, Difficulty walking, Postural dysfunction, Improper body mechanics, Pain  Visit Diagnosis: Chronic midline low back pain, unspecified whether sciatica present  Muscle weakness (generalized)  Difficulty in walking, not elsewhere classified     Problem List Patient Active Problem List   Diagnosis Date Noted   Acute pulmonary embolism ([place]) 01/13/2018   Chest pain 01/11/2018   Elevated troponin 01/11/2018   AKI (acute kidney injury) ([place]) 01/11/2018   NSTEMI (non-ST elevated myocardial infarction) ([place]) 01/11/2018   Vascular headache    Seizure prophylaxis    Weakness of left third cranial nerve    Gait disturbance, post-stroke 11/22/2016   Benign essential HTN    Seizure disorder ([place])    Acute blood loss anemia    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   Bilateral leg pain 06/26/2016   AAA (abdominal aortic aneurysm) without rupture ([place]) 06/26/2016   Insomnia 06/07/2015   Urinary incontinence 06/07/2015   Chronic pain syndrome 03/04/2015   Fibromyalgia 03/04/2015   Cervical radiculitis 03/04/2015   Lumbar radicular pain 03/04/2015   Major depression 07/05/2014  Claudication, class I ([place]) 03/18/2013   Smoker unmotivated to quit    Teena Irani, PTA/CLT [phone]  Teena Irani 03/21/2021, 2:02 PM  [facility] [address] [place], [place], [address] Phone: [phone]   Fax:  [phone]  Name: Jennifer Jacobs MRN: [id number] Date of Birth: 26-Nov-1945

## 2021-03-24 ENCOUNTER — Other Ambulatory Visit: Payer: Self-pay

## 2021-03-24 ENCOUNTER — Ambulatory Visit (HOSPITAL_COMMUNITY): Payer: Medicare HMO

## 2021-03-24 DIAGNOSIS — G8929 Other chronic pain: Secondary | ICD-10-CM

## 2021-03-24 DIAGNOSIS — M545 Low back pain, unspecified: Secondary | ICD-10-CM | POA: Diagnosis not present

## 2021-03-24 DIAGNOSIS — M6281 Muscle weakness (generalized): Secondary | ICD-10-CM

## 2021-03-24 DIAGNOSIS — R262 Difficulty in walking, not elsewhere classified: Secondary | ICD-10-CM

## 2021-03-24 NOTE — Therapy (Signed)
[place], [place], [address] Phone: [phone]   Fax:  [phone]  Physical Therapy Treatment  Patient Details  Name: Jennifer Jacobs MRN: [id number] Date of Birth: 1946/06/24 Referring Provider (PT): Carole Civil, MD   Encounter Date: 03/24/2021   PT End of Session - 03/24/21 1559     Visit Number 14    Number of Visits 20    Date for PT Re-Evaluation 03/27/21    Authorization Type [employer] HMO, auth required, visit limit MCR guidelines    Authorization Time Period 12 visits approved 6/17-->03/17/21; 12 visits approved 7/11-8/8    Authorization - Visit Number 48    Authorization - Number of Visits 24    Progress Note Due on Visit 19    PT Start Time 1600    PT Stop Time 0254    PT Time Calculation (min) 45 min    Activity Tolerance Patient tolerated treatment well;No increased pain    Behavior During Therapy WFL for tasks assessed/performed             Past Medical History:  Diagnosis Date   Acute blood loss anemia    AKI (acute kidney injury) ([place]) 01/11/2018   Alcohol abuse 07/05/2014   Allergy    Anxiety    Anxiety and depression    Arthritis    Cognitive deficit due to recent stroke 11/22/2016   COPD (chronic obstructive pulmonary disease) ([place])    Depression    Dyslipidemia    Fibromyalgia    Hepatic cyst    Hyperlipidemia    Hypertension    Leukocytosis    Liver cyst 03/18/2014   01/26/2014 ultrasound Hypoechoic structure adjacent to gallbladder right lobe liver,  measuring 29 x 18 x 17 mm, possibly septated cyst     MI (myocardial infarction) ([place]) 2007   By patient report, not substantiated by recent nuclear stress test.   Neuromuscular disorder ([place])    Seizure disorder ([place])    none since 2002   Seizures ([place])    Smoker unmotivated to quit    Quit for 3 years and then restarted   Substance abuse ([place])    ETOH   Vision abnormalities     Past Surgical History:   Procedure Laterality Date   CATARACT EXTRACTION W/PHACO Right 06/30/2018   Procedure: CATARACT EXTRACTION PHACO AND INTRAOCULAR LENS PLACEMENT RIGHT EYE;  Surgeon: Tonny Branch, MD;  Location: [facility];  Service: Ophthalmology;  Laterality: Right;  CDE: 9.90   CATARACT EXTRACTION W/PHACO Left 07/14/2018   Procedure: CATARACT EXTRACTION PHACO AND INTRAOCULAR LENS PLACEMENT (IOC);  Surgeon: Tonny Branch, MD;  Location: [facility];  Service: Ophthalmology;  Laterality: Left;  CDE: 6.96   CRANIOTOMY Left 11/19/2016   Procedure: CRANIOTOMY INTRACRANIAL FOR  ANEURYSM CLIPPING;  Surgeon: Kevan Ny Ditty, MD;  Location: [facility];  Service: Neurosurgery;  Laterality: Left;   ELBOW ARTHROPLASTY Left    rods   Exercise tolerance test  04/27/2013   CPET-MET: Submaximal effort (0.96, with goal of greater than 1.09) -- patient states that her effort was limited due to knee pain.  This makes the rest of the interpretation difficult: Peak VO2 - 10.5 (59% moderate), peak 02-pulse -  7..11 (low); heart rate 114 (73% -chronic incompetence considered);; PFTs normal    MOUTH SURGERY     NM MYOVIEW LTD  05/19/2013   LexiScan: EF 80%, no evidence of ischemia or infarction   TRANSTHORACIC ECHOCARDIOGRAM  03/25/2013  EF 55-60%, no regional wall motion abnormalities; normal diastolic parameters, normal pulmonary pressures.  No valvular lesions noted.  Essentially normal echo     There were no vitals filed for this visit.   Subjective Assessment - 03/24/21 1558     Subjective Feeling better overall, notes some swelling in her lower RLE of unknown origin                               [facility] Adult PT Treatment/Exercise - 03/24/21 0001       Lumbar Exercises: Stretches   Single Knee to Chest Stretch Right;Left;2 reps;30 seconds    Double Knee to Chest Stretch 3 reps;30 seconds      Lumbar Exercises: Aerobic   Recumbent Bike 5 min level 4 for dynamic warm-up      Lumbar Exercises: Supine   Ab Set  20 reps;2 seconds    Bridge 20 reps;2 seconds    Bridge Limitations 2 sets; slow controlled    Straight Leg Raise 20 reps;2 seconds    Straight Leg Raises Limitations 2 sets of 10      Knee/Hip Exercises: Standing   Hip Abduction Stengthening;Both;3 sets;10 reps    Abduction Limitations 3#    Other Standing Knee Exercises sidestepping x 2 min 3 lbs    Other Standing Knee Exercises stair taps 6" box 3 lbs x2 min      Knee/Hip Exercises: Seated   Long Arc Quad Strengthening;Both;3 sets;10 reps    Long Arc Quad Weight 3 lbs.                      PT Short Term Goals - 02/27/21 1330       PT SHORT TERM GOAL #1   Title Patient will be independent with HEP in order to improve functional outcomes.    Time 3    Period Weeks    Status Achieved    Target Date 02/24/21      PT SHORT TERM GOAL #2   Title Patient will report at least 25% improvement in symptoms for improved quality of life.    Baseline 50%    Time 3    Period Weeks    Status Achieved    Target Date 02/24/21      PT SHORT TERM GOAL #3   Title Demonstrate improved gait speed and tolerance as evidenced by distanc of 80 ft during 2MWT    Baseline 40 ft with RW    Time 3    Period Weeks    Status Achieved    Target Date 02/24/21               PT Long Term Goals - 02/27/21 1334       PT LONG TERM GOAL #1   Title Patient will improve FOTO score by at least 10 points in order to indicate improved tolerance to activity    Baseline 39% function    Time 6    Period Weeks    Status Achieved      PT LONG TERM GOAL #2   Title Demo gross 4/5 BLE strength to improve activity tolerance    Baseline 2+ to 4+/5 BLE    Time 6    Period Weeks    Status On-going      PT LONG TERM GOAL #3   Title Patient will report at least 50% improvement in symptoms for improved quality  of life.    Baseline 8/10 back pain, self report of 10 min standing tolerance    Time 6    Period Weeks    Status Achieved      PT  LONG TERM GOAL #4   Title Patient will report at least 75% improvement in symptoms for improved QoL.    Time 4    Period Weeks    Status New    Target Date 03/27/21                   Plan - 03/24/21 1635     Clinical Impression Statement Tolerating exercise program well without adverse effects and demonstrating greater activity tolerance requiring less rest periods between exercises and increased repetition and resistance without issue. Continued POC indicated to progress core/pelvic strength to decrease LBP and improve functional activity tolerance.    Personal Factors and Comorbidities Age;Comorbidity 3+;Time since onset of injury/illness/exacerbation    Comorbidities multiple comorbid conditions    Examination-Activity Limitations Bend;Carry;Lift;Stand;Stairs;Squat;Locomotion Level;Transfers    Examination-Participation Restrictions Church;Community Activity;Laundry;Meal Prep    Stability/Clinical Decision Making Evolving/Moderate complexity    Rehab Potential Good    PT Frequency 2x / week    PT Duration 6 weeks    PT Treatment/Interventions ADLs/Self Care Home Management;Electrical Stimulation;DME Instruction;Traction;Gait training;Stair training;Functional mobility training;Therapeutic activities;Therapeutic exercise;Balance training;Patient/family education;Neuromuscular re-education;Manual techniques;Passive range of motion;Spinal Manipulations;Joint Manipulations;Taping    PT Next Visit Plan Continue with BLE strengthening, core stabilization. Resume prior activity as tolerated.  Add piriformis stretch    PT Home Exercise Plan QS, hip add isometric, SLR 6/22 HR, hip abd standing at sink. PPT, SKTC, DKTC    Consulted and Agree with Plan of Care Patient             Patient will benefit from skilled therapeutic intervention in order to improve the following deficits and impairments:  Abnormal gait, Decreased activity tolerance, Decreased balance, Decreased mobility,  Decreased endurance, Decreased strength, Difficulty walking, Postural dysfunction, Improper body mechanics, Pain  Visit Diagnosis: Chronic midline low back pain, unspecified whether sciatica present  Muscle weakness (generalized)  Difficulty in walking, not elsewhere classified     Problem List Patient Active Problem List   Diagnosis Date Noted   Acute pulmonary embolism ([place]) 01/13/2018   Chest pain 01/11/2018   Elevated troponin 01/11/2018   AKI (acute kidney injury) ([place]) 01/11/2018   NSTEMI (non-ST elevated myocardial infarction) ([place]) 01/11/2018   Vascular headache    Seizure prophylaxis    Weakness of left third cranial nerve    Gait disturbance, post-stroke 11/22/2016   Benign essential HTN    Seizure disorder ([place])    Acute blood loss anemia    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   Bilateral leg pain 06/26/2016   AAA (abdominal aortic aneurysm) without rupture ([facility] [place]) 06/26/2016   Insomnia 06/07/2015   Urinary incontinence 06/07/2015   Chronic pain syndrome 03/04/2015   Fibromyalgia 03/04/2015   Cervical radiculitis 03/04/2015   Lumbar radicular pain 03/04/2015   Major depression 07/05/2014   Claudication, class I ([place]) 03/18/2013   Smoker unmotivated to quit    4:40 PM, 03/24/21 M. Sherlyn Lees, PT, DPT Physical Therapist- [facility] Office Number: [phone]   [facility] [address] [place], [place], [address] Phone: [phone]   Fax:  [phone]  Name: Jennifer Jacobs MRN: [id number] Date of Birth: May 12, 1946

## 2021-03-28 ENCOUNTER — Ambulatory Visit (HOSPITAL_COMMUNITY): Payer: Medicare HMO | Admitting: Physical Therapy

## 2021-03-31 ENCOUNTER — Encounter (HOSPITAL_COMMUNITY): Payer: Medicare HMO

## 2021-04-04 ENCOUNTER — Other Ambulatory Visit: Payer: Self-pay

## 2021-04-04 ENCOUNTER — Ambulatory Visit (HOSPITAL_COMMUNITY): Payer: Medicare HMO | Admitting: Physical Therapy

## 2021-04-04 ENCOUNTER — Encounter (HOSPITAL_COMMUNITY): Payer: Self-pay | Admitting: Physical Therapy

## 2021-04-04 DIAGNOSIS — M6281 Muscle weakness (generalized): Secondary | ICD-10-CM

## 2021-04-04 DIAGNOSIS — M545 Low back pain, unspecified: Secondary | ICD-10-CM

## 2021-04-04 DIAGNOSIS — R262 Difficulty in walking, not elsewhere classified: Secondary | ICD-10-CM

## 2021-04-04 DIAGNOSIS — G8929 Other chronic pain: Secondary | ICD-10-CM | POA: Diagnosis not present

## 2021-04-04 NOTE — Therapy (Signed)
[facility] [address] [place], [place], [address] Phone: [phone]   Fax:  [phone]  Physical Therapy Treatment/progress note/recert  Patient Details  Name: Jennifer Jacobs MRN: [id number] Date of Birth: 1946-03-30 Referring Provider (PT): Carole Civil, MD   Encounter Date: 04/04/2021  Progress Note   Reporting Period 02/27/21 to 04/04/21   See note below for Objective Data and Assessment of Progress/Goals   PT End of Session - 04/04/21 1402     Visit Number 15    Number of Visits 28    Date for PT Re-Evaluation 05/02/21    Authorization Type [employer] HMO, auth required, visit limit MCR guidelines    Authorization Time Period 12 visits approved 6/17-->03/17/21; 12 visits approved 7/11-8/8; 8 visits requested 8/16-9/13    Authorization - Visit Number 0    Authorization - Number of Visits 8    Progress Note Due on Visit 29    PT Start Time 1402    PT Stop Time 1440    PT Time Calculation (min) 38 min    Activity Tolerance Patient tolerated treatment well;No increased pain    Behavior During Therapy WFL for tasks assessed/performed             Past Medical History:  Diagnosis Date   Acute blood loss anemia    AKI (acute kidney injury) ([place]) 01/11/2018   Alcohol abuse 07/05/2014   Allergy    Anxiety    Anxiety and depression    Arthritis    Cognitive deficit due to recent stroke 11/22/2016   COPD (chronic obstructive pulmonary disease) ([facility])    Depression    Dyslipidemia    Fibromyalgia    Hepatic cyst    Hyperlipidemia    Hypertension    Leukocytosis    Liver cyst 03/18/2014   01/26/2014 ultrasound Hypoechoic structure adjacent to gallbladder right lobe liver,  measuring 29 x 18 x 17 mm, possibly septated cyst     MI (myocardial infarction) ([facility]) 2007   By patient report, not substantiated by recent nuclear stress test.   Neuromuscular disorder ([place])    Seizure disorder ([place])    none since 2002    Seizures ([place])    Smoker unmotivated to quit    Quit for 3 years and then restarted   Substance abuse ([place])    ETOH   Vision abnormalities     Past Surgical History:  Procedure Laterality Date   CATARACT EXTRACTION W/PHACO Right 06/30/2018   Procedure: CATARACT EXTRACTION PHACO AND INTRAOCULAR LENS PLACEMENT RIGHT EYE;  Surgeon: Tonny Branch, MD;  Location: [facility];  Service: Ophthalmology;  Laterality: Right;  CDE: 9.90   CATARACT EXTRACTION W/PHACO Left 07/14/2018   Procedure: CATARACT EXTRACTION PHACO AND INTRAOCULAR LENS PLACEMENT (IOC);  Surgeon: Tonny Branch, MD;  Location: [facility];  Service: Ophthalmology;  Laterality: Left;  CDE: 6.96   CRANIOTOMY Left 11/19/2016   Procedure: CRANIOTOMY INTRACRANIAL FOR  ANEURYSM CLIPPING;  Surgeon: Kevan Ny Ditty, MD;  Location: [facility];  Service: Neurosurgery;  Laterality: Left;   ELBOW ARTHROPLASTY Left    rods   Exercise tolerance test  04/27/2013   CPET-MET: Submaximal effort (0.96, with goal of greater than 1.09) -- patient states that her effort was limited due to knee pain.  This makes the rest of the interpretation difficult: Peak VO2 - 10.5 (59% moderate), peak 02-pulse -  7..11 (low); heart rate 114 (73% -chronic incompetence considered);; PFTs normal    MOUTH SURGERY  NM MYOVIEW LTD  05/19/2013   LexiScan: EF 80%, no evidence of ischemia or infarction   TRANSTHORACIC ECHOCARDIOGRAM  03/25/2013   EF 55-60%, no regional wall motion abnormalities; normal diastolic parameters, normal pulmonary pressures.  No valvular lesions noted.  Essentially normal echo     There were no vitals filed for this visit.   Subjective Assessment - 04/04/21 1408     Subjective Patients states her back and legs still hurt. Patient states 80% improvement in PT intervention. She remains limited by knee and back pain. Her home exercises are going well. Still feels balance is off but hasnt fallen lately.    How long can you stand comfortably? 10 min     Currently in Pain? Yes    Pain Score 8     Pain Location Knee    Pain Orientation Right;Left    Pain Descriptors / Indicators Aching;Dull    Pain Type Chronic pain    Pain Onset More than a month ago    Pain Frequency Constant                [facility] PT Assessment - 04/04/21 0001       Assessment   Medical Diagnosis LBP    Referring Provider (PT) Carole Civil, MD      [place] residence    Living Arrangements Alone    Type of [place] to enter    Entrance Stairs-Number of Steps [place] One level    [place] - 4 wheels;Cane - single point;Shower seat;Toilet riser      Prior Function   Level of Independence Requires assistive device for independence;Needs assistance with ADLs    Level of Independence - [place] Minimal    Vocation Retired    Leisure at home. TV watching      Observation/Other Assessments   Focus on Therapeutic Outcomes (FOTO)  63% function      Coordination   Gross Motor Movements are Fluid and Coordinated Yes      AROM   Lumbar Flexion 0% limited    Lumbar Extension 50% limited    Lumbar - Right Side Bend 25% limited    Lumbar - Left Side Bend 25% limited      Strength   Right Hip Flexion 4-/5    Right Hip Extension 2+/5    Right Hip ABduction 3-/5    Right Hip ADduction 3/5    Left Hip Flexion 4/5    Left Hip Extension 2+/5    Left Hip ABduction 3/5    Left Hip ADduction 3/5    Right Knee Flexion 4+/5    Right Knee Extension 4+/5    Left Knee Flexion 4+/5    Left Knee Extension 4+/5    Right Ankle Dorsiflexion 4+/5    Right Ankle Plantar Flexion 3-/5    Left Ankle Dorsiflexion 4+/5    Left Ankle Plantar Flexion 2+/5      Ambulation/Gait   Ambulation/Gait Yes    Ambulation/Gait Assistance 5: Supervision    Ambulation Distance (Feet) 340 Feet    Assistive device Rolling walker    Gait Pattern Step-through pattern;Decreased step length -  right;Decreased step length - left    Gait velocity decreased    Gait Comments 2MWT  [facility] Adult PT Treatment/Exercise - 04/04/21 0001       Lumbar Exercises: Aerobic   Recumbent Bike 5 min level 4 for endurance                    PT Education - 04/04/21 1402     Education Details HEP, POC    Person(s) Educated Patient    Methods Explanation    Comprehension Verbalized understanding              PT Short Term Goals - 02/27/21 1330       PT SHORT TERM GOAL #1   Title Patient will be independent with HEP in order to improve functional outcomes.    Time 3    Period Weeks    Status Achieved    Target Date 02/24/21      PT SHORT TERM GOAL #2   Title Patient will report at least 25% improvement in symptoms for improved quality of life.    Baseline 50%    Time 3    Period Weeks    Status Achieved    Target Date 02/24/21      PT SHORT TERM GOAL #3   Title Demonstrate improved gait speed and tolerance as evidenced by distanc of 80 ft during 2MWT    Baseline 40 ft with RW    Time 3    Period Weeks    Status Achieved    Target Date 02/24/21               PT Long Term Goals - 04/04/21 1410       PT LONG TERM GOAL #1   Title Patient will improve FOTO score by at least 10 points in order to indicate improved tolerance to activity    Baseline 39% function    Time 6    Period Weeks    Status Achieved      PT LONG TERM GOAL #2   Title Demo gross 4/5 BLE strength to improve activity tolerance    Baseline 2+ to 4+/5 BLE    Time 6    Period Weeks    Status On-going      PT LONG TERM GOAL #3   Title Patient will report at least 50% improvement in symptoms for improved quality of life.    Baseline 8/10 back pain, self report of 10 min standing tolerance    Time 6    Period Weeks    Status Achieved      PT LONG TERM GOAL #4   Title Patient will report at least 75% improvement in symptoms for improved QoL.     Time 4    Period Weeks    Status Achieved                   Plan - 04/04/21 1402     Clinical Impression Statement Patient has met 3/3 short term goals and 3/4 long term goals with ability to complete HEP and improvement in symptoms, function, gait, strength, activity tolerance. Patient continues to remain limited by symptoms, strength deficits, activity tolerance, and functional mobility. Patient progress limited after last progress note secondary to some personal issues she has been working on. Patient overall progressing well toward goals. Extending POC to continue to improve strength deficits, symptoms and for further HEP development. Patient will continue to benefit from skilled physical therapy in order to reduce impairment and improve function.    Personal Factors and Comorbidities Age;Comorbidity 3+;Time  since onset of injury/illness/exacerbation    Comorbidities multiple comorbid conditions    Examination-Activity Limitations Bend;Carry;Lift;Stand;Stairs;Squat;Locomotion Level;Transfers    Examination-Participation Restrictions Church;Community Activity;Laundry;Meal Prep    Stability/Clinical Decision Making Evolving/Moderate complexity    Rehab Potential Good    PT Frequency 2x / week    PT Duration 4 weeks    PT Treatment/Interventions ADLs/Self Care Home Management;Electrical Stimulation;DME Instruction;Traction;Gait training;Stair training;Functional mobility training;Therapeutic activities;Therapeutic exercise;Balance training;Patient/family education;Neuromuscular re-education;Manual techniques;Passive range of motion;Spinal Manipulations;Joint Manipulations;Taping    PT Next Visit Plan Continue with BLE strengthening, core stabilization. Resume prior activity as tolerated.  Add piriformis stretch; build HEP  and anticipate d/c at end of POC    PT Home Exercise Plan QS, hip add isometric, SLR 6/22 HR, hip abd standing at sink. PPT, SKTC, DKTC    Consulted and Agree  with Plan of Care Patient             Patient will benefit from skilled therapeutic intervention in order to improve the following deficits and impairments:  Abnormal gait, Decreased activity tolerance, Decreased balance, Decreased mobility, Decreased endurance, Decreased strength, Difficulty walking, Postural dysfunction, Improper body mechanics, Pain  Visit Diagnosis: Chronic midline low back pain, unspecified whether sciatica present  Muscle weakness (generalized)  Difficulty in walking, not elsewhere classified     Problem List Patient Active Problem List   Diagnosis Date Noted   Acute pulmonary embolism ([place]) 01/13/2018   Chest pain 01/11/2018   Elevated troponin 01/11/2018   AKI (acute kidney injury) ([place]) 01/11/2018   NSTEMI (non-ST elevated myocardial infarction) ([facility]) 01/11/2018   Vascular headache    Seizure prophylaxis    Weakness of left third cranial nerve    Gait disturbance, post-stroke 11/22/2016   Benign essential HTN    Seizure disorder ([place])    Acute blood loss anemia    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   Bilateral leg pain 06/26/2016   AAA (abdominal aortic aneurysm) without rupture ([place]) 06/26/2016   Insomnia 06/07/2015   Urinary incontinence 06/07/2015   Chronic pain syndrome 03/04/2015   Fibromyalgia 03/04/2015   Cervical radiculitis 03/04/2015   Lumbar radicular pain 03/04/2015   Major depression 07/05/2014   Claudication, class I ([place]) 03/18/2013   Smoker unmotivated to quit     2:43 PM, 04/04/21 Mearl Latin PT, DPT Physical Therapist at [facility] [address] [place], [place], [address] Phone: [phone]   Fax:  [phone]  Name: JILLIEN YAKEL MRN: [id number] Date of Birth: May 13, 1946

## 2021-04-07 ENCOUNTER — Encounter (HOSPITAL_COMMUNITY): Payer: Medicare HMO

## 2021-04-10 ENCOUNTER — Encounter (HOSPITAL_COMMUNITY): Payer: Medicare HMO | Admitting: Physical Therapy

## 2021-04-13 ENCOUNTER — Encounter (HOSPITAL_COMMUNITY): Payer: Medicare HMO | Admitting: Physical Therapy

## 2021-04-18 ENCOUNTER — Ambulatory Visit (HOSPITAL_COMMUNITY): Payer: Medicare HMO | Admitting: Physical Therapy

## 2021-04-18 ENCOUNTER — Other Ambulatory Visit: Payer: Self-pay

## 2021-04-18 ENCOUNTER — Encounter (HOSPITAL_COMMUNITY): Payer: Self-pay | Admitting: Physical Therapy

## 2021-04-18 DIAGNOSIS — G8929 Other chronic pain: Secondary | ICD-10-CM | POA: Diagnosis not present

## 2021-04-18 DIAGNOSIS — R262 Difficulty in walking, not elsewhere classified: Secondary | ICD-10-CM | POA: Diagnosis not present

## 2021-04-18 DIAGNOSIS — M545 Low back pain, unspecified: Secondary | ICD-10-CM | POA: Diagnosis not present

## 2021-04-18 DIAGNOSIS — M6281 Muscle weakness (generalized): Secondary | ICD-10-CM

## 2021-04-18 NOTE — Therapy (Signed)
[place], [place], [address] Phone: [phone]   Fax:  [phone]  Physical Therapy Treatment  Patient Details  Name: Jennifer Jacobs MRN: [id number] Date of Birth: 1946-01-09 Referring Provider (PT): Carole Civil, MD   Encounter Date: 04/18/2021   PT End of Session - 04/18/21 1308     Visit Number 16    Number of Visits 28    Date for PT Re-Evaluation 05/02/21    Authorization Type [employer] H[employer], auth required, visit limit MCR guidelines    Authorization Time Period 12 visits approved 6/17-->03/17/21; 12 visits approved 7/11-8/8; 8 visits approved 8/16-9/13    Authorization - Visit Number 1    Authorization - Number of Visits 8    Progress Note Due on Visit 29    PT Start Time 1305    PT Stop Time 1343    PT Time Calculation (min) 38 min    Activity Tolerance Patient tolerated treatment well    Behavior During Therapy [facility] for tasks assessed/performed             Past Medical History:  Diagnosis Date   Acute blood loss anemia    AKI (acute kidney injury) ([place]) 01/11/2018   Alcohol abuse 07/05/2014   Allergy    Anxiety    Anxiety and depression    Arthritis    Cognitive deficit due to recent stroke 11/22/2016   COPD (chronic obstructive pulmonary disease) ([place])    Depression    Dyslipidemia    Fibromyalgia    Hepatic cyst    Hyperlipidemia    Hypertension    Leukocytosis    Liver cyst 03/18/2014   01/26/2014 ultrasound Hypoechoic structure adjacent to gallbladder right lobe liver,  measuring 29 x 18 x 17 mm, possibly septated cyst     MI (myocardial infarction) ([place]) 2007   By patient report, not substantiated by recent nuclear stress test.   Neuromuscular disorder ([place])    Seizure disorder ([place])    none since 2002   Seizures ([place])    Smoker unmotivated to quit    Quit for 3 years and then restarted   Substance abuse ([place])    ETOH   Vision abnormalities     Past Surgical  History:  Procedure Laterality Date   CATARACT EXTRACTION W/PHACO Right 06/30/2018   Procedure: CATARACT EXTRACTION PHACO AND INTRAOCULAR LENS PLACEMENT RIGHT EYE;  Surgeon: Tonny Branch, MD;  Location: [facility];  Service: Ophthalmology;  Laterality: Right;  CDE: 9.90   CATARACT EXTRACTION W/PHACO Left 07/14/2018   Procedure: CATARACT EXTRACTION PHACO AND INTRAOCULAR LENS PLACEMENT (IOC);  Surgeon: Tonny Branch, MD;  Location: [facility];  Service: Ophthalmology;  Laterality: Left;  CDE: 6.96   CRANIOTOMY Left 11/19/2016   Procedure: CRANIOTOMY INTRACRANIAL FOR  ANEURYSM CLIPPING;  Surgeon: Kevan Ny Ditty, MD;  Location: [facility];  Service: Neurosurgery;  Laterality: Left;   ELBOW ARTHROPLASTY Left    rods   Exercise tolerance test  04/27/2013   CPET-MET: Submaximal effort (0.96, with goal of greater than 1.09) -- patient states that her effort was limited due to knee pain.  This makes the rest of the interpretation difficult: Peak VO2 - 10.5 (59% moderate), peak 02-pulse -  7..11 (low); heart rate 114 (73% -chronic incompetence considered);; PFTs normal    MOUTH SURGERY     NM MYOVIEW LTD  05/19/2013   LexiScan: EF 80%, no evidence of ischemia or infarction   TRANSTHORACIC ECHOCARDIOGRAM  03/25/2013   EF 55-60%, no regional wall motion abnormalities; normal diastolic parameters, normal pulmonary pressures.  No valvular lesions noted.  Essentially normal echo     There were no vitals filed for this visit.   Subjective Assessment - 04/18/21 1312     Subjective States back and legs are still hurting. Doing HEP about 2 x a week because sometimes she doesn't feel good.    How long can you stand comfortably? 10 min    Currently in Pain? Yes    Pain Score 7     Pain Location Back    Pain Orientation Posterior;Lower    Pain Descriptors / Indicators Aching    Pain Type Chronic pain    Pain Onset More than a month ago                               [facility] Adult PT  Treatment/Exercise - 04/18/21 0001       Lumbar Exercises: Standing   Heel Raises 20 reps    Other Standing Lumbar Exercises palloff press RTB x 15 each      Lumbar Exercises: Supine   Ab Set 10 reps    Bridge 15 reps    Straight Leg Raise 15 reps      Knee/Hip Exercises: Standing   Hip Abduction Both;2 sets;10 reps    Forward Step Up Both;10 reps;Hand Hold: 2;3 sets;Step Height: 6"      Knee/Hip Exercises: Seated   Sit to Sand 2 sets;10 reps;without UE support                      PT Short Term Goals - 02/27/21 1330       PT SHORT TERM GOAL #1   Title Patient will be independent with HEP in order to improve functional outcomes.    Time 3    Period Weeks    Status Achieved    Target Date 02/24/21      PT SHORT TERM GOAL #2   Title Patient will report at least 25% improvement in symptoms for improved quality of life.    Baseline 50%    Time 3    Period Weeks    Status Achieved    Target Date 02/24/21      PT SHORT TERM GOAL #3   Title Demonstrate improved gait speed and tolerance as evidenced by distanc of 80 ft during 2MWT    Baseline 40 ft with RW    Time 3    Period Weeks    Status Achieved    Target Date 02/24/21               PT Long Term Goals - 04/04/21 1410       PT LONG TERM GOAL #1   Title Patient will improve FOTO score by at least 10 points in order to indicate improved tolerance to activity    Baseline 39% function    Time 6    Period Weeks    Status Achieved      PT LONG TERM GOAL #2   Title Demo gross 4/5 BLE strength to improve activity tolerance    Baseline 2+ to 4+/5 BLE    Time 6    Period Weeks    Status On-going      PT LONG TERM GOAL #3   Title Patient will report at least 50% improvement in symptoms for improved quality  of life.    Baseline 8/10 back pain, self report of 10 min standing tolerance    Time 6    Period Weeks    Status Achieved      PT LONG TERM GOAL #4   Title Patient will report at least  75% improvement in symptoms for improved QoL.    Time 4    Period Weeks    Status Achieved                   Plan - 04/18/21 1342     Clinical Impression Statement Patient tolerated session well overall today. Progress LE and core strengthening exercise. Patient was well challenged with palloff press requiring frequent cues and demo for proper form and mechanics. Patient noting increased fatigue post session. Patient will continue to benefit from skilled therapy services to reduce deficits and improve functional level.    Personal Factors and Comorbidities Age;Comorbidity 3+;Time since onset of injury/illness/exacerbation    Comorbidities multiple comorbid conditions    Examination-Activity Limitations Bend;Carry;Lift;Stand;Stairs;Squat;Locomotion Level;Transfers    Examination-Participation Restrictions Church;Community Activity;Laundry;Meal Prep    Stability/Clinical Decision Making Evolving/Moderate complexity    Rehab Potential Good    PT Frequency 2x / week    PT Duration 4 weeks    PT Treatment/Interventions ADLs/Self Care Home Management;Electrical Stimulation;DME Instruction;Traction;Gait training;Stair training;Functional mobility training;Therapeutic activities;Therapeutic exercise;Balance training;Patient/family education;Neuromuscular re-education;Manual techniques;Passive range of motion;Spinal Manipulations;Joint Manipulations;Taping    PT Next Visit Plan Continue with BLE strengthening, core stabilization. Add band rows, extension. Progress HEP as able.    PT Home Exercise Plan QS, hip add isometric, SLR 6/22 HR, hip abd standing at sink. PPT, SKTC, DKTC    Consulted and Agree with Plan of Care Patient             Patient will benefit from skilled therapeutic intervention in order to improve the following deficits and impairments:  Abnormal gait, Decreased activity tolerance, Decreased balance, Decreased mobility, Decreased endurance, Decreased strength,  Difficulty walking, Postural dysfunction, Improper body mechanics, Pain  Visit Diagnosis: Chronic midline low back pain, unspecified whether sciatica present  Muscle weakness (generalized)  Difficulty in walking, not elsewhere classified     Problem List Patient Active Problem List   Diagnosis Date Noted   Acute pulmonary embolism ([place]) 01/13/2018   Chest pain 01/11/2018   Elevated troponin 01/11/2018   AKI (acute kidney injury) ([place]) 01/11/2018   NSTEMI (non-ST elevated myocardial infarction) ([place]) 01/11/2018   Vascular headache    Seizure prophylaxis    Weakness of left third cranial nerve    Gait disturbance, post-stroke 11/22/2016   Benign essential HTN    Seizure disorder ([place])    Acute blood loss anemia    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   Bilateral leg pain 06/26/2016   AAA (abdominal aortic aneurysm) without rupture ([place]) 06/26/2016   Insomnia 06/07/2015   Urinary incontinence 06/07/2015   Chronic pain syndrome 03/04/2015   Fibromyalgia 03/04/2015   Cervical radiculitis 03/04/2015   Lumbar radicular pain 03/04/2015   Major depression 07/05/2014   Claudication, class I ([place]) 03/18/2013   Smoker unmotivated to quit    1:47 PM, 04/18/21 Josue Hector PT DPT  Physical Therapist with [facility]  [phone] [address] [place], [place], [address] Phone: [phone]   Fax:  [phone]  Name: KANNON BAUM MRN: [id number] Date of Birth: May 25, 1946

## 2021-04-19 DIAGNOSIS — I10 Essential (primary) hypertension: Secondary | ICD-10-CM | POA: Diagnosis not present

## 2021-04-19 DIAGNOSIS — D649 Anemia, unspecified: Secondary | ICD-10-CM | POA: Diagnosis not present

## 2021-04-21 ENCOUNTER — Other Ambulatory Visit: Payer: Self-pay

## 2021-04-21 ENCOUNTER — Ambulatory Visit (HOSPITAL_COMMUNITY): Payer: Medicare HMO | Attending: Orthopedic Surgery | Admitting: Physical Therapy

## 2021-04-21 DIAGNOSIS — M6281 Muscle weakness (generalized): Secondary | ICD-10-CM | POA: Diagnosis not present

## 2021-04-21 DIAGNOSIS — M545 Low back pain, unspecified: Secondary | ICD-10-CM | POA: Diagnosis not present

## 2021-04-21 DIAGNOSIS — G8929 Other chronic pain: Secondary | ICD-10-CM | POA: Diagnosis not present

## 2021-04-21 DIAGNOSIS — R262 Difficulty in walking, not elsewhere classified: Secondary | ICD-10-CM | POA: Diagnosis not present

## 2021-04-21 NOTE — Therapy (Signed)
[place], [place], [address] Phone: [phone]   Fax:  [phone]  Physical Therapy Treatment  Patient Details  Name: Jennifer Jacobs MRN: [id number] Date of Birth: 15-Mar-1946 Referring Provider (PT): Carole Civil, MD   Encounter Date: 04/21/2021   PT End of Session - 04/21/21 1115     Visit Number 17    Number of Visits 28    Date for PT Re-Evaluation 05/02/21    Authorization Type [employer] HMO, auth required, visit limit MCR guidelines    Authorization Time Period 12 visits approved 6/17-->03/17/21; 12 visits approved 7/11-8/8; 8 visits approved 8/16-9/13    Authorization - Visit Number 2    Authorization - Number of Visits 8    Progress Note Due on Visit 39    PT Start Time 1048    PT Stop Time 9323    PT Time Calculation (min) 38 min    Activity Tolerance Patient tolerated treatment well    Behavior During Therapy [facility] for tasks assessed/performed             Past Medical History:  Diagnosis Date   Acute blood loss anemia    AKI (acute kidney injury) ([place]) 01/11/2018   Alcohol abuse 07/05/2014   Allergy    Anxiety    Anxiety and depression    Arthritis    Cognitive deficit due to recent stroke 11/22/2016   COPD (chronic obstructive pulmonary disease) ([place])    Depression    Dyslipidemia    Fibromyalgia    Hepatic cyst    Hyperlipidemia    Hypertension    Leukocytosis    Liver cyst 03/18/2014   01/26/2014 ultrasound Hypoechoic structure adjacent to gallbladder right lobe liver,  measuring 29 x 18 x 17 mm, possibly septated cyst     MI (myocardial infarction) ([place]) 2007   By patient report, not substantiated by recent nuclear stress test.   Neuromuscular disorder ([place])    Seizure disorder ([place])    none since 2002   Seizures ([place])    Smoker unmotivated to quit    Quit for 3 years and then restarted   Substance abuse ([place])    ETOH   Vision abnormalities     Past Surgical  History:  Procedure Laterality Date   CATARACT EXTRACTION W/PHACO Right 06/30/2018   Procedure: CATARACT EXTRACTION PHACO AND INTRAOCULAR LENS PLACEMENT RIGHT EYE;  Surgeon: Tonny Branch, MD;  Location: [facility];  Service: Ophthalmology;  Laterality: Right;  CDE: 9.90   CATARACT EXTRACTION W/PHACO Left 07/14/2018   Procedure: CATARACT EXTRACTION PHACO AND INTRAOCULAR LENS PLACEMENT (IOC);  Surgeon: Tonny Branch, MD;  Location: [facility];  Service: Ophthalmology;  Laterality: Left;  CDE: 6.96   CRANIOTOMY Left 11/19/2016   Procedure: CRANIOTOMY INTRACRANIAL FOR  ANEURYSM CLIPPING;  Surgeon: Kevan Ny Ditty, MD;  Location: [facility];  Service: Neurosurgery;  Laterality: Left;   ELBOW ARTHROPLASTY Left    rods   Exercise tolerance test  04/27/2013   CPET-MET: Submaximal effort (0.96, with goal of greater than 1.09) -- patient states that her effort was limited due to knee pain.  This makes the rest of the interpretation difficult: Peak VO2 - 10.5 (59% moderate), peak 02-pulse -  7..11 (low); heart rate 114 (73% -chronic incompetence considered);; PFTs normal    MOUTH SURGERY     NM MYOVIEW LTD  05/19/2013   LexiScan: EF 80%, no evidence of ischemia or infarction   TRANSTHORACIC ECHOCARDIOGRAM  03/25/2013   EF 55-60%, no regional wall motion abnormalities; normal diastolic parameters, normal pulmonary pressures.  No valvular lesions noted.  Essentially normal echo     There were no vitals filed for this visit.   Subjective Assessment - 04/21/21 1047     Subjective Pt states that both of her knees bother her equally she states that when she states that her legs are hurting she really means her knees    How long can you stand comfortably? 10 min    Pain Score 7     Pain Location Knee    Pain Orientation Left;Right    Pain Descriptors / Indicators Aching    Pain Type Chronic pain    Pain Onset More than a month ago                               [facility] Adult PT Treatment/Exercise  - 04/21/21 0001       Exercises   Exercises Lumbar;Knee/Hip      Lumbar Exercises: Stretches   Single Knee to Chest Stretch Right;Left;3 reps;30 seconds    Double Knee to Chest Stretch 3 reps;30 seconds    Pelvic Tilt 10 reps    Other Lumbar Stretch Exercise hip excurison exercises x 3      Lumbar Exercises: Standing   Heel Raises 15 reps    Functional Squats 10 reps    Scapular Retraction Strengthening;Both;10 reps;Theraband    Theraband Level (Scapular Retraction) Level 2 (Red)    Row 10 reps;Both    Theraband Level (Row) Level 2 (Red)    Shoulder Extension Both;10 reps    Theraband Level (Shoulder Extension) Level 2 (Red)    Other Standing Lumbar Exercises palloff press RTB x 15 each    Other Standing Lumbar Exercises side stepping 1 RT l step up 4" B x 10      Lumbar Exercises: Seated   Sit to Stand 10 reps   raised elevation   Other Seated Lumbar Exercises scapular retraction x 10                      PT Short Term Goals - 02/27/21 1330       PT SHORT TERM GOAL #1   Title Patient will be independent with HEP in order to improve functional outcomes.    Time 3    Period Weeks    Status Achieved    Target Date 02/24/21      PT SHORT TERM GOAL #2   Title Patient will report at least 25% improvement in symptoms for improved quality of life.    Baseline 50%    Time 3    Period Weeks    Status Achieved    Target Date 02/24/21      PT SHORT TERM GOAL #3   Title Demonstrate improved gait speed and tolerance as evidenced by distanc of 80 ft during 2MWT    Baseline 40 ft with RW    Time 3    Period Weeks    Status Achieved    Target Date 02/24/21               PT Long Term Goals - 04/04/21 1410       PT LONG TERM GOAL #1   Title Patient will improve FOTO score by at least 10 points in order to indicate improved tolerance to activity    Baseline 39%  function    Time 6    Period Weeks    Status Achieved      PT LONG TERM GOAL #2   Title  Demo gross 4/5 BLE strength to improve activity tolerance    Baseline 2+ to 4+/5 BLE    Time 6    Period Weeks    Status On-going      PT LONG TERM GOAL #3   Title Patient will report at least 50% improvement in symptoms for improved quality of life.    Baseline 8/10 back pain, self report of 10 min standing tolerance    Time 6    Period Weeks    Status Achieved      PT LONG TERM GOAL #4   Title Patient will report at least 75% improvement in symptoms for improved QoL.    Time 4    Period Weeks    Status Achieved                   Plan - 04/21/21 1116     Clinical Impression Statement Progressed pt with leg strengthening exercises this session.  Pt needs multiple verbal cuing on all exercises for improved form.  PT encouraged to do exercises at least 3x a week at home.    Personal Factors and Comorbidities Age;Comorbidity 3+;Time since onset of injury/illness/exacerbation    Comorbidities multiple comorbid conditions    Examination-Activity Limitations Bend;Carry;Lift;Stand;Stairs;Squat;Locomotion Level;Transfers    Examination-Participation Restrictions Church;Community Activity;Laundry;Meal Prep    Stability/Clinical Decision Making Evolving/Moderate complexity    Rehab Potential Good    PT Frequency 2x / week    PT Duration 4 weeks    PT Treatment/Interventions ADLs/Self Care Home Management;Electrical Stimulation;DME Instruction;Traction;Gait training;Stair training;Functional mobility training;Therapeutic activities;Therapeutic exercise;Balance training;Patient/family education;Neuromuscular re-education;Manual techniques;Passive range of motion;Spinal Manipulations;Joint Manipulations;Taping    PT Next Visit Plan Continue with BLE strengthening, core stabilization.  Progress HEP as able.    PT Home Exercise Plan QS, hip add isometric, SLR 6/22 HR, hip abd standing at sink. PPT, SKTC, DKTC; 04/21/2021: bridge, squat, ab set    Consulted and Agree with Plan of Care  Patient             Patient will benefit from skilled therapeutic intervention in order to improve the following deficits and impairments:  Abnormal gait, Decreased activity tolerance, Decreased balance, Decreased mobility, Decreased endurance, Decreased strength, Difficulty walking, Postural dysfunction, Improper body mechanics, Pain  Visit Diagnosis: Chronic midline low back pain, unspecified whether sciatica present  Muscle weakness (generalized)  Difficulty in walking, not elsewhere classified     Problem List Patient Active Problem List   Diagnosis Date Noted   Acute pulmonary embolism ([place]) 01/13/2018   Chest pain 01/11/2018   Elevated troponin 01/11/2018   AKI (acute kidney injury) ([place]) 01/11/2018   NSTEMI (non-ST elevated myocardial infarction) ([place]) 01/11/2018   Vascular headache    Seizure prophylaxis    Weakness of left third cranial nerve    Gait disturbance, post-stroke 11/22/2016   Benign essential HTN    Seizure disorder ([place])    Acute blood loss anemia    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   Bilateral leg pain 06/26/2016   AAA (abdominal aortic aneurysm) without rupture ([place]) 06/26/2016   Insomnia 06/07/2015   Urinary incontinence 06/07/2015   Chronic pain syndrome 03/04/2015   Fibromyalgia 03/04/2015   Cervical radiculitis 03/04/2015   Lumbar radicular pain 03/04/2015   Major depression 07/05/2014   Claudication, class I ([place]) 03/18/2013  Smoker unmotivated to quit    Rayetta Humphrey, PT CLT [phone] 04/21/2021, 11:29 AM  [place] [address] [place], [place], [address] Phone: [phone]   Fax:  [phone]  Name: Jennifer Jacobs MRN: [id number] Date of Birth: 08-Dec-1945

## 2021-04-26 ENCOUNTER — Ambulatory Visit (HOSPITAL_COMMUNITY): Payer: Medicare HMO

## 2021-04-26 ENCOUNTER — Other Ambulatory Visit: Payer: Self-pay

## 2021-04-26 ENCOUNTER — Encounter (HOSPITAL_COMMUNITY): Payer: Self-pay

## 2021-04-26 DIAGNOSIS — G8929 Other chronic pain: Secondary | ICD-10-CM | POA: Diagnosis not present

## 2021-04-26 DIAGNOSIS — R262 Difficulty in walking, not elsewhere classified: Secondary | ICD-10-CM

## 2021-04-26 DIAGNOSIS — M545 Low back pain, unspecified: Secondary | ICD-10-CM

## 2021-04-26 DIAGNOSIS — M6281 Muscle weakness (generalized): Secondary | ICD-10-CM | POA: Diagnosis not present

## 2021-04-26 NOTE — Therapy (Signed)
[place], [place], [address] Phone: [phone]   Fax:  [phone]  Physical Therapy Treatment  Patient Details  Name: Jennifer Jacobs MRN: [id number] Date of Birth: 08-May-1946 Referring Provider (PT): Carole Civil, MD   Encounter Date: 04/26/2021   PT End of Session - 04/26/21 1340     Visit Number 18    Number of Visits 28    Date for PT Re-Evaluation 05/02/21    Authorization Type [employer] HMO, auth required, visit limit MCR guidelines    Authorization Time Period 12 visits approved 6/17-->03/17/21; 12 visits approved 7/11-8/8; 8 visits approved 8/16-9/13    Authorization - Visit Number 3    Authorization - Number of Visits 8    Progress Note Due on Visit 29    PT Start Time 1315    PT Stop Time 3762    PT Time Calculation (min) 40 min    Activity Tolerance Patient tolerated treatment well    Behavior During Therapy [facility] for tasks assessed/performed             Past Medical History:  Diagnosis Date   Acute blood loss anemia    AKI (acute kidney injury) ([place]) 01/11/2018   Alcohol abuse 07/05/2014   Allergy    Anxiety    Anxiety and depression    Arthritis    Cognitive deficit due to recent stroke 11/22/2016   COPD (chronic obstructive pulmonary disease) ([place])    Depression    Dyslipidemia    Fibromyalgia    Hepatic cyst    Hyperlipidemia    Hypertension    Leukocytosis    Liver cyst 03/18/2014   01/26/2014 ultrasound Hypoechoic structure adjacent to gallbladder right lobe liver,  measuring 29 x 18 x 17 mm, possibly septated cyst     MI (myocardial infarction) ([place]) 2007   By patient report, not substantiated by recent nuclear stress test.   Neuromuscular disorder ([place])    Seizure disorder ([place])    none since 2002   Seizures ([place])    Smoker unmotivated to quit    Quit for 3 years and then restarted   Substance abuse ([place])    ETOH   Vision abnormalities     Past Surgical  History:  Procedure Laterality Date   CATARACT EXTRACTION W/PHACO Right 06/30/2018   Procedure: CATARACT EXTRACTION PHACO AND INTRAOCULAR LENS PLACEMENT RIGHT EYE;  Surgeon: Tonny Branch, MD;  Location: [facility];  Service: Ophthalmology;  Laterality: Right;  CDE: 9.90   CATARACT EXTRACTION W/PHACO Left 07/14/2018   Procedure: CATARACT EXTRACTION PHACO AND INTRAOCULAR LENS PLACEMENT (IOC);  Surgeon: Tonny Branch, MD;  Location: [facility];  Service: Ophthalmology;  Laterality: Left;  CDE: 6.96   CRANIOTOMY Left 11/19/2016   Procedure: CRANIOTOMY INTRACRANIAL FOR  ANEURYSM CLIPPING;  Surgeon: Kevan Ny Ditty, MD;  Location: [facility];  Service: Neurosurgery;  Laterality: Left;   ELBOW ARTHROPLASTY Left    rods   Exercise tolerance test  04/27/2013   CPET-MET: Submaximal effort (0.96, with goal of greater than 1.09) -- patient states that her effort was limited due to knee pain.  This makes the rest of the interpretation difficult: Peak VO2 - 10.5 (59% moderate), peak 02-pulse -  7..11 (low); heart rate 114 (73% -chronic incompetence considered);; PFTs normal    MOUTH SURGERY     NM MYOVIEW LTD  05/19/2013   LexiScan: EF 80%, no evidence of ischemia or infarction   TRANSTHORACIC ECHOCARDIOGRAM  03/25/2013   EF 55-60%, no regional wall motion abnormalities; normal diastolic parameters, normal pulmonary pressures.  No valvular lesions noted.  Essentially normal echo     There were no vitals filed for this visit.   Subjective Assessment - 04/26/21 1326     Subjective Pt stated her legs are bothering her today.  Admits to not completing HEP this week due to soreness.  Reports a fall last week, was able to get up independently with no reports of increased pain.    Patient Stated Goals "Get my back to stop hurting and not fall down so much"    Currently in Pain? Yes    Pain Score 8     Pain Location Knee    Pain Orientation Right;Left    Pain Descriptors / Indicators Aching    Pain Type Chronic pain     Pain Onset More than a month ago    Pain Frequency Constant    Aggravating Factors  increased use    Pain Relieving Factors rest, tylenol, gabapentin    Effect of Pain on Daily Activities min effects on ADLs                               [facility] Adult PT Treatment/Exercise - 04/26/21 0001       Lumbar Exercises: Standing   Heel Raises 15 reps    Functional Squats 10 reps    Functional Squats Limitations 3D hip excursion    Scapular Retraction Strengthening;Both;10 reps;Theraband    Theraband Level (Scapular Retraction) Level 2 (Red)    Row 10 reps;Both    Theraband Level (Row) Level 2 (Red)    Shoulder Extension Both;10 reps    Theraband Level (Shoulder Extension) Level 2 (Red)    Other Standing Lumbar Exercises SLS 1" max; vector stance 3x 5"    Other Standing Lumbar Exercises paloff press NBOS RTB 15x 2; side stepping 3RT cueing for posture and to reduce ER.; tandem stance 2x30"      Lumbar Exercises: Seated   Sit to Stand 10 reps    Sit to Stand Limitations eccentric control, standard height                  Upper Extremity Functional Index Score :   /80     PT Short Term Goals - 02/27/21 1330       PT SHORT TERM GOAL #1   Title Patient will be independent with HEP in order to improve functional outcomes.    Time 3    Period Weeks    Status Achieved    Target Date 02/24/21      PT SHORT TERM GOAL #2   Title Patient will report at least 25% improvement in symptoms for improved quality of life.    Baseline 50%    Time 3    Period Weeks    Status Achieved    Target Date 02/24/21      PT SHORT TERM GOAL #3   Title Demonstrate improved gait speed and tolerance as evidenced by distanc of 80 ft during 2MWT    Baseline 40 ft with RW    Time 3    Period Weeks    Status Achieved    Target Date 02/24/21               PT Long Term Goals - 04/04/21 1410       PT LONG TERM GOAL #  1   Title Patient will improve FOTO score by at  least 10 points in order to indicate improved tolerance to activity    Baseline 39% function    Time 6    Period Weeks    Status Achieved      PT LONG TERM GOAL #2   Title Demo gross 4/5 BLE strength to improve activity tolerance    Baseline 2+ to 4+/5 BLE    Time 6    Period Weeks    Status On-going      PT LONG TERM GOAL #3   Title Patient will report at least 50% improvement in symptoms for improved quality of life.    Baseline 8/10 back pain, self report of 10 min standing tolerance    Time 6    Period Weeks    Status Achieved      PT LONG TERM GOAL #4   Title Patient will report at least 75% improvement in symptoms for improved QoL.    Time 4    Period Weeks    Status Achieved                   Plan - 04/26/21 1341     Clinical Impression Statement Added balance training to POC, difficulty with SLS based exercises.  Required multimodal verbal cueing on all exercises for proper form.  Discussed importance of HEP complinace for maximal benefits with therapy.  Reports of pain reduced at EOS.    Personal Factors and Comorbidities Age;Comorbidity 3+;Time since onset of injury/illness/exacerbation    Comorbidities multiple comorbid conditions    Examination-Activity Limitations Bend;Carry;Lift;Stand;Stairs;Squat;Locomotion Level;Transfers    Examination-Participation Restrictions Church;Community Activity;Laundry;Meal Prep    Stability/Clinical Decision Making Evolving/Moderate complexity    Clinical Decision Making Moderate    Rehab Potential Good    PT Frequency 2x / week    PT Duration 4 weeks    PT Treatment/Interventions ADLs/Self Care Home Management;Electrical Stimulation;DME Instruction;Traction;Gait training;Stair training;Functional mobility training;Therapeutic activities;Therapeutic exercise;Balance training;Patient/family education;Neuromuscular re-education;Manual techniques;Passive range of motion;Spinal Manipulations;Joint Manipulations;Taping    PT  Next Visit Plan Continue with BLE strengthening, core stabilization.  Progress HEP as able.    PT Home Exercise Plan QS, hip add isometric, SLR 6/22 HR, hip abd standing at sink. PPT, SKTC, DKTC; 04/21/2021: bridge, squat, ab set    Consulted and Agree with Plan of Care Patient             Patient will benefit from skilled therapeutic intervention in order to improve the following deficits and impairments:  Abnormal gait, Decreased activity tolerance, Decreased balance, Decreased mobility, Decreased endurance, Decreased strength, Difficulty walking, Postural dysfunction, Improper body mechanics, Pain  Visit Diagnosis: Chronic midline low back pain, unspecified whether sciatica present  Muscle weakness (generalized)  Difficulty in walking, not elsewhere classified     Problem List Patient Active Problem List   Diagnosis Date Noted   Acute pulmonary embolism ([place]) 01/13/2018   Chest pain 01/11/2018   Elevated troponin 01/11/2018   AKI (acute kidney injury) ([place]) 01/11/2018   NSTEMI (non-ST elevated myocardial infarction) ([place]) 01/11/2018   Vascular headache    Seizure prophylaxis    Weakness of left third cranial nerve    Gait disturbance, post-stroke 11/22/2016   Benign essential HTN    Seizure disorder ([place])    Acute blood loss anemia    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   Bilateral leg pain 06/26/2016   AAA (abdominal aortic aneurysm) without rupture ([place]) 06/26/2016   Insomnia  06/07/2015   Urinary incontinence 06/07/2015   Chronic pain syndrome 03/04/2015   Fibromyalgia 03/04/2015   Cervical radiculitis 03/04/2015   Lumbar radicular pain 03/04/2015   Major depression 07/05/2014   Claudication, class I ([place]) 03/18/2013   Smoker unmotivated to quit    Ihor Austin, LPTA/CLT; CBIS [phone]  Aldona Lento, PTA 04/26/2021, 2:00 PM  [place] [address] [place], [place], [address] Phone:  [phone]   Fax:  [phone]  Name: Jennifer Jacobs MRN: [id number] Date of Birth: Sep 02, 1945

## 2021-04-27 ENCOUNTER — Encounter (HOSPITAL_COMMUNITY): Payer: Medicare HMO

## 2021-05-02 ENCOUNTER — Ambulatory Visit (HOSPITAL_COMMUNITY): Payer: Medicare HMO | Admitting: Physical Therapy

## 2021-05-02 ENCOUNTER — Encounter (HOSPITAL_COMMUNITY): Payer: Self-pay | Admitting: Physical Therapy

## 2021-05-02 ENCOUNTER — Other Ambulatory Visit: Payer: Self-pay

## 2021-05-02 DIAGNOSIS — M545 Low back pain, unspecified: Secondary | ICD-10-CM | POA: Diagnosis not present

## 2021-05-02 DIAGNOSIS — M6281 Muscle weakness (generalized): Secondary | ICD-10-CM | POA: Diagnosis not present

## 2021-05-02 DIAGNOSIS — R262 Difficulty in walking, not elsewhere classified: Secondary | ICD-10-CM

## 2021-05-02 DIAGNOSIS — G8929 Other chronic pain: Secondary | ICD-10-CM | POA: Diagnosis not present

## 2021-05-02 NOTE — Therapy (Signed)
[place], [place], [address] Phone: [phone]   Fax:  [phone]  Physical Therapy Treatment/Discharge Summary  Patient Details  Name: Jennifer Jacobs MRN: [id number] Date of Birth: November 22, 1945 Referring Provider (PT): Carole Civil, MD   Encounter Date: 05/02/2021  PHYSICAL THERAPY DISCHARGE SUMMARY  Visits from Start of Care: 19  Current functional level related to goals / functional outcomes: See below   Remaining deficits: See below   Education / Equipment: See below   Patient agrees to discharge. Patient goals were met. Patient is being discharged due to meeting the stated rehab goals.    PT End of Session - 05/02/21 1306     Visit Number 19    Number of Visits 28    Date for PT Re-Evaluation 05/02/21    Authorization Type [employer] Medicare HMO, auth required, visit limit MCR guidelines    Authorization Time Period 12 visits approved 6/17-->03/17/21; 12 visits approved 7/11-8/8; 8 visits approved 8/16-9/13    Authorization - Visit Number 4    Authorization - Number of Visits 8    Progress Note Due on Visit 75    PT Start Time 1307    PT Stop Time 1335    PT Time Calculation (min) 28 min    Activity Tolerance Patient tolerated treatment well    Behavior During Therapy WFL for tasks assessed/performed             Past Medical History:  Diagnosis Date   Acute blood loss anemia    AKI (acute kidney injury) ([place]) 01/11/2018   Alcohol abuse 07/05/2014   Allergy    Anxiety    Anxiety and depression    Arthritis    Cognitive deficit due to recent stroke 11/22/2016   COPD (chronic obstructive pulmonary disease) ([place])    Depression    Dyslipidemia    Fibromyalgia    Hepatic cyst    Hyperlipidemia    Hypertension    Leukocytosis    Liver cyst 03/18/2014   01/26/2014 ultrasound Hypoechoic structure adjacent to gallbladder right lobe liver,  measuring 29 x 18 x 17 mm, possibly septated  cyst     MI (myocardial infarction) ([place]) 2007   By patient report, not substantiated by recent nuclear stress test.   Neuromuscular disorder ([place])    Seizure disorder ([place])    none since 2002   Seizures ([place])    Smoker unmotivated to quit    Quit for 3 years and then restarted   Substance abuse ([place])    ETOH   Vision abnormalities     Past Surgical History:  Procedure Laterality Date   CATARACT EXTRACTION W/PHACO Right 06/30/2018   Procedure: CATARACT EXTRACTION PHACO AND INTRAOCULAR LENS PLACEMENT RIGHT EYE;  Surgeon: Tonny Branch, MD;  Location[facility];  Service: Ophthalmology;  Laterality: Right;  CDE: 9.90   CATARACT EXTRACTION W/PHACO Left 07/14/2018   Procedure: CATARACT EXTRACTION PHACO AND INTRAOCULAR LENS PLACEMENT (IOC);  Surgeon: Tonny Branch, MD;  Location: [facility];  Service: Ophthalmology;  Laterality: Left;  CDE: 6.96   CRANIOTOMY Left 11/19/2016   Procedure: CRANIOTOMY INTRACRANIAL FOR  ANEURYSM CLIPPING;  Surgeon: Kevan Ny Ditty, MD;  Location: [facility];  Service: Neurosurgery;  Laterality: Left;   ELBOW ARTHROPLASTY Left    rods   Exercise tolerance test  04/27/2013   CPET-MET: Submaximal effort (0.96, with goal of greater than 1.09) -- patient states that her effort was limited due to knee pain.  This makes the rest of the interpretation difficult: Peak VO2 - 10.5 (59% moderate), peak 02-pulse -  7..11 (low); heart rate 114 (73% -chronic incompetence considered);; PFTs normal    MOUTH SURGERY     NM MYOVIEW LTD  05/19/2013   LexiScan: EF 80%, no evidence of ischemia or infarction   TRANSTHORACIC ECHOCARDIOGRAM  03/25/2013   EF 55-60%, no regional wall motion abnormalities; normal diastolic parameters, normal pulmonary pressures.  No valvular lesions noted.  Essentially normal echo     There were no vitals filed for this visit.   Subjective Assessment - 05/02/21 1307     Subjective Patient states continued back symptoms with bending and reaching overhead. She has  done her exercises. Patient states 80% improvement with PT intervention. She fell last week after walking down her porch steps.    Patient Stated Goals "Get my back to stop hurting and not fall down so much"    Currently in Pain? No/denies    Pain Onset More than a month ago                [facility] PT Assessment - 05/02/21 0001       Assessment   Medical Diagnosis LBP    Referring Provider (PT) Carole Civil, MD    Next MD Visit none scheduled    Prior Therapy none      Prior Function   Level of Independence Requires assistive device for independence;Needs assistance with ADLs    Vocation Retired    Leisure at home. TV watching      Observation/Other Assessments   Focus on Therapeutic Outcomes (FOTO)  53% function      Strength   Right Hip Flexion 4/5    Right Hip Extension 3-/5    Right Hip ABduction 3/5    Left Hip Flexion 4/5    Left Hip Extension 3-/5    Left Hip ABduction 3+/5    Right Knee Flexion 5/5    Right Knee Extension 5/5    Left Knee Flexion 5/5    Left Knee Extension 5/5    Right Ankle Dorsiflexion 5/5    Left Ankle Dorsiflexion 5/5      Ambulation/Gait   Ambulation/Gait Yes    Ambulation/Gait Assistance 5: Supervision    Ambulation Distance (Feet) 350 Feet    Assistive device None    Gait Pattern Step-through pattern;Decreased step length - right;Decreased step length - left    Gait velocity decreased    Gait Comments 2MWT                                    PT Education - 05/02/21 1307     Education Details HEP, reassessment findings, returning to PT if needed    Person(s) Educated Patient    Methods Explanation    Comprehension Verbalized understanding              PT Short Term Goals - 02/27/21 1330       PT SHORT TERM GOAL #1   Title Patient will be independent with HEP in order to improve functional outcomes.    Time 3    Period Weeks    Status Achieved    Target Date 02/24/21      PT SHORT  TERM GOAL #2   Title Patient will report at least 25% improvement in symptoms for improved quality of life.    Baseline  50%    Time 3    Period Weeks    Status Achieved    Target Date 02/24/21      PT SHORT TERM GOAL #3   Title Demonstrate improved gait speed and tolerance as evidenced by distanc of 80 ft during 2MWT    Baseline 40 ft with RW    Time 3    Period Weeks    Status Achieved    Target Date 02/24/21               PT Long Term Goals - 05/02/21 1339       PT LONG TERM GOAL #1   Title Patient will improve FOTO score by at least 10 points in order to indicate improved tolerance to activity    Baseline 53% function    Time 6    Period Weeks    Status Achieved      PT LONG TERM GOAL #2   Title Demo gross 4/5 BLE strength to improve activity tolerance    Baseline 3- to 5/5 BLE    Time 6    Period Weeks    Status Partially Met      PT LONG TERM GOAL #3   Title Patient will report at least 50% improvement in symptoms for improved quality of life.    Baseline 8/10 back pain, self report of 10 min standing tolerance    Time 6    Period Weeks    Status Achieved      PT LONG TERM GOAL #4   Title Patient will report at least 75% improvement in symptoms for improved QoL.    Time 4    Period Weeks    Status Achieved                   Plan - 05/02/21 1306     Clinical Impression Statement Patient has met 3/3 short term goals and 3/4 long term goals with good progress toward remaining goal. Patient has met goals with improvement in symptoms, strength, gait, activity tolerance, and functional mobility. She remains limited by impaired glute strength although it has improved. Patient educated on importance of compliance with HEP and on joining [employer] sne[employer] program. Decrease in FOTO score likely due to difficulty understanding questions apply to back vs overall health due to other health conditions limiting mobility/function also. Patient has made great  progress since beginning therapy. Patient discharged from physical therapy at this time.    Personal Factors and Comorbidities Age;Comorbidity 3+;Time since onset of injury/illness/exacerbation    Comorbidities multiple comorbid conditions    Examination-Activity Limitations Bend;Carry;Lift;Stand;Stairs;Squat;Locomotion Level;Transfers    Examination-Participation Restrictions Church;Community Activity;Laundry;Meal Prep    Stability/Clinical Decision Making Evolving/Moderate complexity    Rehab Potential Good    PT Frequency --    PT Duration --    PT Treatment/Interventions ADLs/Self Care Home Management;Electrical Stimulation;DME Instruction;Traction;Gait training;Stair training;Functional mobility training;Therapeutic activities;Therapeutic exercise;Balance training;Patient/family education;Neuromuscular re-education;Manual techniques;Passive range of motion;Spinal Manipulations;Joint Manipulations;Taping    PT Next Visit Plan n/a    PT Home Exercise Plan QS, hip add isometric, SLR 6/22 HR, hip abd standing at sink. PPT, SKTC, DKTC; 04/21/2021: bridge, squat, ab set    Consulted and Agree with Plan of Care Patient             Patient will benefit from skilled therapeutic intervention in order to improve the following deficits and impairments:  Abnormal gait, Decreased activity tolerance, Decreased balance, Decreased mobility, Decreased endurance, Decreased strength,  Difficulty walking, Postural dysfunction, Improper body mechanics, Pain  Visit Diagnosis: Chronic midline low back pain, unspecified whether sciatica present  Muscle weakness (generalized)  Difficulty in walking, not elsewhere classified     Problem List Patient Active Problem List   Diagnosis Date Noted   Acute pulmonary embolism ([place]) 01/13/2018   Chest pain 01/11/2018   Elevated troponin 01/11/2018   AKI (acute kidney injury) ([place]) 01/11/2018   NSTEMI (non-ST elevated myocardial infarction) ([place]) 01/11/2018    Vascular headache    Seizure prophylaxis    Weakness of left third cranial nerve    Gait disturbance, post-stroke 11/22/2016   Benign essential HTN    Seizure disorder ([place])    Acute blood loss anemia    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   Bilateral leg pain 06/26/2016   AAA (abdominal aortic aneurysm) without rupture ([place]) 06/26/2016   Insomnia 06/07/2015   Urinary incontinence 06/07/2015   Chronic pain syndrome 03/04/2015   Fibromyalgia 03/04/2015   Cervical radiculitis 03/04/2015   Lumbar radicular pain 03/04/2015   Major depression 07/05/2014   Claudication, class I ([place]) 03/18/2013   Smoker unmotivated to quit     1:42 PM, 05/02/21 Mearl Latin PT, DPT Physical Therapist at [facility] [place], [place], [address] Phone: [phone]   Fax:  [phone]  Name: FELESHIA ZUNDEL MRN: [id number] Date of Birth: 08-Nov-1945

## 2021-05-04 ENCOUNTER — Encounter (HOSPITAL_COMMUNITY): Payer: Medicare HMO | Admitting: Physical Therapy

## 2021-05-19 DIAGNOSIS — I10 Essential (primary) hypertension: Secondary | ICD-10-CM | POA: Diagnosis not present

## 2021-05-19 DIAGNOSIS — D649 Anemia, unspecified: Secondary | ICD-10-CM | POA: Diagnosis not present

## 2021-06-19 DIAGNOSIS — I10 Essential (primary) hypertension: Secondary | ICD-10-CM | POA: Diagnosis not present

## 2021-06-19 DIAGNOSIS — D649 Anemia, unspecified: Secondary | ICD-10-CM | POA: Diagnosis not present

## 2021-07-19 DIAGNOSIS — I10 Essential (primary) hypertension: Secondary | ICD-10-CM | POA: Diagnosis not present

## 2021-07-19 DIAGNOSIS — D649 Anemia, unspecified: Secondary | ICD-10-CM | POA: Diagnosis not present

## 2021-08-18 DIAGNOSIS — I10 Essential (primary) hypertension: Secondary | ICD-10-CM | POA: Diagnosis not present

## 2021-08-18 DIAGNOSIS — D649 Anemia, unspecified: Secondary | ICD-10-CM | POA: Diagnosis not present

## 2021-09-11 IMAGING — CR DG HIP (WITH OR WITHOUT PELVIS) 2-3V*R*
3 series · 3 of 3 positions shown · non-contrast
Comparison: None.

CLINICAL DATA: Pain following fall

EXAM:
DG HIP (WITH OR WITHOUT PELVIS) 2-3V RIGHT

[pelvis ap]
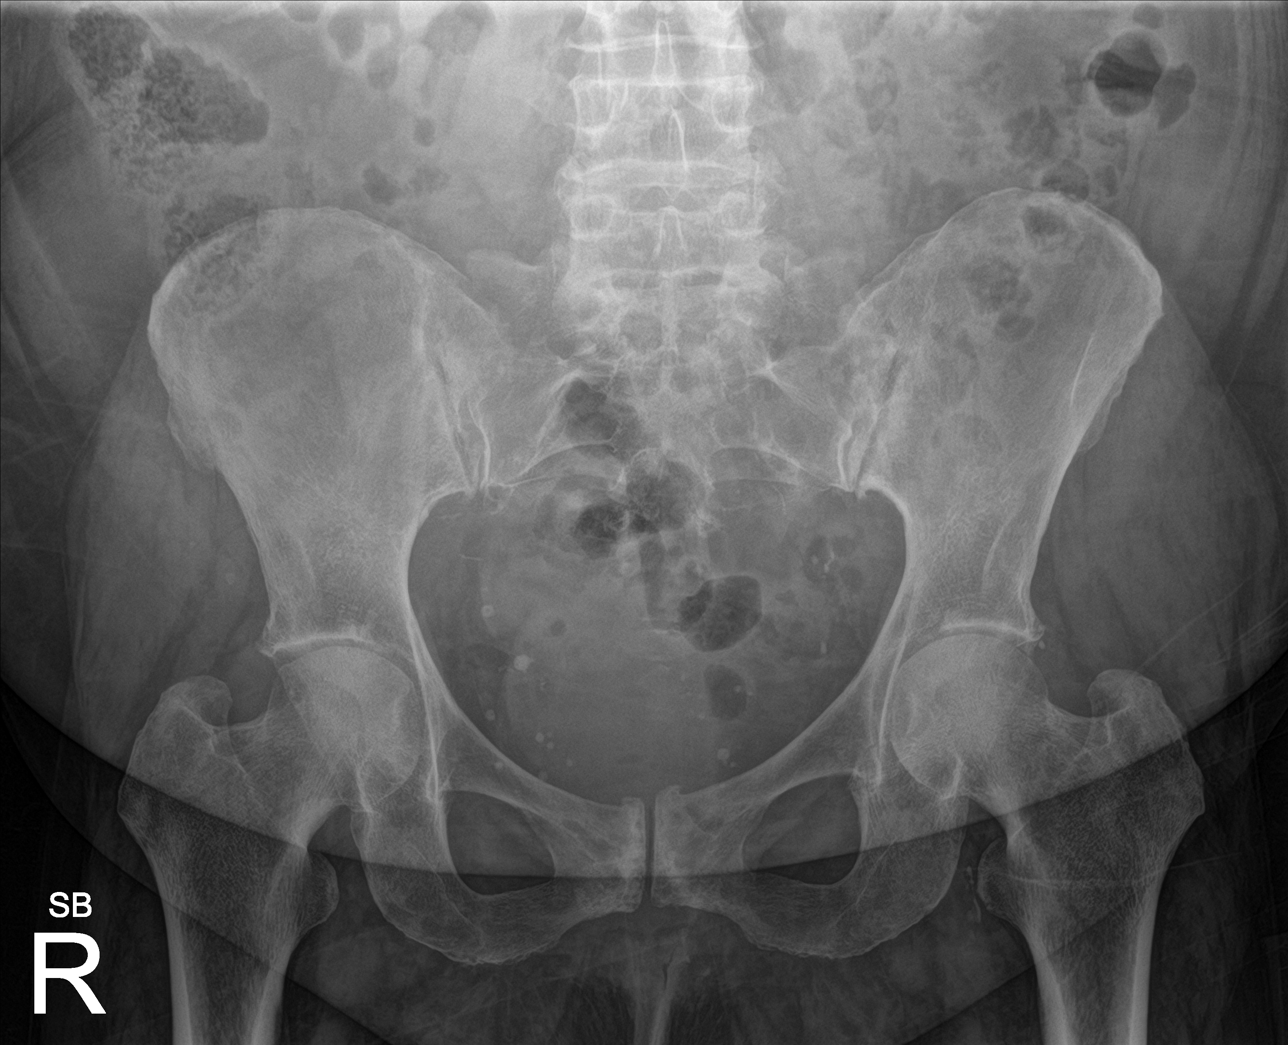

[hip ap]
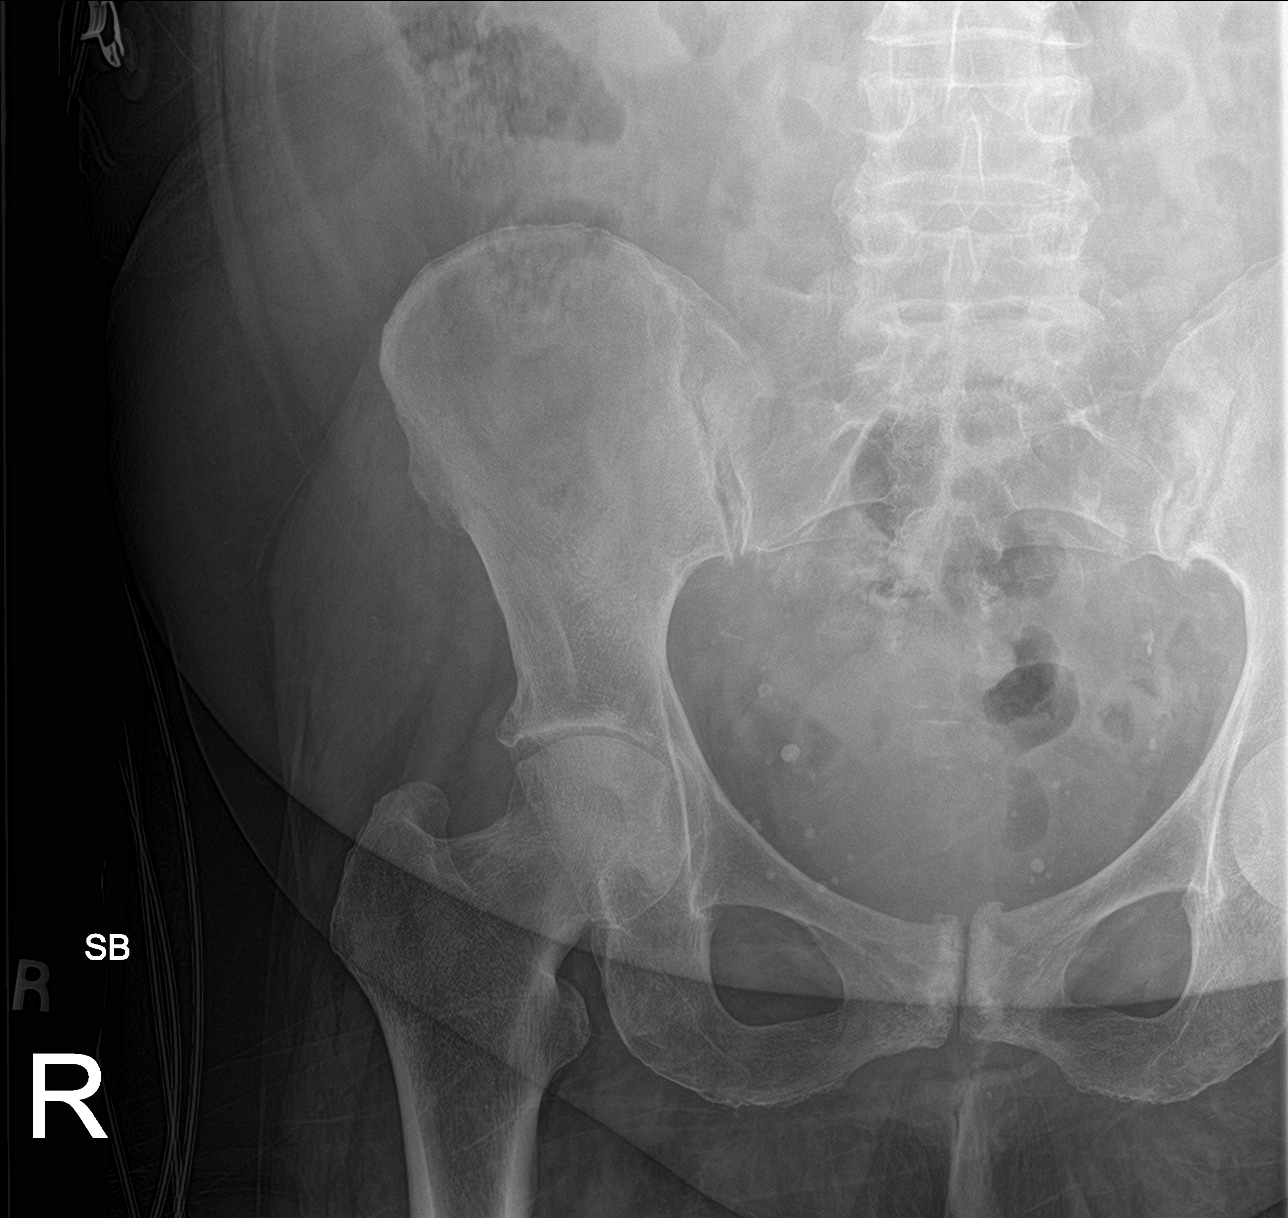

[hip lat]
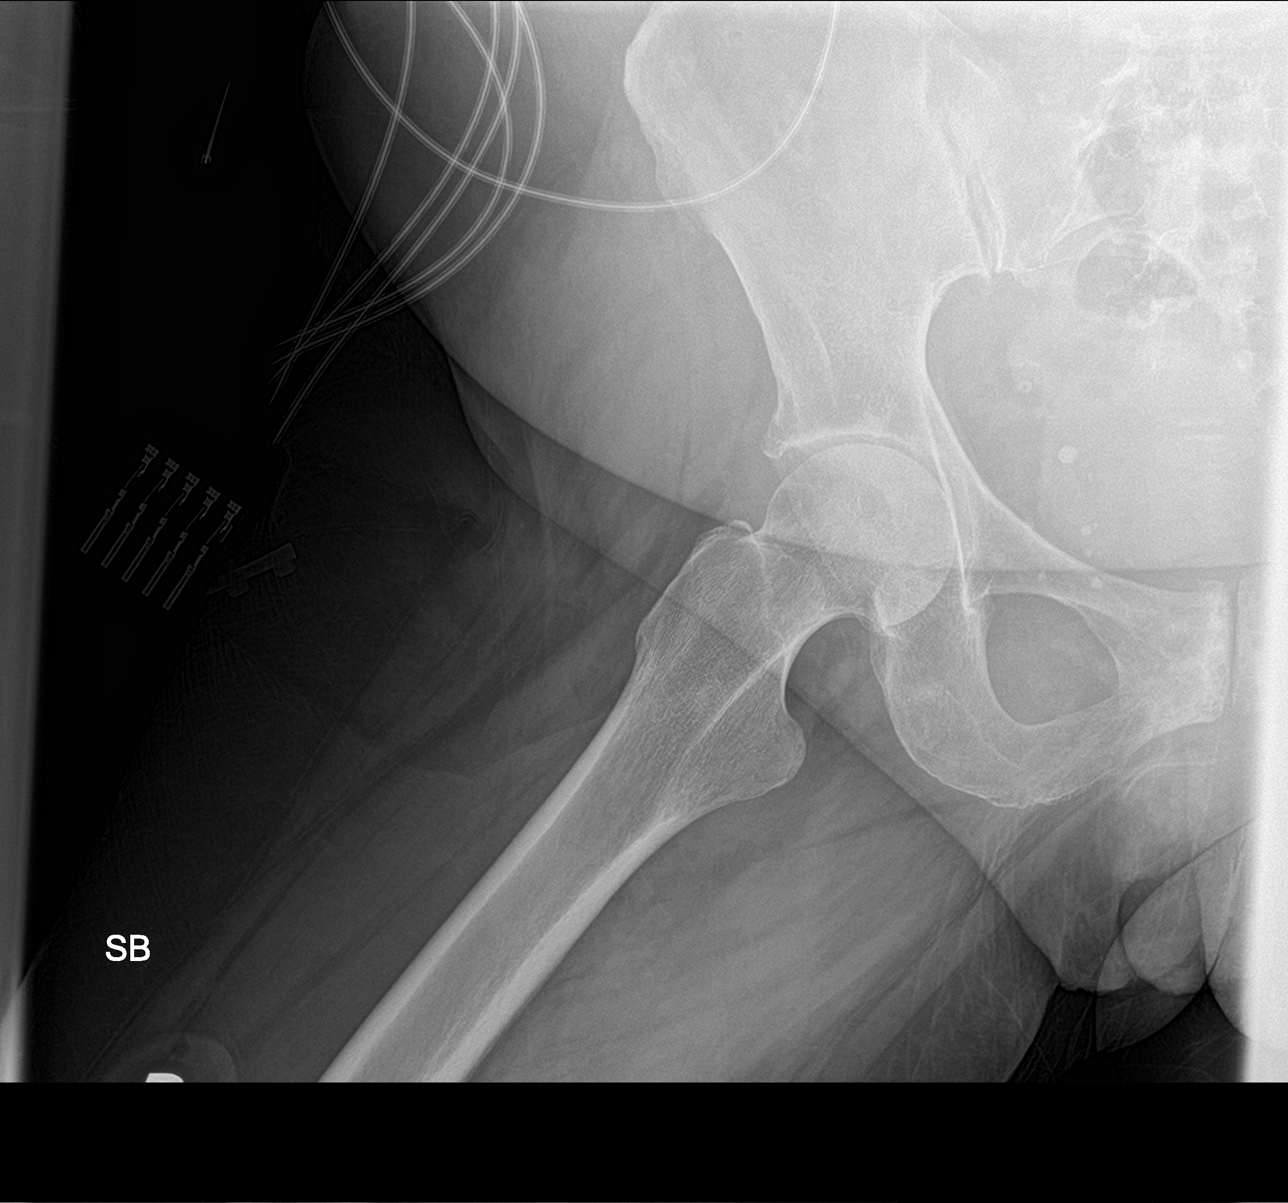

[3 of 3 positions shown; findings below may reference images not displayed]

FINDINGS: Frontal pelvis as well as frontal and lateral right hip images were
obtained. There is no appreciable fracture or dislocation. There is
moderate symmetric narrowing of each hip joint and pubic symphysis
region. No erosive changes. There are multiple phleboliths in the
pelvis.
IMPRESSION: Moderate symmetric narrowing of each hip joint. Moderate
osteoarthritic change in the pubic symphysis region. No acute
fracture or dislocation.

## 2021-09-11 IMAGING — CR DG FOOT COMPLETE 3+V*R*
1 series · 3 of 3 positions shown · non-contrast
Comparison: None.

CLINICAL DATA: Recent fall with foot pain, initial encounter

EXAM:
RIGHT FOOT COMPLETE - 3+ VIEW

[Series 1: dg foot complete right · 0.14mm/px · 3 of 3 slices shown]
[im 1/3]
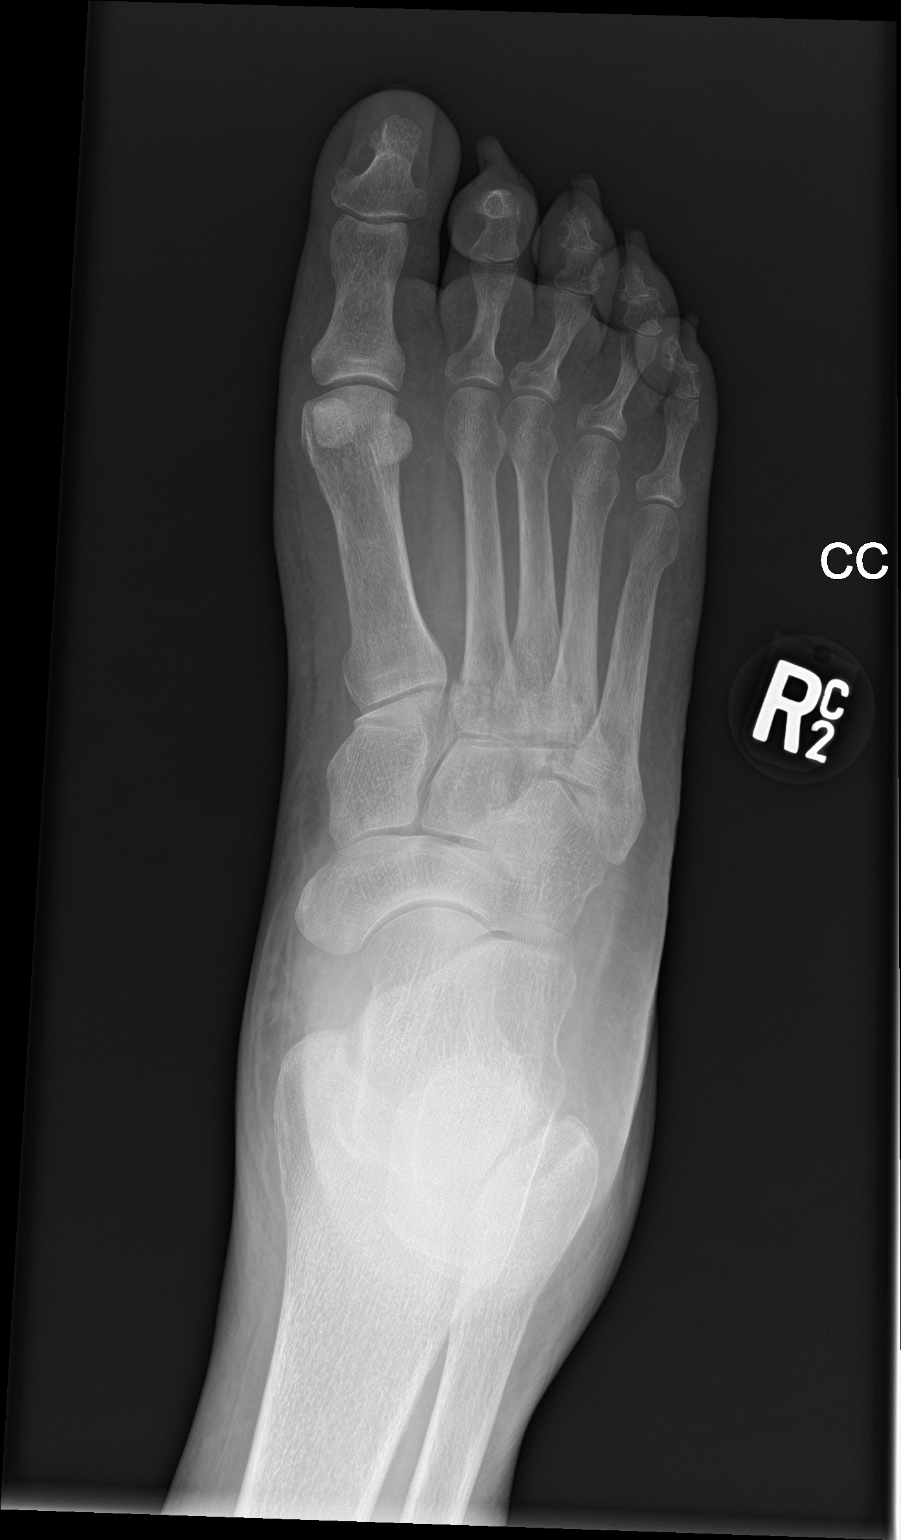
[im 2/3]
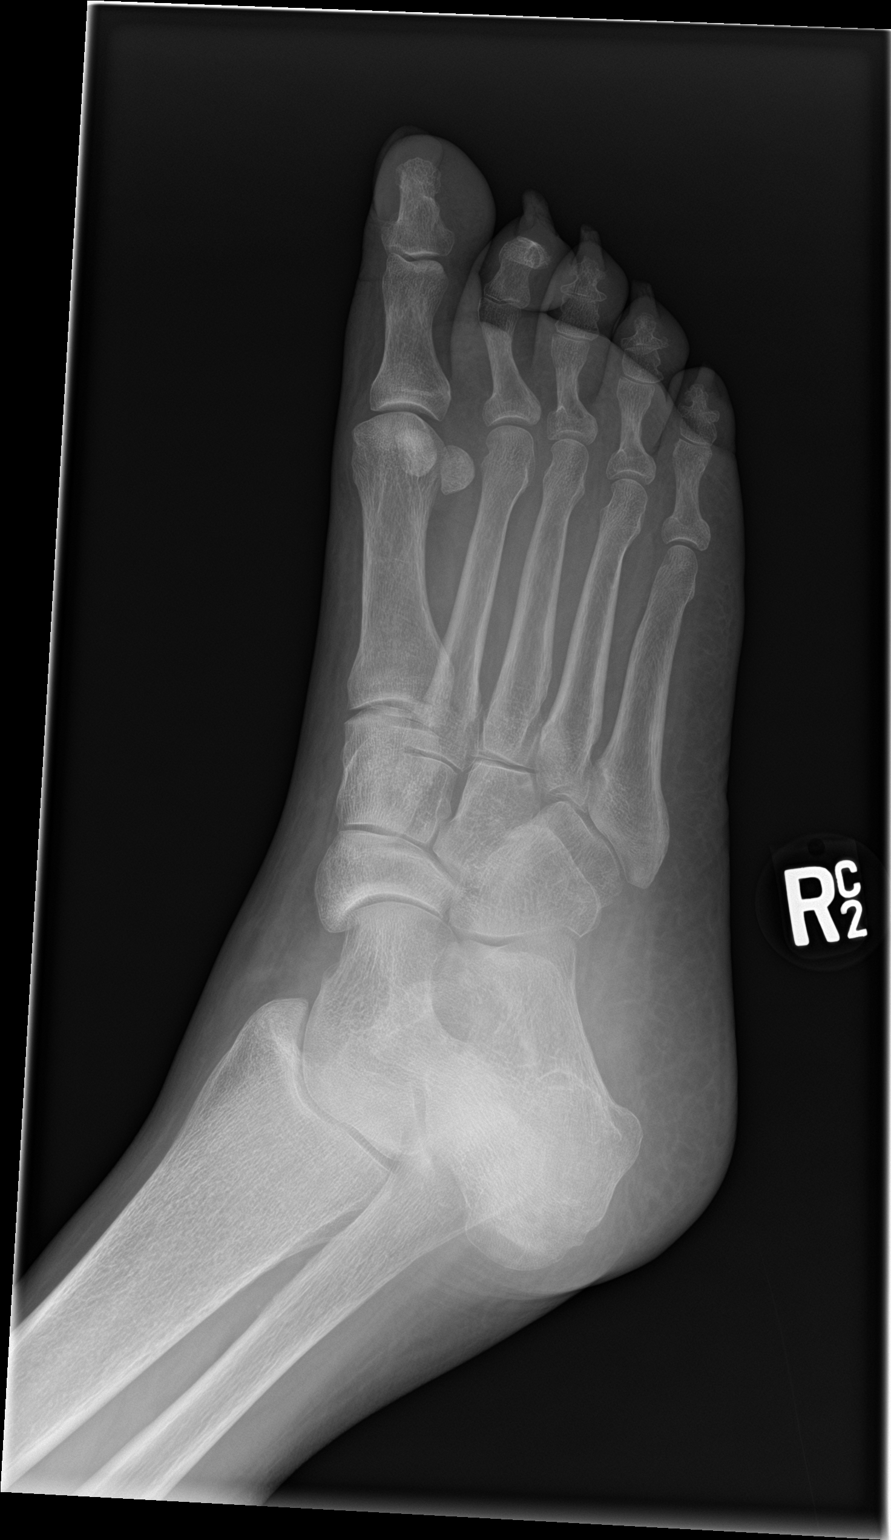
[im 3/3]
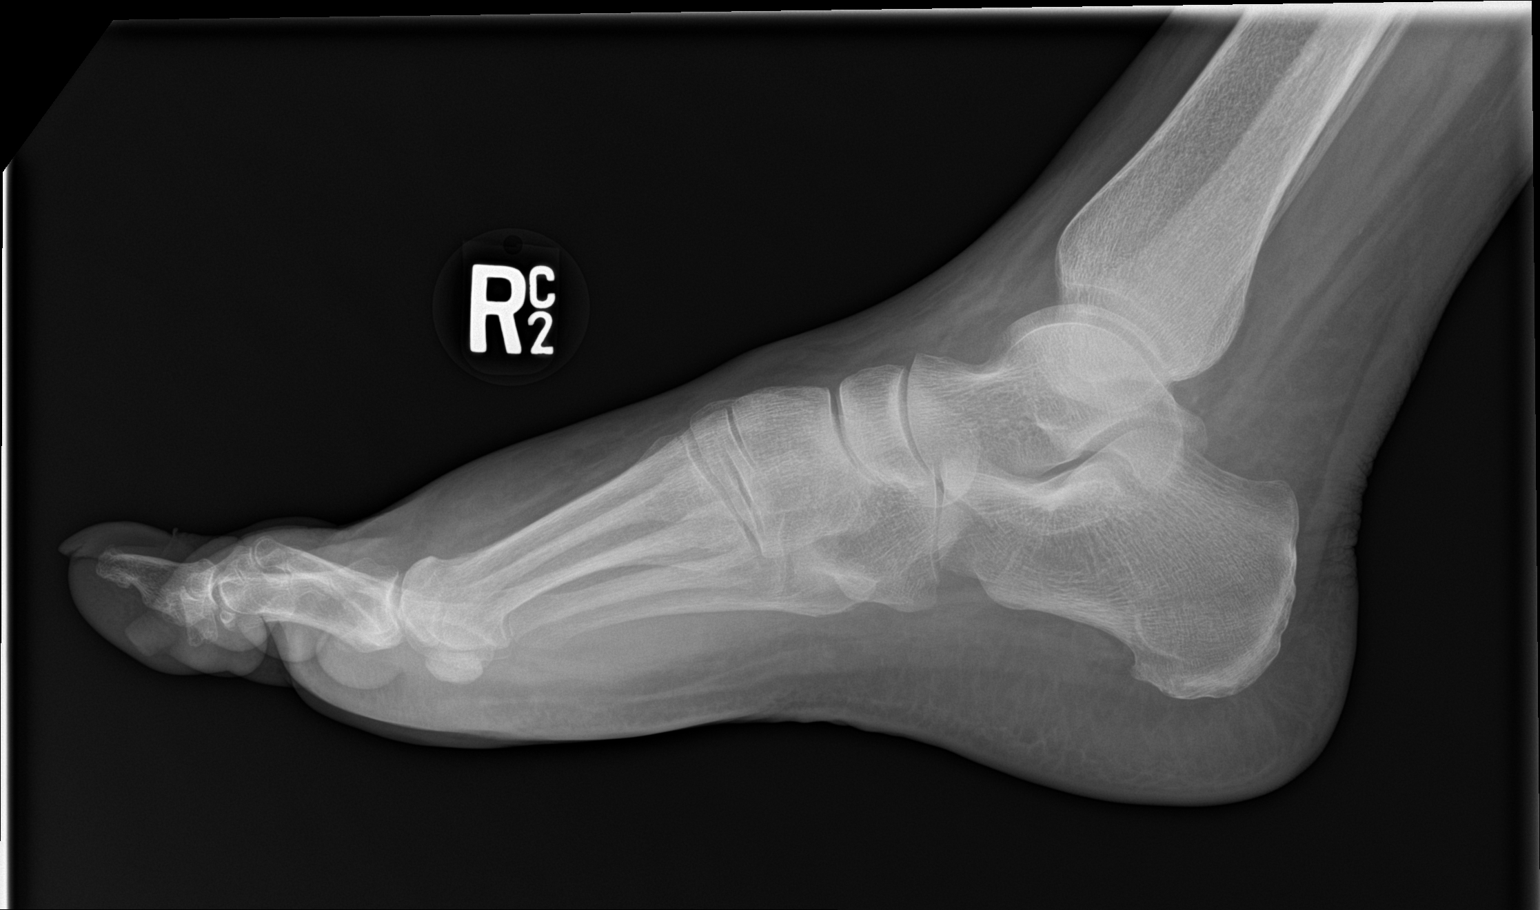

[3 of 3 positions shown; findings below may reference images not displayed]

FINDINGS: There is a minimally displaced fracture at the base of the second
metatarsal. No dislocation is seen. No other fractures are noted.
Mild soft tissue swelling is seen.
IMPRESSION: Fracture at the base of the second metatarsal.

## 2021-09-19 DIAGNOSIS — D649 Anemia, unspecified: Secondary | ICD-10-CM | POA: Diagnosis not present

## 2021-09-19 DIAGNOSIS — I10 Essential (primary) hypertension: Secondary | ICD-10-CM | POA: Diagnosis not present

## 2021-11-20 DIAGNOSIS — J449 Chronic obstructive pulmonary disease, unspecified: Secondary | ICD-10-CM | POA: Diagnosis not present

## 2021-11-20 DIAGNOSIS — F3181 Bipolar II disorder: Secondary | ICD-10-CM | POA: Diagnosis not present

## 2021-12-06 DIAGNOSIS — E559 Vitamin D deficiency, unspecified: Secondary | ICD-10-CM | POA: Diagnosis not present

## 2021-12-06 DIAGNOSIS — Z1159 Encounter for screening for other viral diseases: Secondary | ICD-10-CM | POA: Diagnosis not present

## 2021-12-06 DIAGNOSIS — F1290 Cannabis use, unspecified, uncomplicated: Secondary | ICD-10-CM | POA: Diagnosis not present

## 2021-12-06 DIAGNOSIS — I2699 Other pulmonary embolism without acute cor pulmonale: Secondary | ICD-10-CM | POA: Diagnosis not present

## 2021-12-06 DIAGNOSIS — Z79899 Other long term (current) drug therapy: Secondary | ICD-10-CM | POA: Diagnosis not present

## 2021-12-06 DIAGNOSIS — I2510 Atherosclerotic heart disease of native coronary artery without angina pectoris: Secondary | ICD-10-CM | POA: Diagnosis not present

## 2021-12-06 DIAGNOSIS — D6869 Other thrombophilia: Secondary | ICD-10-CM | POA: Diagnosis not present

## 2021-12-06 DIAGNOSIS — Z23 Encounter for immunization: Secondary | ICD-10-CM | POA: Diagnosis not present

## 2022-01-02 ENCOUNTER — Other Ambulatory Visit (HOSPITAL_BASED_OUTPATIENT_CLINIC_OR_DEPARTMENT_OTHER): Payer: Self-pay | Admitting: Registered Nurse

## 2022-01-02 ENCOUNTER — Other Ambulatory Visit: Payer: Self-pay | Admitting: Registered Nurse

## 2022-01-02 DIAGNOSIS — E2839 Other primary ovarian failure: Secondary | ICD-10-CM

## 2022-02-25 ENCOUNTER — Encounter (HOSPITAL_COMMUNITY): Payer: Self-pay

## 2022-02-25 ENCOUNTER — Emergency Department (HOSPITAL_COMMUNITY): Payer: Medicare Other

## 2022-02-25 ENCOUNTER — Emergency Department (HOSPITAL_COMMUNITY)
Admission: EM | Admit: 2022-02-25 | Discharge: 2022-02-26 | Disposition: A | Discharge status: Home / Self Care | Payer: Medicare Other | Attending: Emergency Medicine | Admitting: Emergency Medicine

## 2022-02-25 ENCOUNTER — Other Ambulatory Visit: Payer: Self-pay

## 2022-02-25 DIAGNOSIS — R0789 Other chest pain: Secondary | ICD-10-CM | POA: Insufficient documentation

## 2022-02-25 DIAGNOSIS — R079 Chest pain, unspecified: Secondary | ICD-10-CM | POA: Diagnosis present

## 2022-02-25 LAB — CBC
HCT: 38.4 % (ref 36.0–46.0)
Hemoglobin: 12.5 g/dL (ref 12.0–15.0)
MCH: 31.6 pg (ref 26.0–34.0)
MCHC: 32.6 g/dL (ref 30.0–36.0)
MCV: 97 fL (ref 80.0–100.0)
Platelets: 248 10*3/uL (ref 150–400)
RBC: 3.96 MIL/uL (ref 3.87–5.11)
RDW: 14.8 % (ref 11.5–15.5)
WBC: 7.5 10*3/uL (ref 4.0–10.5)
nRBC: 0.3 % — ABNORMAL HIGH (ref 0.0–0.2)

## 2022-02-25 LAB — BASIC METABOLIC PANEL
Anion gap: 10 (ref 5–15)
BUN: 19 mg/dL (ref 8–23)
CO2: 29 mmol/L (ref 22–32)
Calcium: 9.6 mg/dL (ref 8.9–10.3)
Chloride: 101 mmol/L (ref 98–111)
Creatinine, Ser: 1.66 mg/dL — ABNORMAL HIGH (ref 0.44–1.00)
GFR, Estimated: 32 mL/min — ABNORMAL LOW (ref >60)
Glucose, Bld: 104 mg/dL — ABNORMAL HIGH (ref 70–99)
Potassium: 3.5 mmol/L (ref 3.5–5.1)
Sodium: 140 mmol/L (ref 135–145)

## 2022-02-25 LAB — TROPONIN I (HIGH SENSITIVITY)
Troponin I (High Sensitivity): 6 ng/L (ref <18)
Troponin I (High Sensitivity): 6 ng/L (ref <18)

## 2022-02-25 NOTE — ED Notes (Signed)
EKG repeated at triage due to increasing CP , PA notified.

## 2022-02-25 NOTE — ED Provider Triage Note (Signed)
Emergency Medicine Provider Triage Evaluation Note  Jennifer Jacobs , a 77 y.o. female  was evaluated in triage.  Pt complains of chest pain.  She complains of pain in her right shoulder blade for the past several weeks.  It is now radiating around into her right chest wall.  She has pain that is worse with movement of the shoulder.  She is a smoker, history of MI, taking medications as directed, no shortness of breath.  Review of Systems  Positive: Chest pain Negative: Shortness of breath  Physical Exam  BP 103/82 (BP Location: Right Arm)   Pulse 62   Temp 98 F (36.7 C) (Oral)   Resp 15   SpO2 100%  Gen:   Awake, no distress   Resp:  Normal effort  MSK:   Moves extremities without difficulty  Other:  Chest wall tenderness  Medical Decision Making  Medically screening exam initiated at 12:37 PM.  Appropriate orders placed.  Jennifer Jacobs was informed that the remainder of the evaluation will be completed by another provider, this initial triage assessment does not replace that evaluation, and the importance of remaining in the ED until their evaluation is complete.  Work-up initiated   Arthor Captain, PA-C 02/25/22 1238

## 2022-02-25 NOTE — ED Triage Notes (Signed)
Patient complains of right sided chest pain radiating to right back, reports cough with same. No sob, alert and oriented

## 2022-02-26 ENCOUNTER — Emergency Department (HOSPITAL_COMMUNITY): Payer: Medicare Other

## 2022-02-26 ENCOUNTER — Encounter (HOSPITAL_COMMUNITY): Payer: Self-pay | Admitting: Emergency Medicine

## 2022-02-26 DIAGNOSIS — R0789 Other chest pain: Secondary | ICD-10-CM | POA: Diagnosis not present

## 2022-02-26 MED ORDER — METHOCARBAMOL 500 MG PO TABS
500.0000 mg | ORAL_TABLET | Freq: Once | ORAL | Status: AC
  Administered 2022-02-26: 500 mg via ORAL
  Filled 2022-02-26: qty 1

## 2022-02-26 MED ORDER — METHOCARBAMOL 500 MG PO TABS: 500.0000 mg | ORAL_TABLET | Freq: Two times a day (BID) | ORAL | 0 refills | Status: AC | PRN

## 2022-02-26 MED ORDER — OXYCODONE-ACETAMINOPHEN 5-325 MG PO TABS
1.0000 | ORAL_TABLET | Freq: Once | ORAL | Status: AC
  Administered 2022-02-26: 1 via ORAL
  Filled 2022-02-26: qty 1

## 2022-02-26 NOTE — ED Provider Notes (Signed)
[facility] EMERGENCY DEPARTMENT Provider Note   CSN: [id number] Arrival date & time: 02/25/22  1211     History  Chief Complaint  Patient presents with   Chest Pain    Jennifer Jacobs is a 76 y.o. female.  On my interview, patient is complaining of right periscapular back pain. Worse with certain motions and movements, usually better with pressure. Initially didn't know about any inciting incidents however ultimately remembers reaching for something in her closet just prior to this starting. No sob, nausea, diaphoresis, fever or cough. States that her ultram helps some with the pain.    Chest Pain      Home Medications Prior to Admission medications   Medication Sig Start Date End Date Taking? Authorizing Provider  methocarbamol (ROBAXIN) 500 MG tablet Take 1 tablet (500 mg total) by mouth 2 (two) times daily as needed for muscle spasms. 02/26/22  Yes Corin Formisano, Barbara Cower, MD  albuterol (VENTOLIN HFA) 108 (90 Base) MCG/ACT inhaler Inhale into the lungs every 6 (six) hours as needed for wheezing or shortness of breath.    [provider]  apixaban (ELIQUIS) 5 MG TABS tablet Take 5 mg by mouth daily.    [provider]  BESIVANCE 0.6 % SUSP Place 1 drop into the left eye 3 (three) times daily.  06/11/18   [provider]  DUREZOL 0.05 % EMUL Place 1 drop into the left eye 3 (three) times daily.  06/11/18   [provider]  escitalopram (LEXAPRO) 20 MG tablet Take 1 tablet (20 mg total) by mouth 2 (two) times daily. 03/18/18   Myrlene Broker, MD  gabapentin (NEURONTIN) 800 MG tablet Take 800 mg by mouth 3 (three) times daily.  11/05/17   [provider]  lamoTRIgine (LAMICTAL) 100 MG tablet Take 1 tablet (100 mg total) by mouth 2 (two) times daily. 03/18/18 12/18/19  Myrlene Broker, MD  metoprolol tartrate (LOPRESSOR) 25 MG tablet Take 1 tablet (25 mg total) by mouth 2 (two) times daily. 12/18/19 03/17/20  Antoine Poche, MD   olmesartan (BENICAR) 20 MG tablet Take 1 tablet (20 mg total) by mouth daily. 04/04/18   Antoine Poche, MD  oxybutynin (DITROPAN-XL) 10 MG 24 hr tablet Take 1 tablet (10 mg total) by mouth at bedtime. 12/10/17   Eustace Moore, MD  PROLENSA 0.07 % SOLN Place 1 drop into the left eye daily.  06/11/18   [provider]  risperiDONE (RISPERDAL) 0.5 MG tablet Take 1 tablet (0.5 mg total) by mouth at bedtime. 03/18/18   Myrlene Broker, MD  traMADol (ULTRAM) 50 MG tablet Take by mouth every 6 (six) hours as needed.    [provider]  traZODone (DESYREL) 50 MG tablet Take 1 tablet (50 mg total) by mouth at bedtime. 03/18/18   Myrlene Broker, MD      Allergies    Aspirin and Prozac [fluoxetine hcl]    Review of Systems   Review of Systems  Cardiovascular:  Positive for chest pain.    Physical Exam Updated Vital Signs BP 107/67 (BP Location: Right Arm)   Pulse 62   Temp 98 F (36.7 C) (Oral)   Resp 20   SpO2 99%  Physical Exam Vitals and nursing note reviewed.  Constitutional:      Appearance: She is well-developed.  HENT:     Head: Normocephalic and atraumatic.  Cardiovascular:     Rate and Rhythm: Normal rate and regular rhythm.  Pulmonary:     Effort: No respiratory distress.     Breath sounds: No stridor. No decreased breath sounds.  Chest:     Chest wall: No mass, deformity or tenderness.  Abdominal:     General: There is no distension.     Palpations: Abdomen is soft.  Musculoskeletal:        General: Normal range of motion.     Cervical back: Normal range of motion.  Neurological:     Mental Status: She is alert.     ED Results / Procedures / Treatments   Labs (all labs ordered are listed, but only abnormal results are displayed) Labs Reviewed  BASIC METABOLIC PANEL - Abnormal; Notable for the following components:      Result Value   Glucose, Bld 104 (*)    Creatinine, Ser 1.66 (*)    GFR, Estimated 32 (*)    All other components  within normal limits  CBC - Abnormal; Notable for the following components:   nRBC 0.3 (*)    All other components within normal limits  TROPONIN I (HIGH SENSITIVITY)  TROPONIN I (HIGH SENSITIVITY)    EKG EKG Interpretation  Date/Time:  Sunday February 25 2022 22:23:58 EDT Ventricular Rate:  58 PR Interval:  170 QRS Duration: 128 QT Interval:  428 QTC Calculation: 420 R Axis:   -58 Text Interpretation: Sinus bradycardia Left axis deviation Non-specific intra-ventricular conduction block Minimal voltage criteria for LVH, may be normal variant ( Cornell product ) T wave abnormality, consider lateral ischemia Abnormal ECG When compared with ECG of 25-Feb-2022 12:10, No significant change was found Confirmed by Dione Booze ([id number]) on 02/26/2022 2:46:08 AM  Radiology CT Chest Wo Contrast  Result Date: 02/26/2022 CLINICAL DATA:  Chest wall pain.  No known injury. EXAM: CT CHEST WITHOUT CONTRAST TECHNIQUE: Multidetector CT imaging of the chest was performed following the standard protocol without IV contrast. RADIATION DOSE REDUCTION: This exam was performed according to the departmental dose-optimization program which includes automated exposure control, adjustment of the mA and/or kV according to patient size and/or use of iterative reconstruction technique. COMPARISON:  01/13/2018 FINDINGS: Cardiovascular: Heart is normal size. Extensive coronary artery and moderate aortic calcifications. No aneurysm. Mediastinum/Nodes: No mediastinal, hilar, or axillary adenopathy. Trachea and esophagus are unremarkable. Thyroid unremarkable. Lungs/Pleura: No confluent airspace opacities or effusions. Upper Abdomen: No acute findings Musculoskeletal: Chest wall soft tissues are unremarkable. No acute bony abnormality. IMPRESSION: No acute cardiopulmonary disease. Coronary artery disease. No chest wall or rib abnormality to explain pain. Aortic Atherosclerosis (ICD10-I70.0). Electronically Signed   By: Charlett Nose  M.D.   On: 02/26/2022 02:00   DG Chest 2 View  Result Date: 02/25/2022 CLINICAL DATA:  Chest pain. EXAM: CHEST - 2 VIEW COMPARISON:  Chest x-ray Jan 10, 2018. FINDINGS: The heart size and mediastinal contours are within normal limits. Mild streaky left basilar opacities. No confluent consolidation. No visible pleural effusions or pneumothorax. No acute osseous abnormality. IMPRESSION: Mild streaky left basilar opacities, favor atelectasis. Electronically Signed   By: Feliberto Harts M.D.   On: 02/25/2022 14:21    Procedures Procedures    Medications Ordered in ED Medications  oxyCODONE-acetaminophen (PERCOCET/ROXICET) 5-325 MG per tablet 1 tablet (1 tablet Oral Given 02/26/22 0125)  methocarbamol (ROBAXIN) tablet 500 mg (500 mg Oral Given 02/26/22 0125)    ED Course/ Medical Decision Making/ A&P  Medical Decision Making Amount and/or Complexity of Data Reviewed Radiology: ordered.  Risk Prescription drug management.   Ct to eval for pneumonia, bony pain and reassuring. Seems to be more MSK in nature and had significant improvement/relief with medications here. Will d/c on continued robaxin w/ pcp follow up.    Final Clinical Impression(s) / ED Diagnoses Final diagnoses:  Chest wall pain    Rx / DC Orders ED Discharge Orders          Ordered    methocarbamol (ROBAXIN) 500 MG tablet  2 times daily PRN        02/26/22 0233              Chantal Worthey, Barbara Cower, MD 02/26/22 6144

## 2022-06-11 ENCOUNTER — Other Ambulatory Visit: Payer: Self-pay | Admitting: Registered Nurse

## 2022-06-11 DIAGNOSIS — Z1231 Encounter for screening mammogram for malignant neoplasm of breast: Secondary | ICD-10-CM

## 2022-06-19 ENCOUNTER — Inpatient Hospital Stay: Admission: RE | Admit: 2022-06-19 | Payer: Medicare HMO | Source: Ambulatory Visit

## 2022-09-26 ENCOUNTER — Emergency Department (HOSPITAL_COMMUNITY)
Admission: EM | Admit: 2022-09-26 | Discharge: 2022-09-26 | Disposition: A | Discharge status: Home / Self Care | Payer: 59 | Attending: Emergency Medicine | Admitting: Emergency Medicine

## 2022-09-26 ENCOUNTER — Encounter (HOSPITAL_COMMUNITY): Payer: Self-pay

## 2022-09-26 DIAGNOSIS — Z7901 Long term (current) use of anticoagulants: Secondary | ICD-10-CM | POA: Diagnosis not present

## 2022-09-26 DIAGNOSIS — Z0000 Encounter for general adult medical examination without abnormal findings: Secondary | ICD-10-CM | POA: Diagnosis present

## 2022-09-26 HISTORY — DX: Encounter for other specified prophylactic measures: Z29.89

## 2022-09-26 NOTE — ED Triage Notes (Signed)
Pt states her home health nurse was taking her BP and noted it to be 94/50. Pt referred to ED for this, no hx of same. Pt denies CP, SOB, dizziness, fatigue. Pt without complaint in triage.

## 2022-09-26 NOTE — ED Provider Notes (Signed)
Jennifer Jacobs   CSN: [id number] Arrival date & time: 09/26/22  1605     History  Chief Complaint  Patient presents with   Hypotension    Jennifer Jacobs is a 77 y.o. female who presents to the ED for evaluation after her home health nurse measured her blood pressure this morning as 94/50.  She states that at the time of measuring her blood pressure she felt her baseline without any symptoms.  She is on metoprolol 50 mg for blood pressure but no other antihypertensives at home.  She denies any recent or current chest pain, shortness of breath, dizziness, lightheadedness, weakness, fatigue, vision changes, headache, syncope, abdominal pain, nausea, vomiting, fever, or any other concerns.  Patient states that she is feeling well.  Brother at bedside confirms that patient has been behaving normally without any complaints.  Patient denies any recent illness.     Home Medications Prior to Admission medications   Medication Sig Start Date End Date Taking? Authorizing Provider  albuterol (VENTOLIN HFA) 108 (90 Base) MCG/ACT inhaler Inhale into the lungs every 6 (six) hours as needed for wheezing or shortness of breath.    [provider]  apixaban (ELIQUIS) 5 MG TABS tablet Take 5 mg by mouth daily.    [provider]  BESIVANCE 0.6 % SUSP Place 1 drop into the left eye 3 (three) times daily.  06/11/18   [provider]  DUREZOL 0.05 % EMUL Place 1 drop into the left eye 3 (three) times daily.  06/11/18   [provider]  escitalopram (LEXAPRO) 20 MG tablet Take 1 tablet (20 mg total) by mouth 2 (two) times daily. 03/18/18   Cloria Spring, MD  gabapentin (NEURONTIN) 800 MG tablet Take 800 mg by mouth 3 (three) times daily.  11/05/17   [provider]  lamoTRIgine (LAMICTAL) 100 MG tablet Take 1 tablet (100 mg total) by mouth 2 (two) times daily. 03/18/18 12/18/19  Cloria Spring, MD   methocarbamol (ROBAXIN) 500 MG tablet Take 1 tablet (500 mg total) by mouth 2 (two) times daily as needed for muscle spasms. 02/26/22   Mesner, Corene Cornea, MD  metoprolol tartrate (LOPRESSOR) 25 MG tablet Take 1 tablet (25 mg total) by mouth 2 (two) times daily. 12/18/19 03/17/20  [name], MD  olmesartan (BENICAR) 20 MG tablet Take 1 tablet (20 mg total) by mouth daily. 04/04/18   [name], MD  oxybutynin (DITROPAN-XL) 10 MG 24 hr tablet Take 1 tablet (10 mg total) by mouth at bedtime. 12/10/17   Raylene Everts, MD  PROLENSA 0.07 % SOLN Place 1 drop into the left eye daily.  06/11/18   [provider]  risperiDONE (RISPERDAL) 0.5 MG tablet Take 1 tablet (0.5 mg total) by mouth at bedtime. 03/18/18   Cloria Spring, MD  traMADol (ULTRAM) 50 MG tablet Take by mouth every 6 (six) hours as needed.    [provider]  traZODone (DESYREL) 50 MG tablet Take 1 tablet (50 mg total) by mouth at bedtime. 03/18/18   Cloria Spring, MD      Allergies    Aspirin and Prozac [fluoxetine hcl]    Review of Systems   Review of Systems  Constitutional:  Negative for activity change, appetite change, chills, diaphoresis and fever.  HENT:  Negative for congestion, ear pain and sore throat.   Eyes:  Negative for photophobia, pain and visual disturbance.  Respiratory:  Negative for cough and shortness of breath.   Cardiovascular:  Negative for chest pain, palpitations and leg swelling.  Gastrointestinal:  Negative for abdominal pain, diarrhea, nausea and vomiting.  Genitourinary:  Negative for dysuria and hematuria.  Musculoskeletal:  Negative for arthralgias, back pain, gait problem, neck pain and neck stiffness.  Skin:  Negative for color change, rash and wound.  Neurological:  Negative for dizziness, tremors, seizures, syncope, facial asymmetry, speech difficulty, weakness, light-headedness, numbness and headaches.  Psychiatric/Behavioral:  Negative for confusion.   All other  systems reviewed and are negative.   Physical Exam Updated Vital Signs BP 106/65   Pulse 69   Temp 98.3 F (36.8 C) (Oral)   Resp 16   SpO2 93%  Physical Exam Vitals and nursing Jacobs reviewed.  Constitutional:      General: She is not in acute distress.    Appearance: Normal appearance. She is not ill-appearing, toxic-appearing or diaphoretic.  HENT:     Head: Normocephalic and atraumatic.     Mouth/Throat:     Mouth: Mucous membranes are moist.  Eyes:     Extraocular Movements: Extraocular movements intact.     Conjunctiva/sclera: Conjunctivae normal.     Pupils: Pupils are equal, round, and reactive to light.  Cardiovascular:     Rate and Rhythm: Normal rate and regular rhythm.     Heart sounds: No murmur heard. Pulmonary:     Effort: Pulmonary effort is normal. No respiratory distress.     Breath sounds: Normal breath sounds. No stridor. No wheezing, rhonchi or rales.  Abdominal:     General: Abdomen is flat.     Palpations: Abdomen is soft.     Tenderness: There is no abdominal tenderness. There is no guarding or rebound.  Musculoskeletal:        General: Normal range of motion.     Cervical back: Normal range of motion and neck supple. No rigidity.     Right lower leg: No edema.     Left lower leg: No edema.  Skin:    General: Skin is warm and dry.     Capillary Refill: Capillary refill takes less than 2 seconds.     Coloration: Skin is not jaundiced or pale.  Neurological:     General: No focal deficit present.     Mental Status: She is alert and oriented to person, place, and time.     Cranial Nerves: No cranial nerve deficit.     Sensory: No sensory deficit.     Motor: No weakness.     Coordination: Coordination normal.  Psychiatric:        Mood and Affect: Mood normal.        Behavior: Behavior normal.        Thought Content: Thought content normal.        Judgment: Judgment normal.     ED Results / Procedures / Treatments   Labs (all labs ordered  are listed, but only abnormal results are displayed) Labs Reviewed - No data to display  EKG Sinus rhythm at 68 bpm, no acute ST-T changes, no STEMI  Radiology No results found.  Procedures Procedures    Medications Ordered in ED Medications - No data to display  ED Course/ Medical Decision Making/ A&P                             Medical Decision Making  Medical Decision Making:   Jennifer So  Jerilynn Mages Jacobs is a 77 y.o. female who presented to the ED today with concern for hypotension as detailed above.    Additional history discussed with patient's family/caregivers.  Complete initial physical exam performed, notably the patient  was neurologically intact with no complaints.    Reviewed and confirmed nursing documentation for past medical history, family history, social history.    Initial Assessment:   Differential diagnoses were considered including (but not limited to) lab error, CVA, TIA, ACS, acute infection, arrhythmia. These are considered less likely due to history of present illness and physical exam findings.     Initial Plan:  Physical examination as above EKG to evaluate for cardiac pathology Discussed further lab testing or imaging with patient/brother that patient is currently asymptomatic may opt to decline this at this time Blood pressure monitoring and patient reassessments over 1 hour  Initial Study Results:   Laboratory  EKG EKG was reviewed independently.  No medically relevant abnormality or arrhythmia. ST segments without concerns for elevations.    Vitals:   09/26/22 1613 09/26/22 1615 09/26/22 1630 09/26/22 1645  BP: (!) 143/86 110/84 111/67 118/77   09/26/22 1700 09/26/22 1715  BP: 108/77 106/65     Final Assessment and Plan:   This is a 77 year old female presenting to the ED for evaluation of episode of hypotension by home health nurse this morning.  Patient believes that measurement was not accurate when obtained and has been asymptomatic but  came to the ED for further evaluation at nursing insistence.  Patient has a history of very mild hypertension for which she takes metoprolol 50 mg daily but no other antihypertensives.  On arrival to ED, patient has blood pressure reading of 143/86.  Multiple follow-up blood pressures reveal no hypotension and patient remains asymptomatic.  Discussed possibility of further evaluation by labs or imaging with patient/brother do not desire this at this time and I believe that is reasonable with patient's reassuring exam.  She is neurologically intact.  Observed patient for an hour with repeat blood pressure measurements that remained stable and she remained asymptomatic.  With this and patient desiring discharge home, I will proceed with discharging her that she is given strict ED return precautions and instructed to measure blood pressure at home daily and follow-up with her PCP later this week.  Patient expressed understanding of plan and stable at time of discharge.   Clinical Impression:  1. Normal exam      Discharge           Final Clinical Impression(s) / ED Diagnoses Final diagnoses:  Normal exam    Rx / DC Orders ED Discharge Orders     None         Turner Daniels 09/26/22 2207    Dorie Rank, MD 09/27/22 0001

## 2022-09-26 NOTE — Discharge Instructions (Signed)
Thank you for letting us take care of you today.  Your blood pressure readings in the ED are reassuring and as you had no current symptoms I believe it is reasonable to send you home at this time.  If you develop any new symptoms or concerns such as chest pain, shortness of breath, lightheadedness, dizziness, confusion, or other concerns, please present to nearest emergency department for reevaluation.  I recommend that you follow-up with your PCP later this week to have your blood pressure rechecked in the office and to discuss your ED visit today.  I also have provided some information for measuring your blood pressure and recording it.  I recommend that you do this approximately 1 time daily at the same time each day and keep a log to take to your PCP.  If you notice that your blood pressure is dropping below systolic (top number) of 90 or below diastolic (bottom number) of 60 regularly and/or you have symptoms such as those mentioned above, I recommend that you be reevaluated immediately.

## 2023-01-17 ENCOUNTER — Other Ambulatory Visit: Payer: Self-pay | Admitting: Registered Nurse

## 2023-01-17 DIAGNOSIS — Z9889 Other specified postprocedural states: Secondary | ICD-10-CM

## 2023-01-17 DIAGNOSIS — R519 Headache, unspecified: Secondary | ICD-10-CM

## 2023-01-21 ENCOUNTER — Ambulatory Visit (INDEPENDENT_AMBULATORY_CARE_PROVIDER_SITE_OTHER): Payer: 59 | Admitting: Sports Medicine

## 2023-01-21 ENCOUNTER — Encounter: Payer: Self-pay | Admitting: Sports Medicine

## 2023-01-21 ENCOUNTER — Other Ambulatory Visit (INDEPENDENT_AMBULATORY_CARE_PROVIDER_SITE_OTHER): Payer: 59

## 2023-01-21 DIAGNOSIS — G8929 Other chronic pain: Secondary | ICD-10-CM

## 2023-01-21 DIAGNOSIS — G8194 Hemiplegia, unspecified affecting left nondominant side: Secondary | ICD-10-CM

## 2023-01-21 DIAGNOSIS — I6930 Unspecified sequelae of cerebral infarction: Secondary | ICD-10-CM | POA: Diagnosis not present

## 2023-01-21 DIAGNOSIS — M545 Low back pain, unspecified: Secondary | ICD-10-CM

## 2023-01-21 DIAGNOSIS — R2681 Unsteadiness on feet: Secondary | ICD-10-CM | POA: Diagnosis not present

## 2023-01-21 DIAGNOSIS — M1712 Unilateral primary osteoarthritis, left knee: Secondary | ICD-10-CM

## 2023-01-21 DIAGNOSIS — M4316 Spondylolisthesis, lumbar region: Secondary | ICD-10-CM

## 2023-01-21 NOTE — Progress Notes (Signed)
Jennifer Jacobs - 77 y.o. female MRN [id number]  Date of birth: 16-Jul-1946  Office Visit Note: Visit Date: 01/21/2023 PCP: Loura Back, NP Referred by: Loura Back, NP  Subjective: Chief Complaint  Patient presents with   Lower Back - Pain   Left Leg - Pain   Right Leg - Pain   HPI: Jennifer Jacobs is a pleasant 77 y.o. female who presents today for low back pain, left knee pain but bilateral leg weakness.  Patient was referred by her primary physician for knee pain, however Danine states today that both of her legs feel weak, the left is greater than the right.  Pain starts over the hips and will go down the legs.  She denies any numbness tingling or weakness however.  She feels she has weakness but also an unsteady gait and balance issues.  She does report a history of a stroke years ago which left her with left-sided hemiparesis.  Did do physical therapy/Occupational Therapy in the past but never felt like she completely recovered.  At times she does have low back pain as well.  She was recently started on hydrocodone twice daily.  She also takes Tylenol.  She does use the assistance of a walker.  She has had multiple falls.    She has a history of CAD, NSTEMI, PE, stroke, currently on Eliquis 5 mg daily.  Chart reviewed showed cognitive deficit and left-sided deficit from stroke in April 2018.  Pertinent ROS were reviewed with the patient and found to be negative unless otherwise specified above in HPI.   Assessment & Plan: Visit Diagnoses:  1. Chronic midline low back pain without sciatica   2. History of CVA with residual deficit   3. Left hemiparesis (HCC)   4. Unsteady gait when walking   5. Unilateral primary osteoarthritis, left knee   6. Anterolisthesis of lumbar spine    Plan: Kasheena has bilateral leg pain and weakness, with left-sided hemiparesis and left leg weakness from a prior stroke years ago.  X-rays show a mild anterior listhesis of L4 and L5 and at  least moderate knee arthritis of the left knee in the medial compartment.  I do think these are contributing to some of her pain, but her biggest issue is balance and left-sided weakness which I think is more a result of her prior stroke.  Discussed with her treatment options, the most important would be getting her into formalized physical therapy/Occupational Therapy to work on her left-sided deficits.  I discussed with her today I am not sure how much strength and function she will be able to recover from her prior stroke but it think we can certainly improved her gait and her balance issues. She may continue her hydrocodone twice daily for pain control.  She may continue her gabapentin (800mg ) 3 times daily as well.  I would like to see what sort of progress she makes with her gait and her muscle imbalance after about 6-8 weeks of physical therapy, we will follow-up at that point.  Additional considerations may be including getting dedicated hip and knee knee x-rays that are weightbearing.  Follow-up: Return in about 2 months (around 03/23/2023) for for bilateral leg pain and left sided weakness.   Meds & Orders: No orders of the defined types were placed in this encounter.   Orders Placed This Encounter  Procedures   XR Lumbar Spine Complete   Ambulatory referral to Physical Therapy     Procedures: No procedures  performed      Clinical History: No specialty comments available.  She reports that she has been smoking cigarettes. She has been smoking an average of .5 packs per day. She has never used smokeless tobacco. No results for input(s): "HGBA1C", "LABURIC" in the last 8760 hours.  Objective:   Vital Signs: There were no vitals taken for this visit.  Physical Exam  Gen: Well-appearing, in no acute distress; non-toxic CV: Well-perfused. Warm.  Resp: Breathing unlabored on room air; no wheezing. Psych: Fluid speech in conversation; appropriate affect; normal thought process Neuro:  Sensation intact throughout. No gross coordination deficits.   Ortho Exam - Ambulates with the assistance of a walker.  - Low back: No midline spinous process TTP.  There is slightly limited range of motion in flexion.  Negative modified slump's testing.  There is left-sided weakness, but not coming from the back based on exam.  - Hips/Knees: The hips moves rather fluidly with no blocks to internal/external rotation with logroll.  Left side does have a positive FABER testing, negative FADIR.  There is 4/5 weakness with hip flexion, knee extension and ankle dorsiflexion.  There is some mild weakness on the right with hip flexion and lateral abduction, otherwise full strength.   Imaging: XR Lumbar Spine Complete  Result Date: 01/21/2023 4 views of the lumbar spine including AP, lateral and flexion and extension views were ordered and reviewed by myself.  X-rays demonstrate degenerative disc disease of the lower spine between L4 and S1, most notably at L5-S1 with moderate change.  There is facet arthropathy at L5 and to a lesser degree L4.  There is grade 1 anterior listhesis of L4 on L5 which does not significantly change with flexion and extension views.   Past Medical/Family/Surgical/Social History: Medications & Allergies reviewed per EMR, new medications updated. Patient Active Problem List   Diagnosis Date Noted   Acute pulmonary embolism (HCC) 01/13/2018   Chest pain 01/11/2018   Elevated troponin 01/11/2018   AKI (acute kidney injury) (HCC) 01/11/2018   NSTEMI (non-ST elevated myocardial infarction) (HCC) 01/11/2018   Vascular headache    Seizure prophylaxis    Weakness of left third cranial nerve    Gait disturbance, post-stroke 11/22/2016   Benign essential HTN    Seizure disorder (HCC)    Acute blood loss anemia    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   Bilateral leg pain 06/26/2016   AAA (abdominal aortic aneurysm) without rupture (HCC) 06/26/2016   Insomnia  06/07/2015   Urinary incontinence 06/07/2015   Chronic pain syndrome 03/04/2015   Fibromyalgia 03/04/2015   Cervical radiculitis 03/04/2015   Lumbar radicular pain 03/04/2015   Major depression 07/05/2014   Claudication, class I (HCC) 03/18/2013   Smoker unmotivated to quit    Past Medical History:  Diagnosis Date   Acute blood loss anemia    AKI (acute kidney injury) (HCC) 01/11/2018   Alcohol abuse 07/05/2014   Allergy    Anxiety    Anxiety and depression    Arthritis    Cognitive deficit due to recent stroke 11/22/2016   COPD (chronic obstructive pulmonary disease) (HCC)    Depression    Dyslipidemia    Fibromyalgia    Hepatic cyst    Hyperlipidemia    Hypertension    Leukocytosis    Liver cyst 03/18/2014   01/26/2014 ultrasound Hypoechoic structure adjacent to gallbladder right lobe liver,  measuring 29 x 18 x 17 mm, possibly septated cyst     MI (  myocardial infarction) (HCC) 2007   By patient report, not substantiated by recent nuclear stress test.   Neuromuscular disorder (HCC)    Seizure disorder (HCC)    none since 2002   Seizure prophylaxis    Seizures (HCC)    Smoker unmotivated to quit    Quit for 3 years and then restarted   Substance abuse (HCC)    ETOH   Vision abnormalities    Family History  Problem Relation Age of Onset   Heart attack Father 72   Hypertension Father    Alcohol abuse Father    Heart attack Maternal Grandmother    Cancer Maternal Grandfather        type unknown   Cancer Paternal Grandmother        type unknown   Depression Mother    Alcohol abuse Mother    Ovarian cancer Paternal Aunt    Cancer Paternal Aunt        ovarian   Prostate cancer Paternal Uncle    Stomach cancer Paternal Aunt    Diabetes Daughter    Heart disease Daughter 49       heart attack   Depression Sister    Alcohol abuse Brother    Depression Sister    Alcohol abuse Sister    Depression Sister    Alcohol abuse Sister    Alcohol abuse Brother     Alcohol abuse Son    Past Surgical History:  Procedure Laterality Date   CATARACT EXTRACTION W/PHACO Right 06/30/2018   Procedure: CATARACT EXTRACTION PHACO AND INTRAOCULAR LENS PLACEMENT RIGHT EYE;  Surgeon: Gemma Payor, MD;  Location: [facility];  Service: Ophthalmology;  Laterality: Right;  CDE: 9.90   CATARACT EXTRACTION W/PHACO Left 07/14/2018   Procedure: CATARACT EXTRACTION PHACO AND INTRAOCULAR LENS PLACEMENT (IOC);  Surgeon: Gemma Payor, MD;  Location: [facility];  Service: Ophthalmology;  Laterality: Left;  CDE: 6.96   CRANIOTOMY Left 11/19/2016   Procedure: CRANIOTOMY INTRACRANIAL FOR  ANEURYSM CLIPPING;  Surgeon: Loura Halt Ditty, MD;  Location: [facility];  Service: Neurosurgery;  Laterality: Left;   ELBOW ARTHROPLASTY Left    rods   Exercise tolerance test  04/27/2013   CPET-MET: Submaximal effort (0.96, with goal of greater than 1.09) -- patient states that her effort was limited due to knee pain.  This makes the rest of the interpretation difficult: Peak VO2 - 10.5 (59% moderate), peak 02-pulse -  7..11 (low); heart rate 114 (73% -chronic incompetence considered);; PFTs normal    MOUTH SURGERY     NM MYOVIEW LTD  05/19/2013   LexiScan: EF 80%, no evidence of ischemia or infarction   TRANSTHORACIC ECHOCARDIOGRAM  03/25/2013   EF 55-60%, no regional wall motion abnormalities; normal diastolic parameters, normal pulmonary pressures.  No valvular lesions noted.  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

## 2023-01-21 NOTE — Progress Notes (Signed)
PTBilateral leg/ low back pain Pain is from her hips to feet Takes hydrocodone for pain which helps Has not had any recent imaging, no PT

## 2023-01-29 ENCOUNTER — Ambulatory Visit: Payer: 59 | Attending: Sports Medicine | Admitting: Physical Therapy

## 2023-01-29 VITALS — BP 104/77 | HR 54

## 2023-01-29 DIAGNOSIS — M25562 Pain in left knee: Secondary | ICD-10-CM | POA: Insufficient documentation

## 2023-01-29 DIAGNOSIS — R2681 Unsteadiness on feet: Secondary | ICD-10-CM

## 2023-01-29 DIAGNOSIS — M6281 Muscle weakness (generalized): Secondary | ICD-10-CM | POA: Diagnosis present

## 2023-01-29 DIAGNOSIS — R296 Repeated falls: Secondary | ICD-10-CM

## 2023-01-29 DIAGNOSIS — M545 Low back pain, unspecified: Secondary | ICD-10-CM | POA: Diagnosis not present

## 2023-01-29 DIAGNOSIS — M25561 Pain in right knee: Secondary | ICD-10-CM | POA: Diagnosis present

## 2023-01-29 DIAGNOSIS — I6930 Unspecified sequelae of cerebral infarction: Secondary | ICD-10-CM | POA: Insufficient documentation

## 2023-01-29 DIAGNOSIS — G8929 Other chronic pain: Secondary | ICD-10-CM

## 2023-01-29 DIAGNOSIS — M5459 Other low back pain: Secondary | ICD-10-CM

## 2023-01-29 NOTE — Therapy (Signed)
OUTPATIENT PHYSICAL THERAPY NEURO EVALUATION   Patient Name: Jennifer Jacobs MRN: [id number] DOB:Oct 05, 1945, 77 y.o., female Today's Date: 01/29/2023   PCP: Loura Back, NP REFERRING PROVIDER: Madelyn Brunner, DO  END OF SESSION:  PT End of Session - 01/29/23 1423     Visit Number 1    Number of Visits 13   Plus eval   Date for PT Re-Evaluation 03/12/23    Authorization Type [employer]    PT Start Time 1420    PT Stop Time 1501    PT Time Calculation (min) 41 min    Activity Tolerance Patient tolerated treatment well    Behavior During Therapy WFL for tasks assessed/performed             Past Medical History:  Diagnosis Date   Acute blood loss anemia    AKI (acute kidney injury) (HCC) 01/11/2018   Alcohol abuse 07/05/2014   Allergy    Anxiety    Anxiety and depression    Arthritis    Cognitive deficit due to recent stroke 11/22/2016   COPD (chronic obstructive pulmonary disease) (HCC)    Depression    Dyslipidemia    Fibromyalgia    Hepatic cyst    Hyperlipidemia    Hypertension    Leukocytosis    Liver cyst 03/18/2014   01/26/2014 ultrasound Hypoechoic structure adjacent to gallbladder right lobe liver,  measuring 29 x 18 x 17 mm, possibly septated cyst     MI (myocardial infarction) (HCC) 2007   By patient report, not substantiated by recent nuclear stress test.   Neuromuscular disorder (HCC)    Seizure disorder (HCC)    none since 2002   Seizure prophylaxis    Seizures (HCC)    Smoker unmotivated to quit    Quit for 3 years and then restarted   Substance abuse (HCC)    ETOH   Vision abnormalities    Past Surgical History:  Procedure Laterality Date   CATARACT EXTRACTION W/PHACO Right 06/30/2018   Procedure: CATARACT EXTRACTION PHACO AND INTRAOCULAR LENS PLACEMENT RIGHT EYE;  Surgeon: Gemma Payor, MD;  Location: [facility];  Service: Ophthalmology;  Laterality: Right;  CDE: 9.90   CATARACT EXTRACTION W/PHACO Left 07/14/2018   Procedure: CATARACT EXTRACTION  PHACO AND INTRAOCULAR LENS PLACEMENT (IOC);  Surgeon: Gemma Payor, MD;  Location: [facility];  Service: Ophthalmology;  Laterality: Left;  CDE: 6.96   CRANIOTOMY Left 11/19/2016   Procedure: CRANIOTOMY INTRACRANIAL FOR  ANEURYSM CLIPPING;  Surgeon: Loura Halt Ditty, MD;  Location: [facility];  Service: Neurosurgery;  Laterality: Left;   ELBOW ARTHROPLASTY Left    rods   Exercise tolerance test  04/27/2013   CPET-MET: Submaximal effort (0.96, with goal of greater than 1.09) -- patient states that her effort was limited due to knee pain.  This makes the rest of the interpretation difficult: Peak VO2 - 10.5 (59% moderate), peak 02-pulse -  7..11 (low); heart rate 114 (73% -chronic incompetence considered);; PFTs normal    MOUTH SURGERY     NM MYOVIEW LTD  05/19/2013   LexiScan: EF 80%, no evidence of ischemia or infarction   TRANSTHORACIC ECHOCARDIOGRAM  03/25/2013   EF 55-60%, no regional wall motion abnormalities; normal diastolic parameters, normal pulmonary pressures.  No valvular lesions noted.  Essentially normal echo    Patient Active Problem List   Diagnosis Date Noted   Acute pulmonary embolism (HCC) 01/13/2018   Chest pain 01/11/2018   Elevated troponin 01/11/2018   AKI (acute kidney injury) (HCC)  01/11/2018   NSTEMI (non-ST elevated myocardial infarction) (HCC) 01/11/2018   Vascular headache    Seizure prophylaxis    Weakness of left third cranial nerve    Gait disturbance, post-stroke 11/22/2016   Benign essential HTN    Seizure disorder (HCC)    Acute blood loss anemia    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   Bilateral leg pain 06/26/2016   AAA (abdominal aortic aneurysm) without rupture (HCC) 06/26/2016   Insomnia 06/07/2015   Urinary incontinence 06/07/2015   Chronic pain syndrome 03/04/2015   Fibromyalgia 03/04/2015   Cervical radiculitis 03/04/2015   Lumbar radicular pain 03/04/2015   Major depression 07/05/2014   Claudication, class I (HCC) 03/18/2013    Smoker unmotivated to quit     ONSET DATE: 01/21/2023 (referral)   REFERRING DIAG: M54.50,G89.29 (ICD-10-CM) - Chronic midline low back pain without sciatica I69.30 (ICD-10-CM) - History of CVA with residual deficit  THERAPY DIAG:  Muscle weakness (generalized)  Unsteadiness on feet  Repeated falls  Chronic pain of both knees  Other low back pain  Rationale for Evaluation and Treatment: Rehabilitation  SUBJECTIVE:                                                                                                                                                                                             SUBJECTIVE STATEMENT: Pt goes by "Jennifer Jacobs"   Pt reports she has pain in her BLEs, not her back. Back only hurts when she bends forward, but her legs hurt when she walks. States she needs to be checking her blood sugar, has not been doing so. Had a fall last month while walking, fell to the side and needed help getting up. "I have had plenty of falls".   Pt accompanied by: self  PERTINENT HISTORY: Pt has had 3 strokes, last one was about 3-4 years ago (per pt). Residual cognitive deficits.   PAIN:  Are you having pain? Yes: NPRS scale: 8/10 Pain location: BLEs Pain description: achy Aggravating factors: Moving around/walking  Relieving factors: Pain meds  PRECAUTIONS: Fall  WEIGHT BEARING RESTRICTIONS: No  FALLS: Has patient fallen in last 6 months? Yes. Number of falls "plenty"   LIVING ENVIRONMENT: Lives with: lives alone Lives in: House/apartment Stairs:  Has an Engineer, structural  Has following equipment at home: Counselling psychologist, Environmental consultant - 4 wheeled, Tour manager, and Grab bars  PLOF: Requires assistive device for independence, Needs assistance with ADLs, and has care aide 5 days per week from 11-2   PATIENT GOALS: "being able to run again"   OBJECTIVE:   DIAGNOSTIC FINDINGS: MRI on 04/30/2017  IMPRESSION: 1. Satisfactory MRA appearance of the left ICA terminus and the  left MCA status post surgical clipping of left Pcomm and left MCA bifurcation region aneurysms. Susceptibility artifact related loss of flow signal. 2. The right anterior circulation and the posterior circulation remain normal.  COGNITION: Overall cognitive status: History of cognitive impairments - at baseline and Difficulty to assess due to: no family present   SENSATION: Pt reports numbness in bilateral feet and hands, gets cramps often   COORDINATION: Heel to shin test: unable to fully perform due to BLE weakness   EDEMA: Chronic bilateral swelling of distal LEs    POSTURE: rounded shoulders, forward head, and weight shift left    LOWER EXTREMITY MMT:  Tested in seated position   MMT Right Eval Left Eval  Hip flexion 3+ 3+  Hip extension    Hip abduction 5 5  Hip adduction 5 5  Hip internal rotation    Hip external rotation    Knee flexion 5 3+  Knee extension 5 5  Ankle dorsiflexion 5 5  Ankle plantarflexion    Ankle inversion    Ankle eversion    (Blank rows = not tested)  BED MOBILITY:  Independent per pt  TRANSFERS: Assistive device utilized: Environmental consultant - 4 wheeled  Sit to stand: SBA Stand to sit: SBA Heavy BUE support    GAIT: Gait pattern: step through pattern, decreased hip/knee flexion- Left, trunk flexed, and poor foot clearance- Left Distance walked: Various clinic distances  Assistive device utilized: Environmental consultant - 4 wheeled Level of assistance: SBA Comments: Pt ambulates to L side of rollator, narrowly missing L wheel w/L foot   FUNCTIONAL TESTS:   [facility] PT Assessment - 01/29/23 1446       Transfers   Five time sit to stand comments  35.28s   w/BUE support     Ambulation/Gait   Gait velocity 32.8' over 12.91s = 2.54 ft/s w/rollator              VITALS Vitals:   01/29/23 1435  BP: 104/77  Pulse: (!) 54     TODAY'S TREATMENT:     Next Session                                                                                                                               PATIENT EDUCATION: Education details: POC, eval findings, introduction on aquatic therapy and pt to ask if care aide can be present to assist w/changing after pool Person educated: Patient Education method: Explanation, Demonstration, and Handouts Education comprehension: verbalized understanding and needs further education  HOME EXERCISE PROGRAM: To be established   GOALS: Goals reviewed with patient? Yes  SHORT TERM GOALS: Target date: 02/26/2023  Pt will be independent with initial HEP for improved strength, balance, transfers and gait. Baseline: not established on eval  Goal status: INITIAL  2.  Pt will improve 5 x STS to less than or equal to 30  seconds w/BUE support to demonstrate improved functional strength and transfer efficiency.   Baseline: 35.28s w/BUE support  Goal status: INITIAL  3.  Pt will improve gait velocity to at least 2.8 ft/s w/LRAD for improved gait efficiency and independence  Baseline: 2.54 ft/s with rollator Goal status: INITIAL  4.  to be assessed and STG/LTG updated  Baseline:  Goal status: INITIAL  5.  Berg to be assessed and STG/LTG written Baseline:  Goal status: INITIAL    LONG TERM GOALS: Target date: 03/12/2023    Pt will be independent with final HEP for improved strength, balance, transfers and gait.  Baseline:  Goal status: INITIAL  2.  goal Baseline:  Goal status: INITIAL  3.  Pt will improve gait velocity to at least 3.1 ft/s w/LRAD for improved gait efficiency   Baseline: 2.54 ft/s w/rollator Goal status: INITIAL  4.  Pt will improve 5 x STS to less than or equal to 25 seconds w/BUE support to demonstrate improved functional strength and transfer efficiency.   Baseline: 35.28s w/BUE support Goal status: INITIAL  5.  Berg goal Baseline:  Goal status: INITIAL    ASSESSMENT:  CLINICAL IMPRESSION: Patient is a 77 year old female referred to Neuro [facility] for low  back pain and history of CVA. Pt's PMH is significant for: CAD, NSTEMI, PE, stroke. The following deficits were present during the exam: decreased BLE strength, cognitive deficits, impaired sensation, decreased safety awareness, decreased activity tolerance and impaired balance. Based on falls history, pt is an incr risk for falls. Balance to be further assessed next session. Pt would benefit from skilled PT to address these impairments and functional limitations to maximize functional mobility independence.    OBJECTIVE IMPAIRMENTS: Abnormal gait, decreased activity tolerance, decreased balance, decreased cognition, decreased coordination, decreased endurance, decreased knowledge of use of DME, decreased mobility, difficulty walking, decreased strength, decreased safety awareness, impaired sensation, and pain  ACTIVITY LIMITATIONS: carrying, lifting, squatting, stairs, bathing, dressing, hygiene/grooming, and locomotion level  PARTICIPATION LIMITATIONS: meal prep, cleaning, laundry, driving, shopping, community activity, and yard work  PERSONAL FACTORS: Age, Fitness, Past/current experiences, Transportation, and 1 comorbidity: 3 CVAs  are also affecting patient's functional outcome.   REHAB POTENTIAL: Good  CLINICAL DECISION MAKING: Stable/uncomplicated  EVALUATION COMPLEXITY: Low  PLAN:  PT FREQUENCY: 2x/week (potential to start aquatic therapy at end of June)  PT DURATION: 6 weeks  PLANNED INTERVENTIONS: Therapeutic exercises, Therapeutic activity, Neuromuscular re-education, Balance training, Gait training, Patient/Family education, Self Care, Joint mobilization, Stair training, Orthotic/Fit training, DME instructions, Aquatic Therapy, Electrical stimulation, Manual therapy, and Re-evaluation  PLAN FOR NEXT SESSION: Berg and and update goals. HEP for BLE strength, deficits highlighted by Sharlene Motts (must be able to perform on own or with care aide). Walking program, scifit for endurance     Jill Alexanders Brendalyn Vallely, PT, DPT 01/29/2023, 3:07 PM

## 2023-02-01 ENCOUNTER — Other Ambulatory Visit: Payer: Self-pay

## 2023-02-01 ENCOUNTER — Emergency Department (HOSPITAL_COMMUNITY): Payer: 59

## 2023-02-01 ENCOUNTER — Emergency Department (HOSPITAL_COMMUNITY)
Admission: EM | Admit: 2023-02-01 | Discharge: 2023-02-01 | Disposition: A | Discharge status: Home / Self Care | Payer: 59 | Attending: Emergency Medicine | Admitting: Emergency Medicine

## 2023-02-01 ENCOUNTER — Encounter (HOSPITAL_COMMUNITY): Payer: Self-pay | Admitting: Emergency Medicine

## 2023-02-01 DIAGNOSIS — I10 Essential (primary) hypertension: Secondary | ICD-10-CM | POA: Insufficient documentation

## 2023-02-01 DIAGNOSIS — Z79899 Other long term (current) drug therapy: Secondary | ICD-10-CM | POA: Insufficient documentation

## 2023-02-01 DIAGNOSIS — E861 Hypovolemia: Secondary | ICD-10-CM | POA: Insufficient documentation

## 2023-02-01 DIAGNOSIS — I9589 Other hypotension: Secondary | ICD-10-CM | POA: Diagnosis not present

## 2023-02-01 DIAGNOSIS — Z7901 Long term (current) use of anticoagulants: Secondary | ICD-10-CM | POA: Diagnosis not present

## 2023-02-01 DIAGNOSIS — E860 Dehydration: Secondary | ICD-10-CM

## 2023-02-01 DIAGNOSIS — R55 Syncope and collapse: Secondary | ICD-10-CM | POA: Diagnosis present

## 2023-02-01 LAB — COMPREHENSIVE METABOLIC PANEL
ALT: 15 U/L (ref 0–44)
AST: 16 U/L (ref 15–41)
Albumin: 3.2 g/dL — ABNORMAL LOW (ref 3.5–5.0)
Alkaline Phosphatase: 52 U/L (ref 38–126)
Anion gap: 7 (ref 5–15)
BUN: 29 mg/dL — ABNORMAL HIGH (ref 8–23)
CO2: 24 mmol/L (ref 22–32)
Calcium: 9.1 mg/dL (ref 8.9–10.3)
Chloride: 107 mmol/L (ref 98–111)
Creatinine, Ser: 1.44 mg/dL — ABNORMAL HIGH (ref 0.44–1.00)
GFR, Estimated: 38 mL/min — ABNORMAL LOW (ref >60)
Glucose, Bld: 82 mg/dL (ref 70–99)
Potassium: 3.6 mmol/L (ref 3.5–5.1)
Sodium: 138 mmol/L (ref 135–145)
Total Bilirubin: 0.5 mg/dL (ref 0.3–1.2)
Total Protein: 6.6 g/dL (ref 6.5–8.1)

## 2023-02-01 LAB — URINALYSIS, ROUTINE W REFLEX MICROSCOPIC
Bilirubin Urine: NEGATIVE
Glucose, UA: NEGATIVE mg/dL
Hgb urine dipstick: NEGATIVE
Ketones, ur: NEGATIVE mg/dL
Leukocytes,Ua: NEGATIVE
Nitrite: NEGATIVE
Protein, ur: NEGATIVE mg/dL
Specific Gravity, Urine: 1.012 (ref 1.005–1.030)
pH: 5 (ref 5.0–8.0)

## 2023-02-01 LAB — CBC WITH DIFFERENTIAL/PLATELET
Abs Immature Granulocytes: 0.03 10*3/uL (ref 0.00–0.07)
Basophils Absolute: 0 10*3/uL (ref 0.0–0.1)
Basophils Relative: 1 %
Eosinophils Absolute: 0.2 10*3/uL (ref 0.0–0.5)
Eosinophils Relative: 2 %
HCT: 38.4 % (ref 36.0–46.0)
Hemoglobin: 12.4 g/dL (ref 12.0–15.0)
Immature Granulocytes: 0 %
Lymphocytes Relative: 39 %
Lymphs Abs: 2.8 10*3/uL (ref 0.7–4.0)
MCH: 31.7 pg (ref 26.0–34.0)
MCHC: 32.3 g/dL (ref 30.0–36.0)
MCV: 98.2 fL (ref 80.0–100.0)
Monocytes Absolute: 0.9 10*3/uL (ref 0.1–1.0)
Monocytes Relative: 13 %
Neutro Abs: 3.3 10*3/uL (ref 1.7–7.7)
Neutrophils Relative %: 45 %
Platelets: 220 10*3/uL (ref 150–400)
RBC: 3.91 MIL/uL (ref 3.87–5.11)
RDW: 15 % (ref 11.5–15.5)
WBC: 7.3 10*3/uL (ref 4.0–10.5)
nRBC: 0 % (ref 0.0–0.2)

## 2023-02-01 LAB — POC OCCULT BLOOD, ED: Fecal Occult Bld: NEGATIVE

## 2023-02-01 LAB — TROPONIN I (HIGH SENSITIVITY)
Troponin I (High Sensitivity): 4 ng/L (ref <18)
Troponin I (High Sensitivity): 6 ng/L (ref <18)

## 2023-02-01 LAB — LACTIC ACID, PLASMA: Lactic Acid, Venous: 1.2 mmol/L (ref 0.5–1.9)

## 2023-02-01 LAB — ETHANOL: Alcohol, Ethyl (B): <10 mg/dL (ref <10)

## 2023-02-01 MED ORDER — SODIUM CHLORIDE 0.9 % IV BOLUS
2000.0000 mL | Freq: Once | INTRAVENOUS | Status: AC
  Administered 2023-02-01: 2000 mL via INTRAVENOUS

## 2023-02-01 NOTE — Discharge Instructions (Signed)
Drink plenty of fluids.  Stop taking your blood pressure medicines until you see your doctor next week.

## 2023-02-01 NOTE — ED Provider Notes (Signed)
[place] [facility] AT [facility] Provider Note   CSN: [id number] Arrival date & time: 02/01/23  1549     History {Add pertinent medical, surgical, social history, OB history to HPI:1} Chief Complaint  Patient presents with   Loss of Consciousness   Hypotension    Jennifer Jacobs is a 77 y.o. female.  Pt has a history of hypertension and has had an MI before.  She was very weak today and hypotensive.   Loss of Consciousness      Home Medications Prior to Admission medications   Medication Sig Start Date End Date Taking? Authorizing Provider  albuterol (VENTOLIN HFA) 108 (90 Base) MCG/ACT inhaler Inhale into the lungs every 6 (six) hours as needed for wheezing or shortness of breath.    [provider]  apixaban (ELIQUIS) 5 MG TABS tablet Take 5 mg by mouth daily.    [provider]  BESIVANCE 0.6 % SUSP Place 1 drop into the left eye 3 (three) times daily.  06/11/18   [provider]  DUREZOL 0.05 % EMUL Place 1 drop into the left eye 3 (three) times daily.  06/11/18   [provider]  escitalopram (LEXAPRO) 20 MG tablet Take 1 tablet (20 mg total) by mouth 2 (two) times daily. 03/18/18   Myrlene Broker, MD  gabapentin (NEURONTIN) 800 MG tablet Take 800 mg by mouth 3 (three) times daily.  11/05/17   [provider]  lamoTRIgine (LAMICTAL) 100 MG tablet Take 1 tablet (100 mg total) by mouth 2 (two) times daily. 03/18/18 12/18/19  Myrlene Broker, MD  methocarbamol (ROBAXIN) 500 MG tablet Take 1 tablet (500 mg total) by mouth 2 (two) times daily as needed for muscle spasms. 02/26/22   Mesner, Barbara Cower, MD  metoprolol tartrate (LOPRESSOR) 25 MG tablet Take 1 tablet (25 mg total) by mouth 2 (two) times daily. 12/18/19 03/17/20  Antoine Poche, MD  olmesartan (BENICAR) 20 MG tablet Take 1 tablet (20 mg total) by mouth daily. 04/04/18   Antoine Poche, MD  oxybutynin (DITROPAN-XL) 10 MG 24 hr tablet Take 1 tablet (10  mg total) by mouth at bedtime. 12/10/17   Eustace Moore, MD  PROLENSA 0.07 % SOLN Place 1 drop into the left eye daily.  06/11/18   [provider]  risperiDONE (RISPERDAL) 0.5 MG tablet Take 1 tablet (0.5 mg total) by mouth at bedtime. 03/18/18   Myrlene Broker, MD  traMADol (ULTRAM) 50 MG tablet Take by mouth every 6 (six) hours as needed.    [provider]  traZODone (DESYREL) 50 MG tablet Take 1 tablet (50 mg total) by mouth at bedtime. 03/18/18   Myrlene Broker, MD      Allergies    Aspirin and Prozac [fluoxetine hcl]    Review of Systems   Review of Systems  Cardiovascular:  Positive for syncope.    Physical Exam Updated Vital Signs BP 128/77 (BP Location: Left Arm)   Pulse 61   Temp 98.7 F (37.1 C) (Oral)   Resp 16   SpO2 95%  Physical Exam  ED Results / Procedures / Treatments   Labs (all labs ordered are listed, but only abnormal results are displayed) Labs Reviewed  COMPREHENSIVE METABOLIC PANEL - Abnormal; Notable for the following components:      Result Value   BUN 29 (*)    Creatinine, Ser 1.44 (*)    Albumin 3.2 (*)    GFR, Estimated 38 (*)  All other components within normal limits  LACTIC ACID, PLASMA  URINALYSIS, ROUTINE W REFLEX MICROSCOPIC  ETHANOL  CBC WITH DIFFERENTIAL/PLATELET  LACTIC ACID, PLASMA  POC OCCULT BLOOD, ED  TROPONIN I (HIGH SENSITIVITY)  TROPONIN I (HIGH SENSITIVITY)    EKG None  Radiology DG Chest Port 1 View  Result Date: 02/01/2023 CLINICAL DATA:  Shortness of breath.  Dry cough EXAM: PORTABLE CHEST 1 VIEW COMPARISON:  X-ray 02/25/2022 and CT scan FINDINGS: Enlarged cardiopericardial silhouette with tortuous ectatic aorta. No pneumothorax or effusion. There is some linear opacity at the bases. Atelectasis is favored or scar. No separate consolidation. Overlapping cardiac leads. IMPRESSION: Enlarged heart. Tortuous and ectatic aorta. Basilar atelectasis or scar Electronically Signed   By: Karen Kays  M.D.   On: 02/01/2023 16:59    Procedures Procedures  {Document cardiac monitor, telemetry assessment procedure when appropriate:1}  Medications Ordered in ED Medications  sodium chloride 0.9 % bolus 2,000 mL (0 mLs Intravenous Stopped 02/01/23 1902)    ED Course/ Medical Decision Making/ A&P   {   Click here for ABCD2, HEART and other calculatorsREFRESH Note before signing :1}                          Medical Decision Making Amount and/or Complexity of Data Reviewed Labs: ordered. Radiology: ordered. ECG/medicine tests: ordered.   Patient with dehydration and hypotension.  She responded to normal saline.  She was told to stop her blood pressure medicine for now and follow-up with PCP next week  {Document critical care time when appropriate:1} {Document review of labs and clinical decision tools ie heart score, Chads2Vasc2 etc:1}  {Document your independent review of radiology images, and any outside records:1} {Document your discussion with family members, caretakers, and with consultants:1} {Document social determinants of health affecting pt's care:1} {Document your decision making why or why not admission, treatments were needed:1} Final Clinical Impression(s) / ED Diagnoses Final diagnoses:  Dehydration  Hypotension due to hypovolemia    Rx / DC Orders ED Discharge Orders     None

## 2023-02-01 NOTE — ED Triage Notes (Signed)
Pt BIB EMS from home, c/o sycope episode. Per caregiver, pt was attempted to exit car, felt dizzy and fainted. Caregiver caught her and lowered to ground. Denies injury or pain. Pt endorsed active weight loss attempt, has not eaten anything other than milkshake. Initial BP 70/50, after 500 cc bolus, BP improved  BP 116/44 P 59 spO2 100% RA  CBG 107 20 LFA

## 2023-02-08 ENCOUNTER — Ambulatory Visit: Payer: 59 | Admitting: Physical Therapy

## 2023-02-08 VITALS — BP 105/68 | HR 72

## 2023-02-08 DIAGNOSIS — M6281 Muscle weakness (generalized): Secondary | ICD-10-CM | POA: Diagnosis not present

## 2023-02-08 DIAGNOSIS — R2681 Unsteadiness on feet: Secondary | ICD-10-CM

## 2023-02-08 DIAGNOSIS — G8929 Other chronic pain: Secondary | ICD-10-CM

## 2023-02-08 NOTE — Therapy (Signed)
OUTPATIENT PHYSICAL THERAPY NEURO TREATMENT   Patient Name: Jennifer Jacobs MRN: [id number] DOB:13-Nov-1945, 77 y.o., female Today's Date: 02/08/2023   PCP: Loura Back, NP REFERRING PROVIDER: Madelyn Brunner, DO  END OF SESSION:  PT End of Session - 02/08/23 1449     Visit Number 2    Number of Visits 13   Plus eval   Date for PT Re-Evaluation 03/12/23    Authorization Type [employer]    PT Start Time 1447    PT Stop Time 1531    PT Time Calculation (min) 44 min    Equipment Utilized During Treatment Gait belt    Activity Tolerance Patient tolerated treatment well    Behavior During Therapy WFL for tasks assessed/performed              Past Medical History:  Diagnosis Date   Acute blood loss anemia    AKI (acute kidney injury) (HCC) 01/11/2018   Alcohol abuse 07/05/2014   Allergy    Anxiety    Anxiety and depression    Arthritis    Cognitive deficit due to recent stroke 11/22/2016   COPD (chronic obstructive pulmonary disease) (HCC)    Depression    Dyslipidemia    Fibromyalgia    Hepatic cyst    Hyperlipidemia    Hypertension    Leukocytosis    Liver cyst 03/18/2014   01/26/2014 ultrasound Hypoechoic structure adjacent to gallbladder right lobe liver,  measuring 29 x 18 x 17 mm, possibly septated cyst     MI (myocardial infarction) (HCC) 2007   By patient report, not substantiated by recent nuclear stress test.   Neuromuscular disorder (HCC)    Seizure disorder (HCC)    none since 2002   Seizure prophylaxis    Seizures (HCC)    Smoker unmotivated to quit    Quit for 3 years and then restarted   Substance abuse (HCC)    ETOH   Vision abnormalities    Past Surgical History:  Procedure Laterality Date   CATARACT EXTRACTION W/PHACO Right 06/30/2018   Procedure: CATARACT EXTRACTION PHACO AND INTRAOCULAR LENS PLACEMENT RIGHT EYE;  Surgeon: Gemma Payor, MD;  Location: [facility];  Service: Ophthalmology;  Laterality: Right;  CDE: 9.90   CATARACT EXTRACTION W/PHACO  Left 07/14/2018   Procedure: CATARACT EXTRACTION PHACO AND INTRAOCULAR LENS PLACEMENT (IOC);  Surgeon: Gemma Payor, MD;  Location: [facility];  Service: Ophthalmology;  Laterality: Left;  CDE: 6.96   CRANIOTOMY Left 11/19/2016   Procedure: CRANIOTOMY INTRACRANIAL FOR  ANEURYSM CLIPPING;  Surgeon: Loura Halt Ditty, MD;  Location: [facility] OR;  Service: Neurosurgery;  Laterality: Left;   ELBOW ARTHROPLASTY Left    rods   Exercise tolerance test  04/27/2013   CPET-MET: Submaximal effort (0.96, with goal of greater than 1.09) -- patient states that her effort was limited due to knee pain.  This makes the rest of the interpretation difficult: Peak VO2 - 10.5 (59% moderate), peak 02-pulse -  7..11 (low); heart rate 114 (73% -chronic incompetence considered);; PFTs normal    MOUTH SURGERY     NM MYOVIEW LTD  05/19/2013   LexiScan: EF 80%, no evidence of ischemia or infarction   TRANSTHORACIC ECHOCARDIOGRAM  03/25/2013   EF 55-60%, no regional wall motion abnormalities; normal diastolic parameters, normal pulmonary pressures.  No valvular lesions noted.  Essentially normal echo    Patient Active Problem List   Diagnosis Date Noted   Acute pulmonary embolism (HCC) 01/13/2018   Chest pain 01/11/2018  Elevated troponin 01/11/2018   AKI (acute kidney injury) (HCC) 01/11/2018   NSTEMI (non-ST elevated myocardial infarction) (HCC) 01/11/2018   Vascular headache    Seizure prophylaxis    Weakness of left third cranial nerve    Gait disturbance, post-stroke 11/22/2016   Benign essential HTN    Seizure disorder (HCC)    Acute blood loss anemia    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   Bilateral leg pain 06/26/2016   AAA (abdominal aortic aneurysm) without rupture (HCC) 06/26/2016   Insomnia 06/07/2015   Urinary incontinence 06/07/2015   Chronic pain syndrome 03/04/2015   Fibromyalgia 03/04/2015   Cervical radiculitis 03/04/2015   Lumbar radicular pain 03/04/2015   Major depression  07/05/2014   Claudication, class I (HCC) 03/18/2013   Smoker unmotivated to quit     ONSET DATE: 01/21/2023 (referral)   REFERRING DIAG: M54.50,G89.29 (ICD-10-CM) - Chronic midline low back pain without sciatica I69.30 (ICD-10-CM) - History of CVA with residual deficit  THERAPY DIAG:  Muscle weakness (generalized)  Unsteadiness on feet  Chronic pain of both knees  Rationale for Evaluation and Treatment: Rehabilitation  SUBJECTIVE:                                                                                                                                                                                             SUBJECTIVE STATEMENT: Pt goes by "Jennifer Jacobs"   Pt reports she "is not in the mood to walk". States her aide is unable to come with her to the pool, will ask her son. Having pain in her knees today.   Pt accompanied by: self  PERTINENT HISTORY: Pt has had 3 strokes, last one was about 3-4 years ago (per pt). Residual cognitive deficits.   PAIN:  Are you having pain? Yes: NPRS scale: 8/10 Pain location: BLEs Pain description: achy Aggravating factors: Moving around/walking  Relieving factors: Pain meds  PRECAUTIONS: Fall  WEIGHT BEARING RESTRICTIONS: No  FALLS: Has patient fallen in last 6 months? Yes. Number of falls "plenty"   LIVING ENVIRONMENT: Lives with: lives alone Lives in: House/apartment Stairs:  Has an Engineer, structural  Has following equipment at home: Counselling psychologist, Environmental consultant - 4 wheeled, Tour manager, and Grab bars  PLOF: Requires assistive device for independence, Needs assistance with ADLs, and has care aide 5 days per week from 11-2   PATIENT GOALS: "being able to run again"   OBJECTIVE:   DIAGNOSTIC FINDINGS: MRI on 04/30/2017   IMPRESSION: 1. Satisfactory MRA appearance of the left ICA terminus and the left MCA status post surgical clipping of left Pcomm and left MCA bifurcation region  aneurysms. Susceptibility artifact related loss of flow  signal. 2. The right anterior circulation and the posterior circulation remain normal.  COGNITION: Overall cognitive status: History of cognitive impairments - at baseline and Difficulty to assess due to: no family present   SENSATION: Pt reports numbness in bilateral feet and hands, gets cramps often   COORDINATION: Heel to shin test: unable to fully perform due to BLE weakness   EDEMA: Chronic bilateral swelling of distal LEs    POSTURE: rounded shoulders, forward head, and weight shift left    LOWER EXTREMITY MMT:  Tested in seated position   MMT Right Eval Left Eval  Hip flexion 3+ 3+  Hip extension    Hip abduction 5 5  Hip adduction 5 5  Hip internal rotation    Hip external rotation    Knee flexion 5 3+  Knee extension 5 5  Ankle dorsiflexion 5 5  Ankle plantarflexion    Ankle inversion    Ankle eversion    (Blank rows = not tested) VITALS Vitals:   02/08/23 1453  BP: 105/68  Pulse: 72     TODAY'S TREATMENT:     Ther Act   [facility] PT Assessment - 02/08/23 1454       Balance   Balance Assessed Yes      Standardized Balance Assessment   Standardized Balance Assessment Berg Balance Test      Berg Balance Test   Sit to Stand Able to stand without using hands and stabilize independently    Standing Unsupported Able to stand safely 2 minutes   RPE of 7-8/10   Sitting with Back Unsupported but Feet Supported on Floor or Stool Able to sit safely and securely 2 minutes    Stand to Sit Controls descent by using hands    Transfers Able to transfer safely, minor use of hands    Standing Unsupported with Eyes Closed Able to stand 10 seconds with supervision    Standing Unsupported with Feet Together Able to place feet together independently and stand for 1 minute with supervision    From Standing, Reach Forward with Outstretched Arm Can reach forward >5 cm safely (2")    From Standing Position, Pick up Object from Floor Able to pick up shoe safely and easily     From Standing Position, Turn to Look Behind Over each Shoulder Turn sideways only but maintains balance    Turn 360 Degrees Able to turn 360 degrees safely but slowly   11.25s to L   Standing Unsupported, Alternately Place Feet on Step/Stool Able to complete >2 steps/needs minimal assist   HHA on RUE   Standing Unsupported, One Foot in Front Able to take small step independently and hold 30 seconds    Standing on One Leg Tries to lift leg/unable to hold 3 seconds but remains standing independently    Total Score 39    Berg comment: Significant fall risk             Called and spoke to pt's care aide, Despina Arias, regarding necessity for aide to accompany pt to aquatic therapy appointment next week. Care aide verbalized agreement and understanding and stated she would be present. Provided pt w/aquatic therapy handout and new printed appointment calendar with all aquatic appointments highlighted for [facility].    Ther Ex  Established initial HEP (see bolded below) for BLE strength and endurance:  - Sit to Stand Without Arm Support, x6 reps. Pt performed well but required encouragement to perform without  rest breaks due to fatigue and knee pain.  - Standing March with Counter Support, [phone]. Pt reported "wobbly" knees with this, but no instability noted.      GAIT: Gait pattern: step through pattern, decreased hip/knee flexion- Left, trunk flexed, and poor foot clearance- Left Distance walked: Various clinic distances  Assistive device utilized: Walker - 4 wheeled and None Level of assistance: SBA Comments: Noted improved AD management this date. Pt demonstrates improved posture and step length/clearance without AD     PATIENT EDUCATION: Education details: Initial HEP, updated appointment calendar w/aquatic appointments highlighted for [place] Person educated: Patient and Surveyor, mining Education method: Explanation, Demonstration, and Handouts Education comprehension: verbalized  understanding, returned demonstration, and needs further education  HOME EXERCISE PROGRAM: Access Code: [email or web address] URL: [email or web address] Date: 02/08/2023 Prepared by: Alethia Berthold Julia Kulzer  Exercises - Sit to Stand Without Arm Support  - 1 x daily - 7 x weekly - 3 sets - 5-7 reps - Standing March with Counter Support  - 1 x daily - 7 x weekly - 3 sets - 30-45 second hold  GOALS: Goals reviewed with patient? Yes  SHORT TERM GOALS: Target date: 02/26/2023  Pt will be independent with initial HEP for improved strength, balance, transfers and gait. Baseline: not established on eval  Goal status: INITIAL  2.  Pt will improve 5 x STS to less than or equal to 30 seconds w/BUE support to demonstrate improved functional strength and transfer efficiency.   Baseline: 35.28s w/BUE support  Goal status: INITIAL  3.  Pt will improve gait velocity to at least 2.8 ft/s w/LRAD for improved gait efficiency and independence  Baseline: 2.54 ft/s with rollator Goal status: INITIAL  4.  to be assessed and STG/LTG updated  Baseline:  Goal status: INITIAL  5. Pt will improve Berg score to 44/56 for decreased fall risk  Baseline: 39/56 Goal status: REVISED    LONG TERM GOALS: Target date: 03/12/2023    Pt will be independent with final HEP for improved strength, balance, transfers and gait.  Baseline:  Goal status: INITIAL  2.  goal Baseline:  Goal status: INITIAL  3.  Pt will improve gait velocity to at least 3.1 ft/s w/LRAD for improved gait efficiency   Baseline: 2.54 ft/s w/rollator Goal status: INITIAL  4.  Pt will improve 5 x STS to less than or equal to 25 seconds w/BUE support to demonstrate improved functional strength and transfer efficiency.   Baseline: 35.28s w/BUE support Goal status: INITIAL  5.  Pt will improve Berg score to 49/56 for decreased fall risk  Baseline: 39/56 Goal status: REVISED    ASSESSMENT:  CLINICAL  IMPRESSION: Emphasis of skilled PT session on balance assessment, establishment of initial HEP and caregiver education regarding aquatic therapy. Pt scored a 39/56 on Berg, indicative of significant fall risk. Pt most challenged by single leg stability, turns and reaching out of BOS. Pt demonstrates improved gait kinematics without use of AD and stated she "needs to be pushed", as she is capable of higher level activity than she currently performs. Called and spoke to pt's caregiver, Despina Arias, regarding importance of assisting pt to aquatic therapy on Thursdays and sent home printed appointment calendar w/aquatic appointments highlighted w/pt for [facility]. Sharena verbalized to therapist that she will be present for appointments. Continue POC.    OBJECTIVE IMPAIRMENTS: Abnormal gait, decreased activity tolerance, decreased balance, decreased cognition, decreased coordination, decreased endurance, decreased knowledge of use of DME, decreased mobility, difficulty walking, decreased strength, decreased  safety awareness, impaired sensation, and pain  ACTIVITY LIMITATIONS: carrying, lifting, squatting, stairs, bathing, dressing, hygiene/grooming, and locomotion level  PARTICIPATION LIMITATIONS: meal prep, cleaning, laundry, driving, shopping, community activity, and yard work  PERSONAL FACTORS: Age, Fitness, Past/current experiences, Transportation, and 1 comorbidity: 3 CVAs  are also affecting patient's functional outcome.   REHAB POTENTIAL: Good  CLINICAL DECISION MAKING: Stable/uncomplicated  EVALUATION COMPLEXITY: Low  PLAN:  PT FREQUENCY: 2x/week (potential to start aquatic therapy at end of June)  PT DURATION: 6 weeks  PLANNED INTERVENTIONS: Therapeutic exercises, Therapeutic activity, Neuromuscular re-education, Balance training, Gait training, Patient/Family education, Self Care, Joint mobilization, Stair training, Orthotic/Fit training, DME instructions, Aquatic Therapy, Electrical  stimulation, Manual therapy, and Re-evaluation  PLAN FOR NEXT SESSION: LAND: and update goals. Add to HEP for BLE strength, deficits highlighted by Sharlene Motts. Walking program, scifit for endurance   POOL: anything for endurance! Pt has bilateral knee pain but wants to be pushed. She is already scheduled out for several weeks   Jill Alexanders Rand [place], PT, DPT 02/08/2023, 3:31 PM

## 2023-02-14 ENCOUNTER — Ambulatory Visit: Payer: 59 | Admitting: Physical Therapy

## 2023-02-14 DIAGNOSIS — R2681 Unsteadiness on feet: Secondary | ICD-10-CM

## 2023-02-14 DIAGNOSIS — G8929 Other chronic pain: Secondary | ICD-10-CM

## 2023-02-14 DIAGNOSIS — R296 Repeated falls: Secondary | ICD-10-CM

## 2023-02-14 DIAGNOSIS — M6281 Muscle weakness (generalized): Secondary | ICD-10-CM | POA: Diagnosis not present

## 2023-02-14 DIAGNOSIS — M5459 Other low back pain: Secondary | ICD-10-CM

## 2023-02-19 ENCOUNTER — Encounter: Payer: Self-pay | Admitting: Physical Therapy

## 2023-02-19 NOTE — Therapy (Signed)
OUTPATIENT PHYSICAL THERAPY NEURO TREATMENT - AQUATIC THERAPY   Patient Name: Jennifer Jacobs MRN: [id number] DOB:1946/03/02, 77 y.o., female Today's Date: 02/19/2023   PCP: Loura Back, NP REFERRING PROVIDER: Madelyn Brunner, DO  END OF SESSION:   02/14/23 1040  PT Visits / Re-Eval  Visit Number 3  Number of Visits 13 (Plus eval)  Date for PT Re-Evaluation 03/12/23  Authorization  Authorization Type [employer]  PT Time Calculation  PT Start Time 1040  PT Stop Time 1136  PT Time Calculation (min) 56 min  PT - End of Session  Equipment Utilized During Treatment Gait belt;Other (comment) (dumbbells, ankle weights, fins, barbell)  Activity Tolerance Patient tolerated treatment well  Behavior During Therapy WFL for tasks assessed/performed     Past Medical History:  Diagnosis Date   Acute blood loss anemia    AKI (acute kidney injury) (HCC) 01/11/2018   Alcohol abuse 07/05/2014   Allergy    Anxiety    Anxiety and depression    Arthritis    Cognitive deficit due to recent stroke 11/22/2016   COPD (chronic obstructive pulmonary disease) (HCC)    Depression    Dyslipidemia    Fibromyalgia    Hepatic cyst    Hyperlipidemia    Hypertension    Leukocytosis    Liver cyst 03/18/2014   01/26/2014 ultrasound Hypoechoic structure adjacent to gallbladder right lobe liver,  measuring 29 x 18 x 17 mm, possibly septated cyst     MI (myocardial infarction) (HCC) 2007   By patient report, not substantiated by recent nuclear stress test.   Neuromuscular disorder (HCC)    Seizure disorder (HCC)    none since 2002   Seizure prophylaxis    Seizures (HCC)    Smoker unmotivated to quit    Quit for 3 years and then restarted   Substance abuse (HCC)    ETOH   Vision abnormalities    Past Surgical History:  Procedure Laterality Date   CATARACT EXTRACTION W/PHACO Right 06/30/2018   Procedure: CATARACT EXTRACTION PHACO AND INTRAOCULAR LENS PLACEMENT RIGHT EYE;  Surgeon: Gemma Payor, MD;   Location: [facility];  Service: Ophthalmology;  Laterality: Right;  CDE: 9.90   CATARACT EXTRACTION W/PHACO Left 07/14/2018   Procedure: CATARACT EXTRACTION PHACO AND INTRAOCULAR LENS PLACEMENT (IOC);  Surgeon: Gemma Payor, MD;  Location: [facility];  Service: Ophthalmology;  Laterality: Left;  CDE: 6.96   CRANIOTOMY Left 11/19/2016   Procedure: CRANIOTOMY INTRACRANIAL FOR  ANEURYSM CLIPPING;  Surgeon: Loura Halt Ditty, MD;  Location: [facility];  Service: Neurosurgery;  Laterality: Left;   ELBOW ARTHROPLASTY Left    rods   Exercise tolerance test  04/27/2013   CPET-MET: Submaximal effort (0.96, with goal of greater than 1.09) -- patient states that her effort was limited due to knee pain.  This makes the rest of the interpretation difficult: Peak VO2 - 10.5 (59% moderate), peak 02-pulse -  7..11 (low); heart rate 114 (73% -chronic incompetence considered);; PFTs normal    MOUTH SURGERY     NM MYOVIEW LTD  05/19/2013   LexiScan: EF 80%, no evidence of ischemia or infarction   TRANSTHORACIC ECHOCARDIOGRAM  03/25/2013   EF 55-60%, no regional wall motion abnormalities; normal diastolic parameters, normal pulmonary pressures.  No valvular lesions noted.  Essentially normal echo    Patient Active Problem List   Diagnosis Date Noted   Acute pulmonary embolism (HCC) 01/13/2018   Chest pain 01/11/2018   Elevated troponin 01/11/2018   AKI (acute kidney injury) (  HCC) 01/11/2018   NSTEMI (non-ST elevated myocardial infarction) (HCC) 01/11/2018   Vascular headache    Seizure prophylaxis    Weakness of left third cranial nerve    Gait disturbance, post-stroke 11/22/2016   Benign essential HTN    Seizure disorder (HCC)    Acute blood loss anemia    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   Bilateral leg pain 06/26/2016   AAA (abdominal aortic aneurysm) without rupture (HCC) 06/26/2016   Insomnia 06/07/2015   Urinary incontinence 06/07/2015   Chronic pain syndrome 03/04/2015   Fibromyalgia  03/04/2015   Cervical radiculitis 03/04/2015   Lumbar radicular pain 03/04/2015   Major depression 07/05/2014   Claudication, class I (HCC) 03/18/2013   Smoker unmotivated to quit     ONSET DATE: 01/21/2023 (referral)   REFERRING DIAG: M54.50,G89.29 (ICD-10-CM) - Chronic midline low back pain without sciatica I69.30 (ICD-10-CM) - History of CVA with residual deficit  THERAPY DIAG:  Muscle weakness (generalized)  Unsteadiness on feet  Chronic pain of both knees  Repeated falls  Other low back pain  Rationale for Evaluation and Treatment: Rehabilitation  SUBJECTIVE:                                                                                                                                                                                             SUBJECTIVE STATEMENT: Pt goes by "Jennifer Jacobs"   Patient presents to [facility] with care aide.  Upon approaching the stairs to enter pool patient tells therapist she experiences vertigo when staring at the water and she is terrified of water.  Patient does self-initiate getting in water despite this without convincing from PT and requires time to acclimate to environment.  Pt accompanied by: self  PERTINENT HISTORY: Pt has had 3 strokes, last one was about 3-4 years ago (per pt). Residual cognitive deficits.   PAIN:  Are you having pain? Yes: NPRS scale: 6/10 Pain location: BLEs Pain description: achy Aggravating factors: Moving around/walking  Relieving factors: Pain meds  PRECAUTIONS: Fall  WEIGHT BEARING RESTRICTIONS: No  FALLS: Has patient fallen in last 6 months? Yes. Number of falls "plenty"   LIVING ENVIRONMENT: Lives with: lives alone Lives in: House/apartment Stairs:  Has an Engineer, structural  Has following equipment at home: Counselling psychologist, Environmental consultant - 4 wheeled, Tour manager, and Grab bars  PLOF: Requires assistive device for independence, Needs assistance with ADLs, and has care aide 5 days per week from 11-2    PATIENT GOALS: "being able to run again"   OBJECTIVE:   DIAGNOSTIC FINDINGS: MRI on 04/30/2017   IMPRESSION: 1. Satisfactory MRA appearance of the  left ICA terminus and the left MCA status post surgical clipping of left Pcomm and left MCA bifurcation region aneurysms. Susceptibility artifact related loss of flow signal. 2. The right anterior circulation and the posterior circulation remain normal.  COGNITION: Overall cognitive status: History of cognitive impairments - at baseline and Difficulty to assess due to: no family present   SENSATION: Pt reports numbness in bilateral feet and hands, gets cramps often   COORDINATION: Heel to shin test: unable to fully perform due to BLE weakness   EDEMA: Chronic bilateral swelling of distal LEs    POSTURE: rounded shoulders, forward head, and weight shift left    LOWER EXTREMITY MMT:  Tested in seated position   MMT Right Eval Left Eval  Hip flexion 3+ 3+  Hip extension    Hip abduction 5 5  Hip adduction 5 5  Hip internal rotation    Hip external rotation    Knee flexion 5 3+  Knee extension 5 5  Ankle dorsiflexion 5 5  Ankle plantarflexion    Ankle inversion    Ankle eversion    (Blank rows = not tested) VITALS There were no vitals filed for this visit.    TODAY'S TREATMENT:     Aquatic therapy at [facility] - pool temperature 92 degrees   Patient seen for aquatic therapy today.  Treatment took place in water 3.6-4.8 feet deep depending upon activity.  Patient entered and exited the pool via stair negotiation at [facility] level w/ increased time to perform and acclimate to pool environment.  Patient reports improved vertigo symptoms w/ visual fixation away from surface of water.  After roughly 2-3 minutes patient reports feeling comfortable in water and is able to begin warmup w/ BUE support.   Exercises: Dynamic warmup - pt uses large aquatic barbell to perform water walking forward, backwards, and laterally x4  rounds each direction.  Standing w/ bilateral dumbbells for balance performing marches  STS using barbell and 5" step under feet for patient to reach floor and balance in standing, CGA; practiced holding seated position without floating cuing patient for core engagement prior to this.  Seated on bench performing LAQ x12 each side  Patient uses barbells to perform lateral walking w/ squats x4 rounds with increased time to perform.  Standing in open water performing barbells push downs, horizontal abduction/adduction, and shoulder abduction and adduction in just under chest height water  Standing w/ wall support performing 3-way hip x10 each direction > added fins x10 each direction  Had patient U-sit on yellow noodle using wall support with BUE and posterior support from PT progressing to light posterior support only over several minutes for core engagement and static sitting balance.  Patient requires buoyancy of the water for support for reduced fall risk with gait training and balance exercises with CGA support. Exercises able to be performed safely in water without the risk of fall compared to those same exercises performed on land; viscosity of water needed for resistance for strengthening. Current of water provides perturbations for challenging static and dynamic balance.   PATIENT EDUCATION: Education details: Discussed caregiver being able to get in pool with patient and PT at next visit to learn necessary handling and safety as well as exercise routine.  If still planning to go to [facility] at next visit PT will begin putting together aquatic based HEP. Person educated: Patient and Surveyor, mining Education method: Explanation, Demonstration, and Handouts Education comprehension: verbalized understanding, returned demonstration, and needs further education  HOME  EXERCISE PROGRAM: Access Code: [email or web address] URL: [email or web address] Date: 02/08/2023 Prepared by: Alethia Berthold  Plaster  Exercises - Sit to Stand Without Arm Support  - 1 x daily - 7 x weekly - 3 sets - 5-7 reps - Standing March with Counter Support  - 1 x daily - 7 x weekly - 3 sets - 30-45 second hold  GOALS: Goals reviewed with patient? Yes  SHORT TERM GOALS: Target date: 02/26/2023  Pt will be independent with initial HEP for improved strength, balance, transfers and gait. Baseline: not established on eval  Goal status: INITIAL  2.  Pt will improve 5 x STS to less than or equal to 30 seconds w/BUE support to demonstrate improved functional strength and transfer efficiency.   Baseline: 35.28s w/BUE support  Goal status: INITIAL  3.  Pt will improve gait velocity to at least 2.8 ft/s w/LRAD for improved gait efficiency and independence  Baseline: 2.54 ft/s with rollator Goal status: INITIAL  4.  to be assessed and STG/LTG updated  Baseline:  Goal status: INITIAL  5. Pt will improve Berg score to 44/56 for decreased fall risk  Baseline: 39/56 Goal status: REVISED    LONG TERM GOALS: Target date: 03/12/2023    Pt will be independent with final HEP for improved strength, balance, transfers and gait.  Baseline:  Goal status: INITIAL  2.  goal Baseline:  Goal status: INITIAL  3.  Pt will improve gait velocity to at least 3.1 ft/s w/LRAD for improved gait efficiency   Baseline: 2.54 ft/s w/rollator Goal status: INITIAL  4.  Pt will improve 5 x STS to less than or equal to 25 seconds w/BUE support to demonstrate improved functional strength and transfer efficiency.   Baseline: 35.28s w/BUE support Goal status: INITIAL  5.  Pt will improve Berg score to 49/56 for decreased fall risk  Baseline: 39/56 Goal status: REVISED    ASSESSMENT:  CLINICAL IMPRESSION: Aquatic therapy initiated this visit with patient initially hesitant about activity, but acclimated well with time in water.  Her knee pain was well managed during all activities performed in pool.  She  is interested in going to local [employer] to continue pool based exercise with care aide.  If still planning to do this and aide comes to aquatic again for caregiver training, as pt cannot swim and will need someone in the pool for her to continue outside visits, then PT will establish an aquatic HEP.  She continues to benefit from skilled PT to progress overall activity tolerance, strength, dynamic stability, and ambulatory mechanics.  OBJECTIVE IMPAIRMENTS: Abnormal gait, decreased activity tolerance, decreased balance, decreased cognition, decreased coordination, decreased endurance, decreased knowledge of use of DME, decreased mobility, difficulty walking, decreased strength, decreased safety awareness, impaired sensation, and pain  ACTIVITY LIMITATIONS: carrying, lifting, squatting, stairs, bathing, dressing, hygiene/grooming, and locomotion level  PARTICIPATION LIMITATIONS: meal prep, cleaning, laundry, driving, shopping, community activity, and yard work  PERSONAL FACTORS: Age, Fitness, Past/current experiences, Transportation, and 1 comorbidity: 3 CVAs  are also affecting patient's functional outcome.   REHAB POTENTIAL: Good  CLINICAL DECISION MAKING: Stable/uncomplicated  EVALUATION COMPLEXITY: Low  PLAN:  PT FREQUENCY: 2x/week (potential to start aquatic therapy at end of June)  PT DURATION: 6 weeks  PLANNED INTERVENTIONS: Therapeutic exercises, Therapeutic activity, Neuromuscular re-education, Balance training, Gait training, Patient/Family education, Self Care, Joint mobilization, Stair training, Orthotic/Fit training, DME instructions, Aquatic Therapy, Electrical stimulation, Manual therapy, and Re-evaluation  PLAN FOR NEXT SESSION: LAND: and update goals. Add to  HEP for BLE strength, deficits highlighted by Sharlene Motts. Walking program, scifit for endurance   POOL: Endurance, bilateral knee pain, LE strengthening, walking, floating on noodle in saddle > peddling    Sadie Haber, PT, DPT 02/19/2023, 6:24 AM

## 2023-02-20 ENCOUNTER — Ambulatory Visit: Payer: 59 | Attending: Sports Medicine | Admitting: Physical Therapy

## 2023-02-20 ENCOUNTER — Encounter: Payer: Self-pay | Admitting: Physical Therapy

## 2023-02-20 NOTE — Therapy (Signed)
[facility] [address] [place], [place], [address] Phone: [phone]   Fax:  [phone]  Patient Details  Name: Jennifer Jacobs MRN: [id number] Date of Birth: [birth year] Referring Provider:  No ref. provider found  Encounter Date: 02/20/2023  Called and spoke to pt regarding no-show to today's appointment. Pt reports she forgot she had an appointment today but confirmed her next appointment date and time.   Jennifer Jacobs, PT 02/20/2023, 2:43 PM  [facility] [address] [place], [place], [address] Phone: [phone]   Fax:  [phone]

## 2023-02-25 ENCOUNTER — Ambulatory Visit: Payer: 59 | Admitting: Physical Therapy

## 2023-02-28 ENCOUNTER — Ambulatory Visit: Payer: 59 | Admitting: Physical Therapy

## 2023-02-28 ENCOUNTER — Emergency Department (HOSPITAL_COMMUNITY): Payer: 59

## 2023-02-28 ENCOUNTER — Encounter (HOSPITAL_COMMUNITY): Payer: Self-pay | Admitting: Emergency Medicine

## 2023-02-28 ENCOUNTER — Emergency Department (HOSPITAL_COMMUNITY)
Admission: EM | Admit: 2023-02-28 | Discharge: 2023-02-28 | Disposition: A | Discharge status: Home / Self Care | Payer: 59 | Attending: Emergency Medicine | Admitting: Emergency Medicine

## 2023-02-28 DIAGNOSIS — S99911A Unspecified injury of right ankle, initial encounter: Secondary | ICD-10-CM | POA: Diagnosis present

## 2023-02-28 DIAGNOSIS — Z7901 Long term (current) use of anticoagulants: Secondary | ICD-10-CM | POA: Diagnosis not present

## 2023-02-28 DIAGNOSIS — W010XXA Fall on same level from slipping, tripping and stumbling without subsequent striking against object, initial encounter: Secondary | ICD-10-CM | POA: Diagnosis not present

## 2023-02-28 DIAGNOSIS — S8254XA Nondisplaced fracture of medial malleolus of right tibia, initial encounter for closed fracture: Secondary | ICD-10-CM

## 2023-02-28 DIAGNOSIS — M25571 Pain in right ankle and joints of right foot: Secondary | ICD-10-CM | POA: Diagnosis not present

## 2023-02-28 MED ORDER — OXYCODONE-ACETAMINOPHEN 5-325 MG PO TABS: 1.0000 | ORAL_TABLET | Freq: Four times a day (QID) | ORAL | 0 refills | Status: DC | PRN

## 2023-02-28 MED ORDER — OXYCODONE-ACETAMINOPHEN 5-325 MG PO TABS
1.0000 | ORAL_TABLET | Freq: Once | ORAL | Status: AC
  Administered 2023-02-28: 1 via ORAL
  Filled 2023-02-28: qty 1

## 2023-02-28 NOTE — ED Notes (Signed)
[facility] called for transport home. 

## 2023-02-28 NOTE — Progress Notes (Signed)
Orthopedic Tech Progress Note Patient Details:  Jennifer Jacobs [birth year] [id number]  Ortho Devices Type of Ortho Device: Ace wrap, Stirrup splint, Post (short leg) splint Ortho Device/Splint Location: well padded fiberglass short leg posterior and stirrup splint applied to right leg Ortho Device/Splint Interventions: Ordered, Application, Adjustment   Post Interventions Patient Tolerated: Well Instructions Provided: Adjustment of device, Care of device  Kizzie Fantasia 02/28/2023, 4:51 PM

## 2023-02-28 NOTE — Progress Notes (Signed)
    Durable Medical Equipment  (From admission, onward)           Start     Ordered   02/28/23 1736  For home use only DME wheelchair cushion (seat and back)  Once       Comments: Patient has an acute fracture of ankle and is non-weight bearing. She has a decline in functional status requiring wheelchair   02/28/23 1735

## 2023-02-28 NOTE — ED Provider Notes (Signed)
[facility] EMERGENCY DEPARTMENT AT [facility] Provider Note   CSN: [id number] Arrival date & time: 02/28/23  1508     History  Chief Complaint  Patient presents with   Ankle Pain    Jennifer Jacobs is a 77 y.o. female.  The history is provided by the patient and medical records. No language interpreter was used.  Ankle Pain    77 year old female with significant history of blood clot currently on Eliquis, claudication, AAA, brought here via EMS from home for evaluation of ankle injury.  Patient reports 5 days ago she was walking, tripped over her shoes, and slipped hurting her right ankle in the process.  She reported an acute onset of sharp throbbing pain about the ankle with increased swelling about the ankle.  Pain is moderate to severe, worse with movement.  She tries to keep it elevated, rubbing with alcohol without improvement.  She endorsed some tingling sensation to her pinky toe.  She denies any problem with her knee or hip.  She denies hitting her head or loss of consciousness.  Home Medications Prior to Admission medications   Medication Sig Start Date End Date Taking? Authorizing Provider  albuterol (VENTOLIN HFA) 108 (90 Base) MCG/ACT inhaler Inhale into the lungs every 6 (six) hours as needed for wheezing or shortness of breath.    [provider]  apixaban (ELIQUIS) 5 MG TABS tablet Take 5 mg by mouth daily.    [provider]  BESIVANCE 0.6 % SUSP Place 1 drop into the left eye 3 (three) times daily.  06/11/18   [provider]  DUREZOL 0.05 % EMUL Place 1 drop into the left eye 3 (three) times daily.  06/11/18   [provider]  escitalopram (LEXAPRO) 20 MG tablet Take 1 tablet (20 mg total) by mouth 2 (two) times daily. 03/18/18   Myrlene Broker, MD  gabapentin (NEURONTIN) 800 MG tablet Take 800 mg by mouth 3 (three) times daily.  11/05/17   [provider]  lamoTRIgine (LAMICTAL) 100 MG tablet Take 1 tablet  (100 mg total) by mouth 2 (two) times daily. 03/18/18 12/18/19  Myrlene Broker, MD  methocarbamol (ROBAXIN) 500 MG tablet Take 1 tablet (500 mg total) by mouth 2 (two) times daily as needed for muscle spasms. 02/26/22   Mesner, Barbara Cower, MD  metoprolol tartrate (LOPRESSOR) 25 MG tablet Take 1 tablet (25 mg total) by mouth 2 (two) times daily. 12/18/19 03/17/20  Antoine Poche, MD  olmesartan (BENICAR) 20 MG tablet Take 1 tablet (20 mg total) by mouth daily. 04/04/18   Antoine Poche, MD  oxybutynin (DITROPAN-XL) 10 MG 24 hr tablet Take 1 tablet (10 mg total) by mouth at bedtime. 12/10/17   Eustace Moore, MD  PROLENSA 0.07 % SOLN Place 1 drop into the left eye daily.  06/11/18   [provider]  risperiDONE (RISPERDAL) 0.5 MG tablet Take 1 tablet (0.5 mg total) by mouth at bedtime. 03/18/18   Myrlene Broker, MD  traMADol (ULTRAM) 50 MG tablet Take by mouth every 6 (six) hours as needed.    [provider]  traZODone (DESYREL) 50 MG tablet Take 1 tablet (50 mg total) by mouth at bedtime. 03/18/18   Myrlene Broker, MD      Allergies    Aspirin and Prozac [fluoxetine hcl]    Review of Systems   Review of Systems  All other systems reviewed and are negative.   Physical Exam Updated  Vital Signs BP 116/82   Pulse 97   Temp 97.9 F (36.6 C) (Oral)   Resp 18   SpO2 100%  Physical Exam Vitals and nursing note reviewed.  Constitutional:      General: She is not in acute distress.    Appearance: She is well-developed. She is obese.  HENT:     Head: Atraumatic.  Eyes:     Conjunctiva/sclera: Conjunctivae normal.  Pulmonary:     Effort: Pulmonary effort is normal.  Musculoskeletal:        General: Signs of injury (Right ankle: Moderate edema noted about the ankle with tenderness to both medial and lateral malleolus and decreased range of motion.  Intact dorsalis pedis pulse with brisk cap refill to the toes.) present.     Cervical back: Neck supple.     Comments:  Right hip and knee nontender.  Skin:    Findings: No rash.  Neurological:     Mental Status: She is alert.  Psychiatric:        Mood and Affect: Mood normal.     ED Results / Procedures / Treatments   Labs (all labs ordered are listed, but only abnormal results are displayed) Labs Reviewed - No data to display  EKG None  Radiology DG Ankle Complete Right  Result Date: 02/28/2023 CLINICAL DATA:  Fall EXAM: RIGHT ANKLE - COMPLETE 3 VIEW COMPARISON:  None Available. FINDINGS: Nondisplaced fracture of the medial malleolus. Ankle mortise is maintained on nonweightbearing views. No evidence of arthropathy or other focal bone abnormality. Marked soft tissue edema of the ankle and forefoot. IMPRESSION: Nondisplaced fracture of the medial malleolus. Electronically Signed   By: Allegra Lai M.D.   On: 02/28/2023 16:09    Procedures Procedures    Medications Ordered in ED Medications  oxyCODONE-acetaminophen (PERCOCET/ROXICET) 5-325 MG per tablet 1 tablet (1 tablet Oral Given 02/28/23 1611)    ED Course/ Medical Decision Making/ A&P                             Medical Decision Making Amount and/or Complexity of Data Reviewed Radiology: ordered.  Risk Prescription drug management.   BP 116/82   Pulse 97   Temp 97.9 F (36.6 C) (Oral)   Resp 18   SpO2 100%   36:71 PM 77 year old female with significant history of blood clot currently on Eliquis, claudication, AAA, brought here via EMS from home for evaluation of ankle injury.  Patient reports 5 days ago she was walking, tripped over her shoes, and slipped hurting her right ankle in the process.  She reported an acute onset of sharp throbbing pain about the ankle with increased swelling about the ankle.  Pain is moderate to severe, worse with movement.  She tries to keep it elevated, rubbing with alcohol without improvement.  She endorsed some tingling sensation to her pinky toe.  She denies any problem with her knee or hip.   She denies hitting her head or loss of consciousness.  On exam this is an obese female laying in the chair appears slightly uncomfortable.  She is having her right leg propped up on a chair.  Exam remarkable for significant edema and tenderness noted to her right ankle at the medial and lateral malleoli region with decreased range of motion.  She is neurovascularly intact.  No open wound.  Right knee and hip nontender.  4:18 PM X-ray of the right ankle obtained independently viewed interpreted  by me which demonstrate a nondisplaced fracture of the medial malleolus.  Agree with radiology interpretation.  This is a closed injury.  This injury should be nonweightbearing.  Patient will be placed in an ankle splint, I have also request TOC social work to help with obtaining a wheel chair. I will provide opiate pain medication to use as needed.  Care discussed with Dr. Theresia Lo.  Patient given opiate pain medication with improvement of symptoms.  Patient is neurovascular intact post splint placement.  DDx: strain, sprain, fracture, dislocation.        Final Clinical Impression(s) / ED Diagnoses Final diagnoses:  Closed nondisplaced fracture of medial malleolus of right tibia, initial encounter    Rx / DC Orders ED Discharge Orders          Ordered    oxyCODONE-acetaminophen (PERCOCET) 5-325 MG tablet  Every 6 hours PRN        02/28/23 1724              Fayrene Helper, PA-C 02/28/23 1726    Elayne Snare K, DO 02/28/23 2330

## 2023-02-28 NOTE — Care Management (Addendum)
Transition of Care [facility]) - Emergency Department Mini Assessment   Patient Details  Name: Jennifer Jacobs MRN: [id number] Date of Birth: 06/03/1946  Transition of Care [facility]) CM/SW Contact:    Lavenia Atlas, RN Phone Number: 02/28/2023, 5:22 PM   Clinical Narrative: Jayme Cloud consult for DME: wheelchair. Spoke with patient at bedside to offer choice. Patient reports she has a rollator walker however having difficulty using since recent fall. This RNCM notified EDP to update current DME order to reflect change in mobility as patient now needs a wheelchair. Patient reports she has home health services [facility] with [facility], 3 hours in the am every day. Patient reports her neighbor will be available to help her get in her house and she has her house keys.  This RNCM notified [employer] [facility] for DME referral. Will plan to deliver to bedside. It's close to after hours, will await EDP DME order update and wheelchair delivery.   Transportation at discharge: [facility]   TOC will continue to follow - 5:36pm this RNCM spoke with [employer] who reports due to no updated DME order they are unable deliver wheelchair to patient's home/bedside. Patient is non weight bearing and lives alone, will need to have someone there to receive wheelchair and her in the am. Patient reports she does not have anyone to transport the DME. EDP, PA, RN notified.  TOC following for needs.    - 6:29 pm This RNCM received secure chat from RN that patient is requesting to go home and will receive her wheelchair tomorrow am. RN will call transport. This RNCM will fax orders to [employer] after hours.     ED Mini Assessment: What brought you to the Emergency Department? : fall 5 days ago with ankle injury  Barriers to Discharge: Continued Medical Work up  [employer] interventions: DME: wheelchair  Means of departure: Ambulance  Interventions which prevented an admission or readmission: DME Provided    Patient Contact  and Communications        ,          Patient states their goals for this hospitalization and ongoing recovery are:: return home CMS Medicare.gov Compare Post Acute Care list provided to:: Patient Choice offered to / list presented to : Patient  Admission diagnosis:  rt ankle pain Patient Active Problem List   Diagnosis Date Noted   Acute pulmonary embolism (HCC) 01/13/2018   Chest pain 01/11/2018   Elevated troponin 01/11/2018   AKI (acute kidney injury) (HCC) 01/11/2018   NSTEMI (non-ST elevated myocardial infarction) (HCC) 01/11/2018   Vascular headache    Seizure prophylaxis    Weakness of left third cranial nerve    Gait disturbance, post-stroke 11/22/2016   Benign essential HTN    Seizure disorder (HCC)    Acute blood loss anemia    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   Bilateral leg pain 06/26/2016   AAA (abdominal aortic aneurysm) without rupture (HCC) 06/26/2016   Insomnia 06/07/2015   Urinary incontinence 06/07/2015   Chronic pain syndrome 03/04/2015   Fibromyalgia 03/04/2015   Cervical radiculitis 03/04/2015   Lumbar radicular pain 03/04/2015   Major depression 07/05/2014   Claudication, class I (HCC) 03/18/2013   Smoker unmotivated to quit    PCP:  Loura Back, NP Pharmacy:   [facility] [facility] [address] - [place], [place] - [address] [facility] [address] [address] [place] [address] Phone: [phone] Fax: [phone]  [facility] - [place] -  [address] [place] [address] Phone: [phone] Fax: [phone]  [facility] - [place], [place] - [address] [place] [address] Phone: [phone] Fax: [phone]

## 2023-02-28 NOTE — ED Triage Notes (Signed)
Pt arrives via EMS from home with reports of a fall 5 days ago. Pt has been icing and resting since fall. Endorses pain and swelling to right ankle.

## 2023-02-28 NOTE — Discharge Instructions (Addendum)
You have been evaluated for your ankle injury.  X-ray demonstrate broken bone involving your right tibia.  Please do not bear any weight on this ankle.  Use wheelchair as instructed.  Call and follow-up closely with orthopedic specialist for outpatient management.  I have prescribed pain medication but be aware it can cause drowsiness.

## 2023-03-04 ENCOUNTER — Ambulatory Visit: Payer: 59 | Admitting: Physical Therapy

## 2023-03-04 ENCOUNTER — Telehealth: Payer: Self-pay | Admitting: Physical Therapy

## 2023-03-04 NOTE — Telephone Encounter (Signed)
LVM for patient at preferred number in chart stating cancellation of all PT appts due to recent ankle fracture with non-weight-bearing orders.  Informed of process of obtaining new referral to return when able to stand on ankle.  PT to proceed with canceling all appts.  Provided office number for questions or concerns.  Camille Bal, PT, DPT

## 2023-03-07 ENCOUNTER — Ambulatory Visit: Payer: 59 | Admitting: Physical Therapy

## 2023-03-11 ENCOUNTER — Ambulatory Visit: Payer: 59 | Admitting: Physical Therapy

## 2023-03-12 ENCOUNTER — Other Ambulatory Visit: Payer: Self-pay | Admitting: Optometry

## 2023-03-12 DIAGNOSIS — H532 Diplopia: Secondary | ICD-10-CM

## 2023-03-14 ENCOUNTER — Ambulatory Visit: Payer: 59 | Admitting: Physical Therapy

## 2023-03-18 ENCOUNTER — Ambulatory Visit: Payer: 59 | Admitting: Physical Therapy

## 2023-03-21 ENCOUNTER — Ambulatory Visit: Payer: 59 | Admitting: Physical Therapy

## 2023-03-21 ENCOUNTER — Telehealth: Payer: Self-pay

## 2023-03-21 NOTE — Telephone Encounter (Signed)
Patient called stating the wheelchair she was given is too big. CSW will reach out to case management.

## 2023-03-21 NOTE — Telephone Encounter (Signed)
Received message from ED SW re: patient needing a replacement wheelchair, due to the w/c delivered to her is too large.   This RNCM spoke with patient and her HHA who reports they need another prescription for the patient's wheelchair. Per chart review patient was seen in [facility] ED on 02/28/23. [employer] delivered the wheelchair to patients home on 03/01/23.  This RNCM advised patient and HHA that patient will need to have a follow up appointment w/ her PCP 3-7 days after a ED visit and her PCP will need to assist with getting an updated RX for DME: wheelchair. Once the patient leaves the ED we no longer follow the patient.   No additional TOC needs at this time.

## 2023-03-22 ENCOUNTER — Ambulatory Visit: Payer: 59 | Admitting: Sports Medicine

## 2023-03-25 ENCOUNTER — Other Ambulatory Visit: Payer: Self-pay | Admitting: Registered Nurse

## 2023-03-25 DIAGNOSIS — R519 Headache, unspecified: Secondary | ICD-10-CM

## 2023-03-25 DIAGNOSIS — Z9889 Other specified postprocedural states: Secondary | ICD-10-CM

## 2023-03-28 ENCOUNTER — Ambulatory Visit: Payer: 59 | Admitting: Physical Therapy

## 2023-04-12 ENCOUNTER — Other Ambulatory Visit: Payer: 59

## 2023-04-26 ENCOUNTER — Other Ambulatory Visit: Payer: Self-pay | Admitting: Registered Nurse

## 2023-04-26 DIAGNOSIS — E2839 Other primary ovarian failure: Secondary | ICD-10-CM

## 2023-05-01 ENCOUNTER — Ambulatory Visit
Admission: RE | Admit: 2023-05-01 | Discharge: 2023-05-01 | Disposition: A | Discharge status: Home / Self Care | Payer: 59 | Source: Ambulatory Visit | Attending: Registered Nurse | Admitting: Registered Nurse

## 2023-05-01 ENCOUNTER — Ambulatory Visit
Admission: RE | Admit: 2023-05-01 | Discharge: 2023-05-01 | Disposition: A | Discharge status: Home / Self Care | Payer: 59 | Source: Ambulatory Visit | Attending: Optometry | Admitting: Optometry

## 2023-05-01 DIAGNOSIS — H532 Diplopia: Secondary | ICD-10-CM

## 2023-05-01 DIAGNOSIS — Z9889 Other specified postprocedural states: Secondary | ICD-10-CM

## 2023-05-01 DIAGNOSIS — R519 Headache, unspecified: Secondary | ICD-10-CM

## 2023-09-19 ENCOUNTER — Encounter: Payer: Self-pay | Admitting: Podiatry

## 2023-09-19 ENCOUNTER — Ambulatory Visit (INDEPENDENT_AMBULATORY_CARE_PROVIDER_SITE_OTHER): Payer: 59 | Admitting: Podiatry

## 2023-09-19 DIAGNOSIS — M79676 Pain in unspecified toe(s): Secondary | ICD-10-CM

## 2023-09-19 DIAGNOSIS — N179 Acute kidney failure, unspecified: Secondary | ICD-10-CM

## 2023-09-19 DIAGNOSIS — B351 Tinea unguium: Secondary | ICD-10-CM | POA: Diagnosis not present

## 2023-09-19 DIAGNOSIS — D689 Coagulation defect, unspecified: Secondary | ICD-10-CM

## 2023-09-19 HISTORY — DX: Coagulation defect, unspecified: D68.9

## 2023-09-19 NOTE — Progress Notes (Signed)
This patient presents to the office with long thick painful nails.  She was referred to this office.  The nails are painful walking and wearing her shoes.  No self treatment afforded. She presents to the office for evaluation and treatment.  Patient has history of Claudication and coagulation defect due to taking eliquis.  General Appearance  Alert, conversant and in no acute stress.  Vascular  Dorsalis pedis are palpable  bilaterally.  Posterior pulses are absent  B/L. Capillary return is within normal limits  bilaterally. Temperature is within normal limits  bilaterally.  Neurologic  Senn-Weinstein monofilament wire test within normal limits  bilaterally. Muscle power within normal limits bilaterally.  Nails Thick disfigured discolored nails with subungual debris  from hallux to fifth toes bilaterally. No evidence of bacterial infection or drainage bilaterally.  Orthopedic  No limitations of motion  feet .  No crepitus or effusions noted.  No bony pathology or digital deformities noted.  Skin  normotropic skin with no porokeratosis noted bilaterally.  No signs of infections or ulcers noted.     Onychomycosis  IE.  Debride nails with nail nipper and dremel tool.  RTC 3 months  Helane Gunther DPM

## 2023-12-18 ENCOUNTER — Encounter: Payer: Self-pay | Admitting: Podiatry

## 2023-12-18 ENCOUNTER — Ambulatory Visit (INDEPENDENT_AMBULATORY_CARE_PROVIDER_SITE_OTHER): Payer: 59 | Admitting: Podiatry

## 2023-12-18 DIAGNOSIS — I739 Peripheral vascular disease, unspecified: Secondary | ICD-10-CM

## 2023-12-18 DIAGNOSIS — B351 Tinea unguium: Secondary | ICD-10-CM | POA: Diagnosis not present

## 2023-12-18 DIAGNOSIS — D689 Coagulation defect, unspecified: Secondary | ICD-10-CM

## 2023-12-18 DIAGNOSIS — M79674 Pain in right toe(s): Secondary | ICD-10-CM

## 2023-12-18 DIAGNOSIS — M79675 Pain in left toe(s): Secondary | ICD-10-CM | POA: Diagnosis not present

## 2023-12-18 NOTE — Progress Notes (Signed)
 This patient presents to the office with long thick painful nails.  She was referred to this office.  The nails are painful walking and wearing her shoes.  No self treatment afforded. She presents to the office for evaluation and treatment.  Patient has history of Claudication and coagulation defect due to taking eliquis.  General Appearance  Alert, conversant and in no acute stress.  Vascular  Dorsalis pedis are palpable  bilaterally.  Posterior pulses are absent  B/L. Capillary return is within normal limits  bilaterally. Temperature is within normal limits  bilaterally.  Neurologic  Senn-Weinstein monofilament wire test within normal limits  bilaterally. Muscle power within normal limits bilaterally.  Nails Thick disfigured discolored nails with subungual debris  from hallux to fifth toes bilaterally. No evidence of bacterial infection or drainage bilaterally.  Orthopedic  No limitations of motion  feet .  No crepitus or effusions noted.  No bony pathology or digital deformities noted.  Skin  normotropic skin with no porokeratosis noted bilaterally.  No signs of infections or ulcers noted.     Onychomycosis  IE.  Debride nails with nail nipper and dremel tool.  RTC 3 months  Helane Gunther DPM

## 2024-02-26 ENCOUNTER — Ambulatory Visit: Admitting: Podiatry

## 2024-03-31 ENCOUNTER — Encounter: Payer: Self-pay | Admitting: Gastroenterology

## 2024-05-22 ENCOUNTER — Encounter: Payer: Self-pay | Admitting: Podiatry

## 2024-05-22 ENCOUNTER — Ambulatory Visit: Admitting: Podiatry

## 2024-05-22 ENCOUNTER — Ambulatory Visit: Admitting: Gastroenterology

## 2024-05-22 DIAGNOSIS — B351 Tinea unguium: Secondary | ICD-10-CM

## 2024-05-22 DIAGNOSIS — M79674 Pain in right toe(s): Secondary | ICD-10-CM

## 2024-05-22 DIAGNOSIS — D689 Coagulation defect, unspecified: Secondary | ICD-10-CM

## 2024-05-22 DIAGNOSIS — M79675 Pain in left toe(s): Secondary | ICD-10-CM | POA: Diagnosis not present

## 2024-05-22 NOTE — Progress Notes (Signed)
 This patient presents to the office with long thick painful nails.  She was referred to this office.  The nails are painful walking and wearing her shoes.  No self treatment afforded. She presents to the office for evaluation and treatment.  Patient has history of Claudication and coagulation defect due to taking eliquis.  General Appearance  Alert, conversant and in no acute stress.  Vascular  Dorsalis pedis are palpable  bilaterally.  Posterior pulses are absent  B/L. Capillary return is within normal limits  bilaterally. Temperature is within normal limits  bilaterally.  Neurologic  Senn-Weinstein monofilament wire test within normal limits  bilaterally. Muscle power within normal limits bilaterally.  Nails Thick disfigured discolored nails with subungual debris  from hallux to fifth toes bilaterally. No evidence of bacterial infection or drainage bilaterally.  Orthopedic  No limitations of motion  feet .  No crepitus or effusions noted.  No bony pathology or digital deformities noted.  Skin  normotropic skin with no porokeratosis noted bilaterally.  No signs of infections or ulcers noted.     Onychomycosis  IE.  Debride nails with nail nipper and dremel tool.  RTC 3 months  Helane Gunther DPM

## 2024-06-27 ENCOUNTER — Other Ambulatory Visit: Payer: Self-pay

## 2024-06-27 ENCOUNTER — Emergency Department (HOSPITAL_COMMUNITY)

## 2024-06-27 ENCOUNTER — Inpatient Hospital Stay (HOSPITAL_COMMUNITY)
Admission: EM | Admit: 2024-06-27 | Discharge: 2024-07-03 | DRG: 299 | Disposition: A | Discharge status: Home / Self Care | Attending: Internal Medicine | Admitting: Internal Medicine

## 2024-06-27 ENCOUNTER — Encounter (HOSPITAL_COMMUNITY): Payer: Self-pay | Admitting: *Deleted

## 2024-06-27 DIAGNOSIS — I7100 Dissection of unspecified site of aorta: Secondary | ICD-10-CM | POA: Diagnosis present

## 2024-06-27 DIAGNOSIS — I252 Old myocardial infarction: Secondary | ICD-10-CM

## 2024-06-27 DIAGNOSIS — Z86711 Personal history of pulmonary embolism: Secondary | ICD-10-CM | POA: Diagnosis not present

## 2024-06-27 DIAGNOSIS — J449 Chronic obstructive pulmonary disease, unspecified: Secondary | ICD-10-CM | POA: Diagnosis present

## 2024-06-27 DIAGNOSIS — F17210 Nicotine dependence, cigarettes, uncomplicated: Secondary | ICD-10-CM | POA: Diagnosis present

## 2024-06-27 DIAGNOSIS — E785 Hyperlipidemia, unspecified: Secondary | ICD-10-CM | POA: Diagnosis present

## 2024-06-27 DIAGNOSIS — I69319 Unspecified symptoms and signs involving cognitive functions following cerebral infarction: Secondary | ICD-10-CM

## 2024-06-27 DIAGNOSIS — M797 Fibromyalgia: Secondary | ICD-10-CM | POA: Diagnosis present

## 2024-06-27 DIAGNOSIS — I10 Essential (primary) hypertension: Secondary | ICD-10-CM | POA: Diagnosis present

## 2024-06-27 DIAGNOSIS — G8929 Other chronic pain: Secondary | ICD-10-CM | POA: Diagnosis present

## 2024-06-27 DIAGNOSIS — Z743 Need for continuous supervision: Secondary | ICD-10-CM | POA: Diagnosis not present

## 2024-06-27 DIAGNOSIS — R072 Precordial pain: Principal | ICD-10-CM

## 2024-06-27 DIAGNOSIS — Z79899 Other long term (current) drug therapy: Secondary | ICD-10-CM | POA: Diagnosis not present

## 2024-06-27 DIAGNOSIS — Z811 Family history of alcohol abuse and dependence: Secondary | ICD-10-CM

## 2024-06-27 DIAGNOSIS — F17200 Nicotine dependence, unspecified, uncomplicated: Secondary | ICD-10-CM | POA: Diagnosis not present

## 2024-06-27 DIAGNOSIS — Z9981 Dependence on supplemental oxygen: Secondary | ICD-10-CM | POA: Diagnosis not present

## 2024-06-27 DIAGNOSIS — I161 Hypertensive emergency: Secondary | ICD-10-CM | POA: Diagnosis present

## 2024-06-27 DIAGNOSIS — K5900 Constipation, unspecified: Secondary | ICD-10-CM | POA: Diagnosis present

## 2024-06-27 DIAGNOSIS — Z7901 Long term (current) use of anticoagulants: Secondary | ICD-10-CM

## 2024-06-27 DIAGNOSIS — Z8249 Family history of ischemic heart disease and other diseases of the circulatory system: Secondary | ICD-10-CM

## 2024-06-27 DIAGNOSIS — Z886 Allergy status to analgesic agent status: Secondary | ICD-10-CM

## 2024-06-27 DIAGNOSIS — N170 Acute kidney failure with tubular necrosis: Secondary | ICD-10-CM | POA: Diagnosis not present

## 2024-06-27 DIAGNOSIS — Z800 Family history of malignant neoplasm of digestive organs: Secondary | ICD-10-CM | POA: Diagnosis not present

## 2024-06-27 DIAGNOSIS — N179 Acute kidney failure, unspecified: Secondary | ICD-10-CM | POA: Diagnosis present

## 2024-06-27 DIAGNOSIS — R6889 Other general symptoms and signs: Secondary | ICD-10-CM | POA: Diagnosis not present

## 2024-06-27 DIAGNOSIS — Z8041 Family history of malignant neoplasm of ovary: Secondary | ICD-10-CM | POA: Diagnosis not present

## 2024-06-27 DIAGNOSIS — Z833 Family history of diabetes mellitus: Secondary | ICD-10-CM | POA: Diagnosis not present

## 2024-06-27 DIAGNOSIS — I7103 Dissection of thoracoabdominal aorta: Secondary | ICD-10-CM | POA: Diagnosis not present

## 2024-06-27 DIAGNOSIS — I71019 Dissection of thoracic aorta, unspecified: Secondary | ICD-10-CM | POA: Diagnosis present

## 2024-06-27 DIAGNOSIS — Z87891 Personal history of nicotine dependence: Secondary | ICD-10-CM | POA: Diagnosis not present

## 2024-06-27 DIAGNOSIS — D72829 Elevated white blood cell count, unspecified: Secondary | ICD-10-CM | POA: Diagnosis present

## 2024-06-27 DIAGNOSIS — Z818 Family history of other mental and behavioral disorders: Secondary | ICD-10-CM

## 2024-06-27 DIAGNOSIS — G40909 Epilepsy, unspecified, not intractable, without status epilepticus: Secondary | ICD-10-CM | POA: Diagnosis present

## 2024-06-27 DIAGNOSIS — R7303 Prediabetes: Secondary | ICD-10-CM | POA: Diagnosis present

## 2024-06-27 DIAGNOSIS — Z87898 Personal history of other specified conditions: Secondary | ICD-10-CM

## 2024-06-27 DIAGNOSIS — I71012 Dissection of descending thoracic aorta: Principal | ICD-10-CM | POA: Diagnosis present

## 2024-06-27 DIAGNOSIS — R739 Hyperglycemia, unspecified: Secondary | ICD-10-CM | POA: Diagnosis not present

## 2024-06-27 DIAGNOSIS — Z888 Allergy status to other drugs, medicaments and biological substances status: Secondary | ICD-10-CM

## 2024-06-27 LAB — URINALYSIS, ROUTINE W REFLEX MICROSCOPIC
Bilirubin Urine: NEGATIVE
Glucose, UA: NEGATIVE mg/dL
Ketones, ur: NEGATIVE mg/dL
Leukocytes,Ua: NEGATIVE
Nitrite: NEGATIVE
Protein, ur: NEGATIVE mg/dL
Specific Gravity, Urine: 1.016 (ref 1.005–1.030)
pH: 7 (ref 5.0–8.0)

## 2024-06-27 LAB — CBC WITH DIFFERENTIAL/PLATELET
Abs Immature Granulocytes: 0.03 K/uL (ref 0.00–0.07)
Basophils Absolute: 0 K/uL (ref 0.0–0.1)
Basophils Relative: 0 %
Eosinophils Absolute: 0.1 K/uL (ref 0.0–0.5)
Eosinophils Relative: 1 %
HCT: 46 % (ref 36.0–46.0)
Hemoglobin: 14.3 g/dL (ref 12.0–15.0)
Immature Granulocytes: 0 %
Lymphocytes Relative: 41 %
Lymphs Abs: 3.7 K/uL (ref 0.7–4.0)
MCH: 29.4 pg (ref 26.0–34.0)
MCHC: 31.1 g/dL (ref 30.0–36.0)
MCV: 94.5 fL (ref 80.0–100.0)
Monocytes Absolute: 1 K/uL (ref 0.1–1.0)
Monocytes Relative: 11 %
Neutro Abs: 4.3 K/uL (ref 1.7–7.7)
Neutrophils Relative %: 47 %
Platelets: 197 K/uL (ref 150–400)
RBC: 4.87 MIL/uL (ref 3.87–5.11)
RDW: 15 % (ref 11.5–15.5)
WBC: 9.1 K/uL (ref 4.0–10.5)
nRBC: 0 % (ref 0.0–0.2)

## 2024-06-27 LAB — COMPREHENSIVE METABOLIC PANEL WITH GFR
ALT: 14 U/L (ref 0–44)
AST: 20 U/L (ref 15–41)
Albumin: 3.8 g/dL (ref 3.5–5.0)
Alkaline Phosphatase: 96 U/L (ref 38–126)
Anion gap: 8 (ref 5–15)
BUN: 8 mg/dL (ref 8–23)
CO2: 28 mmol/L (ref 22–32)
Calcium: 9.8 mg/dL (ref 8.9–10.3)
Chloride: 106 mmol/L (ref 98–111)
Creatinine, Ser: 0.93 mg/dL (ref 0.44–1.00)
GFR, Estimated: >60 mL/min (ref >60)
Glucose, Bld: 104 mg/dL — ABNORMAL HIGH (ref 70–99)
Potassium: 3.8 mmol/L (ref 3.5–5.1)
Sodium: 142 mmol/L (ref 135–145)
Total Bilirubin: 0.5 mg/dL (ref 0.0–1.2)
Total Protein: 7.3 g/dL (ref 6.5–8.1)

## 2024-06-27 LAB — TROPONIN T, HIGH SENSITIVITY
Troponin T High Sensitivity: <15 ng/L (ref 0–19)
Troponin T High Sensitivity: <15 ng/L (ref 0–19)

## 2024-06-27 LAB — LIPASE, BLOOD: Lipase: 55 U/L — ABNORMAL HIGH (ref 11–51)

## 2024-06-27 LAB — CBG MONITORING, ED: Glucose-Capillary: 105 mg/dL — ABNORMAL HIGH (ref 70–99)

## 2024-06-27 LAB — MRSA NEXT GEN BY PCR, NASAL: MRSA by PCR Next Gen: NOT DETECTED

## 2024-06-27 MED ORDER — CLEVIDIPINE BUTYRATE 0.5 MG/ML IV EMUL
0.0000 mg/h | INTRAVENOUS | Status: DC
  Administered 2024-06-27: 14 mg/h via INTRAVENOUS
  Administered 2024-06-27: 18 mg/h via INTRAVENOUS
  Administered 2024-06-27: 14 mg/h via INTRAVENOUS
  Administered 2024-06-27: 18 mg/h via INTRAVENOUS
  Administered 2024-06-27: 2 mg/h via INTRAVENOUS
  Administered 2024-06-28: 18 mg/h via INTRAVENOUS
  Administered 2024-06-28: 14 mg/h via INTRAVENOUS
  Administered 2024-06-28: 6 mg/h via INTRAVENOUS
  Administered 2024-06-28 (×2): 20 mg/h via INTRAVENOUS
  Administered 2024-06-29: 8 mg/h via INTRAVENOUS
  Administered 2024-06-29: 6 mg/h via INTRAVENOUS
  Administered 2024-06-29: 12 mg/h via INTRAVENOUS
  Administered 2024-06-29: 8 mg/h via INTRAVENOUS
  Filled 2024-06-27: qty 50
  Filled 2024-06-27 (×2): qty 100
  Filled 2024-06-27: qty 300
  Filled 2024-06-27: qty 50
  Filled 2024-06-27 (×2): qty 100
  Filled 2024-06-27 (×2): qty 200
  Filled 2024-06-27 (×4): qty 100

## 2024-06-27 MED ORDER — POLYETHYLENE GLYCOL 3350 17 G PO PACK: 17.0000 g | PACK | Freq: Every day | ORAL | Status: DC | PRN

## 2024-06-27 MED ORDER — LOSARTAN POTASSIUM 25 MG PO TABS: 100.0000 mg | ORAL_TABLET | Freq: Every day | ORAL | Status: DC

## 2024-06-27 MED ORDER — OXYCODONE HCL 5 MG PO TABS
5.0000 mg | ORAL_TABLET | ORAL | Status: DC | PRN
  Administered 2024-06-27 – 2024-07-03 (×11): 5 mg via ORAL
  Filled 2024-06-27 (×12): qty 1

## 2024-06-27 MED ORDER — DOCUSATE SODIUM 100 MG PO CAPS: 100.0000 mg | ORAL_CAPSULE | Freq: Two times a day (BID) | ORAL | Status: DC | PRN

## 2024-06-27 MED ORDER — ROSUVASTATIN CALCIUM 20 MG PO TABS
20.0000 mg | ORAL_TABLET | Freq: Every day | ORAL | Status: DC
  Administered 2024-06-27 – 2024-07-03 (×7): 20 mg via ORAL
  Filled 2024-06-27 (×7): qty 1

## 2024-06-27 MED ORDER — FENTANYL CITRATE (PF) 50 MCG/ML IJ SOSY: 25.0000 ug | PREFILLED_SYRINGE | INTRAMUSCULAR | Status: DC | PRN

## 2024-06-27 MED ORDER — FENTANYL CITRATE (PF) 50 MCG/ML IJ SOSY
50.0000 ug | PREFILLED_SYRINGE | Freq: Once | INTRAMUSCULAR | Status: AC
  Administered 2024-06-27: 50 ug via INTRAVENOUS
  Filled 2024-06-27: qty 1

## 2024-06-27 MED ORDER — LOSARTAN POTASSIUM 25 MG PO TABS
100.0000 mg | ORAL_TABLET | Freq: Every day | ORAL | Status: DC
  Administered 2024-06-28: 100 mg via ORAL
  Filled 2024-06-27: qty 4

## 2024-06-27 MED ORDER — TRAZODONE HCL 50 MG PO TABS
50.0000 mg | ORAL_TABLET | Freq: Every evening | ORAL | Status: DC | PRN
Start: 2024-06-27 — End: 2024-07-03
  Administered 2024-06-27 – 2024-06-28 (×2): 50 mg via ORAL
  Filled 2024-06-27 (×2): qty 1

## 2024-06-27 MED ORDER — UMECLIDINIUM-VILANTEROL 62.5-25 MCG/ACT IN AEPB
1.0000 | INHALATION_SPRAY | Freq: Every day | RESPIRATORY_TRACT | Status: DC
  Administered 2024-06-28 – 2024-07-03 (×6): 1 via RESPIRATORY_TRACT
  Filled 2024-06-27: qty 14

## 2024-06-27 MED ORDER — IOHEXOL 350 MG/ML SOLN
100.0000 mL | Freq: Once | INTRAVENOUS | Status: AC | PRN
  Administered 2024-06-27: 100 mL via INTRAVENOUS

## 2024-06-27 MED ORDER — HYDROCODONE-ACETAMINOPHEN 5-325 MG PO TABS
1.0000 | ORAL_TABLET | Freq: Once | ORAL | Status: AC
  Administered 2024-06-27: 1 via ORAL
  Filled 2024-06-27: qty 1

## 2024-06-27 MED ORDER — NICOTINE 7 MG/24HR TD PT24
7.0000 mg | MEDICATED_PATCH | Freq: Every day | TRANSDERMAL | Status: DC
  Administered 2024-06-27 – 2024-07-03 (×8): 7 mg via TRANSDERMAL
  Filled 2024-06-27 (×7): qty 1

## 2024-06-27 MED ORDER — ORAL CARE MOUTH RINSE
15.0000 mL | OROMUCOSAL | Status: DC | PRN
Start: 2024-06-27 — End: 2024-07-03

## 2024-06-27 MED ORDER — CHLORHEXIDINE GLUCONATE CLOTH 2 % EX PADS
6.0000 | MEDICATED_PAD | Freq: Every day | CUTANEOUS | Status: DC
  Administered 2024-06-27 – 2024-07-03 (×7): 6 via TOPICAL

## 2024-06-27 MED ORDER — NICOTINE POLACRILEX 2 MG MT GUM
2.0000 mg | CHEWING_GUM | OROMUCOSAL | Status: DC | PRN
  Administered 2024-06-27: 2 mg via ORAL
  Filled 2024-06-27: qty 1
  Filled 2024-06-27: qty 3

## 2024-06-27 MED ORDER — ALUM & MAG HYDROXIDE-SIMETH 200-200-20 MG/5ML PO SUSP
30.0000 mL | ORAL | Status: DC | PRN
  Administered 2024-06-27: 30 mL via ORAL
  Filled 2024-06-27: qty 30

## 2024-06-27 MED ORDER — LEVETIRACETAM 500 MG PO TABS
500.0000 mg | ORAL_TABLET | Freq: Two times a day (BID) | ORAL | Status: DC
  Administered 2024-06-27 – 2024-07-03 (×12): 500 mg via ORAL
  Filled 2024-06-27 (×2): qty 1
  Filled 2024-06-27 (×2): qty 2
  Filled 2024-06-27: qty 1
  Filled 2024-06-27 (×4): qty 2
  Filled 2024-06-27: qty 1
  Filled 2024-06-27 (×2): qty 2

## 2024-06-27 MED ORDER — ESMOLOL HCL-SODIUM CHLORIDE 2000 MG/100ML IV SOLN
25.0000 ug/kg/min | INTRAVENOUS | Status: DC
  Administered 2024-06-27: 75 ug/kg/min via INTRAVENOUS
  Administered 2024-06-27: 25 ug/kg/min via INTRAVENOUS
  Administered 2024-06-28 (×2): 200 ug/kg/min via INTRAVENOUS
  Administered 2024-06-28: 125 ug/kg/min via INTRAVENOUS
  Administered 2024-06-28: 200 ug/kg/min via INTRAVENOUS
  Administered 2024-06-28: 175 ug/kg/min via INTRAVENOUS
  Administered 2024-06-28: 125 ug/kg/min via INTRAVENOUS
  Administered 2024-06-28 (×3): 200 ug/kg/min via INTRAVENOUS
  Administered 2024-06-28 (×2): 175 ug/kg/min via INTRAVENOUS
  Administered 2024-06-29: 100 ug/kg/min via INTRAVENOUS
  Administered 2024-06-29: 200 ug/kg/min via INTRAVENOUS
  Administered 2024-06-29 (×2): 225 ug/kg/min via INTRAVENOUS
  Administered 2024-06-29 (×2): 200 ug/kg/min via INTRAVENOUS
  Administered 2024-06-29 (×3): 175 ug/kg/min via INTRAVENOUS
  Administered 2024-06-29: 200 ug/kg/min via INTRAVENOUS
  Administered 2024-06-29: 225 ug/kg/min via INTRAVENOUS
  Administered 2024-06-29: 200 ug/kg/min via INTRAVENOUS
  Administered 2024-06-30: 100 ug/kg/min via INTRAVENOUS
  Administered 2024-06-30: 25 ug/kg/min via INTRAVENOUS
  Administered 2024-06-30: 100 ug/kg/min via INTRAVENOUS
  Filled 2024-06-27: qty 100
  Filled 2024-06-27: qty 500
  Filled 2024-06-27: qty 100
  Filled 2024-06-27 (×2): qty 200
  Filled 2024-06-27 (×3): qty 100
  Filled 2024-06-27 (×2): qty 200
  Filled 2024-06-27 (×5): qty 100
  Filled 2024-06-27: qty 200
  Filled 2024-06-27: qty 100
  Filled 2024-06-27: qty 200

## 2024-06-27 NOTE — H&P (Signed)
 NAME:  Jennifer Jacobs, MRN:  [id number], DOB:  Jan 04, 1946, LOS: 0 ADMISSION DATE:  06/27/2024, CONSULTATION DATE:  11/8 REFERRING MD:  Jadine, CHIEF COMPLAINT:  type B dissection   History of Present Illness:  [name] is a 78 y/o woman with a history of cerebral aneurysms, previous strokes, MI, hypertension who presented with sudden onset chest pain radiating into her back.  She has had pain like this a long time ago, but not recently.  Systolic blood pressure in the 170s at presentation.  In the emergency department she was found to have a type B dissection.  She was started on esmolol  and clevidipine to get her blood pressure down.  She reports compliance with her blood pressure medications.  She uses pill packs and does not think she missed any doses this week.  She took her medications this morning.  No personal or family history of aortic dissection.  At present her pain is resolved.   Pertinent  Medical History  Cerebral aneurysms MI Hypertension Hyperlipidemia Fibromyalgia COPD on home oxygen  Significant Hospital Events: Including procedures, antibiotic start and stop dates in addition to other pertinent events   11 /8 admitted, started cleviprex & esmolol   Interim History / Subjective:    Objective    Blood pressure 105/70, pulse 71, temperature 98.2 F (36.8 C), temperature source Oral, resp. rate 16, height 5' 8 (1.727 m), weight 89.8 kg, SpO2 90%.       No intake or output data in the 24 hours ending 06/27/24 1612 Filed Weights   06/27/24 1141  Weight: 89.8 kg    Examination: General: Elderly woman sitting up in bed no acute distress HENT: [place]/AT, eyes anicteric Lungs: Breathing comfortably on nasal cannula, CTAB.  No conversational dyspnea. Cardiovascular: S1-S2, regular rate and rhythm Abdomen: Soft, nontender Extremities: No peripheral edema, no clubbing or cyanosis.  Symmetric PT  pulses Neuro: Awake, alert, answering questions appropriately,  moving all extremities  BUN 8 Creatinine 0.93 WBC 9.1 H/H 14.3/46 Platelets 197   Resolved problem list   Assessment and Plan   Type B aortic dissection Hypertensive emergency -Impulse control, goal heart rate less than 60 and systolic blood pressure less than 120.  Continue esmolol  and clevidipine as needed.  Can add IV hydralazine  for breakthrough as needed. -Hold PTA apixaban  -Fentanyl  and oxycodone  as needed for pain --losartan 100; on benicar  PTA -need med rec done> d/w pharmacy  History of seizures - Continue PTA Keppra   History of tobacco use - Nicotine  patch, gum as needed  History of seizures - Waiting on med rec, starting Keppra  500 mg twice daily from most recent available records    DVT prophylaxis: SCD `  Labs   CBC: Recent Labs  Lab 06/27/24 1144  WBC 9.1  NEUTROABS 4.3  HGB 14.3  HCT 46.0  MCV 94.5  PLT 197    Basic Metabolic Panel: Recent Labs  Lab 06/27/24 1144  NA 142  K 3.8  CL 106  CO2 28  GLUCOSE 104*  BUN 8  CREATININE 0.93  CALCIUM 9.8   GFR: Estimated Creatinine Clearance: 61.3 mL/min (by C-G formula based on SCr of 0.93 mg/dL). Recent Labs  Lab 06/27/24 1144  WBC 9.1    Liver Function Tests: Recent Labs  Lab 06/27/24 1144  AST 20  ALT 14  ALKPHOS 96  BILITOT 0.5  PROT 7.3  ALBUMIN  3.8   Recent Labs  Lab 06/27/24 1144  LIPASE 55*   No results for input(s):  AMMONIA in the last 168 hours.  ABG No results found for: PHART, PCO2ART, PO2ART, HCO3, TCO2, ACIDBASEDEF, O2SAT   Coagulation Profile: No results for input(s): INR, PROTIME in the last 168 hours.  Cardiac Enzymes: No results for input(s): CKTOTAL, CKMB, CKMBINDEX, TROPONINI in the last 168 hours.  HbA1C: No results found for: HGBA1C  CBG: Recent Labs  Lab 06/27/24 1147  GLUCAP 105*    Review of Systems:   Review of Systems  Constitutional:  Negative for chills and fever.  Respiratory: Negative.     Cardiovascular:  Positive for chest pain.  Gastrointestinal:  Negative for abdominal pain.  Neurological:  Negative for seizures.     Past Medical History:  She,  has a past medical history of Acute blood loss anemia, AKI (acute kidney injury) (01/11/2018), Alcohol abuse (07/05/2014), Allergy, Anxiety, Anxiety and depression, Arthritis, Coagulation defect (09/19/2023), Cognitive deficit due to recent stroke (11/22/2016), COPD (chronic obstructive pulmonary disease) (HCC), Depression, Dyslipidemia, Fibromyalgia, Hepatic cyst, Hyperlipidemia, Hypertension, Leukocytosis, Liver cyst (03/18/2014), MI (myocardial infarction) (HCC) (2007), Neuromuscular disorder (HCC), Seizure disorder (HCC), Seizure prophylaxis, Seizures (HCC), Smoker unmotivated to quit, Substance abuse (HCC), and Vision abnormalities.   Surgical History:   Past Surgical History:  Procedure Laterality Date   CATARACT EXTRACTION W/PHACO Right 06/30/2018   Procedure: CATARACT EXTRACTION PHACO AND INTRAOCULAR LENS PLACEMENT RIGHT EYE;  Surgeon: Perley Hamilton, MD;  Location: [facility];  Service: Ophthalmology;  Laterality: Right;  CDE: 9.90   CATARACT EXTRACTION W/PHACO Left 07/14/2018   Procedure: CATARACT EXTRACTION PHACO AND INTRAOCULAR LENS PLACEMENT (IOC);  Surgeon: Perley Hamilton, MD;  Location: [facility];  Service: Ophthalmology;  Laterality: Left;  CDE: 6.96   CRANIOTOMY Left 11/19/2016   Procedure: CRANIOTOMY INTRACRANIAL FOR  ANEURYSM CLIPPING;  Surgeon: Morene Hicks Ditty, MD;  Location[facility] OR;  Service: Neurosurgery;  Laterality: Left;   ELBOW ARTHROPLASTY Left    rods   Exercise tolerance test  04/27/2013   CPET-MET: Submaximal effort (0.96, with goal of greater than 1.09) -- patient states that her effort was limited due to knee pain.  This makes the rest of the interpretation difficult: Peak VO2 - 10.5 (59% moderate), peak 02-pulse -  7..11 (low); heart rate 114 (73% -chronic incompetence considered);; PFTs normal    MOUTH SURGERY      NM MYOVIEW  LTD  05/19/2013   LexiScan : EF 80%, no evidence of ischemia or infarction   TRANSTHORACIC ECHOCARDIOGRAM  03/25/2013   EF 55-60%, no regional wall motion abnormalities; normal diastolic parameters, normal pulmonary pressures.  No valvular lesions noted.  Essentially normal echo      Social History:   reports that she has been smoking cigarettes. She has never used smokeless tobacco. She reports that she does not currently use alcohol. She reports current drug use. Frequency: 2.00 times per week. Drug: Marijuana.   Family History:  Her family history includes Alcohol abuse in her brother, brother, father, mother, sister, sister, and son; Cancer in her maternal grandfather, paternal aunt, and paternal grandmother; Depression in her mother, sister, sister, and sister; Diabetes in her daughter; Heart attack in her maternal grandmother; Heart attack (age of onset: 38) in her father; Heart disease (age of onset: 64) in her daughter; Hypertension in her father; Ovarian cancer in her paternal aunt; Prostate cancer in her paternal uncle; Stomach cancer in her paternal aunt.   Allergies Allergies  Allergen Reactions   Aspirin  Nausea Only   Prozac  [Fluoxetine  Hcl] Other (See Comments)    Headaches  Home Medications  Prior to Admission medications   Medication Sig Start Date End Date Taking? Authorizing Provider  albuterol  (VENTOLIN  HFA) 108 (90 Base) MCG/ACT inhaler Inhale into the lungs every 6 (six) hours as needed for wheezing or shortness of breath.    [provider]  apixaban  (ELIQUIS ) 5 MG TABS tablet Take 5 mg by mouth daily.    [provider]  BESIVANCE 0.6 % SUSP Place 1 drop into the left eye 3 (three) times daily.  06/11/18   [provider]  DUREZOL 0.05 % EMUL Place 1 drop into the left eye 3 (three) times daily.  06/11/18   [provider]  escitalopram  (LEXAPRO ) 20 MG tablet Take 1 tablet (20 mg total) by mouth 2 (two) times  daily. 03/18/18   [name], MD  gabapentin  (NEURONTIN ) 800 MG tablet Take 800 mg by mouth 3 (three) times daily.  11/05/17   [provider]  lamoTRIgine  (LAMICTAL ) 100 MG tablet Take 1 tablet (100 mg total) by mouth 2 (two) times daily. 03/18/18 05/22/24  [name], MD  methocarbamol  (ROBAXIN ) 500 MG tablet Take 1 tablet (500 mg total) by mouth 2 (two) times daily as needed for muscle spasms. 02/26/22   Mesner, Selinda, MD  metoprolol  tartrate (LOPRESSOR ) 25 MG tablet Take 1 tablet (25 mg total) by mouth 2 (two) times daily. 12/18/19 05/22/24  Alvan Dorn FALCON, MD  olmesartan  (BENICAR ) 20 MG tablet Take 1 tablet (20 mg total) by mouth daily. 04/04/18   Alvan Dorn FALCON, MD  oxybutynin  (DITROPAN -XL) 10 MG 24 hr tablet Take 1 tablet (10 mg total) by mouth at bedtime. 12/10/17   [name], MD  oxyCODONE -acetaminophen  (PERCOCET) 5-325 MG tablet Take 1 tablet by mouth every 6 (six) hours as needed for moderate pain or severe pain. 02/28/23   Nivia Colon, PA-C  PROLENSA 0.07 % SOLN Place 1 drop into the left eye daily.  06/11/18   [provider]  risperiDONE  (RISPERDAL ) 0.5 MG tablet Take 1 tablet (0.5 mg total) by mouth at bedtime. 03/18/18   [name], MD  traMADol  (ULTRAM ) 50 MG tablet Take by mouth every 6 (six) hours as needed.    [provider]  traZODone  (DESYREL ) 50 MG tablet Take 1 tablet (50 mg total) by mouth at bedtime. 03/18/18   [name], MD     Critical care time: 38 min.    Leita SHAUNNA Gaskins, DO 06/27/24 6:01 PM [facility]  For contact information, see Amion. If no response to pager, please call PCCM consult pager. After hours, 7PM- 7AM, please call Elink.

## 2024-06-27 NOTE — ED Triage Notes (Addendum)
 BIB family from home for multiple complaints. Daughter reports she is altered (AMS). Woke c/o L side whole body pain. Then developed CP. Also endorses HA. Complex hx. H/o MI, CVA, SZ, takes eliquis  and multiple sedating meds. Denies recent fall or injury.

## 2024-06-27 NOTE — Progress Notes (Signed)
 eLink Physician-Brief Progress Note Patient Name: Jennifer Jacobs DOB: March 31, 1946 MRN: [id number]   Date of Service  06/27/2024  HPI/Events of Note  Patient with chest pain.  Admitted with uncomplicated type B aortic dissection.  She did receive f pain medications earlier with good pain relief.  Now complaining of a chest burning/indigestion feeling.  She states that it has been off-and-on throughout the day and she is rating the pain at a 7 out of 10 now. She also takes trazodone  at night as an outpatient for sleep and wants it restarted.  eICU Interventions  Will prescribe Maalox for heartburn and reorder trazodone  for bedtime.  Discussed with RN.     Intervention Category Intermediate Interventions: Pain - evaluation and management  Jerilynn Berg 06/27/2024, 9:34 PM

## 2024-06-27 NOTE — ED Provider Notes (Addendum)
 [place] EMERGENCY DEPARTMENT AT [facility] Provider Note   CSN: [id number] Arrival date & time: 06/27/24  1130     Patient presents with: Chest Pain   Jennifer Jacobs is a 78 y.o. female.   Patient on chronic pain medicine.  Patient takes hydrocodone  on a regular basis.  Says she had 2 tablets this morning.  But starting to feel pain again.  She came in for midsternal chest pain that radiates to the back.  That is her main complaint.  Associated with shortness of breath.  Several other things were listed but she does not seem to have main concerns regarding that.  Past medical history significant for arthritis fibromyalgia MI in 2007 hyperlipidemia history of substance abuse alcohol seizures in the past but none since 2002 hyperlipidemia as mentioned cognitive deficit due to recent stroke in 2018.  COPD.  Patient is an everyday smoker.  Denies alcohol use.   Patient states that the onset of pain was around 10 this morning.    Prior to Admission medications   Medication Sig Start Date End Date Taking? Authorizing Provider  albuterol  (VENTOLIN  HFA) 108 (90 Base) MCG/ACT inhaler Inhale into the lungs every 6 (six) hours as needed for wheezing or shortness of breath.    [provider]  apixaban  (ELIQUIS ) 5 MG TABS tablet Take 5 mg by mouth daily.    [provider]  BESIVANCE 0.6 % SUSP Place 1 drop into the left eye 3 (three) times daily.  06/11/18   [provider]  DUREZOL 0.05 % EMUL Place 1 drop into the left eye 3 (three) times daily.  06/11/18   [provider]  escitalopram  (LEXAPRO ) 20 MG tablet Take 1 tablet (20 mg total) by mouth 2 (two) times daily. 03/18/18   Jennifer Barnie SAUNDERS, MD  gabapentin  (NEURONTIN ) 800 MG tablet Take 800 mg by mouth 3 (three) times daily.  11/05/17   [provider]  lamoTRIgine  (LAMICTAL ) 100 MG tablet Take 1 tablet (100 mg total) by mouth 2 (two) times daily. 03/18/18 05/22/24  Jennifer Barnie SAUNDERS, MD   methocarbamol  (ROBAXIN ) 500 MG tablet Take 1 tablet (500 mg total) by mouth 2 (two) times daily as needed for muscle spasms. 02/26/22   Mesner, Selinda, MD  metoprolol  tartrate (LOPRESSOR ) 25 MG tablet Take 1 tablet (25 mg total) by mouth 2 (two) times daily. 12/18/19 05/22/24  Alvan Dorn FALCON, MD  olmesartan  (BENICAR ) 20 MG tablet Take 1 tablet (20 mg total) by mouth daily. 04/04/18   Alvan Dorn FALCON, MD  oxybutynin  (DITROPAN -XL) 10 MG 24 hr tablet Take 1 tablet (10 mg total) by mouth at bedtime. 12/10/17   [name], MD  oxyCODONE -acetaminophen  (PERCOCET) 5-325 MG tablet Take 1 tablet by mouth every 6 (six) hours as needed for moderate pain or severe pain. 02/28/23   Jennifer Colon, PA-C  PROLENSA 0.07 % SOLN Place 1 drop into the left eye daily.  06/11/18   [provider]  risperiDONE  (RISPERDAL ) 0.5 MG tablet Take 1 tablet (0.5 mg total) by mouth at bedtime. 03/18/18   Jennifer Barnie SAUNDERS, MD  traMADol  (ULTRAM ) 50 MG tablet Take by mouth every 6 (six) hours as needed.    [provider]  traZODone  (DESYREL ) 50 MG tablet Take 1 tablet (50 mg total) by mouth at bedtime. 03/18/18   Jennifer Barnie SAUNDERS, MD    Allergies: Aspirin  and Prozac  [fluoxetine  hcl]    Review of Systems  Constitutional:  Negative for chills  and fever.  HENT:  Negative for ear pain and sore throat.   Eyes:  Negative for pain and visual disturbance.  Respiratory:  Positive for shortness of breath. Negative for cough.   Cardiovascular:  Positive for chest pain. Negative for palpitations.  Gastrointestinal:  Negative for abdominal pain and vomiting.  Genitourinary:  Negative for dysuria and hematuria.  Musculoskeletal:  Positive for back pain and myalgias. Negative for arthralgias.  Skin:  Negative for color change and rash.  Neurological:  Negative for seizures and syncope.  All other systems reviewed and are negative.   Updated Vital Signs BP (!) 170/116 (BP Location: Right Arm)   Pulse 63   Temp 98.2  F (36.8 C) (Oral)   Resp 14   Ht 1.727 m (5' 8)   Wt 89.8 kg   SpO2 95%   BMI 30.11 kg/m   Physical Exam Vitals and nursing note reviewed.  Constitutional:      General: She is not in acute distress.    Appearance: Normal appearance. She is well-developed.  HENT:     Head: Normocephalic and atraumatic.     Mouth/Throat:     Mouth: Mucous membranes are moist.  Eyes:     Extraocular Movements: Extraocular movements intact.     Conjunctiva/sclera: Conjunctivae normal.     Pupils: Pupils are equal, round, and reactive to light.  Cardiovascular:     Rate and Rhythm: Normal rate and regular rhythm.     Heart sounds: No murmur heard. Pulmonary:     Effort: Pulmonary effort is normal. No respiratory distress.     Breath sounds: Normal breath sounds.  Abdominal:     Palpations: Abdomen is soft.     Tenderness: There is no abdominal tenderness.  Musculoskeletal:        General: No swelling.     Cervical back: Normal range of motion and neck supple.     Right lower leg: No edema.     Left lower leg: No edema.  Skin:    General: Skin is warm and dry.     Capillary Refill: Capillary refill takes less than 2 seconds.  Neurological:     General: No focal deficit present.     Mental Status: She is alert and oriented to person, place, and time.  Psychiatric:        Mood and Affect: Mood normal.     (all labs ordered are listed, but only abnormal results are displayed) Labs Reviewed  COMPREHENSIVE METABOLIC PANEL WITH GFR - Abnormal; Notable for the following components:      Result Value   Glucose, Bld 104 (*)    All other components within normal limits  LIPASE, BLOOD - Abnormal; Notable for the following components:   Lipase 55 (*)    All other components within normal limits  CBG MONITORING, ED - Abnormal; Notable for the following components:   Glucose-Capillary 105 (*)    All other components within normal limits  CBC WITH DIFFERENTIAL/PLATELET  URINALYSIS, ROUTINE W  REFLEX MICROSCOPIC  TROPONIN T, HIGH SENSITIVITY  TROPONIN T, HIGH SENSITIVITY    EKG: EKG Interpretation Date/Time:  Saturday June 27 2024 11:36:03 EST Ventricular Rate:  62 PR Interval:  151 QRS Duration:  120 QT Interval:  425 QTC Calculation: 432 R Axis:   -72  Text Interpretation: Sinus rhythm Incomplete RBBB and LAFB Probable left ventricular hypertrophy Anterior Q waves, possibly due to LVH No significant change since last tracing Confirmed by Lyn Joens [phone]) on  06/27/2024 12:52:33 PM  Radiology: DG Chest 2 View Result Date: 06/27/2024 EXAM: 2 VIEW(S) XRAY OF THE CHEST 06/27/2024 12:02:00 PM COMPARISON: None available. CLINICAL HISTORY: CP, AMS FINDINGS: LUNGS AND PLEURA: No focal pulmonary opacity. No pulmonary edema. No pleural effusion. No pneumothorax. HEART AND MEDIASTINUM: Cardiomegaly. There is enlargement of the transverse aortic arch which corresponds to findings on a remote CT of the chest from 02/26/2022 when the arch measured 4.2 cm in transverse diameter. Aortic atherosclerotic calcifications. BONES AND SOFT TISSUES: No acute osseous abnormality. IMPRESSION: 1. Cardiomegaly. 2. Enlargement of the transverse aortic arch, which corresponds to dilated transverse aortic arch noted on remote CT from 02/26/2022. Recommend follow-up imaging with CTA of the chest for more definitive evaluation characterization. 3. Aortic atherosclerotic calcifications. Electronically signed by: Waddell Calk MD 06/27/2024 12:24 PM EST RP Workstation: [id number]     Procedures   Medications Ordered in the ED  fentaNYL  (SUBLIMAZE ) injection 50 mcg (has no administration in time range)  clevidipine (CLEVIPREX) infusion 0.5 mg/mL (has no administration in time range)  esmolol  (BREVIBLOC ) 2000 mg / 100 mL (20 mg/mL) infusion (has no administration in time range)  HYDROcodone -acetaminophen  (NORCO/VICODIN) 5-325 MG per tablet 1 tablet (1 tablet Oral Given 06/27/24 1238)  iohexol   (OMNIPAQUE ) 350 MG/ML injection 100 mL (100 mLs Intravenous Contrast Given 06/27/24 1304)                                    Medical Decision Making Amount and/or Complexity of Data Reviewed Labs: ordered. Radiology: ordered.  Risk Prescription drug management. Decision regarding hospitalization.   Patient's vital signs here temp 98.2 pulse 63 respirations 14 blood pressure 170/116 oxygen saturation 95% on room air.  Very reassuring.  Patient will probably need delta troponins chest x-ray will go ahead and add on D-dimer.  Blood sugar 105 initial troponin less than 15.  CBC white count 9.1 hemoglobin 14.3 platelets are 197.  Complete metabolic panel normal including renal function test.  Patient's lipase is slightly elevated at 55.  Two-view chest cardiomegaly enlargement of the transverse aortic arch with corresponds to dilated trans.  Care of arch noted on remote CT from July 2023 recommend imaging with CTA of the chest for more definitive evaluation.  Troponin was less than 15.  Will get dissection study.  Called by radiology there is evidence of a type B or technically a type B dissection of the descending thoracic aorta.  Will begin Clevidipine for blood pressure control.  Goal will be systolic blood pressures 0100-0120.  And heart rate goal of less than 60.  Will give fentanyl  for pain control and will also start esmolol .  Will contact critical care.  CRITICAL CARE Performed by: Candia Kingsbury Total critical care time: 60 minutes Critical care time was exclusive of separately billable procedures and treating other patients. Critical care was necessary to treat or prevent imminent or life-threatening deterioration. Critical care was time spent personally by me on the following activities: development of treatment plan with patient and/or surrogate as well as nursing, discussions with consultants, evaluation of patient's response to treatment, examination of patient, obtaining  history from patient or surrogate, ordering and performing treatments and interventions, ordering and review of laboratory studies, ordering and review of radiographic studies, pulse oximetry and re-evaluation of patient's condition.  Patient's heart rate right now is in the 70s.  So the esmolol  may not be required will start the clevidipine first.  The recommendation is to maintain a heart rate greater than 60 bpm.  The pain control.  I discussed with critical care they will be down to see her.  Believe it is Dr. Theodoro.    Final diagnoses:  Precordial pain  Acute dissection of thoracic aorta [facility])    ED Discharge Orders     None          Geraldene Hamilton, MD 06/27/24 1334    Geraldene Hamilton, MD 06/27/24 1347    Geraldene Hamilton, MD 06/27/24 1349    Geraldene Hamilton, MD 06/27/24 1356    Geraldene Hamilton, MD 06/27/24 1409  Consultation with critical care.  They are going to go with just the esmolol .  Which is listed as first line choice.  Clevidipine to get her blood pressures down very nicely.  With her and switch her over to Esmolol     Melitza Metheny, MD 06/27/24 1451

## 2024-06-27 NOTE — ED Notes (Signed)
 [facility] arrived

## 2024-06-27 NOTE — ED Notes (Signed)
 [facility] called @ 14:30

## 2024-06-27 NOTE — Consult Note (Signed)
 [facility] [place]  ASSESSMENT / PLAN: 78 y.o. female with uncomplicated type B aortic dissection.  Thankfully, patient is pain-free with fairly good impulse control.  Recommend continued medical therapy including goal heart rate less than 80/min, goal systolic blood pressure less than 120 mmHg.  Recommend repeat CT angiogram of the chest, abdomen, and pelvis 48 hours after admission.  Recommend repeat CT scan if patient has severe chest pain or severe back pain.  No role for intervention at the moment.  CHIEF COMPLAINT: Chest pain  HISTORY OF PRESENT ILLNESS: Jennifer Jacobs is a 78 y.o. female admitted to the critical care service for treatment of type B aortic dissection.  Patient initially presented to [facility] emergency department for evaluation of chest pain.  CT angiogram revealed a uncomplicated type B aortic dissection.  The patient reports a history of brain aneurysm status post treatment.  We reviewed the natural history of aortic dissection, the rationale for medical therapy, and the goal for no surgical therapy unless absolutely necessary.  Past Medical History:  Diagnosis Date   Acute blood loss anemia    AKI (acute kidney injury) 01/11/2018   Alcohol abuse 07/05/2014   Allergy    Anxiety    Anxiety and depression    Arthritis    Coagulation defect 09/19/2023   Cognitive deficit due to recent stroke 11/22/2016   COPD (chronic obstructive pulmonary disease) (HCC)    Depression    Dyslipidemia    Fibromyalgia    Hepatic cyst    Hyperlipidemia    Hypertension    Leukocytosis    Liver cyst 03/18/2014   01/26/2014 ultrasound Hypoechoic structure adjacent to gallbladder right lobe liver,  measuring 29 x 18 x 17 mm, possibly septated cyst     MI (myocardial infarction) (HCC) 2007   By patient report, not substantiated by recent nuclear stress test.   Neuromuscular disorder (HCC)    Seizure disorder (HCC)    none since 2002   Seizure prophylaxis     Seizures (HCC)    Smoker unmotivated to quit    Quit for 3 years and then restarted   Substance abuse (HCC)    ETOH   Vision abnormalities     Past Surgical History:  Procedure Laterality Date   CATARACT EXTRACTION W/PHACO Right 06/30/2018   Procedure: CATARACT EXTRACTION PHACO AND INTRAOCULAR LENS PLACEMENT RIGHT EYE;  Surgeon: Perley Hamilton, MD;  Location: [facility];  Service: Ophthalmology;  Laterality: Right;  CDE: 9.90   CATARACT EXTRACTION W/PHACO Left 07/14/2018   Procedure: CATARACT EXTRACTION PHACO AND INTRAOCULAR LENS PLACEMENT (IOC);  Surgeon: Perley Hamilton, MD;  Location: [facility];  Service: Ophthalmology;  Laterality: Left;  CDE: 6.96   CRANIOTOMY Left 11/19/2016   Procedure: CRANIOTOMY INTRACRANIAL FOR  ANEURYSM CLIPPING;  Surgeon: Morene Hicks Ditty, MD;  Location: [facility] OR;  Service: Neurosurgery;  Laterality: Left;   ELBOW ARTHROPLASTY Left    rods   Exercise tolerance test  04/27/2013   CPET-MET: Submaximal effort (0.96, with goal of greater than 1.09) -- patient states that her effort was limited due to knee pain.  This makes the rest of the interpretation difficult: Peak VO2 - 10.5 (59% moderate), peak 02-pulse -  7..11 (low); heart rate 114 (73% -chronic incompetence considered);; PFTs normal    MOUTH SURGERY     NM MYOVIEW  LTD  05/19/2013   LexiScan : EF 80%, no evidence of ischemia or infarction   TRANSTHORACIC ECHOCARDIOGRAM  03/25/2013   EF 55-60%, no  regional wall motion abnormalities; normal diastolic parameters, normal pulmonary pressures.  No valvular lesions noted.  Essentially normal echo     Family History  Problem Relation Age of Onset   Heart attack Father 30   Hypertension Father    Alcohol abuse Father    Heart attack Maternal Grandmother    Cancer Maternal Grandfather        type unknown   Cancer Paternal Grandmother        type unknown   Depression Mother    Alcohol abuse Mother    Ovarian cancer Paternal Aunt    Cancer Paternal Aunt        ovarian    Prostate cancer Paternal Uncle    Stomach cancer Paternal Aunt    Diabetes Daughter    Heart disease Daughter 76       heart attack   Depression Sister    Alcohol abuse Brother    Depression Sister    Alcohol abuse Sister    Depression Sister    Alcohol abuse Sister    Alcohol abuse Brother    Alcohol abuse Son     Social History   Socioeconomic History   Marital status: Divorced    Spouse name: Not on file   Number of children: 4   Years of education: 9   Highest education level: Not on file  Occupational History   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  Tobacco Use   Smoking status: Every Day    Current packs/day: 0.50    Types: Cigarettes   Smokeless tobacco: Never   Tobacco comments:    one pack every 3 months  Vaping Use   Vaping status: Never Used  Substance and Sexual Activity   Alcohol use: Not Currently    Comment: occasionally   Drug use: Yes    Frequency: 2.0 times per week    Types: Marijuana    Comment: last smoked 1 week ago   Sexual activity: Not Currently    Partners: Male    Birth control/protection: None  Other Topics Concern   Not on file  Social History Narrative   Lives alone with dog   Works as a Pharmacist, Hospital    Does drink about 5 alcoholic beverages a week.            Husband: Jerel - divorced   Daughters: Garen Mink (passed away) , Psychologist, Educational   Social Drivers of Corporate Investment Banker Strain: Not on [employer] Insecurity: Not on file  Transportation Needs: Not on file  Physical Activity: Not on file  Stress: Not on file  Social Connections: Not on file  Intimate Partner Violence: Not on file    Allergies  Allergen Reactions   Aspirin  Nausea Only   Prozac  [Fluoxetine  Hcl] Other (See Comments)    Headaches    Current Facility-Administered Medications  Medication Dose Route Frequency Provider Last Rate Last Admin   Chlorhexidine Gluconate Cloth 2 % PADS 6 each  6 each Topical Daily Hattar,  Zola SAILOR, MD       clevidipine (CLEVIPREX) infusion 0.5 mg/mL  0-21 mg/hr Intravenous Continuous Zackowski, Scott, MD 28 mL/hr at 06/27/24 1524 14 mg/hr at 06/27/24 1524   docusate sodium  (COLACE) capsule 100 mg  100 mg Oral BID PRN Hattar, Laith N, MD       esmolol  (BREVIBLOC ) 2000 mg / 100 mL (20 mg/mL) infusion  25-300 mcg/kg/min Intravenous Continuous Hattar,  Zola SAILOR, MD 13.47 mL/hr at 06/27/24 1455 50 mcg/kg/min at 06/27/24 1455   fentaNYL  (SUBLIMAZE ) injection 25-50 mcg  25-50 mcg Intravenous Q1H PRN Hattar, Zola SAILOR, MD       nicotine  (NICODERM CQ  - dosed in mg/24 hr) patch 7 mg  7 mg Transdermal Daily Gretta Doffing P, DO       nicotine  polacrilex (NICORETTE) gum 2 mg  2 mg Oral PRN Gretta Doffing P, DO       polyethylene glycol (MIRALAX  / GLYCOLAX ) packet 17 g  17 g Oral Daily PRN Zaida Zola SAILOR, MD        PHYSICAL EXAM Vitals:   06/27/24 1452 06/27/24 1455 06/27/24 1456 06/27/24 1459  BP: 104/72 (!) 81/72 105/70 105/70  Pulse: 72 71    Resp: 20 15  16   Temp:      TempSrc:      SpO2: (!) 88% 90%    Weight:      Height:       Elderly woman in no distress Regular rate and rhythm Unlabored breathing 2+ radial pulses bilaterally 2+ dorsalis pedis pulses bilaterally  PERTINENT LABORATORY AND RADIOLOGIC DATA  Most recent CBC    Latest Ref Rng & Units 06/27/2024   11:44 AM 02/01/2023    4:20 PM 02/25/2022   12:40 PM  CBC  WBC 4.0 - 10.5 K/uL 9.1  7.3  7.5   Hemoglobin 12.0 - 15.0 g/dL 85.6  87.5  87.4   Hematocrit 36.0 - 46.0 % 46.0  38.4  38.4   Platelets 150 - 400 K/uL 197  220  248      Most recent CMP    Latest Ref Rng & Units 06/27/2024   11:44 AM 02/01/2023    4:20 PM 02/25/2022   12:40 PM  CMP  Glucose 70 - 99 mg/dL 895  82  895   BUN 8 - 23 mg/dL 8  29  19    Creatinine 0.44 - 1.00 mg/dL 9.06  8.55  8.33   Sodium 135 - 145 mmol/L 142  138  140   Potassium 3.5 - 5.1 mmol/L 3.8  3.6  3.5   Chloride 98 - 111 mmol/L 106  107  101   CO2 22 - 32 mmol/L 28  24  29     Calcium 8.9 - 10.3 mg/dL 9.8  9.1  9.6   Total Protein 6.5 - 8.1 g/dL 7.3  6.6    Total Bilirubin 0.0 - 1.2 mg/dL 0.5  0.5    Alkaline Phos 38 - 126 U/L 96  52    AST 15 - 41 U/L 20  16    ALT 0 - 44 U/L 14  15     CT angiogram of the chest, abdomen, and pelvis personally reviewed. Type B aortic dissection extending from left subclavian artery to celiac axis.  There is an irregularity in the arch of the aorta which will need attention and follow-up.  The aorta is fairly large in the proximal descending aorta as annotated by the radiologist (50 mm).  Debby SAILOR. Magda, MD FACS [facility] Phone Number: [phone] 06/27/2024 4:29 PM   Total time spent on preparing this encounter including chart review, data review, collecting history, examining the patient, and coordinating care: 60 minutes  Portions of this report may have been transcribed using voice recognition software.  Every effort has been made to ensure accuracy; however, inadvertent computerized transcription errors may still be present.

## 2024-06-28 DIAGNOSIS — Z87891 Personal history of nicotine dependence: Secondary | ICD-10-CM

## 2024-06-28 LAB — CBC
HCT: 43.5 % (ref 36.0–46.0)
Hemoglobin: 14.5 g/dL (ref 12.0–15.0)
MCH: 30.9 pg (ref 26.0–34.0)
MCHC: 33.3 g/dL (ref 30.0–36.0)
MCV: 92.8 fL (ref 80.0–100.0)
Platelets: 179 K/uL (ref 150–400)
RBC: 4.69 MIL/uL (ref 3.87–5.11)
RDW: 14.8 % (ref 11.5–15.5)
WBC: 10.2 K/uL (ref 4.0–10.5)
nRBC: 0 % (ref 0.0–0.2)

## 2024-06-28 LAB — BASIC METABOLIC PANEL WITH GFR
Anion gap: 12 (ref 5–15)
BUN: 5 mg/dL — ABNORMAL LOW (ref 8–23)
CO2: 28 mmol/L (ref 22–32)
Calcium: 9.1 mg/dL (ref 8.9–10.3)
Chloride: 99 mmol/L (ref 98–111)
Creatinine, Ser: 0.96 mg/dL (ref 0.44–1.00)
GFR, Estimated: >60 mL/min (ref >60)
Glucose, Bld: 114 mg/dL — ABNORMAL HIGH (ref 70–99)
Potassium: 3.5 mmol/L (ref 3.5–5.1)
Sodium: 139 mmol/L (ref 135–145)

## 2024-06-28 LAB — GLUCOSE, CAPILLARY
Glucose-Capillary: 115 mg/dL — ABNORMAL HIGH (ref 70–99)
Glucose-Capillary: 120 mg/dL — ABNORMAL HIGH (ref 70–99)
Glucose-Capillary: 124 mg/dL — ABNORMAL HIGH (ref 70–99)

## 2024-06-28 LAB — HEMOGLOBIN A1C
Hgb A1c MFr Bld: 5.7 % — ABNORMAL HIGH (ref 4.8–5.6)
Mean Plasma Glucose: 116.89 mg/dL

## 2024-06-28 MED ORDER — CHLORTHALIDONE 25 MG PO TABS
25.0000 mg | ORAL_TABLET | Freq: Every day | ORAL | Status: DC
  Administered 2024-06-28 – 2024-06-30 (×3): 25 mg via ORAL
  Filled 2024-06-28 (×5): qty 1

## 2024-06-28 MED ORDER — AMLODIPINE BESYLATE 10 MG PO TABS
10.0000 mg | ORAL_TABLET | Freq: Every day | ORAL | Status: DC
  Administered 2024-06-28 – 2024-07-03 (×5): 10 mg via ORAL
  Filled 2024-06-28 (×5): qty 1

## 2024-06-28 MED ORDER — INSULIN ASPART 100 UNIT/ML IJ SOLN
1.0000 [IU] | INTRAMUSCULAR | Status: DC
  Administered 2024-06-28: 1 [IU] via SUBCUTANEOUS
  Filled 2024-06-28: qty 1

## 2024-06-28 MED ORDER — CARVEDILOL 6.25 MG PO TABS
6.2500 mg | ORAL_TABLET | Freq: Two times a day (BID) | ORAL | Status: DC
  Administered 2024-06-28: 6.25 mg via ORAL
  Filled 2024-06-28: qty 1

## 2024-06-28 MED ORDER — CARVEDILOL 12.5 MG PO TABS
12.5000 mg | ORAL_TABLET | Freq: Two times a day (BID) | ORAL | Status: DC
  Administered 2024-06-28 – 2024-06-29 (×2): 12.5 mg via ORAL
  Filled 2024-06-28 (×2): qty 1

## 2024-06-28 MED ORDER — CHLORTHALIDONE 25 MG PO TABS
25.0000 mg | ORAL_TABLET | Freq: Every day | ORAL | Status: DC
Start: 2024-06-28 — End: 2024-06-28

## 2024-06-28 NOTE — Progress Notes (Addendum)
  Progress Note    06/28/2024 9:02 AM * No surgery found *  Subjective:  says she has a little bit of chest discomfort but this is much better than when she was first admitted    Vitals:   06/28/24 0800 06/28/24 0815  BP: 126/79 (!) 141/77  Pulse: 71 66  Resp: 16 15  Temp:    SpO2: 95% 98%    Physical Exam: General:  resting comfortably, NAD Cardiac:  regular rate and rhythm, SBPs <120 Lungs:  nonlabored Extremities:  2+ radial and DP pulses bilaterally   CBC    Component Value Date/Time   WBC 10.2 06/28/2024 0208   RBC 4.69 06/28/2024 0208   HGB 14.5 06/28/2024 0208   HCT 43.5 06/28/2024 0208   PLT 179 06/28/2024 0208   MCV 92.8 06/28/2024 0208   MCH 30.9 06/28/2024 0208   MCHC 33.3 06/28/2024 0208   RDW 14.8 06/28/2024 0208   LYMPHSABS 3.7 06/27/2024 1144   MONOABS 1.0 06/27/2024 1144   EOSABS 0.1 06/27/2024 1144   BASOSABS 0.0 06/27/2024 1144    BMET    Component Value Date/Time   NA 139 06/28/2024 0208   K 3.5 06/28/2024 0208   CL 99 06/28/2024 0208   CO2 28 06/28/2024 0208   GLUCOSE 114 (H) 06/28/2024 0208   BUN 5 (L) 06/28/2024 0208   CREATININE 0.96 06/28/2024 0208   CALCIUM 9.1 06/28/2024 0208   GFRNONAA >60 06/28/2024 0208   GFRAA 50 (L) 06/08/2019 0701    INR    Component Value Date/Time   INR 1.00 01/11/2018 0201     Intake/Output Summary (Last 24 hours) at 06/28/2024 0902 Last data filed at 06/28/2024 0830 Gross per 24 hour  Intake 979.14 ml  Output 1650 ml  Net -670.86 ml      Assessment/Plan:  78 y.o. female admitted with TBAD   -She says she is doing alright this morning. She had intermittent chest discomfort yesterday however this has significantly improved since her pain from admission -She is resting comfortably this morning. She has had good impulse control over the past 24H  -Bilateral upper and lower extremities well perfused with palpable radial and DP pulses -Continue impulse control with HR <60 and SBP <120.  Recommend repeat CT scan if she has severe chest or back pain, otherwise we will plan on repeat scan tomorrow to assess for interval changes   Ahmed Holster, PA-C [facility] [phone] 06/28/2024 9:02 AM   VASCULAR STAFF ADDENDUM: I have independently interviewed and examined the patient. I agree with the above.   Debby SAILOR. Magda, MD [facility] Phone Number: [phone] 06/28/2024 9:53 AM

## 2024-06-28 NOTE — H&P (Signed)
 NAME:  Jennifer Jacobs, MRN:  [id number], DOB:  Jan 23, 1946, LOS: 1 ADMISSION DATE:  06/27/2024, CONSULTATION DATE:  11/8 REFERRING MD:  Jadine, CHIEF COMPLAINT:  type B dissection   History of Present Illness:  [name] is a 78 y/o woman with a history of cerebral aneurysms, previous strokes, MI, hypertension who presented with sudden onset chest pain radiating into her back.  She has had pain like this a long time ago, but not recently.  Systolic blood pressure in the 170s at presentation.  In the emergency department she was found to have a type B dissection.  She was started on esmolol  and clevidipine to get her blood pressure down.  She reports compliance with her blood pressure medications.  She uses pill packs and does not think she missed any doses this week.  She took her medications this morning.  No personal or family history of aortic dissection.  At present her pain is resolved.   Pertinent  Medical History  Cerebral aneurysms MI Hypertension Hyperlipidemia Fibromyalgia COPD on home oxygen  Significant Hospital Events: Including procedures, antibiotic start and stop dates in addition to other pertinent events   11 /8 admitted, started cleviprex & esmolol   Interim History / Subjective:  Poor appetite, she hasn't been able to eat much of anything since admission.  No pain today.  Objective    Blood pressure 123/84, pulse 72, temperature 99.6 F (37.6 C), temperature source Oral, resp. rate 15, height 5' 8 (1.727 m), weight 91.6 kg, SpO2 94%.        Intake/Output Summary (Last 24 hours) at 06/28/2024 1251 Last data filed at 06/28/2024 1238 Gross per 24 hour  Intake 1240.31 ml  Output 2200 ml  Net -959.69 ml   Filed Weights   06/27/24 1141 06/27/24 1545  Weight: 89.8 kg 91.6 kg    Examination: General: elderly woman lying in bed in NAD, covered up under blankets HENT: [place]/AT, eyes anicteric Lungs: breathing comfortably on [place] Cardiovascular: S1S2,  RRR Abdomen: soft, NT Extremities: no peripheral edema, no cyanosis Neuro: awake, alert, answering questions appropriately   BUN 5 Creatinine 0.96 WBC 10.2 H/H 14.5./43.5 Platelets 179   Resolved problem list   Assessment and Plan   Type B aortic dissection Hypertensive emergency -impulse control-- goal HR <60 and SBP <120. Con't losartan, adding coreg & amlodipine  today. Waiting on med rec to confirm she was not on additional antihypertensives PTA. Con't esmolol  to maintain HR & BP goals.  -con't to hold PTA apixaban  -oxycodone  and fentanyl  PRN for pain -repeat CT chest tomorrow or sooner if severe chest/ back pain  History of seizures - con't PTA keppra   History of tobacco use -nicotine  replacement therapy- patch & PRN gum  Hyperglycemia, prediabetes A1c 5.7 -SSI PRN added    DVT prophylaxis: SCD   Labs   CBC: Recent Labs  Lab 06/27/24 1144 06/28/24 0208  WBC 9.1 10.2  NEUTROABS 4.3  --   HGB 14.3 14.5  HCT 46.0 43.5  MCV 94.5 92.8  PLT 197 179    Basic Metabolic Panel: Recent Labs  Lab 06/27/24 1144 06/28/24 0208  NA 142 139  K 3.8 3.5  CL 106 99  CO2 28 28  GLUCOSE 104* 114*  BUN 8 5*  CREATININE 0.93 0.96  CALCIUM 9.8 9.1   GFR: Estimated Creatinine Clearance: 60 mL/min (by C-G formula based on SCr of 0.96 mg/dL). Recent Labs  Lab 06/27/24 1144 06/28/24 0208  WBC 9.1 10.2  Critical care time:     This patient is critically ill with multiple organ system failure which requires frequent high complexity decision making, assessment, support, evaluation, and titration of therapies. This was completed through the application of advanced monitoring technologies and extensive interpretation of multiple databases. During this encounter critical care time was devoted to patient care services described in this note for 32 minutes.   Leita SHAUNNA Gaskins, DO 06/28/24 1:24 PM [facility]  For contact information, see Amion.  If no response to pager, please call PCCM consult pager. After hours, 7PM- 7AM, please call Elink.

## 2024-06-29 ENCOUNTER — Inpatient Hospital Stay (HOSPITAL_COMMUNITY)

## 2024-06-29 DIAGNOSIS — F17210 Nicotine dependence, cigarettes, uncomplicated: Secondary | ICD-10-CM

## 2024-06-29 DIAGNOSIS — R739 Hyperglycemia, unspecified: Secondary | ICD-10-CM

## 2024-06-29 LAB — CBC
HCT: 43.9 % (ref 36.0–46.0)
Hemoglobin: 14.6 g/dL (ref 12.0–15.0)
MCH: 30.3 pg (ref 26.0–34.0)
MCHC: 33.3 g/dL (ref 30.0–36.0)
MCV: 91.1 fL (ref 80.0–100.0)
Platelets: 192 K/uL (ref 150–400)
RBC: 4.82 MIL/uL (ref 3.87–5.11)
RDW: 14.6 % (ref 11.5–15.5)
WBC: 11.3 K/uL — ABNORMAL HIGH (ref 4.0–10.5)
nRBC: 0 % (ref 0.0–0.2)

## 2024-06-29 LAB — BASIC METABOLIC PANEL WITH GFR
Anion gap: 9 (ref 5–15)
BUN: 6 mg/dL — ABNORMAL LOW (ref 8–23)
CO2: 23 mmol/L (ref 22–32)
Calcium: 9.3 mg/dL (ref 8.9–10.3)
Chloride: 104 mmol/L (ref 98–111)
Creatinine, Ser: 0.91 mg/dL (ref 0.44–1.00)
GFR, Estimated: >60 mL/min (ref >60)
Glucose, Bld: 112 mg/dL — ABNORMAL HIGH (ref 70–99)
Potassium: 3.5 mmol/L (ref 3.5–5.1)
Sodium: 136 mmol/L (ref 135–145)

## 2024-06-29 LAB — GLUCOSE, CAPILLARY
Glucose-Capillary: 136 mg/dL — ABNORMAL HIGH (ref 70–99)
Glucose-Capillary: 109 mg/dL — ABNORMAL HIGH (ref 70–99)
Glucose-Capillary: 108 mg/dL — ABNORMAL HIGH (ref 70–99)
Glucose-Capillary: 110 mg/dL — ABNORMAL HIGH (ref 70–99)
Glucose-Capillary: 136 mg/dL — ABNORMAL HIGH (ref 70–99)
Glucose-Capillary: 98 mg/dL (ref 70–99)

## 2024-06-29 LAB — TRIGLYCERIDES: Triglycerides: 137 mg/dL (ref <150)

## 2024-06-29 MED ORDER — INSULIN ASPART 100 UNIT/ML IJ SOLN
0.0000 [IU] | Freq: Three times a day (TID) | INTRAMUSCULAR | Status: DC
  Administered 2024-06-29: 2 [IU] via SUBCUTANEOUS
  Administered 2024-06-30 – 2024-07-02 (×2): 1 [IU] via SUBCUTANEOUS
  Filled 2024-06-29: qty 2
  Filled 2024-06-29: qty 1
  Filled 2024-06-29: qty 2
  Filled 2024-06-29: qty 1

## 2024-06-29 MED ORDER — ACETAMINOPHEN 325 MG PO TABS
650.0000 mg | ORAL_TABLET | Freq: Four times a day (QID) | ORAL | Status: DC | PRN
  Administered 2024-06-29 – 2024-07-02 (×3): 650 mg via ORAL
  Filled 2024-06-29 (×3): qty 2

## 2024-06-29 MED ORDER — POTASSIUM CHLORIDE CRYS ER 20 MEQ PO TBCR
40.0000 meq | EXTENDED_RELEASE_TABLET | Freq: Once | ORAL | Status: AC
  Administered 2024-06-29: 40 meq via ORAL
  Filled 2024-06-29: qty 2

## 2024-06-29 MED ORDER — CARVEDILOL 25 MG PO TABS
25.0000 mg | ORAL_TABLET | Freq: Two times a day (BID) | ORAL | Status: DC
  Administered 2024-06-29 – 2024-07-03 (×6): 25 mg via ORAL
  Filled 2024-06-29 (×5): qty 1
  Filled 2024-06-29: qty 2
  Filled 2024-06-29: qty 1

## 2024-06-29 MED ORDER — CARVEDILOL 12.5 MG PO TABS
12.5000 mg | ORAL_TABLET | Freq: Once | ORAL | Status: AC
  Administered 2024-06-29: 12.5 mg via ORAL
  Filled 2024-06-29: qty 1

## 2024-06-29 MED ORDER — IRBESARTAN 300 MG PO TABS
300.0000 mg | ORAL_TABLET | Freq: Every day | ORAL | Status: DC
  Administered 2024-06-29: 300 mg via ORAL
  Filled 2024-06-29 (×2): qty 1

## 2024-06-29 MED ORDER — IOHEXOL 350 MG/ML SOLN
100.0000 mL | Freq: Once | INTRAVENOUS | Status: AC | PRN
  Administered 2024-06-29: 100 mL via INTRAVENOUS

## 2024-06-29 NOTE — Progress Notes (Addendum)
  Progress Note    06/29/2024 8:10 AM  Subjective:  no complaints.  No recurrent back/chest pain   Vitals:   06/29/24 0737 06/29/24 0802  BP:    Pulse:    Resp:    Temp: 98.9 F (37.2 C)   SpO2:  94%   Physical Exam: Lungs:  non labored Extremities:  moving all ext well; palpable radial and DP pulses Abdomen:  soft Neurologic: A&O  CBC    Component Value Date/Time   WBC 11.3 (H) 06/29/2024 0217   RBC 4.82 06/29/2024 0217   HGB 14.6 06/29/2024 0217   HCT 43.9 06/29/2024 0217   PLT 192 06/29/2024 0217   MCV 91.1 06/29/2024 0217   MCH 30.3 06/29/2024 0217   MCHC 33.3 06/29/2024 0217   RDW 14.6 06/29/2024 0217   LYMPHSABS 3.7 06/27/2024 1144   MONOABS 1.0 06/27/2024 1144   EOSABS 0.1 06/27/2024 1144   BASOSABS 0.0 06/27/2024 1144    BMET    Component Value Date/Time   NA 136 06/29/2024 0217   K 3.5 06/29/2024 0217   CL 104 06/29/2024 0217   CO2 23 06/29/2024 0217   GLUCOSE 112 (H) 06/29/2024 0217   BUN 6 (L) 06/29/2024 0217   CREATININE 0.91 06/29/2024 0217   CALCIUM 9.3 06/29/2024 0217   GFRNONAA >60 06/29/2024 0217   GFRAA 50 (L) 06/08/2019 0701    INR    Component Value Date/Time   INR 1.00 01/11/2018 0201     Intake/Output Summary (Last 24 hours) at 06/29/2024 0810 Last data filed at 06/29/2024 0700 Gross per 24 hour  Intake 2181.62 ml  Output 3550 ml  Net -1368.38 ml     Assessment/Plan:  78 y.o. female is s/p TBAD * No surgery found *   Subjectively feeling well; has not had chest/back pain since admission No signs or symptoms of malperfusion on exam Continue strict impulse control Repeat CTA c/a/p today   Donnice Sender, PA-C [facility] [phone] 06/29/2024 8:10 AM  VASCULAR STAFF ADDENDUM: I have independently interviewed and examined the patient. I agree with the above.  Repeat CT angiogram shows no change in TBAD. Recommend increasing PO regimen to achieve impulse control. Will follow less  frequently.  Debby SAILOR. Magda, MD [facility] Phone Number: [phone] 06/29/2024 4:03 PM

## 2024-06-29 NOTE — Progress Notes (Signed)
 NAME:  Jennifer Jacobs, MRN:  [id number], DOB:  26-Jun-1946, LOS: 2 ADMISSION DATE:  06/27/2024, CONSULTATION DATE:  11/8 REFERRING MD:  Jadine, CHIEF COMPLAINT:  type B dissection   History of Present Illness:  Jennifer Jacobs is a 78 y/o woman with a history of cerebral aneurysms, previous strokes, MI, hypertension who presented with sudden onset chest pain radiating into her back.  She has had pain like this a long time ago, but not recently.  Systolic blood pressure in the 170s at presentation.  In the emergency department she was found to have a type B dissection.  She was started on esmolol  and clevidipine to get her blood pressure down.  She reports compliance with her blood pressure medications.  She uses pill packs and does not think she missed any doses this week.  She took her medications this morning.  No personal or family history of aortic dissection.  At present her pain is resolved.   Pertinent  Medical History  Cerebral aneurysms MI Hypertension Hyperlipidemia Fibromyalgia COPD on home oxygen  Significant Hospital Events: Including procedures, antibiotic start and stop dates in addition to other pertinent events   11 /8 admitted, started cleviprex & esmolol  11/10 for repeat CTA   Interim History / Subjective:  NAEO   NIBP readings are variable depending on pt position   Didn't eat much breakfast   Objective    Blood pressure 121/86, pulse 72, temperature 98.9 F (37.2 C), temperature source Oral, resp. rate 18, height 5' 8 (1.727 m), weight 92.6 kg, SpO2 94%.        Intake/Output Summary (Last 24 hours) at 06/29/2024 1039 Last data filed at 06/29/2024 0700 Gross per 24 hour  Intake 2093.24 ml  Output 3050 ml  Net -956.76 ml   Filed Weights   06/27/24 1141 06/27/24 1545 06/29/24 0500  Weight: 89.8 kg 91.6 kg 92.6 kg    Examination: General: chronically ill wdwn elderly F NAD  HENT: NCAT pink mm anicteric sclera  Lungs: CTAb  Cardiovascular: rrr  s1s2 Abdomen: soft ndnt + bowel sounds  Extremities: no acute joint deformity no pitting edema  Neuro: AAOx4. No focal deficits. 5/5 strength. No sensory deficits     Resolved problem list   Assessment and Plan   Type B dissection HTN emergency, improved P -esmolol  and clevi gtt for goal HR N< 60 SBP < 120  -holding home AC -for repeat CTA 11/10 -- if stable would favor escalating PO agents for BP mgmnt  -PRN analgesia   Hx sz - keppra   Prediabetes w hyperglycemia -SSI   Tobacco use disorder -nicotine  replacement -cessation support  -anoro ellipta   Labs   CBC: Recent Labs  Lab 06/27/24 1144 06/28/24 0208 06/29/24 0217  WBC 9.1 10.2 11.3*  NEUTROABS 4.3  --   --   HGB 14.3 14.5 14.6  HCT 46.0 43.5 43.9  MCV 94.5 92.8 91.1  PLT 197 179 192    Basic Metabolic Panel: Recent Labs  Lab 06/27/24 1144 06/28/24 0208 06/29/24 0217  NA 142 139 136  K 3.8 3.5 3.5  CL 106 99 104  CO2 28 28 23   GLUCOSE 104* 114* 112*  BUN 8 5* 6*  CREATININE 0.93 0.96 0.91  CALCIUM 9.8 9.1 9.3   GFR: Estimated Creatinine Clearance: 63.6 mL/min (by C-G formula based on SCr of 0.91 mg/dL). Recent Labs  Lab 06/27/24 1144 06/28/24 0208 06/29/24 0217  WBC 9.1 10.2 11.3*     CRITICAL CARE Performed  by: Jennifer Jacobs Gave   Total critical care time: 36 minutes  Critical care time was exclusive of separately billable procedures and treating other patients. Critical care was necessary to treat or prevent imminent or life-threatening deterioration.  Critical care was time spent personally by me on the following activities: development of treatment plan with patient and/or surrogate as well as nursing, discussions with consultants, evaluation of patient's response to treatment, examination of patient, obtaining history from patient or surrogate, ordering and performing treatments and interventions, ordering and review of laboratory studies, ordering and review of radiographic  studies, pulse oximetry and re-evaluation of patient's condition.  Jennifer Gave MSN, AGACNP-BC [place] Amion for pager  06/29/2024, 10:39 AM

## 2024-06-29 NOTE — Progress Notes (Signed)
 [facility] ADULT ICU REPLACEMENT PROTOCOL   The patient does apply for the [facility] Adult ICU Electrolyte Replacment Protocol based on the criteria listed below:   1.Exclusion criteria: TCTS, ECMO, Dialysis, and Myasthenia Gravis patients 2. Is GFR >/= 30 ml/min? Yes.    Patient's GFR today is >60 3. Is SCr </= 2? Yes.   Patient's SCr is 0.91 mg/dL 4. Did SCr increase >/= 0.5 in 24 hours? No. 5.Pt's weight >40kg  Yes.   6. Abnormal electrolyte(s): Potassium  7. Electrolytes replaced per protocol 8.  Call MD STAT for K+ </= 2.5, Phos </= 1, or Mag </= 1 Physician:  Dr. Kassie Medico A Pat Elicker 06/29/2024 4:16 AM

## 2024-06-30 ENCOUNTER — Other Ambulatory Visit (HOSPITAL_COMMUNITY): Payer: Self-pay

## 2024-06-30 ENCOUNTER — Telehealth (HOSPITAL_COMMUNITY): Payer: Self-pay

## 2024-06-30 ENCOUNTER — Inpatient Hospital Stay (HOSPITAL_COMMUNITY)

## 2024-06-30 DIAGNOSIS — I161 Hypertensive emergency: Secondary | ICD-10-CM | POA: Diagnosis not present

## 2024-06-30 LAB — CBC
HCT: 46.5 % — ABNORMAL HIGH (ref 36.0–46.0)
Hemoglobin: 15.6 g/dL — ABNORMAL HIGH (ref 12.0–15.0)
MCH: 30.8 pg (ref 26.0–34.0)
MCHC: 33.5 g/dL (ref 30.0–36.0)
MCV: 91.7 fL (ref 80.0–100.0)
Platelets: 185 K/uL (ref 150–400)
RBC: 5.07 MIL/uL (ref 3.87–5.11)
RDW: 14.7 % (ref 11.5–15.5)
WBC: 10.7 K/uL — ABNORMAL HIGH (ref 4.0–10.5)
nRBC: 0 % (ref 0.0–0.2)

## 2024-06-30 LAB — BASIC METABOLIC PANEL WITH GFR
Anion gap: 10 (ref 5–15)
BUN: 10 mg/dL (ref 8–23)
CO2: 21 mmol/L — ABNORMAL LOW (ref 22–32)
Calcium: 9.6 mg/dL (ref 8.9–10.3)
Chloride: 106 mmol/L (ref 98–111)
Creatinine, Ser: 0.96 mg/dL (ref 0.44–1.00)
GFR, Estimated: >60 mL/min (ref >60)
Glucose, Bld: 104 mg/dL — ABNORMAL HIGH (ref 70–99)
Potassium: 3.8 mmol/L (ref 3.5–5.1)
Sodium: 137 mmol/L (ref 135–145)

## 2024-06-30 LAB — ECHOCARDIOGRAM COMPLETE
Area-P 1/2: 3.31 cm2
Height: 68 in
S' Lateral: 2.7 cm
Weight: 3086.44 [oz_av]

## 2024-06-30 LAB — GLUCOSE, CAPILLARY
Glucose-Capillary: 124 mg/dL — ABNORMAL HIGH (ref 70–99)
Glucose-Capillary: 130 mg/dL — ABNORMAL HIGH (ref 70–99)
Glucose-Capillary: 120 mg/dL — ABNORMAL HIGH (ref 70–99)
Glucose-Capillary: 112 mg/dL — ABNORMAL HIGH (ref 70–99)
Glucose-Capillary: 120 mg/dL — ABNORMAL HIGH (ref 70–99)

## 2024-06-30 LAB — TRIGLYCERIDES: Triglycerides: 132 mg/dL (ref <150)

## 2024-06-30 MED ORDER — GABAPENTIN 400 MG PO CAPS
800.0000 mg | ORAL_CAPSULE | Freq: Three times a day (TID) | ORAL | Status: DC
  Administered 2024-06-30 – 2024-07-03 (×10): 800 mg via ORAL
  Filled 2024-06-30 (×10): qty 2

## 2024-06-30 MED ORDER — MIRABEGRON ER 25 MG PO TB24
25.0000 mg | ORAL_TABLET | Freq: Every day | ORAL | Status: DC
  Administered 2024-06-30 – 2024-07-03 (×4): 25 mg via ORAL
  Filled 2024-06-30 (×4): qty 1

## 2024-06-30 MED ORDER — RISPERIDONE 0.5 MG PO TABS
0.5000 mg | ORAL_TABLET | Freq: Every day | ORAL | Status: DC
  Administered 2024-06-30 – 2024-07-02 (×3): 0.5 mg via ORAL
  Filled 2024-06-30 (×5): qty 1

## 2024-06-30 MED ORDER — METHOCARBAMOL 500 MG PO TABS: 500.0000 mg | ORAL_TABLET | Freq: Two times a day (BID) | ORAL | Status: DC | PRN

## 2024-06-30 MED ORDER — TRAZODONE HCL 50 MG PO TABS
50.0000 mg | ORAL_TABLET | Freq: Every day | ORAL | Status: DC
  Administered 2024-06-30 – 2024-07-02 (×3): 50 mg via ORAL
  Filled 2024-06-30 (×3): qty 1

## 2024-06-30 MED ORDER — SACUBITRIL-VALSARTAN 49-51 MG PO TABS
1.0000 | ORAL_TABLET | Freq: Two times a day (BID) | ORAL | Status: DC
  Filled 2024-06-30 (×2): qty 1

## 2024-06-30 MED ORDER — LACTULOSE 10 GM/15ML PO SOLN
20.0000 g | Freq: Once | ORAL | Status: AC
  Administered 2024-06-30: 20 g via ORAL
  Filled 2024-06-30: qty 30

## 2024-06-30 MED ORDER — POLYETHYLENE GLYCOL 3350 17 G PO PACK
17.0000 g | PACK | Freq: Every day | ORAL | Status: DC
  Administered 2024-06-30 – 2024-07-03 (×4): 17 g via ORAL
  Filled 2024-06-30 (×4): qty 1

## 2024-06-30 MED ORDER — ESCITALOPRAM OXALATE 10 MG PO TABS
20.0000 mg | ORAL_TABLET | Freq: Every day | ORAL | Status: DC
  Administered 2024-06-30 – 2024-07-03 (×4): 20 mg via ORAL
  Filled 2024-06-30 (×4): qty 2

## 2024-06-30 MED ORDER — HYDRALAZINE HCL 50 MG PO TABS
50.0000 mg | ORAL_TABLET | Freq: Three times a day (TID) | ORAL | Status: DC
  Administered 2024-06-30 (×3): 50 mg via ORAL
  Filled 2024-06-30 (×3): qty 1

## 2024-06-30 MED ORDER — SACUBITRIL-VALSARTAN 49-51 MG PO TABS
1.0000 | ORAL_TABLET | Freq: Two times a day (BID) | ORAL | Status: DC
  Filled 2024-06-30: qty 1

## 2024-06-30 MED ORDER — SENNA 8.6 MG PO TABS
1.0000 | ORAL_TABLET | Freq: Every day | ORAL | Status: DC
  Administered 2024-06-30 – 2024-07-03 (×4): 8.6 mg via ORAL
  Filled 2024-06-30 (×4): qty 1

## 2024-06-30 NOTE — Telephone Encounter (Signed)
 Pharmacy Patient Advocate Encounter  Insurance verification completed.    The patient is insured through [facility]. Patient has Medicare and is not eligible for a copay card, but may be able to apply for patient assistance or Medicare RX Payment Plan (Patient Must reach out to their plan, if eligible for payment plan), if available.    Ran test claim for Entresto 24-26mg  and the current 30 day co-pay is $0.   This test claim was processed through [employer]- copay amounts may vary at other pharmacies due to [employer], or as the patient moves through the different stages of their insurance plan.

## 2024-06-30 NOTE — TOC Initial Note (Signed)
 Transition of Care [facility]) - Initial/Assessment Note    Patient Details  Name: Jennifer Jacobs MRN: [id number] Date of Birth: [birth year]  [facility]) CM/SW Contact:    Sudie Erminio Deems, RN Phone Number: 06/30/2024, 3:34 PM  Clinical Narrative: Patient presented for sudden onset of chest pain. PTA patient was from home alone. Patient has support of a caregiver 3 hours a day six days a week. Patient states her son and grandchildren visit often. Patient has DME rollator and oxygen via [employer] 2 liters. Patient's PCP is at [facility] and they provide transportation to appointments. Patient states her aide does the grocery shopping and prepares meals. No home needs identified at this time. ICM will continue to follow for additional needs as the patient progresses.                  Expected Discharge Plan: Home/Self Care Barriers to Discharge: Continued Medical Work up   Patient Goals and CMS Choice Patient states their goals for this hospitalization and ongoing recovery are:: Plans to return home once stable.          Expected Discharge Plan and Services In-house Referral: NA Discharge Planning Services: CM Consult Post Acute Care Choice: NA Living arrangements for the past 2 months: Apartment                   DME Agency: NA       HH Arranged: NA          Prior Living Arrangements/Services Living arrangements for the past 2 months: Apartment Lives with:: Self Patient language and need for interpreter reviewed:: Yes Do you feel safe going back to the place where you live?: Yes      Need for Family Participation in Patient Care: Yes (Comment) Care giver support system in place?: Yes (comment) Current home services: DME, Homehealth aide (rollator and oxygen via [employer] 2 liters.) Criminal Activity/Legal Involvement Pertinent to Current Situation/Hospitalization: No - Comment as needed  Activities of Daily Living   ADL Screening (condition at  time of admission) Independently performs ADLs?: Yes (appropriate for developmental age) Is the patient deaf or have difficulty hearing?: No Does the patient have difficulty seeing, even when wearing glasses/contacts?: No Does the patient have difficulty concentrating, remembering, or making decisions?: No  Permission Sought/Granted Permission sought to share information with : Family Supports, Case Manager                Emotional Assessment Appearance:: Appears stated age Attitude/Demeanor/Rapport: Engaged Affect (typically observed): Appropriate Orientation: : Oriented to Self, Oriented to Place, Oriented to  Time, Oriented to Situation Alcohol / Substance Use: Not Applicable Psych Involvement: No (comment)  Admission diagnosis:  Precordial pain [R07.2] Aortic dissection (HCC) [I71.00] Acute dissection of thoracic aorta (HCC) [I71.019] Aortic dissection distal to left subclavian (HCC) [I71.019] Patient Active Problem List   Diagnosis Date Noted   Aortic dissection distal to left subclavian (HCC) 06/27/2024   Acute dissection of thoracic aorta (HCC) 06/27/2024   Coagulation defect 09/19/2023   Acute pulmonary embolism (HCC) 01/13/2018   Chest pain 01/11/2018   Elevated troponin 01/11/2018   AKI (acute kidney injury) 01/11/2018   NSTEMI (non-ST elevated myocardial infarction) (HCC) 01/11/2018   Vascular headache    Seizure prophylaxis    Weakness of left third cranial nerve    Gait disturbance, post-stroke 11/22/2016   Benign essential HTN    Seizure disorder (HCC)    Acute blood loss anemia  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   Bilateral leg pain 06/26/2016   AAA (abdominal aortic aneurysm) without rupture 06/26/2016   Insomnia 06/07/2015   Urinary incontinence 06/07/2015   Chronic pain syndrome 03/04/2015   Fibromyalgia 03/04/2015   Cervical radiculitis 03/04/2015   Lumbar radicular pain 03/04/2015   Major depression 07/05/2014    Claudication, class I 03/18/2013   Smoker unmotivated to quit    PCP:  Nguyen, Kim, NP Pharmacy:   [facility] - [address] ([place]), [place] - [address] [place] ([place]) [place] [address] Phone: [phone] Fax: [phone]  [facility] - [place], [place] - [address] [place] [address] Phone: [phone] Fax: [phone]     Social Drivers of Health (SDOH) Social History: SDOH Screenings   Food Insecurity: No Food Insecurity (06/27/2024)  Housing: Low Risk  (06/27/2024)  Transportation Needs: No Transportation Needs (06/27/2024)  Utilities: Not At Risk (06/27/2024)  Social Connections: Moderately Integrated (06/27/2024)  Tobacco Use: High Risk (06/27/2024)   SDOH Interventions:     Readmission Risk Interventions     No data to display

## 2024-06-30 NOTE — Progress Notes (Signed)
 NAME:  Jennifer Jacobs, MRN:  [id number], DOB:  1946-01-20, LOS: 3 ADMISSION DATE:  06/27/2024, CONSULTATION DATE:  11/8 REFERRING MD:  Jadine, CHIEF COMPLAINT:  type B dissection   History of Present Illness:  [name] with a history of cerebral aneurysms, previous strokes, MI, hypertension who presented with sudden onset chest pain radiating into her back.  She has had pain like this a long time ago, but not recently.  Systolic blood pressure in the 170s at presentation.  In the emergency department she was found to have a type B dissection.  She was started on esmolol  and clevidipine to get her blood pressure down.  She reports compliance with her blood pressure medications.  She uses pill packs and does not think she missed any doses this week.  She took her medications this morning.  No personal or family history of aortic dissection.  At present her pain is resolved.   Pertinent  Medical History  Cerebral aneurysms MI Hypertension Hyperlipidemia Fibromyalgia COPD on home oxygen  Significant Hospital Events: Including procedures, antibiotic start and stop dates in addition to other pertinent events   11 /8 admitted, started cleviprex & esmolol  11/10 for repeat CTA  11/11 CTA stable, remains on Cleviprex and esmolol    Interim History / Subjective:  No acute events overnight  No chest pain or headache  Remains on Cleviprex and esmolol   Feels constipated    Objective    Blood pressure 133/85, pulse 71, temperature 98.4 F (36.9 C), temperature source Oral, resp. rate 20, height 5' 8 (1.727 m), weight 87.5 kg, SpO2 99%.        Intake/Output Summary (Last 24 hours) at 06/30/2024 0945 Last data filed at 06/30/2024 9343 Gross per 24 hour  Intake 1368.96 ml  Output 1600 ml  Net -231.04 ml   Filed Weights   06/27/24 1545 06/29/24 0500 06/30/24 0545  Weight: 91.6 kg 92.6 kg 87.5 kg    General:  well nourished F resting in bed in NAD  HEENT: MM  pink/moist, sclera anicteric  Neuro: alert and oriented  CV: s1s2 rrr, no m/r/g PULM:  clear bilaterally on RA  GI: soft, mildly distended, non-tender to palpation   Extremities: warm and well perfused with palpable pulses    Labs: Glu 130,  Bicarb 21 K 3.8   UOP 1600   Resolved problem list   Assessment and Plan   Type B dissection HTN emergency, improved Repeat CTA  -esmolol  and clevi gtt for goal HR N< 60 SBP < 120  -holding home AC -for repeat CTA 11/10 -- if stable would favor escalating PO agents for BP mgmnt  -PRN analgesia  -obtain echo  -now on Norvasc , Coreg, Imdur, chlorthalidone, as she remains on IV infusions add Entresto and stop Imdur  Hx sz - keppra   Prediabetes w hyperglycemia -SSI   Tobacco use disorder -nicotine  replacement -cessation support  -anoro ellipta   History of bilateral pulmonary emboli  -2019  -holding anticoagulation   Constipation  -schedule miralax , continue senna and colace   Labs   CBC: Recent Labs  Lab 06/27/24 1144 06/28/24 0208 06/29/24 0217 06/30/24 0214  WBC 9.1 10.2 11.3* 10.7*  NEUTROABS 4.3  --   --   --   HGB 14.3 14.5 14.6 15.6*  HCT 46.0 43.5 43.9 46.5*  MCV 94.5 92.8 91.1 91.7  PLT 197 179 192 185    Basic Metabolic Panel: Recent Labs  Lab 06/27/24 1144 06/28/24  9791 06/29/24 0217 06/30/24 0214  NA 142 139 136 137  K 3.8 3.5 3.5 3.8  CL 106 99 104 106  CO2 28 28 23  21*  GLUCOSE 104* 114* 112* 104*  BUN 8 5* 6* 10  CREATININE 0.93 0.96 0.91 0.96  CALCIUM 9.8 9.1 9.3 9.6   GFR: Estimated Creatinine Clearance: 58.6 mL/min (by C-G formula based on SCr of 0.96 mg/dL). Recent Labs  Lab 06/27/24 1144 06/28/24 0208 06/29/24 0217 06/30/24 0214  WBC 9.1 10.2 11.3* 10.7*     CRITICAL CARE Performed by: Leita SAUNDERS Ralston Venus   Total critical care time: 35 minutes  Critical care time was exclusive of separately billable procedures and treating other patients. Critical care was necessary to  treat or prevent imminent or life-threatening deterioration.  Critical care was time spent personally by me on the following activities: development of treatment plan with patient and/or surrogate as well as nursing, discussions with consultants, evaluation of patient's response to treatment, examination of patient, obtaining history from patient or surrogate, ordering and performing treatments and interventions, ordering and review of laboratory studies, ordering and review of radiographic studies, pulse oximetry and re-evaluation of patient's condition.  Leita SAUNDERS Floride Hutmacher, PA-C [place] [facility] See Amion for pager If no response to pager , please call [phone] until 7pm After 7:00 pm call Elink  [phone]

## 2024-06-30 NOTE — Progress Notes (Signed)
 Echocardiogram 2D Echocardiogram has been performed.  Jennifer Jacobs 06/30/2024, 11:04 AM

## 2024-06-30 NOTE — Progress Notes (Signed)
   06/30/24 1617  Spiritual Encounters  Type of Visit Initial  Care provided to: Patient  Reason for visit Advance directives  OnCall Visit No  Spiritual Framework  Presenting Themes Impactful experiences and emotions  Patient Stress Factors Health changes  Family Stress Factors Health changes  Interventions  Spiritual Care Interventions Made Compassionate presence;Established relationship of care and support  Intervention Outcomes  Outcomes Awareness of support;Awareness of health   Chaplain met with Pt and assisted in completing the Advance Directive. The document is fully filled out and ready for notarization. Chaplain provide guidance on next steps for notarization and ensured the Pt understood the process.  Chaplain will follow up tomorrow to finalize the AD and continue to offer spiriaul and emotional support as needed.

## 2024-07-01 DIAGNOSIS — I161 Hypertensive emergency: Secondary | ICD-10-CM

## 2024-07-01 LAB — GLUCOSE, CAPILLARY
Glucose-Capillary: 111 mg/dL — ABNORMAL HIGH (ref 70–99)
Glucose-Capillary: 103 mg/dL — ABNORMAL HIGH (ref 70–99)
Glucose-Capillary: 117 mg/dL — ABNORMAL HIGH (ref 70–99)
Glucose-Capillary: 120 mg/dL — ABNORMAL HIGH (ref 70–99)

## 2024-07-01 LAB — CBC
HCT: 41.7 % (ref 36.0–46.0)
Hemoglobin: 14.2 g/dL (ref 12.0–15.0)
MCH: 30.9 pg (ref 26.0–34.0)
MCHC: 34.1 g/dL (ref 30.0–36.0)
MCV: 90.8 fL (ref 80.0–100.0)
Platelets: 170 K/uL (ref 150–400)
RBC: 4.59 MIL/uL (ref 3.87–5.11)
RDW: 14.8 % (ref 11.5–15.5)
WBC: 11.9 K/uL — ABNORMAL HIGH (ref 4.0–10.5)
nRBC: 0 % (ref 0.0–0.2)

## 2024-07-01 LAB — BASIC METABOLIC PANEL WITH GFR
Anion gap: 12 (ref 5–15)
BUN: 14 mg/dL (ref 8–23)
CO2: 25 mmol/L (ref 22–32)
Calcium: 9.4 mg/dL (ref 8.9–10.3)
Chloride: 100 mmol/L (ref 98–111)
Creatinine, Ser: 1.43 mg/dL — ABNORMAL HIGH (ref 0.44–1.00)
GFR, Estimated: 38 mL/min — ABNORMAL LOW (ref >60)
Glucose, Bld: 105 mg/dL — ABNORMAL HIGH (ref 70–99)
Potassium: 3.8 mmol/L (ref 3.5–5.1)
Sodium: 137 mmol/L (ref 135–145)

## 2024-07-01 LAB — MAGNESIUM: Magnesium: 2 mg/dL (ref 1.7–2.4)

## 2024-07-01 LAB — TRIGLYCERIDES: Triglycerides: 109 mg/dL (ref <150)

## 2024-07-01 MED ORDER — LACTULOSE 10 GM/15ML PO SOLN
10.0000 g | Freq: Three times a day (TID) | ORAL | Status: AC
  Administered 2024-07-01 (×3): 10 g via ORAL
  Filled 2024-07-01 (×3): qty 15

## 2024-07-01 MED ORDER — IRBESARTAN 300 MG PO TABS
300.0000 mg | ORAL_TABLET | Freq: Every day | ORAL | Status: DC
  Administered 2024-07-01 – 2024-07-02 (×2): 300 mg via ORAL
  Filled 2024-07-01 (×3): qty 1

## 2024-07-01 MED ORDER — LACTATED RINGERS IV BOLUS
500.0000 mL | Freq: Once | INTRAVENOUS | Status: AC
  Administered 2024-07-01: 500 mL via INTRAVENOUS

## 2024-07-01 MED ORDER — IRBESARTAN 300 MG PO TABS
300.0000 mg | ORAL_TABLET | Freq: Every day | ORAL | Status: DC
  Filled 2024-07-01: qty 1

## 2024-07-01 NOTE — Plan of Care (Signed)
  Problem: Clinical Measurements: Goal: Will remain free from infection Outcome: Progressing Goal: Respiratory complications will improve Outcome: Progressing   Problem: Activity: Goal: Risk for activity intolerance will decrease Outcome: Progressing   Problem: Safety: Goal: Ability to remain free from injury will improve Outcome: Progressing   

## 2024-07-01 NOTE — Progress Notes (Signed)
  Progress Note    07/01/2024 9:06 AM * No surgery found *  Subjective:  no complaints   Vitals:   07/01/24 0836 07/01/24 0856  BP: (!) 91/58   Pulse: 70   Resp: (!) 21   Temp:  99.2 F (37.3 C)  SpO2: 97%    Physical Exam: Lungs:  non labored Extremities:  palpable and symmetrical radial and DP pulses Abdomen:  soft, NT, ND Neurologic: A&O  CBC    Component Value Date/Time   WBC 11.9 (H) 07/01/2024 0234   RBC 4.59 07/01/2024 0234   HGB 14.2 07/01/2024 0234   HCT 41.7 07/01/2024 0234   PLT 170 07/01/2024 0234   MCV 90.8 07/01/2024 0234   MCH 30.9 07/01/2024 0234   MCHC 34.1 07/01/2024 0234   RDW 14.8 07/01/2024 0234   LYMPHSABS 3.7 06/27/2024 1144   MONOABS 1.0 06/27/2024 1144   EOSABS 0.1 06/27/2024 1144   BASOSABS 0.0 06/27/2024 1144    BMET    Component Value Date/Time   NA 137 07/01/2024 0234   K 3.8 07/01/2024 0234   CL 100 07/01/2024 0234   CO2 25 07/01/2024 0234   GLUCOSE 105 (H) 07/01/2024 0234   BUN 14 07/01/2024 0234   CREATININE 1.43 (H) 07/01/2024 0234   CALCIUM 9.4 07/01/2024 0234   GFRNONAA 38 (L) 07/01/2024 0234   GFRAA 50 (L) 06/08/2019 0701    INR    Component Value Date/Time   INR 1.00 01/11/2018 0201     Intake/Output Summary (Last 24 hours) at 07/01/2024 9093 Last data filed at 07/01/2024 0800 Gross per 24 hour  Intake 1092.71 ml  Output 550 ml  Net 542.71 ml     Assessment/Plan:  78 y.o. female with TBAD * No surgery found *   Continues to feel well; dull back pain but not nearly as bad as when she was admitted Continue impulse control; transition from IV to p.o regimen Ok to work with therapy teams Office will arrange repeat CTA c/a/p in 1 month   Donnice Sender, [place] [facility] [phone] 07/01/2024 9:06 AM

## 2024-07-01 NOTE — Progress Notes (Signed)
 NAME:  Jennifer Jacobs, MRN:  [id number], DOB:  April 29, 1946, LOS: 4 ADMISSION DATE:  06/27/2024, CONSULTATION DATE:  11/8 REFERRING MD:  Jadine, CHIEF COMPLAINT:  type B dissection   History of Present Illness:  [name] is a 78 y/o woman with a history of cerebral aneurysms, previous strokes, MI, hypertension who presented with sudden onset chest pain radiating into her back.  She has had pain like this a long time ago, but not recently.  Systolic blood pressure in the 170s at presentation.  In the emergency department she was found to have a type B dissection.  She was started on esmolol  and clevidipine to get her blood pressure down.  She reports compliance with her blood pressure medications.  She uses pill packs and does not think she missed any doses this week.  She took her medications this morning.  No personal or family history of aortic dissection.  At present her pain is resolved.   Pertinent  Medical History  Cerebral aneurysms MI Hypertension Hyperlipidemia Fibromyalgia COPD on home oxygen  Significant Hospital Events: Including procedures, antibiotic start and stop dates in addition to other pertinent events   11 /8 admitted, started cleviprex & esmolol  11/10 for repeat CTA  11/11 CTA stable, remains on Cleviprex and esmolol   11/12 off clev and esmolol  since yesterday, SBP on the low side   Interim History / Subjective:  No events overnight, has remained off esmolol  and cleviprex, got hydralizine early in the day yesterday and responded, Entresto never started EF 55-60 % Creatinine bump today with reduced UOP, net negative    Objective    Blood pressure (!) 91/58, pulse 70, temperature 99.2 F (37.3 C), temperature source Oral, resp. rate (!) 21, height 5' 8 (1.727 m), weight 88.5 kg, SpO2 97%.        Intake/Output Summary (Last 24 hours) at 07/01/2024 1105 Last data filed at 07/01/2024 0800 Gross per 24 hour  Intake 819.88 ml  Output 550 ml  Net  269.88 ml   Filed Weights   06/29/24 0500 06/30/24 0545 07/01/24 0530  Weight: 92.6 kg 87.5 kg 88.5 kg    General:  well nourished F resting in bed in NAD  HEENT: MM pink/moist, sclera anicteric  Neuro: alert and oriented  CV: s1s2 rrr, no m/r/g PULM:  clear bilaterally on RA  GI: soft, mildly distended, non-tender to palpation   Extremities: warm and well perfused with palpable pulses    Labs: K 3.8 Creatinine 1.43 up from 0.96 WBC 11.9  UOP 550cc last 24 hrs   Resolved problem list   Assessment and Plan   Type B dissection HTN emergency, improved Repeat CTA  -esmolol  and clevi gtt off yesterday afternoon -goal HR N< 60 SBP < 120  -continue holding home AC, resume per vascular  -repeat CTA 11/10 stable   -PRN analgesia  -echo with preserved EF  -on Norvasc , Coreg, Imdur, chlorthalidone, -One SBP reading in 70's, held anti-hypertensives and given 500cc bolus  -stable for transfer out of the ICU today  AKI Likely secondary to Hypertensive emergency and is volume down -500cc bolus, monitor UOP -avoid nephrotoxins, monitor renal indices    Hx sz - keppra   Prediabetes w hyperglycemia -SSI   Tobacco use disorder -nicotine  replacement -cessation support  -anoro ellipta   History of bilateral pulmonary emboli  -2019  -holding anticoagulation   Constipation  -schedule miralax , continue senna and colace   Labs   CBC: Recent Labs  Lab 06/27/24 1144  06/28/24 0208 06/29/24 0217 06/30/24 0214 07/01/24 0234  WBC 9.1 10.2 11.3* 10.7* 11.9*  NEUTROABS 4.3  --   --   --   --   HGB 14.3 14.5 14.6 15.6* 14.2  HCT 46.0 43.5 43.9 46.5* 41.7  MCV 94.5 92.8 91.1 91.7 90.8  PLT 197 179 192 185 170    Basic Metabolic Panel: Recent Labs  Lab 06/27/24 1144 06/28/24 0208 06/29/24 0217 06/30/24 0214 07/01/24 0234  NA 142 139 136 137 137  K 3.8 3.5 3.5 3.8 3.8  CL 106 99 104 106 100  CO2 28 28 23  21* 25  GLUCOSE 104* 114* 112* 104* 105*  BUN 8 5* 6* 10  14  CREATININE 0.93 0.96 0.91 0.96 1.43*  CALCIUM 9.8 9.1 9.3 9.6 9.4  MG  --   --   --   --  2.0   GFR: Estimated Creatinine Clearance: 39.5 mL/min (A) (by C-G formula based on SCr of 1.43 mg/dL (H)). Recent Labs  Lab 06/28/24 0208 06/29/24 0217 06/30/24 0214 07/01/24 0234  WBC 10.2 11.3* 10.7* 11.9*      Shyleigh Daughtry R Otha Rickles, PA-C [facility] See Amion for pager If no response to pager , please call [phone] until 7pm After 7:00 pm call Elink  [phone]

## 2024-07-01 NOTE — Plan of Care (Signed)
   Problem: Education: Goal: Knowledge of General Education information will improve Description Including pain rating scale, medication(s)/side effects and non-pharmacologic comfort measures Outcome: Progressing   Problem: Health Behavior/Discharge Planning: Goal: Ability to manage health-related needs will improve Outcome: Progressing

## 2024-07-02 DIAGNOSIS — Z86711 Personal history of pulmonary embolism: Secondary | ICD-10-CM | POA: Diagnosis not present

## 2024-07-02 DIAGNOSIS — N179 Acute kidney failure, unspecified: Secondary | ICD-10-CM

## 2024-07-02 DIAGNOSIS — F17200 Nicotine dependence, unspecified, uncomplicated: Secondary | ICD-10-CM

## 2024-07-02 DIAGNOSIS — I71019 Dissection of thoracic aorta, unspecified: Secondary | ICD-10-CM

## 2024-07-02 DIAGNOSIS — I161 Hypertensive emergency: Secondary | ICD-10-CM

## 2024-07-02 DIAGNOSIS — Z87898 Personal history of other specified conditions: Secondary | ICD-10-CM

## 2024-07-02 DIAGNOSIS — I10 Essential (primary) hypertension: Secondary | ICD-10-CM

## 2024-07-02 DIAGNOSIS — R7303 Prediabetes: Secondary | ICD-10-CM

## 2024-07-02 LAB — BASIC METABOLIC PANEL WITH GFR
Anion gap: 9 (ref 5–15)
BUN: 14 mg/dL (ref 8–23)
CO2: 26 mmol/L (ref 22–32)
Calcium: 9.3 mg/dL (ref 8.9–10.3)
Chloride: 100 mmol/L (ref 98–111)
Creatinine, Ser: 1.5 mg/dL — ABNORMAL HIGH (ref 0.44–1.00)
GFR, Estimated: 36 mL/min — ABNORMAL LOW (ref >60)
Glucose, Bld: 111 mg/dL — ABNORMAL HIGH (ref 70–99)
Potassium: 3.5 mmol/L (ref 3.5–5.1)
Sodium: 135 mmol/L (ref 135–145)

## 2024-07-02 LAB — URINALYSIS, W/ REFLEX TO CULTURE (INFECTION SUSPECTED)
Bilirubin Urine: NEGATIVE
Glucose, UA: NEGATIVE mg/dL
Ketones, ur: NEGATIVE mg/dL
Leukocytes,Ua: NEGATIVE
Nitrite: NEGATIVE
Protein, ur: NEGATIVE mg/dL
Specific Gravity, Urine: 1.008 (ref 1.005–1.030)
pH: 6 (ref 5.0–8.0)

## 2024-07-02 LAB — CBC
HCT: 40 % (ref 36.0–46.0)
Hemoglobin: 13.4 g/dL (ref 12.0–15.0)
MCH: 30.7 pg (ref 26.0–34.0)
MCHC: 33.5 g/dL (ref 30.0–36.0)
MCV: 91.5 fL (ref 80.0–100.0)
Platelets: 181 K/uL (ref 150–400)
RBC: 4.37 MIL/uL (ref 3.87–5.11)
RDW: 14.6 % (ref 11.5–15.5)
WBC: 12.6 K/uL — ABNORMAL HIGH (ref 4.0–10.5)
nRBC: 0 % (ref 0.0–0.2)

## 2024-07-02 LAB — GLUCOSE, CAPILLARY
Glucose-Capillary: 105 mg/dL — ABNORMAL HIGH (ref 70–99)
Glucose-Capillary: 100 mg/dL — ABNORMAL HIGH (ref 70–99)
Glucose-Capillary: 133 mg/dL — ABNORMAL HIGH (ref 70–99)
Glucose-Capillary: 131 mg/dL — ABNORMAL HIGH (ref 70–99)

## 2024-07-02 LAB — TRIGLYCERIDES: Triglycerides: 83 mg/dL (ref <150)

## 2024-07-02 MED ORDER — LACTATED RINGERS IV SOLN: INTRAVENOUS | Status: AC

## 2024-07-02 MED ORDER — LACTATED RINGERS IV BOLUS
500.0000 mL | Freq: Once | INTRAVENOUS | Status: AC
  Administered 2024-07-02: 500 mL via INTRAVENOUS

## 2024-07-02 NOTE — Evaluation (Signed)
 Occupational Therapy Evaluation Patient Details Name: Jennifer Jacobs MRN: [id number] DOB: [birth year] Today's Date: 07/02/2024   History of Present Illness   78 yo F adm 06/27/24 with chest pain, HTN, aortic dissection. PMHx:depression, fibromyalgia, insomnia, AAA, HTN, seizure d/o, CVA     Clinical Impressions Pt admitted based on above, and was seen based on problem list below. PTA pt was living home alone, but receives assistance from [facility] aid for shower transfers and IADLs. Pt reports ind with basic ADLs, but history of falls. Today pt is requiring set up  to CGA for ADLs. Functional transfers are  CGA with use of rollator. Pt limited by decreased activity tolerance, requiring to complete grooming task seated after activity. Based on pt's fall history, recommending HHOT for home safety eval.  OT will continue to follow acutely to maximize functional independence.      If plan is discharge home, recommend the following:   A little help with walking and/or transfers;A little help with bathing/dressing/bathroom;Assistance with cooking/housework     Functional Status Assessment   Patient has had a recent decline in their functional status and demonstrates the ability to make significant improvements in function in a reasonable and predictable amount of time.     Equipment Recommendations   Tub/shower seat      Precautions/Restrictions   Precautions Precautions: Other (comment) Precaution/Restrictions Comments: SBP <120, HR <80 Restrictions Weight Bearing Restrictions Per Provider Order: No     Mobility Bed Mobility     General bed mobility comments: Received in standing with PT    Transfers Overall transfer level: Needs assistance Equipment used: Rollator (4 wheels) Transfers: Sit to/from Stand Sit to Stand: Contact guard assist           General transfer comment: CGA for balance. Reliant on BUE strength to stand      Balance Overall balance  assessment: Needs assistance, History of Falls Sitting-balance support: No upper extremity supported, Feet supported Sitting balance-Leahy Scale: Fair     Standing balance support: Bilateral upper extremity supported, During functional activity, Reliant on assistive device for balance Standing balance-Leahy Scale: Poor Standing balance comment: reliant on rollator         ADL either performed or assessed with clinical judgement   ADL Overall ADL's : Needs assistance/impaired Eating/Feeding: Set up;Sitting   Grooming: Set up;Sitting           Upper Body Dressing : Set up;Sitting   Lower Body Dressing: Contact guard assist;Sit to/from stand   Toilet Transfer: Contact guard assist;Ambulation;Rollator (4 wheels);Regular Toilet;Grab bars   Toileting- Clothing Manipulation and Hygiene: Contact guard assist;Sit to/from stand       Functional mobility during ADLs: Contact guard assist;Rollator (4 wheels) General ADL Comments: Limited by decreased activity tolerance     Vision Baseline Vision/History: 0 No visual deficits Vision Assessment?: No apparent visual deficits            Pertinent Vitals/Pain Pain Assessment Pain Assessment: Faces Faces Pain Scale: No hurt Pain Intervention(s): Monitored during session     Extremity/Trunk Assessment Upper Extremity Assessment Upper Extremity Assessment: Generalized weakness   Lower Extremity Assessment Lower Extremity Assessment: Defer to PT evaluation   Cervical / Trunk Assessment Cervical / Trunk Assessment: Kyphotic   Communication Communication Communication: No apparent difficulties   Cognition Arousal: Alert Behavior During Therapy: Flat affect Cognition: No apparent impairments     Following commands: Intact       Cueing  General Comments   Cueing Techniques: Verbal cues  Pt with soft BP throughout session but denies any symptoms           Home Living Family/patient expects to be discharged  to:: Private residence Living Arrangements: Alone Available Help at Discharge: Family;Available PRN/intermittently;Home health [facility] aidMonday-Friday 3 hours in AM) Type of Home: Apartment Home Access: Stairs to enter;Elevator     Home Layout: One level     Bathroom Shower/Tub: Chief Strategy Officer: Standard     Home Equipment: Rollator (4 wheels);Grab bars - tub/shower;Grab bars - toilet          Prior Functioning/Environment Prior Level of Function : Needs assist;History of Falls (last six months)             Mobility Comments: Use of rollator, reports falls at least once a week ADLs Comments: HH aid assists with showers and meals    OT Problem List: Decreased strength;Decreased range of motion;Decreased activity tolerance;Impaired balance (sitting and/or standing);Decreased safety awareness;Decreased knowledge of use of DME or AE;Cardiopulmonary status limiting activity   OT Treatment/Interventions: Self-care/ADL training;Therapeutic exercise;Energy conservation;DME and/or AE instruction;Therapeutic activities;Patient/family education;Balance training      OT Goals(Current goals can be found in the care plan section)   Acute Rehab OT Goals OT Goal Formulation: With patient Time For Goal Achievement: 07/16/24 Potential to Achieve Goals: Good   OT Frequency:  Min 2X/week       AM-PAC OT 6 Clicks Daily Activity     Outcome Measure Help from another person eating meals?: None Help from another person taking care of personal grooming?: A Little Help from another person toileting, which includes using toliet, bedpan, or urinal?: A Little Help from another person bathing (including washing, rinsing, drying)?: A Little Help from another person to put on and taking off regular upper body clothing?: A Little Help from another person to put on and taking off regular lower body clothing?: A Little 6 Click Score: 19   End of Session Equipment Utilized  During Treatment: Gait belt;Rollator (4 wheels) Nurse Communication: Mobility status  Activity Tolerance: Patient tolerated treatment well Patient left: in chair;with call bell/phone within reach;with chair alarm set  OT Visit Diagnosis: Unsteadiness on feet (R26.81);Other abnormalities of gait and mobility (R26.89);Repeated falls (R29.6);Muscle weakness (generalized) (M62.81);History of falling (Z91.81)                Time: 9086-9063 OT Time Calculation (min): 23 min Charges:  OT General Charges $OT Visit: 1 Visit OT Evaluation $OT Eval Moderate Complexity: 1 Mod  Adrianne BROCKS, OT  Acute Rehabilitation Services Office [phone] Secure chat preferred   Adrianne GORMAN Savers 07/02/2024, 10:26 AM

## 2024-07-02 NOTE — TOC CM/SW Note (Signed)
 Transition of Care [facility]) - Inpatient Brief Assessment   Patient Details  Name: Jennifer Jacobs MRN: [id number] Date of Birth: [birth year]  [facility] CM/SW Contact:    Jennifer Barnie Rama, RN Phone Number: 07/02/2024, 4:50 PM   Clinical Narrative: From home alone, , has PCP and insurance on file, states has no HH services in place at this time but she does have an aide with [facility] 3 hrs a day Mon-Fri , has walker, cane, oxygen as needed, neb machine  at home.  States family member will transport them home at [employer] , states [facility]  Pta self ambulatory with walker.  Per PT/OT eval rec HHPT, HHOT,  and shower stool, she states she does not need a shower stool, and she does not have a preference of the [facility] agency,  NCM sent referral out to agencies.  Awaiting an acceptance.     Transition of Care Asessment: Insurance and Status: Insurance coverage has been reviewed Patient has primary care physician: Yes Home environment has been reviewed: home alone Prior level of function:: ambulatory with walker Prior/Current Home Services: Current home services (walker, cane, oxygen as needed, neb machine) Social Drivers of Health Review: SDOH reviewed no interventions necessary Readmission risk has been reviewed: Yes Transition of care needs: transition of care needs identified, TOC will continue to follow

## 2024-07-02 NOTE — Assessment & Plan Note (Signed)
Counseling was provided. -Nicotine patch as needed 

## 2024-07-02 NOTE — Hospital Course (Addendum)
 PCCM transfer for 07/02/2024.  Taken from prior notes.  Jennifer Jacobs is a 78 y/o woman with a history of cerebral aneurysms, previous strokes, MI, hypertension who presented with sudden onset chest pain radiating into her back.   Patient was found to have significantly elevated blood pressure on presentation.  She was found to have a type B aortic dissection.  She was started on Cleviprex and esmolol  and admitted in ICU.  Vascular surgery was also consulted.  Echocardiogram with preserved EF, repeat CTA seems stable.  She was eventually weaned off from IV infusions and started on p.o. antihypertensives with a goal systolic less than 120 and goal heart rate around 60es.  Care transferred to [facility] on 11/13.  11/13: Vital stable with soft blood pressure, heart rate mostly in 70es, patient had an episode of hypotension overnight and was given about 1 L of bolus.  Renal function with worsening creatinine to 1.5, baseline less than 1-giving some IV fluid, chlorthalidone was discontinued and placed holding parameters on amlodipine  and irbesartan. PT is recommending home health.  11/14: Patient had 1 episode of becoming febrile at 101, having some cough with greenish sputum.  Procalcitonin at 0.15, improving leukocytosis and creatinine.  Chest x-ray was also obtained with pending results.  Patient wants to go home as tomorrow is her birthday.  Overall feeling much improved.  She was started on Augmentin and Zithromax.  She will keep holding her Eliquis  until seen by vascular surgery and they will resume as appropriate.  She is being discharged on amlodipine , irbesartan and Coreg.  Need a close follow-up with PCP for better control of her blood pressure.  Her goal systolic is less than 120 at this time.  She will continue on current medications and need to have a close follow-up with her providers for further assistance.

## 2024-07-02 NOTE — Evaluation (Signed)
 Physical Therapy Evaluation Patient Details Name: Jennifer Jacobs MRN: [id number] DOB: Sep 19, 1945 Today's Date: 07/02/2024  History of Present Illness  78 yo F adm 06/27/24 with chest pain, HTN, aortic dissection. PMHx:depression, fibromyalgia, insomnia, AAA, HTN, seizure d/o, CVA  Clinical Impression  Pt pleasant and reports living alone with PCA to assist 6 days a weeks for 3hrs, hx of falls. Pt with HR 72-80 during session and SBP <100. Pt with decreased activity tolerance, balance and gait who will benefit from acute therapy to maximize mobility, safety and function to decrease fall risk.  Supine 90/58 (68) Sitting 88/65 (63)        If plan is discharge home, recommend the following: A little help with bathing/dressing/bathroom;Assistance with cooking/housework;Assist for transportation   Can travel by private vehicle        Equipment Recommendations None recommended by PT  Recommendations for Other Services       Functional Status Assessment Patient has had a recent decline in their functional status and demonstrates the ability to make significant improvements in function in a reasonable and predictable amount of time.     Precautions / Restrictions Precautions Precautions: Other (comment);Fall Recall of Precautions/Restrictions: Intact Precaution/Restrictions Comments: SBP <120, HR <80      Mobility  Bed Mobility Overal bed mobility: Modified Independent             General bed mobility comments: use of rail with HOB 15 degrees    Transfers Overall transfer level: Needs assistance   Transfers: Sit to/from Stand Sit to Stand: Supervision           General transfer comment: supervision to rise from bed, recliner and rollator. pt with good use of brakes and no physical assist to rise    Ambulation/Gait Ambulation/Gait assistance: Supervision Gait Distance (Feet): 60 Feet Assistive device: Rollator (4 wheels) Gait Pattern/deviations: Step-through  pattern, Decreased stride length   Gait velocity interpretation: <1.8 ft/sec, indicate of risk for recurrent falls   General Gait Details: pt using rollator with HR 74-80 limiting distance with seated rest with elevated HR. Pt walked 60' x 2 trials  Stairs            Wheelchair Mobility     Tilt Bed    Modified Rankin (Stroke Patients Only)       Balance Overall balance assessment: Needs assistance, History of Falls Sitting-balance support: No upper extremity supported, Feet supported Sitting balance-Leahy Scale: Fair     Standing balance support: Bilateral upper extremity supported, During functional activity, Reliant on assistive device for balance Standing balance-Leahy Scale: Poor Standing balance comment: reliant on rollator                             Pertinent Vitals/Pain Pain Assessment Pain Assessment: No/denies pain    Home Living Family/patient expects to be discharged to:: Private residence Living Arrangements: Alone Available Help at Discharge: Available PRN/intermittently;Personal care attendant Type of Home: Apartment Home Access: Stairs to enter;Elevator       Home Layout: One level Home Equipment: Rollator (4 wheels);Grab bars - tub/shower;Grab bars - toilet;Shower seat      Prior Function Prior Level of Function : Needs assist;History of Falls (last six months)             Mobility Comments: Use of rollator, reports falls at least once a week ADLs Comments: HH aid assists with showers and meals     Extremity/Trunk Assessment  Upper Extremity Assessment Upper Extremity Assessment: Generalized weakness    Lower Extremity Assessment Lower Extremity Assessment: Generalized weakness    Cervical / Trunk Assessment Cervical / Trunk Assessment: Kyphotic  Communication   Communication Communication: No apparent difficulties    Cognition Arousal: Alert Behavior During Therapy: WFL for tasks assessed/performed    PT - Cognitive impairments: No apparent impairments                         Following commands: Intact       Cueing Cueing Techniques: Verbal cues     General Comments General comments (skin integrity, edema, etc.): Pt with soft BP throughout session but denies any symptoms    Exercises     Assessment/Plan    PT Assessment Patient needs continued PT services  PT Problem List Decreased activity tolerance;Decreased balance;Decreased mobility       PT Treatment Interventions Gait training;Functional mobility training;Therapeutic activities;Patient/family education;Therapeutic exercise    PT Goals (Current goals can be found in the Care Plan section)  Acute Rehab PT Goals Patient Stated Goal: return home PT Goal Formulation: With patient Time For Goal Achievement: 07/16/24 Potential to Achieve Goals: Good    Frequency Min 2X/week     Co-evaluation               AM-PAC PT 6 Clicks Mobility  Outcome Measure Help needed turning from your back to your side while in a flat bed without using bedrails?: None Help needed moving from lying on your back to sitting on the side of a flat bed without using bedrails?: A Little Help needed moving to and from a bed to a chair (including a wheelchair)?: A Little Help needed standing up from a chair using your arms (e.g., wheelchair or bedside chair)?: A Little Help needed to walk in hospital room?: A Little Help needed climbing 3-5 steps with a railing? : A Little 6 Click Score: 19    End of Session Equipment Utilized During Treatment: Gait belt Activity Tolerance: Patient tolerated treatment well Patient left: Other (comment) (left standing with OT at sink) Nurse Communication: Mobility status PT Visit Diagnosis: Other abnormalities of gait and mobility (R26.89);Difficulty in walking, not elsewhere classified (R26.2);History of falling (Z91.81)    Time: 9099-9081 PT Time Calculation (min) (ACUTE ONLY): 18  min   Charges:   PT Evaluation $PT Eval Moderate Complexity: 1 Mod   PT General Charges $$ ACUTE PT VISIT: 1 Visit         Lenoard SQUIBB, PT Acute Rehabilitation Services Office: [phone]   Lenoard NOVAK Imir Brumbach 07/02/2024, 10:39 AM

## 2024-07-02 NOTE — Assessment & Plan Note (Signed)
 A1c of 5.7, CBG within goal. - Continue with sensitive SSI

## 2024-07-02 NOTE — Assessment & Plan Note (Signed)
 Continue home Keppra

## 2024-07-02 NOTE — Progress Notes (Signed)
 Progress Note   Patient: Jennifer Jacobs FMW:[id number] DOB: 04-16-1946 DOA: 06/27/2024     5 DOS: the patient was seen and examined on 07/02/2024   Brief hospital course: PCCM transfer for 07/02/2024.  Taken from prior notes.  Jennifer Jacobs is a 78 y/o woman with a history of cerebral aneurysms, previous strokes, MI, hypertension who presented with sudden onset chest pain radiating into her back.   Patient was found to have significantly elevated blood pressure on presentation.  She was found to have a type B aortic dissection.  She was started on Cleviprex and esmolol  and admitted in ICU.  Vascular surgery was also consulted.  Echocardiogram with preserved EF, repeat CTA seems stable.  She was eventually weaned off from IV infusions and started on p.o. antihypertensives with a goal systolic less than 120 and goal heart rate around 60es.  Care transferred to [facility] on 11/13.  11/13: Vital stable with soft blood pressure, heart rate mostly in 70es, patient had an episode of hypotension overnight and was given about 1 L of bolus.  Renal function with worsening creatinine to 1.5, baseline less than 1-giving some IV fluid, chlorthalidone was discontinued and placed holding parameters on amlodipine  and irbesartan. PT is recommending home health  Assessment and Plan: * Aortic dissection distal to left subclavian (HCC) Type B aortic dissection. Hypertensive emergency on admission.  Initially managed in ICU with esmolol  and clevi infusions.  Pain and blood pressure improved. Vascular surgery was consulted, repeat CTA stable. -Anticoagulation is being held. - Follow-up vascular surgery recommendations  Hypertension Blood pressure currently soft, had significant hypotension overnight requiring about 1L of bolus. -Giving 500 cc more bolus -Discontinue chlorthalidone -Amlodipine  and irbesartan with holding parameters -Continue with Coreg -Monitor blood pressure  AKI (acute kidney  injury) Creatinine increased to 1.50 with baseline around 0.9.  Likely secondary to ATN with significant hypotension overnight. - Giving some IV fluid -Monitor renal function -Avoid nephrotoxins -chlorthalidone was discontinued  History of pulmonary embolus (PE) Patient has history of bilateral PE in 2019 -Anticoagulation is being held currently-need vascular surgery clearance before resuming  History of seizure - Continue home Keppra   Prediabetes A1c of 5.7, CBG within goal. - Continue with sensitive SSI  Tobacco use disorder Counseling was provided. - Nicotine  patch as needed   Subjective: Patient was sitting comfortably in chair when seen today.  Denies any chest pain or shortness of breath.  Physical Exam: Vitals:   07/02/24 0750 07/02/24 1034 07/02/24 1133 07/02/24 1246  BP: 94/65 (!) 88/65 (!) 77/61 100/81  Pulse: 74 78 77   Resp: 17  19 19   Temp: 98 F (36.7 C)  98.8 F (37.1 C)   TempSrc: Oral  Oral   SpO2: 97%  96%   Weight:      Height:       General.  Frail elderly lady, in no acute distress. Pulmonary.  Lungs clear bilaterally, normal respiratory effort. CV.  Regular rate and rhythm, no JVD, rub or murmur. Abdomen.  Soft, nontender, nondistended, BS positive. CNS.  Alert and oriented .  No focal neurologic deficit. Extremities.  No edema,  pulses intact and symmetrical. Psychiatry.  Judgment and insight appears normal.   Data Reviewed: Prior data reviewed  Family Communication: Talked with son on phone.  Disposition: Status is: Inpatient Remains inpatient appropriate because: Severity of illness  Planned Discharge Destination: Home with Home Health  DVT prophylaxis.  SCDs Time spent: 50 minutes  This record has been created using [employer]  voice recognition software. Errors have been sought and corrected,but may not always be located. Such creation errors do not reflect on the standard of care.   Author: Amaryllis Dare, MD 07/02/2024 2:20  PM  For on call review [email or web address].

## 2024-07-02 NOTE — Assessment & Plan Note (Signed)
 Creatinine increased to 1.50 with baseline around 0.9.  Likely secondary to ATN with significant hypotension overnight. - Giving some IV fluid -Monitor renal function -Avoid nephrotoxins -chlorthalidone was discontinued

## 2024-07-02 NOTE — Assessment & Plan Note (Signed)
 Patient has history of bilateral PE in 2019 -Anticoagulation is being held currently-need vascular surgery clearance before resuming

## 2024-07-02 NOTE — Assessment & Plan Note (Signed)
 Type B aortic dissection. Hypertensive emergency on admission.  Initially managed in ICU with esmolol  and clevi infusions.  Pain and blood pressure improved. Vascular surgery was consulted, repeat CTA stable. -Anticoagulation is being held. - Follow-up vascular surgery recommendations

## 2024-07-02 NOTE — Assessment & Plan Note (Signed)
 Blood pressure currently soft, had significant hypotension overnight requiring about 1L of bolus. -Giving 500 cc more bolus -Discontinue chlorthalidone -Amlodipine  and irbesartan with holding parameters -Continue with Coreg -Monitor blood pressure

## 2024-07-03 ENCOUNTER — Inpatient Hospital Stay (HOSPITAL_COMMUNITY)

## 2024-07-03 DIAGNOSIS — N179 Acute kidney failure, unspecified: Secondary | ICD-10-CM | POA: Diagnosis not present

## 2024-07-03 DIAGNOSIS — R072 Precordial pain: Secondary | ICD-10-CM | POA: Diagnosis not present

## 2024-07-03 DIAGNOSIS — I71019 Dissection of thoracic aorta, unspecified: Secondary | ICD-10-CM | POA: Diagnosis not present

## 2024-07-03 DIAGNOSIS — Z86711 Personal history of pulmonary embolism: Secondary | ICD-10-CM | POA: Diagnosis not present

## 2024-07-03 LAB — GLUCOSE, CAPILLARY
Glucose-Capillary: 104 mg/dL — ABNORMAL HIGH (ref 70–99)
Glucose-Capillary: 119 mg/dL — ABNORMAL HIGH (ref 70–99)

## 2024-07-03 LAB — CBC
HCT: 38.3 % (ref 36.0–46.0)
Hemoglobin: 12.8 g/dL (ref 12.0–15.0)
MCH: 30.3 pg (ref 26.0–34.0)
MCHC: 33.4 g/dL (ref 30.0–36.0)
MCV: 90.8 fL (ref 80.0–100.0)
Platelets: 171 K/uL (ref 150–400)
RBC: 4.22 MIL/uL (ref 3.87–5.11)
RDW: 14.6 % (ref 11.5–15.5)
WBC: 11.2 K/uL — ABNORMAL HIGH (ref 4.0–10.5)
nRBC: 0 % (ref 0.0–0.2)

## 2024-07-03 LAB — BASIC METABOLIC PANEL WITH GFR
Anion gap: 12 (ref 5–15)
BUN: 14 mg/dL (ref 8–23)
CO2: 22 mmol/L (ref 22–32)
Calcium: 9.1 mg/dL (ref 8.9–10.3)
Chloride: 103 mmol/L (ref 98–111)
Creatinine, Ser: 1 mg/dL (ref 0.44–1.00)
GFR, Estimated: 59 mL/min — ABNORMAL LOW (ref >60)
Glucose, Bld: 112 mg/dL — ABNORMAL HIGH (ref 70–99)
Potassium: 3.8 mmol/L (ref 3.5–5.1)
Sodium: 137 mmol/L (ref 135–145)

## 2024-07-03 LAB — PROCALCITONIN: Procalcitonin: 0.14 ng/mL

## 2024-07-03 LAB — TRIGLYCERIDES: Triglycerides: 68 mg/dL (ref <150)

## 2024-07-03 MED ORDER — DM-GUAIFENESIN ER 30-600 MG PO TB12: 1.0000 | ORAL_TABLET | Freq: Two times a day (BID) | ORAL | 0 refills | Status: AC

## 2024-07-03 MED ORDER — AMLODIPINE BESYLATE 10 MG PO TABS: 10.0000 mg | ORAL_TABLET | Freq: Every day | ORAL | 1 refills | Status: AC

## 2024-07-03 MED ORDER — AMOXICILLIN-POT CLAVULANATE 875-125 MG PO TABS
1.0000 | ORAL_TABLET | Freq: Two times a day (BID) | ORAL | Status: DC
  Administered 2024-07-03: 1 via ORAL
  Filled 2024-07-03 (×2): qty 1

## 2024-07-03 MED ORDER — IRBESARTAN 300 MG PO TABS: 300.0000 mg | ORAL_TABLET | Freq: Every day | ORAL | 1 refills | Status: AC

## 2024-07-03 MED ORDER — AZITHROMYCIN 250 MG PO TABS: ORAL_TABLET | ORAL | 0 refills | Status: AC

## 2024-07-03 MED ORDER — CARVEDILOL 25 MG PO TABS: 25.0000 mg | ORAL_TABLET | Freq: Two times a day (BID) | ORAL | 1 refills | Status: AC

## 2024-07-03 MED ORDER — AMOXICILLIN-POT CLAVULANATE 875-125 MG PO TABS: 1.0000 | ORAL_TABLET | Freq: Two times a day (BID) | ORAL | 0 refills | Status: AC

## 2024-07-03 NOTE — TOC Transition Note (Addendum)
 Transition of Care [facility]) - Discharge Note   Patient Details  Name: Jennifer Jacobs MRN: [id number] Date of Birth: 1946-02-22  Transition of Care [facility] CM/SW Contact:  Waddell Barnie Rama, RN Phone Number: 07/03/2024, 2:08 PM   Clinical Narrative:    For dc today, she is set up with [facility] for [facility] services.  NCM notified [facility].  She has transport home today.      Barriers to Discharge: Continued Medical Work up   Patient Goals and CMS Choice Patient states their goals for this hospitalization and ongoing recovery are:: Plans to return home once stable.          Discharge Placement                       Discharge Plan and Services Additional resources added to the After Visit Summary for   In-house Referral: NA Discharge Planning Services: CM Consult Post Acute Care Choice: NA            DME Agency: NA       HH Arranged: NA          Social Drivers of Health (SDOH) Interventions SDOH Screenings   Food Insecurity: No Food Insecurity (06/27/2024)  Housing: Low Risk  (06/27/2024)  Transportation Needs: No Transportation Needs (06/27/2024)  Utilities: Not At Risk (06/27/2024)  Social Connections: Moderately Integrated (06/27/2024)  Tobacco Use: High Risk (06/27/2024)     Readmission Risk Interventions    07/02/2024    4:48 PM  Readmission Risk Prevention Plan  Post Dischage Appt Complete  Medication Screening Complete  Transportation Screening Complete

## 2024-07-03 NOTE — Discharge Summary (Signed)
 Physician Discharge Summary   Patient: Jennifer Jacobs MRN: [id number] DOB: 25-Oct-1945  Admit date:     06/27/2024  Discharge date: 07/03/24  Discharge Physician: Amaryllis Dare   PCP: Leontine Cramp, NP   Recommendations at discharge:  Please obtain CBC and BMP on follow-up Please monitor blood pressure closely and keep systolic below 120 Eliquis  has been held-vascular surgery can resume when appropriate Follow-up with primary care provider Follow-up with vascular surgery  Discharge Diagnoses: Principal Problem:   Aortic dissection distal to left subclavian [facility]) Active Problems:   Hypertension   AKI (acute kidney injury)   History of pulmonary embolus (PE)   History of seizure   Prediabetes   Tobacco use disorder   Acute dissection of thoracic aorta [facility])   Hypertensive emergency   Hospital Course: PCCM transfer for 07/02/2024.  Taken from prior notes.  Jennifer Jacobs is a 78 y/o woman with a history of cerebral aneurysms, previous strokes, MI, hypertension who presented with sudden onset chest pain radiating into her back.   Patient was found to have significantly elevated blood pressure on presentation.  She was found to have a type B aortic dissection.  She was started on Cleviprex and esmolol  and admitted in ICU.  Vascular surgery was also consulted.  Echocardiogram with preserved EF, repeat CTA seems stable.  She was eventually weaned off from IV infusions and started on p.o. antihypertensives with a goal systolic less than 120 and goal heart rate around 60es.  Care transferred to [facility] on 11/13.  11/13: Vital stable with soft blood pressure, heart rate mostly in 70es, patient had an episode of hypotension overnight and was given about 1 L of bolus.  Renal function with worsening creatinine to 1.5, baseline less than 1-giving some IV fluid, chlorthalidone was discontinued and placed holding parameters on amlodipine  and irbesartan. PT is recommending home health.  11/14:  Patient had 1 episode of becoming febrile at 101, having some cough with greenish sputum.  Procalcitonin at 0.15, improving leukocytosis and creatinine.  Chest x-ray was also obtained with pending results.  Patient wants to go home as tomorrow is her birthday.  Overall feeling much improved.  She was started on Augmentin and Zithromax.  She will keep holding her Eliquis  until seen by vascular surgery and they will resume as appropriate.  She is being discharged on amlodipine , irbesartan and Coreg.  Need a close follow-up with PCP for better control of her blood pressure.  Her goal systolic is less than 120 at this time.  She will continue on current medications and need to have a close follow-up with her providers for further assistance.  Assessment and Plan: * Aortic dissection distal to left subclavian (HCC) Type B aortic dissection. Hypertensive emergency on admission.  Initially managed in ICU with esmolol  and clevi infusions.  Pain and blood pressure improved. Vascular surgery was consulted, repeat CTA stable. -Anticoagulation is being held. - Follow-up vascular surgery recommendations  Hypertension Blood pressure currently within goal -Continue with Coreg, amlodipine  and irbesartan -Monitor blood pressure  AKI (acute kidney injury) Resolved with IV fluid -Monitor renal function -Avoid nephrotoxins -chlorthalidone was discontinued  History of pulmonary embolus (PE) Patient has history of bilateral PE in 2019 -Anticoagulation is being held currently-need vascular surgery clearance before resuming  History of seizure - Continue home Keppra   Prediabetes A1c of 5.7, CBG within goal. - Continue with sensitive SSI  Tobacco use disorder Counseling was provided. - Nicotine  patch as needed  Consultants: Vascular surgery Procedures performed:  None Disposition: Home health Diet recommendation:  Discharge Diet Orders (From admission, onward)     Start     Ordered    07/03/24 0000  Diet - low sodium heart healthy        07/03/24 1349           Regular diet DISCHARGE MEDICATION: Allergies as of 07/03/2024       Reactions   Bayer Aspirin  [aspirin ] Nausea Only   Prozac  [fluoxetine  Hcl] Other (See Comments)   Headaches        Medication List     PAUSE taking these medications    apixaban  5 MG Tabs tablet Wait to take this until your doctor or other care provider tells you to start again. Commonly known as: ELIQUIS  Take 5 mg by mouth in the morning and at bedtime.       STOP taking these medications    olmesartan  40 MG tablet Commonly known as: BENICAR        TAKE these medications    acetaminophen  325 MG tablet Commonly known as: TYLENOL  Take 650 mg by mouth 2 (two) times daily as needed for headache or fever (pain).   albuterol  108 (90 Base) MCG/ACT inhaler Commonly known as: VENTOLIN  HFA Inhale into the lungs every 6 (six) hours as needed for wheezing or shortness of breath.   amLODipine  10 MG tablet Commonly known as: NORVASC  Take 1 tablet (10 mg total) by mouth daily. Start taking on: July 04, 2024   amoxicillin -clavulanate 875-125 MG tablet Commonly known as: AUGMENTIN Take 1 tablet by mouth every 12 (twelve) hours for 5 days.   Anoro Ellipta 62.5-25 MCG/ACT Aepb Generic drug: umeclidinium-vilanterol Inhale 1 puff into the lungs daily.   azithromycin 250 MG tablet Commonly known as: Zithromax Z-Pak Take 2 tablets (500 mg) on  Day 1,  followed by 1 tablet (250 mg) once daily on Days 2 through 5.   carvedilol 25 MG tablet Commonly known as: COREG Take 1 tablet (25 mg total) by mouth 2 (two) times daily.   dextromethorphan-guaiFENesin  30-600 MG 12hr tablet Commonly known as: MUCINEX  DM Take 1 tablet by mouth 2 (two) times daily.   escitalopram  20 MG tablet Commonly known as: LEXAPRO  Take 1 tablet (20 mg total) by mouth 2 (two) times daily. What changed: when to take this   furosemide  20 MG  tablet Commonly known as: LASIX  Take 20 mg by mouth daily as needed for fluid.   gabapentin  800 MG tablet Commonly known as: NEURONTIN  Take 800 mg by mouth in the morning, at noon, and at bedtime.   Gemtesa 75 MG Tabs Generic drug: Vibegron Take 75 mg by mouth daily.   HYDROcodone -acetaminophen  5-325 MG tablet Commonly known as: NORCO/VICODIN Take 1 tablet by mouth 2 (two) times daily as needed for severe pain (pain score 7-10).   ipratropium-albuterol  0.5-2.5 (3) MG/3ML Soln Commonly known as: DUONEB Take 3 mLs by nebulization every 6 (six) hours as needed (wheezing, shortess of breath).   irbesartan 300 MG tablet Commonly known as: AVAPRO Take 1 tablet (300 mg total) by mouth at bedtime.   Kloxxado  8 MG/0.1ML Liqd Generic drug: Naloxone  HCl Place 1 spray into both nostrils as needed (opioid reversal).   levETIRAcetam  500 MG tablet Commonly known as: KEPPRA  Take 500 mg by mouth in the morning and at bedtime.   methocarbamol  500 MG tablet Commonly known as: ROBAXIN  Take 1 tablet (500 mg total) by mouth 2 (two) times daily as needed for muscle spasms. What changed:  when to take this   risperiDONE  0.5 MG tablet Commonly known as: RisperDAL  Take 1 tablet (0.5 mg total) by mouth at bedtime.   rosuvastatin 10 MG tablet Commonly known as: CRESTOR Take 10 mg by mouth at bedtime.   traZODone  50 MG tablet Commonly known as: DESYREL  Take 1 tablet (50 mg total) by mouth at bedtime.   VITAMIN B-12 PO Take 1 tablet by mouth daily.        Contact information for follow-up providers     Leontine Cramp, NP Follow up on 07/13/2024.   Specialty: Nurse Practitioner Why: 12:40  and your pick up time will be 12:20 for transportation Contact information: [address] [place] [address] [phone]         Magda Debby SAILOR, MD. Schedule an appointment as soon as possible for a visit in 1 week(s).   Specialties: Vascular Surgery, Interventional Cardiology Contact  information: [address] [place] [address] [phone]              Contact information for after-discharge care     Home Medical Care     [facility]) .   Service: Home Health Services Why: Agency will call you to set up apt times Contact information: [address] [facility] [place]  [address] [phone]                    Discharge Exam: Fredricka Weights   07/01/24 0530 07/02/24 0336 07/03/24 0611  Weight: 88.5 kg 91.4 kg 90.6 kg   General.  Frail elderly lady, in no acute distress. Pulmonary.  Lungs clear bilaterally, normal respiratory effort. CV.  Regular rate and rhythm, no JVD, rub or murmur. Abdomen.  Soft, nontender, nondistended, BS positive. CNS.  Alert and oriented .  No focal neurologic deficit. Extremities.  No edema, no cyanosis, pulses intact and symmetrical. Psychiatry.  Judgment and insight appears normal.   Condition at discharge: stable  The results of significant diagnostics from this hospitalization (including imaging, microbiology, ancillary and laboratory) are listed below for reference.   Imaging Studies: ECHOCARDIOGRAM COMPLETE Result Date: 06/30/2024    ECHOCARDIOGRAM REPORT   Patient Name:   Keyerra Lamere Amon Date of Exam: 06/30/2024 Medical Rec #:  [id number]        Height:       68.0 in Accession #:    [id number]       Weight:       192.9 lb Date of Birth:  1946-05-05       BSA:          2.013 m Patient Age:    77 years         BP:           142/92 mmHg Patient Gender: F                HR:           69 bpm. Exam Location:  Inpatient Procedure: 2D Echo, Cardiac Doppler and Color Doppler (Both Spectral and Color            Flow Doppler were utilized during procedure). Indications:    Hypertensive emergency  History:        Patient has prior history of Echocardiogram examinations, most                 recent 01/11/2018. Previous Myocardial Infarction, COPD, Stroke  and  Aortic Dissection, Signs/Symptoms:Chest Pain; Risk                 Factors:Hypertension, Current Smoker and Dyslipidemia.  Sonographer:    Juliene Rucks Referring Phys: LEITA JONELLE EINSTEIN  Sonographer Comments: Technically difficult study due to poor echo windows and patient is obese. Image acquisition challenging due to COPD and Image acquisition challenging due to patient body habitus. IMPRESSIONS  1. Left ventricular ejection fraction, by estimation, is 55 to 60%. The left ventricle has normal function. The left ventricle has no regional wall motion abnormalities. There is mild concentric left ventricular hypertrophy. Left ventricular diastolic parameters are consistent with Grade I diastolic dysfunction (impaired relaxation).  2. Right ventricular systolic function is normal. The right ventricular size is not well visualized. Tricuspid regurgitation signal is inadequate for assessing PA pressure.  3. The mitral valve is normal in structure. No evidence of mitral valve regurgitation. No evidence of mitral stenosis.  4. The aortic valve is tricuspid. There is mild thickening of the aortic valve. Aortic valve regurgitation is not visualized. Aortic valve sclerosis is present, with no evidence of aortic valve stenosis. Comparison(s): Prior images unable to be directly viewed, comparison made by report only. FINDINGS  Left Ventricle: Left ventricular ejection fraction, by estimation, is 55 to 60%. The left ventricle has normal function. The left ventricle has no regional wall motion abnormalities. The left ventricular internal cavity size was normal in size. There is  mild concentric left ventricular hypertrophy. Left ventricular diastolic parameters are consistent with Grade I diastolic dysfunction (impaired relaxation). Right Ventricle: The right ventricular size is not well visualized. Right vetricular wall thickness was not well visualized. Right ventricular systolic function is normal. Tricuspid regurgitation signal  is inadequate for assessing PA pressure. Left Atrium: Left atrial size was normal in size. Right Atrium: Right atrial size was not well visualized. Pericardium: There is no evidence of pericardial effusion. Presence of epicardial fat layer. Mitral Valve: The mitral valve is normal in structure. Mild mitral annular calcification. No evidence of mitral valve regurgitation. No evidence of mitral valve stenosis. Tricuspid Valve: The tricuspid valve is grossly normal. Tricuspid valve regurgitation is not demonstrated. Aortic Valve: The aortic valve is tricuspid. There is mild thickening of the aortic valve. Aortic valve regurgitation is not visualized. Aortic valve sclerosis is present, with no evidence of aortic valve stenosis. Pulmonic Valve: The pulmonic valve was not well visualized. Pulmonic valve regurgitation is not visualized. No evidence of pulmonic stenosis. Aorta: The aortic root is normal in size and structure and the ascending aorta was not well visualized. IAS/Shunts: The interatrial septum was not well visualized.  LEFT VENTRICLE PLAX 2D LVIDd:         3.80 cm   Diastology LVIDs:         2.70 cm   LV e' medial:    4.48 cm/s LV PW:         1.10 cm   LV E/e' medial:  12.7 LV IVS:        1.30 cm   LV e' lateral:   4.85 cm/s LVOT diam:     2.00 cm   LV E/e' lateral: 11.8 LV SV:         57 LV SV Index:   29 LVOT Area:     3.14 cm  RIGHT VENTRICLE RV S prime:     11.10 cm/s LEFT ATRIUM         Index LA diam:    2.80 cm  1.39 cm/m  AORTIC VALVE LVOT Vmax:   104.00 cm/s LVOT Vmean:  65.500 cm/s LVOT VTI:    0.183 m  AORTA Ao Root diam: 3.50 cm MITRAL VALVE MV Area (PHT): 3.31 cm    SHUNTS MV Decel Time: 229 msec    Systemic VTI:  0.18 m MV E velocity: 57.00 cm/s  Systemic Diam: 2.00 cm MV A velocity: 81.70 cm/s MV E/A ratio:  0.70 Mihai Croitoru MD Electronically signed by Jerel Balding MD Signature Date/Time: 06/30/2024/1:39:38 PM    Final    CT Angio Chest/Abd/Pel for Dissection W and/or W/WO Result Date:  06/29/2024 CLINICAL DATA:  Acute aortic syndrome EXAM: CT ANGIOGRAPHY CHEST, ABDOMEN AND PELVIS TECHNIQUE: Non-contrast CT of the chest was initially obtained. Multidetector CT imaging through the chest, abdomen and pelvis was performed using the standard protocol during bolus administration of intravenous contrast. Multiplanar reconstructed images and MIPs were obtained and reviewed to evaluate the vascular anatomy. RADIATION DOSE REDUCTION: This exam was performed according to the departmental dose-optimization program which includes automated exposure control, adjustment of the mA and/or kV according to patient size and/or use of iterative reconstruction technique. CONTRAST:  OMNIPAQUE  IOHEXOL  350 MG/ML SOLN COMPARISON:  CT angiogram of the chest abdomen and pelvis performed June 27, 2024 FINDINGS: CTA CHEST FINDINGS Cardiovascular: Motion related changes are present in the heart. Calcific atherosclerosis is present in the left and right coronary territories. Motion changes extend to the aortic root. The tubular ascending thoracic aorta measures 4.1 cm in largest transverse diameter, similar. Three-vessel aortic arch. Proximal arch vessels are patent. Aortic dissection originates just distal to the origin of the left subclavian artery with a small focus of retrograde tracking along the posterior wall. This appearance is similar when compared to June 27, 2024. The morphology of the dissection in the proximal descending segment is also similar. The largest transverse dimension in the proximal descending segment is 5.0 cm, similar. Two lumens are identified in the proximal descending segment. In the mid segment, the second lumen is thrombosed. Mediastinum/Nodes: Not significantly changed. Similar appearance of right-sided thyroid  nodule measuring 10 mm. No evidence of significant adenopathy. Lungs/Pleura: No focal consolidation or significant pleural effusion. A small focus of pleural thickening is  present in the posterior left hemithorax which is similar when compared to the previous exam. Musculoskeletal: No chest wall abnormality. No acute or significant osseous findings. Review of the MIP images confirms the above findings. CTA ABDOMEN AND PELVIS FINDINGS VASCULAR Aorta: Fusiform aneurysm of the infrarenal abdominal aortic segment measuring 3.7 cm using 3D measurement techniques. Flow artifact is present within the aneurysm. Celiac: Patent. SMA: Patent. Renals: Single renal arteries bilaterally. The origin of the right main renal artery is mildly dilated measuring 10 mm, similar. Moderate atherosclerotic changes are present at the origin of the left main renal artery. IMA: Patent Inflow: Patent with mild diffuse atherosclerotic changes. The right common iliac artery measures 1.3 cm. The left common iliac artery measures 2.0 cm. Veins: No obvious venous abnormality within the limitations of this arterial phase study. Review of the MIP images confirms the above findings. NON-VASCULAR Hepatobiliary: Central liver cyst, unchanged.  Gallstones. Pancreas: Unremarkable. No pancreatic ductal dilatation or surrounding inflammatory changes. Spleen: Indeterminate structure within the central spleen measuring 2 cm in diameter, unchanged. Adrenals/Urinary Tract: The adrenal glands are unchanged. Cysts are present in both kidneys with the largest cyst in the left kidney measuring 5.7 cm and demonstrating simple imaging features. The urinary bladder is grossly unremarkable. Stomach/Bowel: No  dilated loops of bowel are seen. Lymphatic: No significant lymphadenopathy. Reproductive: Reproductive organs are similar in appearance. A trace volume of free fluid is suspected in the pelvis. Other: Fat containing umbilical hernia. Musculoskeletal: Mild degenerative changes in the imaged osseous structures. Review of the MIP images confirms the above findings. IMPRESSION: 1. Similar appearance of aortic dissection involving the  proximal descending thoracic aorta with superimposed aneurysm. Representative measurements are detailed above and are similar. 2. The thoracic aortic dissection does not extend beyond the celiac artery, unchanged. 3. Unchanged, patent visceral arteries. 4. Stable fusiform aneurysm of the infrarenal abdominal aorta measuring 3.7 cm. Electronically Signed   By: Maude Naegeli M.D.   On: 06/29/2024 10:23   CT Angio Chest/Abd/Pel for Dissection W and/or W/WO Result Date: 06/27/2024 EXAM: CTA CHEST, ABDOMEN AND PELVIS WITH AND WITHOUT CONTRAST 06/27/2024 01:13:26 PM TECHNIQUE: CTA of the chest was performed with and without the administration of intravenous contrast. CTA of the abdomen and pelvis was performed only with the administration of intravenous contrast. There is no noncontrast phase of the abdomen and pelvis. Multiplanar reformatted images are provided for review. MIP images are provided for review. Automated exposure control, iterative reconstruction, and/or weight based adjustment of the mA/kV was utilized to reduce the radiation dose to as low as reasonably achievable. COMPARISON: Same day x-ray, CTA chest 01/13/2018, and CT abdomen and pelvis 03/22/2015. CLINICAL HISTORY: Acute aortic syndrome (AAS) suspected. FINDINGS: VASCULATURE: AORTA: Acute aortic dissection originating at the origin of the left subclavian artery and extending inferiorly through the descending thoracic aorta terminating at the origin of the celiac axis. The false lumen is largely non-opacified. The aortic arch branch vessels as well as the mesenteric arteries and renal arteries arise from the true lumen. The maximum diameter of the dissection in the aortic arch is 4.9 cm. Diameter of the mid ascending aorta is 3.3 cm. Aortic atherosclerotic calcifications. 3.7 cm infrarenal abdominal aortic aneurysm. PULMONARY ARTERIES: No pulmonary embolism with the limits of this exam. GREAT VESSELS OF AORTIC ARCH: The aortic dissection involves the  origin of the left subclavian artery. The aortic arch branch vessels arise from the true lumen. No arterial occlusion or significant stenosis. CELIAC TRUNK: The dissection terminates at the origin of the celiac axis. The celiac trunk arises from the true lumen. No occlusion or significant stenosis. SUPERIOR MESENTERIC ARTERY: The superior mesenteric artery arises from the true lumen. No acute finding. No occlusion or significant stenosis. INFERIOR MESENTERIC ARTERY: The inferior mesenteric artery arises from the true lumen. No acute finding. No occlusion or significant stenosis. RENAL ARTERIES: The renal arteries arise from the true lumen. Calcified and noncalcified plaque at the origin of the left renal artery cause severe narrowing. ILIAC ARTERIES: No acute finding. No occlusion or significant stenosis. CHEST: MEDIASTINUM: Coronary artery atherosclerotic calcifications. No pericardial effusion. No mediastinal lymphadenopathy. The heart and pericardium demonstrate no acute abnormality. LUNGS AND PLEURA: The lungs are without acute process. No focal consolidation or pulmonary edema. No evidence of pleural effusion or pneumothorax. THORACIC BONES AND SOFT TISSUES: No acute bone or soft tissue abnormality. ABDOMEN AND PELVIS: LIVER: The liver is unremarkable. GALLBLADDER AND BILE DUCTS: Cholelithiasis. No evidence of acute cholecystitis. No biliary ductal dilatation. SPLEEN: The spleen is unremarkable. PANCREAS: The pancreas is unremarkable. ADRENAL GLANDS: Adrenal nodule is stable since 2016 and benign. No follow up recommended. KIDNEYS, URETERS AND BLADDER: No stones in the kidneys or ureters. No hydronephrosis. No perinephric or periureteral stranding. Urinary bladder is unremarkable. GI AND BOWEL:  Stomach and duodenal sweep demonstrate no acute abnormality. There is no bowel obstruction. No abnormal bowel wall thickening or distension. REPRODUCTIVE: Reproductive organs are unremarkable. PERITONEUM AND  RETROPERITONEUM: No ascites or free air. LYMPH NODES: No lymphadenopathy. ABDOMINAL BONES AND SOFT TISSUES: No acute abnormality of the bones. No acute soft tissue abnormality. IMPRESSION: 1. Acute aortic type B dissection originating at the left subclavian artery and extending through the descending thoracic aorta to the origin of the celiac axis; maximal aortic arch diameter 4.9 cm. False lumen largely non-opacified. Aortic arch branch vessels, mesenteric arteries, and renal arteries arise from the true lumen. 2. Severe stenosis at the origin of the left renal artery due to calcified and noncalcified plaque. 3. 3.7 cm infrarenal abdominal aortic aneurysm. Recommend ultrasound follow-up in 3 years per Society for Vascular Surgery (2018). Critical value/emergent results were called by telephone at the time of interpretation on 11 / 8 / 25 at 1:40 pm to Dr. Zackowski, who verbally acknowledged these results. Electronically signed by: Norman Gatlin MD 06/27/2024 01:48 PM EST RP Workstation: [id number]   DG Chest 2 View Result Date: 06/27/2024 EXAM: 2 VIEW(S) XRAY OF THE CHEST 06/27/2024 12:02:00 PM COMPARISON: None available. CLINICAL HISTORY: CP, AMS FINDINGS: LUNGS AND PLEURA: No focal pulmonary opacity. No pulmonary edema. No pleural effusion. No pneumothorax. HEART AND MEDIASTINUM: Cardiomegaly. There is enlargement of the transverse aortic arch which corresponds to findings on a remote CT of the chest from 02/26/2022 when the arch measured 4.2 cm in transverse diameter. Aortic atherosclerotic calcifications. BONES AND SOFT TISSUES: No acute osseous abnormality. IMPRESSION: 1. Cardiomegaly. 2. Enlargement of the transverse aortic arch, which corresponds to dilated transverse aortic arch noted on remote CT from 02/26/2022. Recommend follow-up imaging with CTA of the chest for more definitive evaluation characterization. 3. Aortic atherosclerotic calcifications. Electronically signed by: Waddell Calk MD  06/27/2024 12:24 PM EST RP Workstation: [id number]    Microbiology: Results for orders placed or performed during the hospital encounter of 06/27/24  MRSA Next Gen by PCR, Nasal     Status: None   Collection Time: 06/27/24  2:10 PM  Result Value Ref Range Status   MRSA by PCR Next Gen NOT DETECTED NOT DETECTED Final    Comment: (NOTE) The GeneXpert MRSA Assay (FDA approved for NASAL specimens only), is one component of a comprehensive MRSA colonization surveillance program. It is not intended to diagnose MRSA infection nor to guide or monitor treatment for MRSA infections. Test performance is not FDA approved in patients less than 59 years old. Performed at [facility], [address], [place], [place] [id number]   Culture, blood (Routine X 2) w Reflex to ID Panel     Status: None (Preliminary result)   Collection Time: 07/02/24  5:42 PM   Specimen: BLOOD  Result Value Ref Range Status   Specimen Description BLOOD SITE NOT SPECIFIED  Final   Special Requests   Final    BOTTLES DRAWN AEROBIC AND ANAEROBIC Blood Culture adequate volume   Culture   Final    NO GROWTH < 24 HOURS Performed at [facility], [address] [place], [place] [address]    Report Status PENDING  Incomplete  Culture, blood (Routine X 2) w Reflex to ID Panel     Status: None (Preliminary result)   Collection Time: 07/02/24  5:42 PM   Specimen: BLOOD  Result Value Ref Range Status   Specimen Description BLOOD SITE NOT SPECIFIED  Final   Special Requests  Final    BOTTLES DRAWN AEROBIC AND ANAEROBIC Blood Culture adequate volume   Culture   Final    NO GROWTH < 24 HOURS Performed at [facility], [address] [place], [place] [address]    Report Status PENDING  Incomplete    Labs: CBC: Recent Labs  Lab 06/27/24 1144 06/28/24 0208 06/29/24 0217 06/30/24 0214 07/01/24 0234 07/02/24 0308 07/03/24 0226  WBC 9.1   < > 11.3* 10.7* 11.9* 12.6* 11.2*  NEUTROABS 4.3  --   --    --   --   --   --   HGB 14.3   < > 14.6 15.6* 14.2 13.4 12.8  HCT 46.0   < > 43.9 46.5* 41.7 40.0 38.3  MCV 94.5   < > 91.1 91.7 90.8 91.5 90.8  PLT 197   < > 192 185 170 181 171   < > = values in this interval not displayed.   Basic Metabolic Panel: Recent Labs  Lab 06/29/24 0217 06/30/24 0214 07/01/24 0234 07/02/24 0308 07/03/24 0226  NA 136 137 137 135 137  K 3.5 3.8 3.8 3.5 3.8  CL 104 106 100 100 103  CO2 23 21* 25 26 22   GLUCOSE 112* 104* 105* 111* 112*  BUN 6* 10 14 14 14   CREATININE 0.91 0.96 1.43* 1.50* 1.00  CALCIUM 9.3 9.6 9.4 9.3 9.1  MG  --   --  2.0  --   --    Liver Function Tests: Recent Labs  Lab 06/27/24 1144  AST 20  ALT 14  ALKPHOS 96  BILITOT 0.5  PROT 7.3  ALBUMIN  3.8   CBG: Recent Labs  Lab 07/02/24 1125 07/02/24 1610 07/02/24 2123 07/03/24 0621 07/03/24 1157  GLUCAP 100* 133* 131* 104* 119*    Discharge time spent: greater than 30 minutes.  This record has been created using Conservation officer, historic buildings. Errors have been sought and corrected,but may not always be located. Such creation errors do not reflect on the standard of care.   Signed: Amaryllis Dare, MD [facility] 07/03/2024

## 2024-07-03 NOTE — Plan of Care (Signed)
  Problem: Activity: Goal: Risk for activity intolerance will decrease Outcome: Progressing   Problem: Coping: Goal: Level of anxiety will decrease Outcome: Progressing   Problem: Elimination: Goal: Will not experience complications related to urinary retention Outcome: Progressing   Problem: Safety: Goal: Ability to remain free from injury will improve Outcome: Progressing   

## 2024-07-06 ENCOUNTER — Other Ambulatory Visit: Payer: Self-pay

## 2024-07-06 DIAGNOSIS — I7102 Dissection of abdominal aorta: Secondary | ICD-10-CM

## 2024-07-07 LAB — CULTURE, BLOOD (ROUTINE X 2)
Culture: NO GROWTH
Culture: NO GROWTH
Special Requests: ADEQUATE
Special Requests: ADEQUATE

## 2024-08-17 ENCOUNTER — Ambulatory Visit (HOSPITAL_COMMUNITY)
Admission: RE | Admit: 2024-08-17 | Discharge: 2024-08-17 | Disposition: A | Discharge status: Home / Self Care | Source: Ambulatory Visit | Attending: Vascular Surgery | Admitting: Vascular Surgery

## 2024-08-17 DIAGNOSIS — I7102 Dissection of abdominal aorta: Secondary | ICD-10-CM | POA: Insufficient documentation

## 2024-08-17 MED ORDER — IOHEXOL 350 MG/ML SOLN
100.0000 mL | Freq: Once | INTRAVENOUS | Status: AC | PRN
  Administered 2024-08-17: 100 mL via INTRAVENOUS

## 2024-08-18 ENCOUNTER — Encounter: Payer: Self-pay | Admitting: Vascular Surgery

## 2024-08-18 ENCOUNTER — Ambulatory Visit: Attending: Vascular Surgery | Admitting: Vascular Surgery

## 2024-08-18 VITALS — BP 85/60 | HR 68 | Temp 97.9°F

## 2024-08-18 DIAGNOSIS — I71019 Dissection of thoracic aorta, unspecified: Secondary | ICD-10-CM | POA: Diagnosis not present

## 2024-08-18 NOTE — Progress Notes (Signed)
 [facility] [place]  ASSESSMENT / PLAN: 78 y.o. female with subacute type B aortic dissection which has resolved on recent CT angiogram.  Formal read is pending.  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  Plan follow-up in 1 year with repeat CT angiogram of the chest.  CHIEF COMPLAINT: follow up [facility]AD  HISTORY OF PRESENT ILLNESS: Jennifer Jacobs is a 78 y.o. female who returns to clinic after type B aortic dissection hospitalization.  She did well and was managed especially medically initially.  She is now in the subacute phase.  Follow-up CT scan was performed prior to my evaluation and shows improvement in the appearance of the contour of the aorta.  I reviewed this with her in detail.  She has no complaints today in the office.  Past Medical History:  Diagnosis Date   Acute blood loss anemia    AKI (acute kidney injury) 01/11/2018   Alcohol abuse 07/05/2014   Allergy    Anxiety    Anxiety and depression    Arthritis    Coagulation defect 09/19/2023   Cognitive deficit due to recent stroke 11/22/2016   COPD (chronic obstructive pulmonary disease) (HCC)    Depression    Dyslipidemia    Fibromyalgia    Hepatic cyst    Hyperlipidemia    Hypertension    Leukocytosis    Liver cyst 03/18/2014   01/26/2014 ultrasound Hypoechoic structure adjacent to gallbladder right lobe liver,  measuring 29 x 18 x 17 mm, possibly septated cyst     MI (myocardial infarction) (HCC) 2007   By patient report, not substantiated by recent nuclear stress test.   Neuromuscular disorder (HCC)    Seizure disorder (HCC)    none since 2002   Seizure prophylaxis    Seizures (HCC)    Smoker unmotivated to quit    Quit for 3 years and then restarted   Substance abuse (HCC)    ETOH   Vision abnormalities     Past Surgical History:  Procedure Laterality Date   CATARACT EXTRACTION W/PHACO Right 06/30/2018    Procedure: CATARACT EXTRACTION PHACO AND INTRAOCULAR LENS PLACEMENT RIGHT EYE;  Surgeon: Perley Hamilton, MD;  Location: [facility];  Service: Ophthalmology;  Laterality: Right;  CDE: 9.90   CATARACT EXTRACTION W/PHACO Left 07/14/2018   Procedure: CATARACT EXTRACTION PHACO AND INTRAOCULAR LENS PLACEMENT (IOC);  Surgeon: Perley Hamilton, MD;  Location: [facility];  Service: Ophthalmology;  Laterality: Left;  CDE: 6.96   CRANIOTOMY Left 11/19/2016   Procedure: CRANIOTOMY INTRACRANIAL FOR  ANEURYSM CLIPPING;  Surgeon: Morene Hicks Ditty, MD;  Location: [facility];  Service: Neurosurgery;  Laterality: Left;   ELBOW ARTHROPLASTY Left    rods   Exercise tolerance test  04/27/2013   CPET-MET: Submaximal effort (0.96, with goal of greater than 1.09) -- patient states that her effort was limited due to knee pain.  This makes the rest of the interpretation difficult: Peak VO2 - 10.5 (59% moderate), peak 02-pulse -  7..11 (low); heart rate 114 (73% -chronic incompetence considered);; PFTs normal    MOUTH SURGERY     NM MYOVIEW  LTD  05/19/2013   LexiScan : EF 80%, no evidence of ischemia or infarction   TRANSTHORACIC ECHOCARDIOGRAM  03/25/2013   EF 55-60%, no regional wall motion abnormalities; normal diastolic parameters, normal pulmonary pressures.  No valvular lesions noted.  Essentially normal echo     Family History  Problem Relation Age  of Onset   Heart attack Father 74   Hypertension Father    Alcohol abuse Father    Heart attack Maternal Grandmother    Cancer Maternal Grandfather        type unknown   Cancer Paternal Grandmother        type unknown   Depression Mother    Alcohol abuse Mother    Ovarian cancer Paternal Aunt    Cancer Paternal Aunt        ovarian   Prostate cancer Paternal Uncle    Stomach cancer Paternal Aunt    Diabetes Daughter    Heart disease Daughter 23       heart attack   Depression Sister    Alcohol abuse Brother    Depression Sister    Alcohol abuse Sister    Depression Sister     Alcohol abuse Sister    Alcohol abuse Brother    Alcohol abuse Son     Social History   Socioeconomic History   Marital status: Divorced    Spouse name: Not on file   Number of children: 4   Years of education: 9   Highest education level: Not on file  Occupational History   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  Tobacco Use   Smoking status: Former    Current packs/day: 0.50    Average packs/day: 0.5 packs/day for 20.0 years (10.0 ttl pk-yrs)    Types: Cigarettes   Smokeless tobacco: Never   Tobacco comments:    one pack every 3 months  Vaping Use   Vaping status: Never Used  Substance and Sexual Activity   Alcohol use: Not Currently    Comment: occasionally   Drug use: Yes    Frequency: 2.0 times per week    Types: Marijuana    Comment: last smoked 1 week ago   Sexual activity: Not Currently    Partners: Male    Birth control/protection: None  Other Topics Concern   Not on file  Social History Narrative   Lives alone with dog   Works as a Pharmacist, Hospital    Does drink about 5 alcoholic beverages a week.            Husband: Jerel - divorced   Daughters: Garen Mink (passed away) , [employer]   Social Drivers of Health   Tobacco Use: Medium Risk (08/18/2024)   Patient History    Smoking Tobacco Use: Former    Smokeless Tobacco Use: Never    Passive Exposure: Not on Actuary Strain: Not on file  Food Insecurity: No Food Insecurity (06/27/2024)   Epic    Worried About Programme Researcher, Broadcasting/film/video in the Last Year: Never true    Ran Out of Food in the Last Year: Never true  Transportation Needs: No Transportation Needs (06/27/2024)   Epic    Lack of Transportation (Medical): No    Lack of Transportation (Non-Medical): No  Physical Activity: Not on file  Stress: Not on file  Social Connections: Moderately Integrated (06/27/2024)   Social Connection and Isolation Panel    Frequency of Communication with Friends and Family: More  than three times a week    Frequency of Social Gatherings with Friends and Family: More than three times a week    Attends Religious Services: 1 to 4 times per year    Active Member of [employer] or Organizations: Yes    Attends Club or  Organization Meetings: 1 to 4 times per year    Marital Status: Divorced  Intimate Partner Violence: Not At Risk (06/27/2024)   Epic    Fear of Current or Ex-Partner: No    Emotionally Abused: No    Physically Abused: No    Sexually Abused: No  Depression (PHQ2-9): Not on file  Alcohol Screen: Not on file  Housing: Low Risk (06/27/2024)   Epic    Unable to Pay for Housing in the Last Year: No    Number of Times Moved in the Last Year: 0    Homeless in the Last Year: No  Utilities: Not At Risk (06/27/2024)   Epic    Threatened with loss of utilities: No  Health Literacy: Not on file    Allergies[1]  Current Outpatient Medications  Medication Sig Dispense Refill   acetaminophen  (TYLENOL ) 325 MG tablet Take 650 mg by mouth 2 (two) times daily as needed for headache or fever (pain).     albuterol  (VENTOLIN  HFA) 108 (90 Base) MCG/ACT inhaler Inhale into the lungs every 6 (six) hours as needed for wheezing or shortness of breath.     amLODipine  (NORVASC ) 10 MG tablet Take 1 tablet (10 mg total) by mouth daily. 30 tablet 1   ANORO ELLIPTA  62.5-25 MCG/ACT AEPB Inhale 1 puff into the lungs daily.     [Paused] apixaban  (ELIQUIS ) 5 MG TABS tablet Take 5 mg by mouth in the morning and at bedtime.     carvedilol  (COREG ) 25 MG tablet Take 1 tablet (25 mg total) by mouth 2 (two) times daily. 60 tablet 1   Cyanocobalamin (VITAMIN B-12 PO) Take 1 tablet by mouth daily.     dextromethorphan-guaiFENesin  (MUCINEX  DM) 30-600 MG 12hr tablet Take 1 tablet by mouth 2 (two) times daily. 30 tablet 0   escitalopram  (LEXAPRO ) 20 MG tablet Take 1 tablet (20 mg total) by mouth 2 (two) times daily. (Patient taking differently: Take 20 mg by mouth daily.) 60 tablet 2   furosemide   (LASIX ) 20 MG tablet Take 20 mg by mouth daily as needed for fluid.     gabapentin  (NEURONTIN ) 800 MG tablet Take 800 mg by mouth in the morning, at noon, and at bedtime.     GEMTESA 75 MG TABS Take 75 mg by mouth daily.     HYDROcodone -acetaminophen  (NORCO/VICODIN) 5-325 MG tablet Take 1 tablet by mouth 2 (two) times daily as needed for severe pain (pain score 7-10).     ipratropium-albuterol  (DUONEB) 0.5-2.5 (3) MG/3ML SOLN Take 3 mLs by nebulization every 6 (six) hours as needed (wheezing, shortess of breath).     irbesartan  (AVAPRO ) 300 MG tablet Take 1 tablet (300 mg total) by mouth at bedtime. 30 tablet 1   KLOXXADO  8 MG/0.1ML LIQD Place 1 spray into both nostrils as needed (opioid reversal).     levETIRAcetam  (KEPPRA ) 500 MG tablet Take 500 mg by mouth in the morning and at bedtime.     methocarbamol  (ROBAXIN ) 500 MG tablet Take 1 tablet (500 mg total) by mouth 2 (two) times daily as needed for muscle spasms. (Patient taking differently: Take 500 mg by mouth 3 (three) times daily as needed for muscle spasms.) 20 tablet 0   risperiDONE  (RISPERDAL ) 0.5 MG tablet Take 1 tablet (0.5 mg total) by mouth at bedtime. 90 tablet 2   rosuvastatin  (CRESTOR ) 10 MG tablet Take 10 mg by mouth at bedtime.     traZODone  (DESYREL ) 50 MG tablet Take 1 tablet (50 mg total) by  mouth at bedtime. 90 tablet 2   No current facility-administered medications for this visit.    PHYSICAL EXAM Vitals:   08/18/24 1321  BP: (!) 85/60  Pulse: 68  Temp: 97.9 F (36.6 C)  SpO2: 93%   No acute distress Regular rate and rhythm Unlabored breathing   PERTINENT LABORATORY AND RADIOLOGIC DATA  Most recent CBC    Latest Ref Rng & Units 07/03/2024    2:26 AM 07/02/2024    3:08 AM 07/01/2024    2:34 AM  CBC  WBC 4.0 - 10.5 K/uL 11.2  12.6  11.9   Hemoglobin 12.0 - 15.0 g/dL 87.1  86.5  85.7   Hematocrit 36.0 - 46.0 % 38.3  40.0  41.7   Platelets 150 - 400 K/uL 171  181  170      Most recent CMP    Latest  Ref Rng & Units 07/03/2024    2:26 AM 07/02/2024    3:08 AM 07/01/2024    2:34 AM  CMP  Glucose 70 - 99 mg/dL 887  888  894   BUN 8 - 23 mg/dL 14  14  14    Creatinine 0.44 - 1.00 mg/dL 8.99  8.49  8.56   Sodium 135 - 145 mmol/L 137  135  137   Potassium 3.5 - 5.1 mmol/L 3.8  3.5  3.8   Chloride 98 - 111 mmol/L 103  100  100   CO2 22 - 32 mmol/L 22  26  25    Calcium  8.9 - 10.3 mg/dL 9.1  9.3  9.4     Renal function CrCl cannot be calculated (Patient's most recent lab result is older than the maximum 21 days allowed.).  Hgb A1c MFr Bld (%)  Date Value  06/28/2024 5.7 (H)   CT angiogram formal read is not yet complete.  Personally reviewed in detail.  I read a interval improvement in the contour of the descending thoracic aorta.  There is aneurysmal degeneration proximally in the descending thoracic weeks extending into the arch.  The aneurysm does not yet meet size criteria for repair.   Debby SAILOR. Magda, MD FACS [facility] Phone Number: [phone] 08/18/2024 9:34 PM   Total time spent on preparing this encounter including chart review, data review, collecting history, examining the patient, and coordinating care: 30 min  Portions of this report may have been transcribed using voice recognition software.  Every effort has been made to ensure accuracy; however, inadvertent computerized transcription errors may still be present.       [1]  Allergies Allergen Reactions   Bayer Aspirin  [Aspirin ] Nausea Only   Prozac  [Fluoxetine  Hcl] Other (See Comments)    Headaches

## 2024-08-19 LAB — POCT I-STAT CREATININE: Creatinine, Ser: 0.9 mg/dL (ref 0.44–1.00)

## 2024-08-24 ENCOUNTER — Ambulatory Visit: Admitting: Podiatry
